# Patient Record
Sex: Female | Born: 1970
Health system: Southern US, Community
[De-identification: ages and names within clinical notes are randomized; demographics above are authoritative.]

## PROBLEM LIST (undated history)

## (undated) ENCOUNTER — Inpatient Hospital Stay (HOSPITAL_COMMUNITY): Payer: Self-pay

## (undated) DIAGNOSIS — Z8632 Personal history of gestational diabetes: Secondary | ICD-10-CM

## (undated) DIAGNOSIS — C801 Malignant (primary) neoplasm, unspecified: Secondary | ICD-10-CM

## (undated) DIAGNOSIS — N289 Disorder of kidney and ureter, unspecified: Secondary | ICD-10-CM

## (undated) DIAGNOSIS — IMO0002 Reserved for concepts with insufficient information to code with codable children: Secondary | ICD-10-CM

## (undated) DIAGNOSIS — D689 Coagulation defect, unspecified: Secondary | ICD-10-CM

## (undated) DIAGNOSIS — I2699 Other pulmonary embolism without acute cor pulmonale: Secondary | ICD-10-CM

## (undated) DIAGNOSIS — O24419 Gestational diabetes mellitus in pregnancy, unspecified control: Secondary | ICD-10-CM

## (undated) DIAGNOSIS — E559 Vitamin D deficiency, unspecified: Secondary | ICD-10-CM

## (undated) DIAGNOSIS — D251 Intramural leiomyoma of uterus: Secondary | ICD-10-CM

## (undated) DIAGNOSIS — A048 Other specified bacterial intestinal infections: Secondary | ICD-10-CM

## (undated) DIAGNOSIS — K649 Unspecified hemorrhoids: Secondary | ICD-10-CM

## (undated) DIAGNOSIS — K5900 Constipation, unspecified: Secondary | ICD-10-CM

## (undated) DIAGNOSIS — R87619 Unspecified abnormal cytological findings in specimens from cervix uteri: Secondary | ICD-10-CM

## (undated) HISTORY — DX: Intramural leiomyoma of uterus: D25.1

## (undated) HISTORY — DX: Coagulation defect, unspecified: D68.9

## (undated) HISTORY — PX: CHOLECYSTECTOMY: SHX55

## (undated) HISTORY — DX: Other pulmonary embolism without acute cor pulmonale: I26.99

## (undated) HISTORY — DX: Constipation, unspecified: K59.00

## (undated) HISTORY — DX: Unspecified hemorrhoids: K64.9

## (undated) HISTORY — DX: Vitamin D deficiency, unspecified: E55.9

## (undated) HISTORY — DX: Other specified bacterial intestinal infections: A04.8

## (undated) HISTORY — DX: Personal history of gestational diabetes: Z86.32

---

## 2000-04-03 ENCOUNTER — Encounter: Payer: Self-pay | Admitting: Emergency Medicine

## 2000-04-03 ENCOUNTER — Emergency Department (HOSPITAL_COMMUNITY): Admission: EM | Admit: 2000-04-03 | Discharge: 2000-04-03 | Payer: Self-pay | Admitting: Emergency Medicine

## 2000-05-12 ENCOUNTER — Inpatient Hospital Stay (HOSPITAL_COMMUNITY): Admission: AD | Admit: 2000-05-12 | Discharge: 2000-05-12 | Payer: Self-pay | Admitting: Obstetrics & Gynecology

## 2000-05-12 ENCOUNTER — Encounter: Payer: Self-pay | Admitting: Obstetrics & Gynecology

## 2000-11-07 ENCOUNTER — Encounter: Payer: Self-pay | Admitting: *Deleted

## 2000-11-07 ENCOUNTER — Inpatient Hospital Stay (HOSPITAL_COMMUNITY): Admission: AD | Admit: 2000-11-07 | Discharge: 2000-11-09 | Payer: Self-pay | Admitting: *Deleted

## 2002-03-12 ENCOUNTER — Emergency Department (HOSPITAL_COMMUNITY): Admission: EM | Admit: 2002-03-12 | Discharge: 2002-03-12 | Payer: Self-pay | Admitting: Emergency Medicine

## 2002-05-19 ENCOUNTER — Emergency Department (HOSPITAL_COMMUNITY): Admission: EM | Admit: 2002-05-19 | Discharge: 2002-05-19 | Payer: Self-pay | Admitting: Emergency Medicine

## 2002-07-13 ENCOUNTER — Emergency Department (HOSPITAL_COMMUNITY): Admission: EM | Admit: 2002-07-13 | Discharge: 2002-07-13 | Payer: Self-pay | Admitting: Emergency Medicine

## 2002-07-28 ENCOUNTER — Emergency Department (HOSPITAL_COMMUNITY): Admission: EM | Admit: 2002-07-28 | Discharge: 2002-07-28 | Payer: Self-pay | Admitting: Emergency Medicine

## 2002-10-31 ENCOUNTER — Inpatient Hospital Stay (HOSPITAL_COMMUNITY): Admission: AD | Admit: 2002-10-31 | Discharge: 2002-10-31 | Payer: Self-pay | Admitting: Obstetrics & Gynecology

## 2002-11-05 ENCOUNTER — Encounter: Admission: RE | Admit: 2002-11-05 | Discharge: 2002-11-05 | Payer: Self-pay | Admitting: Obstetrics & Gynecology

## 2002-11-05 ENCOUNTER — Encounter: Payer: Self-pay | Admitting: Obstetrics & Gynecology

## 2003-07-29 ENCOUNTER — Encounter: Admission: RE | Admit: 2003-07-29 | Discharge: 2003-07-29 | Payer: Self-pay | Admitting: Obstetrics and Gynecology

## 2004-07-21 ENCOUNTER — Other Ambulatory Visit: Admission: RE | Admit: 2004-07-21 | Discharge: 2004-07-21 | Payer: Self-pay | Admitting: Gynecology

## 2006-04-20 ENCOUNTER — Other Ambulatory Visit: Admission: RE | Admit: 2006-04-20 | Discharge: 2006-04-20 | Payer: Self-pay | Admitting: Gynecology

## 2007-08-27 ENCOUNTER — Inpatient Hospital Stay (HOSPITAL_COMMUNITY): Admission: AD | Admit: 2007-08-27 | Discharge: 2007-08-27 | Payer: Self-pay | Admitting: Gynecology

## 2007-12-18 ENCOUNTER — Inpatient Hospital Stay (HOSPITAL_COMMUNITY): Admission: AD | Admit: 2007-12-18 | Discharge: 2007-12-19 | Payer: Self-pay | Admitting: Obstetrics

## 2007-12-31 ENCOUNTER — Inpatient Hospital Stay (HOSPITAL_COMMUNITY): Admission: AD | Admit: 2007-12-31 | Discharge: 2007-12-31 | Payer: Self-pay | Admitting: Obstetrics

## 2008-01-23 ENCOUNTER — Emergency Department (HOSPITAL_COMMUNITY): Admission: EM | Admit: 2008-01-23 | Discharge: 2008-01-23 | Payer: Self-pay | Admitting: Family Medicine

## 2008-01-26 ENCOUNTER — Emergency Department (HOSPITAL_COMMUNITY): Admission: EM | Admit: 2008-01-26 | Discharge: 2008-01-26 | Payer: Self-pay | Admitting: Emergency Medicine

## 2008-03-18 ENCOUNTER — Inpatient Hospital Stay (HOSPITAL_COMMUNITY): Admission: AD | Admit: 2008-03-18 | Discharge: 2008-03-20 | Payer: Self-pay | Admitting: Obstetrics

## 2008-11-12 ENCOUNTER — Emergency Department (HOSPITAL_COMMUNITY): Admission: EM | Admit: 2008-11-12 | Discharge: 2008-11-12 | Payer: Self-pay | Admitting: Family Medicine

## 2009-07-23 ENCOUNTER — Emergency Department (HOSPITAL_COMMUNITY): Admission: EM | Admit: 2009-07-23 | Discharge: 2009-07-23 | Payer: Self-pay | Admitting: Family Medicine

## 2010-03-01 ENCOUNTER — Emergency Department (HOSPITAL_COMMUNITY): Admission: EM | Admit: 2010-03-01 | Discharge: 2010-03-01 | Payer: Self-pay | Admitting: Emergency Medicine

## 2010-03-24 ENCOUNTER — Inpatient Hospital Stay (HOSPITAL_COMMUNITY)
Admission: AD | Admit: 2010-03-24 | Discharge: 2010-03-24 | Payer: Self-pay | Source: Home / Self Care | Admitting: Obstetrics and Gynecology

## 2010-03-30 ENCOUNTER — Encounter
Admission: RE | Admit: 2010-03-30 | Discharge: 2010-03-30 | Payer: Self-pay | Source: Home / Self Care | Attending: Obstetrics and Gynecology | Admitting: Obstetrics and Gynecology

## 2010-04-09 ENCOUNTER — Ambulatory Visit: Payer: Self-pay | Admitting: Obstetrics & Gynecology

## 2010-06-28 LAB — URINALYSIS, ROUTINE W REFLEX MICROSCOPIC
Bilirubin Urine: NEGATIVE
Glucose, UA: NEGATIVE mg/dL
Ketones, ur: 15 mg/dL — AB
Leukocytes, UA: NEGATIVE
Nitrite: NEGATIVE
Protein, ur: NEGATIVE mg/dL
Specific Gravity, Urine: 1.025 (ref 1.005–1.030)
Urobilinogen, UA: 0.2 mg/dL (ref 0.0–1.0)
pH: 6 (ref 5.0–8.0)

## 2010-06-28 LAB — URINE MICROSCOPIC-ADD ON

## 2010-06-28 LAB — POCT PREGNANCY, URINE: Preg Test, Ur: NEGATIVE

## 2010-11-18 ENCOUNTER — Inpatient Hospital Stay (INDEPENDENT_AMBULATORY_CARE_PROVIDER_SITE_OTHER)
Admission: RE | Admit: 2010-11-18 | Discharge: 2010-11-18 | Disposition: A | Discharge status: Home / Self Care | Payer: Self-pay | Source: Ambulatory Visit | Attending: Family Medicine | Admitting: Family Medicine

## 2010-11-18 DIAGNOSIS — L259 Unspecified contact dermatitis, unspecified cause: Secondary | ICD-10-CM

## 2010-12-24 ENCOUNTER — Inpatient Hospital Stay (INDEPENDENT_AMBULATORY_CARE_PROVIDER_SITE_OTHER)
Admission: RE | Admit: 2010-12-24 | Discharge: 2010-12-24 | Disposition: A | Discharge status: Home / Self Care | Payer: Self-pay | Source: Ambulatory Visit | Attending: Family Medicine | Admitting: Family Medicine

## 2010-12-24 DIAGNOSIS — J069 Acute upper respiratory infection, unspecified: Secondary | ICD-10-CM

## 2011-01-12 LAB — URINALYSIS, ROUTINE W REFLEX MICROSCOPIC
Bilirubin Urine: NEGATIVE
Glucose, UA: NEGATIVE
Ketones, ur: NEGATIVE
Nitrite: NEGATIVE
Protein, ur: NEGATIVE
Specific Gravity, Urine: 1.025
Urobilinogen, UA: 0.2
pH: 6

## 2011-01-12 LAB — CBC
HCT: 35.8 — ABNORMAL LOW
Hemoglobin: 12.4
MCHC: 34.4
MCV: 90.4
Platelets: 264
RBC: 3.96
RDW: 12.9
WBC: 10.6 — ABNORMAL HIGH

## 2011-01-12 LAB — WET PREP, GENITAL
Clue Cells Wet Prep HPF POC: NONE SEEN
Trich, Wet Prep: NONE SEEN
Yeast Wet Prep HPF POC: NONE SEEN

## 2011-01-12 LAB — URINE MICROSCOPIC-ADD ON

## 2011-01-12 LAB — GC/CHLAMYDIA PROBE AMP, GENITAL
Chlamydia, DNA Probe: NEGATIVE
GC Probe Amp, Genital: NEGATIVE

## 2011-01-12 LAB — POCT PREGNANCY, URINE
Operator id: 222261
Preg Test, Ur: POSITIVE

## 2011-01-17 LAB — POCT RAPID STREP A: Streptococcus, Group A Screen (Direct): NEGATIVE

## 2011-01-20 LAB — CBC
HCT: 32.3 % — ABNORMAL LOW (ref 36.0–46.0)
HCT: 39.6 % (ref 36.0–46.0)
Hemoglobin: 11.1 g/dL — ABNORMAL LOW (ref 12.0–15.0)
Hemoglobin: 13.2 g/dL (ref 12.0–15.0)
MCHC: 33.2 g/dL (ref 30.0–36.0)
MCHC: 34.2 g/dL (ref 30.0–36.0)
MCV: 90.3 fL (ref 78.0–100.0)
MCV: 90.5 fL (ref 78.0–100.0)
Platelets: 145 10*3/uL — ABNORMAL LOW (ref 150–400)
Platelets: 163 10*3/uL (ref 150–400)
RBC: 3.58 MIL/uL — ABNORMAL LOW (ref 3.87–5.11)
RBC: 4.38 MIL/uL (ref 3.87–5.11)
RDW: 16.6 % — ABNORMAL HIGH (ref 11.5–15.5)
RDW: 16.7 % — ABNORMAL HIGH (ref 11.5–15.5)
WBC: 10.9 10*3/uL — ABNORMAL HIGH (ref 4.0–10.5)
WBC: 11.9 10*3/uL — ABNORMAL HIGH (ref 4.0–10.5)

## 2011-01-20 LAB — RPR: RPR Ser Ql: NONREACTIVE

## 2011-03-09 ENCOUNTER — Inpatient Hospital Stay (HOSPITAL_COMMUNITY)
Admission: AD | Admit: 2011-03-09 | Discharge: 2011-03-09 | Disposition: A | Discharge status: Home / Self Care | Payer: Self-pay | Source: Ambulatory Visit | Attending: Obstetrics and Gynecology | Admitting: Obstetrics and Gynecology

## 2011-03-09 ENCOUNTER — Encounter (HOSPITAL_COMMUNITY): Payer: Self-pay | Admitting: *Deleted

## 2011-03-09 DIAGNOSIS — N760 Acute vaginitis: Secondary | ICD-10-CM | POA: Insufficient documentation

## 2011-03-09 DIAGNOSIS — B373 Candidiasis of vulva and vagina: Secondary | ICD-10-CM

## 2011-03-09 DIAGNOSIS — N949 Unspecified condition associated with female genital organs and menstrual cycle: Secondary | ICD-10-CM | POA: Insufficient documentation

## 2011-03-09 HISTORY — DX: Reserved for concepts with insufficient information to code with codable children: IMO0002

## 2011-03-09 HISTORY — DX: Unspecified abnormal cytological findings in specimens from cervix uteri: R87.619

## 2011-03-09 LAB — URINE MICROSCOPIC-ADD ON

## 2011-03-09 LAB — WET PREP, GENITAL
Clue Cells Wet Prep HPF POC: NONE SEEN
Trich, Wet Prep: NONE SEEN
Yeast Wet Prep HPF POC: NONE SEEN

## 2011-03-09 LAB — URINALYSIS, ROUTINE W REFLEX MICROSCOPIC
Bilirubin Urine: NEGATIVE
Glucose, UA: NEGATIVE mg/dL
Ketones, ur: NEGATIVE mg/dL
Leukocytes, UA: NEGATIVE
Nitrite: NEGATIVE
Protein, ur: NEGATIVE mg/dL
Specific Gravity, Urine: >1.03 — ABNORMAL HIGH (ref 1.005–1.030)
Urobilinogen, UA: 0.2 mg/dL (ref 0.0–1.0)
pH: 5.5 (ref 5.0–8.0)

## 2011-03-09 LAB — POCT PREGNANCY, URINE: Preg Test, Ur: NEGATIVE

## 2011-03-09 MED ORDER — FLUCONAZOLE 150 MG PO TABS: 150.0000 mg | ORAL_TABLET | Freq: Once | ORAL | Status: AC

## 2011-03-09 NOTE — Progress Notes (Signed)
Denies n/v/d/c.  Some vaginal itching.no pain with urination.

## 2011-03-09 NOTE — ED Provider Notes (Signed)
History     Chief Complaint  Patient presents with  . Vaginal Pain   HPI 40 y.o. female with vaginal pain and itching x several days. Tried Monistat 1 day course, helped a little, but symptoms returned.   OB History    Grav Para Term Preterm Abortions TAB SAB Ect Mult Living   4 4 4       4       Past Medical History  Diagnosis Date  . Asthma   . Abnormal Pap smear     patient states she had cancer that was removed from her cervix    History reviewed. No pertinent past surgical history.  History reviewed. No pertinent family history.  History  Substance Use Topics  . Smoking status: Never Smoker   . Smokeless tobacco: Never Used  . Alcohol Use: Yes    Allergies: No Known Allergies  Prescriptions prior to admission  Medication Sig Dispense Refill  . acetaminophen (TYLENOL) 500 MG tablet Take 500 mg by mouth every 6 (six) hours as needed. For pain         Review of Systems  Constitutional: Negative.   Respiratory: Negative.   Cardiovascular: Negative.   Gastrointestinal: Negative for nausea, vomiting, abdominal pain, diarrhea and constipation.  Genitourinary: Negative for dysuria, urgency, frequency, hematuria and flank pain.       Negative for vaginal bleeding, Positive for vaginal discharge and pain  Musculoskeletal: Negative.   Neurological: Negative.   Psychiatric/Behavioral: Negative.    Physical Exam   Blood pressure 128/95, pulse 63, temperature 99.1 F (37.3 C), temperature source Oral, resp. rate 20, height 5\' 6"  (1.676 m), weight 90.266 kg (199 lb), last menstrual period 02/17/2011.  Physical Exam  Nursing note and vitals reviewed. Constitutional: She is oriented to person, place, and time. She appears well-developed and well-nourished. No distress.  Respiratory: Effort normal.  GI: Soft. She exhibits no distension. There is no tenderness.  Genitourinary: Uterus is not enlarged and not tender. Cervix exhibits no discharge. Right adnexum displays no  mass and no tenderness. Left adnexum displays no mass and no tenderness. No bleeding around the vagina. Vaginal discharge (white) found.  Musculoskeletal: Normal range of motion.  Neurological: She is alert and oriented to person, place, and time.  Skin: Skin is warm and dry.  Psychiatric: She has a normal mood and affect.    MAU Course  Procedures  Results for orders placed during the hospital encounter of 03/09/11 (from the past 24 hour(s))  URINALYSIS, ROUTINE W REFLEX MICROSCOPIC     Status: Abnormal   Collection Time   03/09/11  6:00 PM      Component Value Range   Color, Urine YELLOW  YELLOW    Appearance CLEAR  CLEAR    Specific Gravity, Urine >1.030 (*) 1.005 - 1.030    pH 5.5  5.0 - 8.0    Glucose, UA NEGATIVE  NEGATIVE (mg/dL)   Hgb urine dipstick TRACE (*) NEGATIVE    Bilirubin Urine NEGATIVE  NEGATIVE    Ketones, ur NEGATIVE  NEGATIVE (mg/dL)   Protein, ur NEGATIVE  NEGATIVE (mg/dL)   Urobilinogen, UA 0.2  0.0 - 1.0 (mg/dL)   Nitrite NEGATIVE  NEGATIVE    Leukocytes, UA NEGATIVE  NEGATIVE   URINE MICROSCOPIC-ADD ON     Status: Abnormal   Collection Time   03/09/11  6:00 PM      Component Value Range   Squamous Epithelial / LPF FEW (*) RARE    WBC, UA  3-6  <3 (WBC/hpf)   Bacteria, UA FEW (*) RARE   POCT PREGNANCY, URINE     Status: Normal   Collection Time   03/09/11  6:44 PM      Component Value Range   Preg Test, Ur NEGATIVE    WET PREP, GENITAL     Status: Abnormal   Collection Time   03/09/11 10:37 PM      Component Value Range   Yeast, Wet Prep NONE SEEN  NONE SEEN    Trich, Wet Prep NONE SEEN  NONE SEEN    Clue Cells, Wet Prep NONE SEEN  NONE SEEN    WBC, Wet Prep HPF POC FEW (*) NONE SEEN      Assessment and Plan  Vaginitis - will treat for candida considering symptoms and partial resolution of symptoms with 1 day monistat, rx diflucan Encouraged f/u in pap clinic d/t history of abnormal pap - no pap in 2 years  Hendy Brindle 03/09/2011,  10:36 PM

## 2011-03-09 NOTE — Progress Notes (Signed)
Pain in vagina, started about 3 wks ago.  Cramping.. No bleeding, small amt of clear discharge.

## 2011-03-10 ENCOUNTER — Encounter (HOSPITAL_COMMUNITY): Payer: Self-pay | Admitting: Advanced Practice Midwife

## 2011-03-10 LAB — GC/CHLAMYDIA PROBE AMP, GENITAL
Chlamydia, DNA Probe: NEGATIVE
GC Probe Amp, Genital: NEGATIVE

## 2011-03-14 NOTE — ED Provider Notes (Signed)
Attestation of Attending Supervision of Advanced Practitioner: Evaluation and management procedures were performed by the PA/NP/CNM/OB Fellow under my supervision/collaboration. Chart reviewed and agree with management and plan.  Tilda Burrow 03/14/2011 6:35 PM

## 2011-04-11 ENCOUNTER — Encounter (HOSPITAL_COMMUNITY): Payer: Self-pay | Admitting: *Deleted

## 2011-04-11 ENCOUNTER — Emergency Department (INDEPENDENT_AMBULATORY_CARE_PROVIDER_SITE_OTHER)
Admission: EM | Admit: 2011-04-11 | Discharge: 2011-04-11 | Disposition: A | Discharge status: Home / Self Care | Payer: Self-pay | Source: Home / Self Care | Attending: Emergency Medicine | Admitting: Emergency Medicine

## 2011-04-11 DIAGNOSIS — Z77098 Contact with and (suspected) exposure to other hazardous, chiefly nonmedicinal, chemicals: Secondary | ICD-10-CM

## 2011-04-11 DIAGNOSIS — R059 Cough, unspecified: Secondary | ICD-10-CM

## 2011-04-11 DIAGNOSIS — R05 Cough: Secondary | ICD-10-CM

## 2011-04-11 MED ORDER — ARTIFICIAL TEAR OINTMENT OP OINT: 1.0000 [drp] | TOPICAL_OINTMENT | Freq: Three times a day (TID) | OPHTHALMIC | Status: DC

## 2011-04-11 MED ORDER — PREDNISONE 20 MG PO TABS: 40.0000 mg | ORAL_TABLET | Freq: Every day | ORAL | Status: AC

## 2011-04-11 NOTE — ED Notes (Signed)
Pt  Reports  Sinus  Pressure     Sinus  /  Nose  Stuffy  And  painfull  Eyes  Hurt  When she bends       She  Has  Been  Instilling  Eye  Drops  For the  Symptoms  As  Well    -  The  Pt  Reports  The  Symptoms  X   3  Days

## 2011-04-11 NOTE — ED Notes (Signed)
Pt only speaks spanish

## 2011-04-11 NOTE — ED Provider Notes (Signed)
History     CSN: [id number]  Arrival date & time 04/11/11  1056   First MD Initiated Contact with Patient 04/11/11 1121      Chief Complaint  Patient presents with  . Facial Pain    (Consider location/radiation/quality/duration/timing/severity/associated sxs/prior treatment) HPI Comments: Chest feels sore, been coughing a lot, by accident I inhaled Clorox and started coughing a lot, that was as 3 days ago" the cough is better, NO SOB No fevers. No congestion "but Im sore all over my upper back and chest" also My r eye was feeling sore, its better today but tears a bit"  The history is provided by the patient.    Past Medical History  Diagnosis Date  . Asthma   . Abnormal Pap smear     patient states she had cancer that was removed from her cervix    History reviewed. No pertinent past surgical history.  History reviewed. No pertinent family history.  History  Substance Use Topics  . Smoking status: Never Smoker   . Smokeless tobacco: Never Used  . Alcohol Use: Yes    OB History    Grav Para Term Preterm Abortions TAB SAB Ect Mult Living   4 4 4       4       Review of Systems  Constitutional: Negative for fever and fatigue.  HENT: Negative for neck stiffness.   Eyes: Positive for discharge and itching. Negative for photophobia, redness and visual disturbance.  Respiratory: Positive for cough. Negative for apnea, chest tightness and wheezing.   Cardiovascular: Negative for palpitations and leg swelling.    Allergies  Review of patient's allergies indicates no known allergies.  Home Medications   Current Outpatient Rx  Name Route Sig Dispense Refill  . ACETAMINOPHEN 500 MG PO TABS Oral Take 500 mg by mouth every 6 (six) hours as needed. For pain     . ARTIFICIAL TEAR OINTMENT OP OINT Ophthalmic Apply 1 drop to eye 3 (three) times daily. 1 each 0  . PREDNISONE 20 MG PO TABS Oral Take 2 tablets (40 mg total) by mouth daily. 10 tablet 0    BP 101/64  Pulse  68  Temp(Src) 98.2 F (36.8 C) (Oral)  Resp 20  SpO2 100%  LMP 04/10/2011  Physical Exam  Nursing note and vitals reviewed. Constitutional: She appears well-developed and well-nourished. No distress.  HENT:  Head: Normocephalic.  Mouth/Throat: Oropharyngeal exudate present.  Eyes: Conjunctivae and EOM are normal. Pupils are equal, round, and reactive to light. No foreign bodies found. Right eye exhibits no chemosis, no discharge, no exudate and no hordeolum. No foreign body present in the right eye. Left eye exhibits no chemosis, no discharge, no exudate and no hordeolum. No foreign body present in the left eye. No scleral icterus.  Pulmonary/Chest: Breath sounds normal. No respiratory distress. She has no decreased breath sounds. She has no wheezes. She has no rhonchi. She has no rales.  Skin: Skin is intact. No abrasion noted.  Psychiatric: She has a normal mood and affect. Her speech is normal.    ED Course  Procedures (including critical care time)  Labs Reviewed - No data to display No results found.   1. Chemical exposure   2. Cough       MDM  Inhaled Clorox while cleaning, coughed a lot- presents with chest wall discomfort and R eye irritataion without visual changes-        Jimmie Molly, MD 04/11/11 1357

## 2011-04-11 NOTE — ED Notes (Signed)
Pt  Reports  A few  Days  Ago  Pt  Mat y  Have     Inhaled  And  Was  Irritated  By  A  Chemical  Cleaning       Fumes       Possibly  Causing  The  Eye  And  Nasal  Irritation

## 2011-09-05 ENCOUNTER — Emergency Department (INDEPENDENT_AMBULATORY_CARE_PROVIDER_SITE_OTHER): Payer: Self-pay

## 2011-09-05 ENCOUNTER — Encounter (HOSPITAL_COMMUNITY): Payer: Self-pay

## 2011-09-05 ENCOUNTER — Emergency Department (INDEPENDENT_AMBULATORY_CARE_PROVIDER_SITE_OTHER)
Admission: EM | Admit: 2011-09-05 | Discharge: 2011-09-05 | Disposition: A | Discharge status: Home / Self Care | Payer: Self-pay | Source: Home / Self Care | Attending: Family Medicine | Admitting: Family Medicine

## 2011-09-05 DIAGNOSIS — S93401A Sprain of unspecified ligament of right ankle, initial encounter: Secondary | ICD-10-CM

## 2011-09-05 DIAGNOSIS — S93409A Sprain of unspecified ligament of unspecified ankle, initial encounter: Secondary | ICD-10-CM

## 2011-09-05 MED ORDER — TRAMADOL HCL 50 MG PO TABS: 50.0000 mg | ORAL_TABLET | Freq: Four times a day (QID) | ORAL | Status: AC | PRN

## 2011-09-05 MED ORDER — TRAMADOL HCL 50 MG PO TABS: 50.0000 mg | ORAL_TABLET | Freq: Four times a day (QID) | ORAL | Status: DC | PRN

## 2011-09-05 MED ORDER — IBUPROFEN 600 MG PO TABS: 600.0000 mg | ORAL_TABLET | Freq: Three times a day (TID) | ORAL | Status: AC

## 2011-09-05 NOTE — Discharge Instructions (Signed)
No fracturas en sus rayos X. Tome los medicamentos como se le ha indicado. Tome ibuprofeno con las comidas pues puede irritar su estomago. Tramadol puede darle sueo y usted no debe manejar despues de tomar [employer]. Mantenga el brazalete quiteselo para dormir.  Empiece a hacer los ejercicios de rehabilitacion tan pronto como mejore su dolor. Vaya con el orthopeda numero arriba si dolor persistent despues de Teacher, English as a foreign language.

## 2011-09-05 NOTE — ED Notes (Signed)
Pt states she was walking and twisted her rt ankle this am.  C/o pain in rt ankle and to plantar surface of rt heel.

## 2011-09-06 NOTE — ED Provider Notes (Signed)
History     CSN: [id number]  Arrival date & time 09/05/11  4098   First MD Initiated Contact with Patient 09/05/11 1950      Chief Complaint  Patient presents with  . Foot Pain    (Consider location/radiation/quality/duration/timing/severity/associated sxs/prior treatment) HPI Comments: 41 y/o female here c/o right ankle pain after twisting ankle in valgus position while walking earlier today. Has noted swelling and ankle and heel painful to put weight on it.    Past Medical History  Diagnosis Date  . Asthma   . Abnormal Pap smear     patient states she had cancer that was removed from her cervix    History reviewed. No pertinent past surgical history.  No family history on file.  History  Substance Use Topics  . Smoking status: Never Smoker   . Smokeless tobacco: Never Used  . Alcohol Use: No    OB History    Grav Para Term Preterm Abortions TAB SAB Ect Mult Living   4 4 4       4       Review of Systems  Constitutional:       10 systems reviewed and  pertinent negative and positive symptoms are as per HPI.     Musculoskeletal:       As per HPI  All other systems reviewed and are negative.    Allergies  Review of patient's allergies indicates no known allergies.  Home Medications   Current Outpatient Rx  Name Route Sig Dispense Refill  . ACETAMINOPHEN 500 MG PO TABS Oral Take 500 mg by mouth every 6 (six) hours as needed. For pain     . ARTIFICIAL TEAR OINTMENT OP OINT Ophthalmic Apply 1 drop to eye 3 (three) times daily. 1 each 0  . IBUPROFEN 600 MG PO TABS Oral Take 1 tablet (600 mg total) by mouth 3 (three) times daily. 30 tablet 0  . TRAMADOL HCL 50 MG PO TABS Oral Take 1 tablet (50 mg total) by mouth every 6 (six) hours as needed for pain. 15 tablet 0    BP 115/73  Pulse 56  Temp(Src) 97.9 F (36.6 C) (Oral)  Resp 16  SpO2 100%  LMP 08/22/2011  Physical Exam  Nursing note and vitals reviewed. Constitutional: She is oriented to person,  place, and time. She appears well-developed and well-nourished. No distress.  HENT:  Head: Normocephalic and atraumatic.  Cardiovascular: Normal heart sounds.   Pulmonary/Chest: Breath sounds normal.  Musculoskeletal:       Right ankle with mild swelling around lateral malleoli. No bruising or ecchymosis. Weight bearing but reported discomfort. Pain with palpation at heel pad. No swelling, redness, or wounds.   Neurological: She is alert and oriented to person, place, and time.    ED Course  Procedures (including critical care time)  Labs Reviewed - No data to display Dg Ankle Complete Right  09/05/2011  *RADIOLOGY REPORT*  Clinical Data: Larey Seat.  Twisted ankle.  Pain in the lateral ankle and bottom of foot.  RIGHT ANKLE - COMPLETE 3+ VIEW  Comparison: None.  Findings: There is no evidence for acute fracture or dislocation. No soft tissue foreign body or gas identified.  The mortise is intact. Note is made of a small plantar calcaneal spur.  IMPRESSION:  1. No evidence for acute  abnormality. 2.  Small plantar calcaneal spur  Original Report Authenticated By: Patterson Hammersmith, M.D.     1. Ankle sprain, right, initial encounter  MDM  Treated symptomatically. Ankle brace and rehab exercises instructions also provided.        Sharin Grave, MD 09/06/11 1459

## 2011-11-11 ENCOUNTER — Encounter: Payer: Self-pay | Admitting: Family Medicine

## 2011-11-11 ENCOUNTER — Ambulatory Visit (INDEPENDENT_AMBULATORY_CARE_PROVIDER_SITE_OTHER): Payer: Self-pay | Admitting: Family Medicine

## 2011-11-11 VITALS — BP 113/81 | HR 83 | Temp 98.0°F | Ht 60.75 in | Wt 188.5 lb

## 2011-11-11 DIAGNOSIS — Z8742 Personal history of other diseases of the female genital tract: Secondary | ICD-10-CM

## 2011-11-11 DIAGNOSIS — E669 Obesity, unspecified: Secondary | ICD-10-CM

## 2011-11-11 DIAGNOSIS — Z87898 Personal history of other specified conditions: Secondary | ICD-10-CM

## 2011-11-11 DIAGNOSIS — M25569 Pain in unspecified knee: Secondary | ICD-10-CM

## 2011-11-11 DIAGNOSIS — M25561 Pain in right knee: Secondary | ICD-10-CM | POA: Insufficient documentation

## 2011-11-11 MED ORDER — IBUPROFEN 600 MG PO TABS: 600.0000 mg | ORAL_TABLET | Freq: Three times a day (TID) | ORAL | Status: AC | PRN

## 2011-11-11 NOTE — Progress Notes (Signed)
  Subjective:    Patient ID: Kim [name], female    DOB: 04-04-1971, 41 y.o.   MRN: [id number]  HPI New patient to establish care.  1. Knee pain. Patient has right knee medial pain for the past 2 months. States her problem began treatments ago when she fell while spraining her ankle and flexing the knee. She was seen in urgent care and ankle x-ray was negative. Her ankle has healed however she developed some medial knee pain that is felt after walking for extended distances. Has intermittent effusion after long walking. Has tried Tylenol. Denies any instability, warmth, fever, current symptoms.   2. cervical dysplasia. Patient states she had a laser therapy on her cervix for abnormal Pap in [place] in 2000. States she had her last normal Pap one month [facility] [place] one month ago. Denies any abnormal vaginal bleeding.  Past Medical History  Diagnosis Date  . Asthma   . Abnormal Pap smear     patient states she had cancer that was removed from her cervix   History reviewed. No pertinent past surgical history. History   Social History  . Marital Status: Married    Spouse Name: [name]    Number of Children: [name]  . Years of Education: [name]   Occupational History  . Not on file.   Social History Main Topics  . Smoking status: Never Smoker   . Smokeless tobacco: Never Used  . Alcohol Use: Yes     1-2/month  . Drug Use: No  . Sexually Active: Yes   Other Topics Concern  . Not on file   Social History Narrative   Married with four children. Came to [place] from [place] in 2001. Unemployed currently. Spanish speaking.   Family History  Problem Relation Age of Onset  . Diabetes Mother   . Hypertension Mother   . Hyperlipidemia Father   . Diabetes Father   . Hypertension Father   . Heart disease Sister   . Diabetes Brother   . Hyperlipidemia Brother   . Drug abuse Paternal Grandmother     Review of Systems See HPI otherwise negative.      Objective:   Physical Exam  Vitals reviewed. Constitutional: She is oriented to person, place, and time. She appears well-developed and well-nourished. No distress.  HENT:  Head: Normocephalic and atraumatic.  Mouth/Throat: Oropharynx is clear and moist.  Eyes: EOM are normal. Pupils are equal, round, and reactive to light.  Neck: Neck supple. No thyromegaly present.  Cardiovascular: Normal rate, regular rhythm and normal heart sounds.   No murmur heard. Pulmonary/Chest: Effort normal and breath sounds normal. No respiratory distress. She has no wheezes. She has no rales.  Abdominal: Soft. Bowel sounds are normal. She exhibits no distension. There is no tenderness. There is no rebound and no guarding.  Musculoskeletal: She exhibits tenderness. She exhibits no edema.       Right knee with medial joint line tenderness. Range of motion intact. No effusion noted. McMurray sign positive medially. No ligamentous instability noted. Patella within normal limits.  Neurological: She is alert and oriented to person, place, and time. Coordination normal.  Skin: No rash noted.  Psychiatric: She has a normal mood and affect.       Assessment & Plan:

## 2011-11-11 NOTE — Patient Instructions (Addendum)
Nice to meet you. Make sure to get orange card. You can take tylenol or ibuprofen 600mg  twice daily as needed for knee pain. You should get a knee brace to wear daily and while walking. You may ice the knee after walking. Make an appointment for check up in one month.  Dolor en la rodilla (Knee Pain) La rodilla es la articulacin compleja entre el muslo y la parte inferior de la pierna. En esta articulacin hay huesos, tendones, ligamentos y TEFL teacher. Los huesos que forman la rodilla son:  El fmur en el muslo.   La tibia y el peron en la pierna.   La rtula montada en la ranura de la parte inferior del muslo.  CAUSAS El dolor de rodilla es una causa frecuente de [place] y puede tener varias causas. Algunas son:  Lesiones como:   Ruptura de ligamento o lesin en el tendn.   Esguince del Armed forces operational officer como:   Gota   Artritis   Infecciones   Uso excesivo, demasiado entrenamiento o mucha actividad fsica.  El dolor de rodilla puede ser leve o intenso. Puede acompaar una lesin debilitante. Los problemas leves con frecuencia responden bien a tratamientos caseros o se mejoran por s mismas. Las lesiones ms graves pueden requerir la intervencin del mdico y [place]. SNTOMAS La rodilla es una articulacin compleja. Los sntomas pueden variar ampliamente Algunos son:  Dolor con el movimiento o al soportar peso.   Hinchazn y Engineer, mining.   Torsin de la rodilla.   Imposibilidad para estirar la rodilla.   La rodilla se traba y no puede enderezarla.   Siente calor y se observa enrojecimiento con dolor y [place].   Deformidad o dislocacin de la rtula.  DIAGNSTICO Determinar cual es el problema puede ser bastante simple, como cuando hay una lesin. Tambin puede ser [employer] debido a la complejidad de la rodilla. Las pruebas para Education officer, environmental un diagnstico son:  Marily Memos y examen fsico por parte del mdico.   Radiografas para descartar otros  problemas. Las radiografas no mostrarn la ruptura del TEFL teacher. Algunas lesiones en la rodilla pueden diagnosticarse del siguiente modo:   La artroscopia es una tcnica quirrgica por la que una pequea cmara de vdeo se inserta en pequeas incisiones que se hacen a los lados de la rodilla. Este procedimiento se [place] para examinar y Therapist, sports los problemas de la articulacin interna de la rodilla. Se utilizan pequeos instrumentos para reparar el cartlago roto (meniscos).   La artrografa es una tcnica radiolgica. Se inyecta un lquido de contraste en la articulacin de la rodilla. Las estructuras internas de la articulacin de la rodilla se hacen visibles en una pelcula de rayos X.   Las imgenes por resonancia magntica son un procedimiento en el que los campos magnticos y una computadora producen imgenes en dos o tres dimensiones del interior de la rodilla. La ruptura del cartlago es visible con esta tcnica. La resonancia magntica ha reemplazado a la artrografa en el diagnstico de la ruptura del cartlago de la rodilla.   Anlisis de [place].   Examen del lquido que lubrica la articulacin de la rodilla (lquido sinovial). Se realiza tomando [place] con [place].  TRATAMIENTO El tratamiento de los problemas de la rodilla depende fundamentalmente de la causa. Algunos de estos tratamientos son:  Segn sea la lesin, un yeso o entablillado, ciruga o fisioterapia.   Permtase el tiempo adecuado de recuperacin. No use demasiado su extremidad lesionada. Si siente dolor durante  los ejercicios de rutina, suspndalos. Hgalos ms lentos o realice menos repeticiones.   En el caso de actividades repetitivas como andar en bicicleta o correr, mantenga la fuerza y Neomia Dear buena nutricin.   Alterne los grupos musculares. Por ejemplo, si levanta pesas, trabaje la parte superior del cuerpo Civil engineer, contracting, y la parte inferior al da siguiente.   Ni los msculos firmes ni los dbiles  proporcionan un sostn adecuado a la rodilla. Los msculos no absorben el estrs que se ejerce sobre la articulacin de la rodilla. Mantenga fuertes los msculos que rodean a la rodilla.   Cudese de los problemas mecnicos:   Si tiene pie plano, los zapatos ortopdicos o especiales pueden ayudar. Comunquese con el profesional que lo asiste si necesita ayuda adicional.   Los soporte de arco con bordes en la zona interna o interna del taco pueden ayudar. Cambian la presin del lado de la rodilla ms comprometido por la osteoartritis.   Podrn colocarle una ortesis de rodilla para aliviar la presin en la zona ms artrtica de la rodilla.   Si el profesional le ha prescripto muletas, ortesis, un vendaje o hielo, hgalo segn las indicaciones. El acrnimo para este tratamiento es PRICE. Significa proteccin, reposo, hielo, compresin y elevacin.   Los antiinflamatorios no esteroides, pueden ayudar a Engineer, materials. Pero si se toman inmediatamente luego de la lesin, podran aumentar la hinchazn. Tome los corticoides luego de Clinical cytogeneticist. Suspndalos si tiene problemas estomacales. No los tome si tiene una historia de Occupational hygienist, Engineer, mining en el estmago o hemorragia intestinal. No lo tome sin la aprobacin del profesional que la asiste si tiene problemas de retencin de lquidos, insuficencia cardaca o problemas renales.   En los casos crnicos, la fisioterapia puede ser de [place].   La glucosamina y el condroitin son suplementos dietarios de Sales promotion account executive. Ambos pueden Engineer, materials de la osteoartritis de la rodilla. Estos medicamentos son diferentes de los antiinflamatorios habituales. La glucosamina puede disminuir el porcentaje de destruccin del cartlago.   Las inyecciones de corticoides en la articulacin de la rodilla reducen los sntomas de un brote de artritis. Ofrecen alivio que dura algunos meses. Hay que esperar algunos meses entre la aplicacin de inyecciones. Las inyecciones tiene un  pequeo riesgo de infeccin, retencin de lquidos y Engineer, structural de los niveles de Production assistant, radio.   El cido hialurnico inyectado en las articulaciones lesionadas puede aliviar el dolor y proporciona lubricacin. Estas inyecciones funcionan bien reduciendo la inflamacin. Una serie de inyecciones puede proporcionar alivio durante seis meses.   Analgsicos locales. Aplicar ciertos ungentos sobre la piel puede ayudar a Engineer, materials y la rigidez de la osteoartritis. Consulte con el farmacutico, si es necesario. Muchos medicamentos de venta libre estn aprobados para el alivio temporario del dolor artrtico.   En algunos pases los mdicos prescriben antiinflamatorios no esteroides para el alivio de los trastornos crnicos como la artritis y la tendinitis. Un estudio del tratamiento con antiinflamatorios no esteroides aplicados en crema, demostr que funcionaban bien, as como administrados por va oral, pero sin el peligro de los [place].  PREVENCIN  Mantenga un peso normal. Los kilos de ms agregan tensin a las articulaciones.   Mantngase fuerte y gil. Los msculos dbiles son Neomia Dear causa frecuente de lesiones en la rodilla. La elongacin es importante. Incluya ejercicios de flexibilidad en sus rutinas.   Practique actividad fsica con inteligencia. Si sufre osteoartritis, dolor crnico en la rodilla o lesiones recurrentes, podr ser necesario que modifique el  modo en que se ejercita. No significa que deba volverse inactivo. Si le duelen las rodillas despus de correr o jugar basketball, considere la prctica de la natacin, ejercicios aerbicos en el agua u otras actividades de bajo [place], al menos durante [address] o [employer]. En algunos casos, el [employer] [address] de alto impacto ofrece [place].   Asegrese que sus zapatos le [place]. Elija el calzado deportivo adecuado para su deporte.   Proteja sus rodillas. Use la proteccin adecuada para las actividades  que puedan afectar a sus rodillas. Use rodilleras cuando juegue al vley o se arrodille. Colquese el cinturn de seguridad cada vez que conduzca. La mayor parte de las fracturas de rtula ocurren en accidentes automvilsticos.   Descanse cuando se sienta cansado.  SOLICITE ATENCIN MDICA SI: Tiene dolor en la rodilla que es continuo y no parece mejorar.  SOLICITE ATENCIN MDICA DE INMEDIATO SI:  La articulacin de la rodilla se siente caliente al tacto y usted tiene fiebre. EST SEGURO QUE:   Comprende las instrucciones para el alta mdica.   Controlar su enfermedad.   Solicitar atencin mdica de inmediato segn las indicaciones.  Document Released: 09/21/2007 Document Revised: 03/24/2011 [facility] Patient Information 2012 [place], [place].

## 2011-11-11 NOTE — Assessment & Plan Note (Signed)
Subacute after fall 3 months ago, no effusion today. This could be a simple sprain, but concern for meniscal injury with recurrent effusions and medial joint line pain, + McMurray. No direct injury to suggest fracture or deformity on exam today. Discussed trial of conservative management with NSAIDs, knee brace, quad strengthening. May consider imaging if still problematic at followup in one month.

## 2011-11-11 NOTE — Assessment & Plan Note (Addendum)
Body mass index is 35.91 kg/(m^2). Patient has strong family history diabetes, hypertension, hyperlipidemia. Advised patient to followup for fasting labs once she obtains orange card coverage.

## 2012-01-21 ENCOUNTER — Emergency Department (HOSPITAL_COMMUNITY)
Admission: EM | Admit: 2012-01-21 | Discharge: 2012-01-21 | Disposition: A | Discharge status: Home / Self Care | Payer: Self-pay | Source: Home / Self Care | Attending: Family Medicine | Admitting: Family Medicine

## 2012-01-21 ENCOUNTER — Encounter (HOSPITAL_COMMUNITY): Payer: Self-pay | Admitting: Emergency Medicine

## 2012-01-21 DIAGNOSIS — F410 Panic disorder [episodic paroxysmal anxiety] without agoraphobia: Secondary | ICD-10-CM

## 2012-01-21 MED ORDER — HYDROXYZINE HCL 50 MG PO TABS: 50.0000 mg | ORAL_TABLET | Freq: Three times a day (TID) | ORAL | Status: DC | PRN

## 2012-01-21 NOTE — ED Notes (Signed)
Pt c/o SOB since this morning around 2:00am.... Sx include: left shoulder pain when she inhales, left arm numbness, and left hand swelling, SOB.... Denies: fevers, headaches, vomiting, nausea, family/social stressors, hypertension, heart disease.... She's complaining also of a knot on her throat that makes her not breath well.

## 2012-01-21 NOTE — ED Notes (Signed)
Pt was going to [facility] but due to their closure has not been able to find new PCP

## 2012-01-24 NOTE — ED Provider Notes (Signed)
History     CSN: [id number]  Arrival date & time 01/21/12  1149   First MD Initiated Contact with Patient 01/21/12 1236      Chief Complaint  Patient presents with  . Shortness of Breath    (Consider location/radiation/quality/duration/timing/severity/associated sxs/prior treatment) HPI Comments: 41 y/o female wit h/o anxiety attacks here c/o chest and neck pressure feels knot in neckthat makes it hard to breath, no productive cough. No wheezing. No diaphoresis, no leg swelling. Last time this morning at 2:oo am woke her from sleep. Also difficulty sleeping. Has panic attacks in [place] in the past. Denies chest pain here.    Past Medical History  Diagnosis Date  . Asthma   . Abnormal Pap smear     patient states she had cancer that was removed from her cervix    History reviewed. No pertinent past surgical history.  Family History  Problem Relation Age of Onset  . Diabetes Mother   . Hypertension Mother   . Hyperlipidemia Father   . Diabetes Father   . Hypertension Father   . Heart disease Sister   . Diabetes Brother   . Hyperlipidemia Brother   . Drug abuse Paternal Grandmother     History  Substance Use Topics  . Smoking status: Never Smoker   . Smokeless tobacco: Never Used  . Alcohol Use: Yes     1-2/month    OB History    Grav Para Term Preterm Abortions TAB SAB Ect Mult Living   4 4 4       4       Review of Systems  Constitutional: Negative for fever, chills, appetite change and fatigue.  Respiratory: Positive for shortness of breath.   Cardiovascular: Negative for chest pain and leg swelling.  Gastrointestinal: Negative for nausea, vomiting, abdominal pain and diarrhea.  Genitourinary: Negative for dysuria.  Neurological: Negative for dizziness and headaches.    Allergies  Review of patient's allergies indicates no known allergies.  Home Medications   Current Outpatient Rx  Name Route Sig Dispense Refill  . ACETAMINOPHEN 500 MG PO TABS  Oral Take 500 mg by mouth every 6 (six) hours as needed. For pain     . ARTIFICIAL TEAR OINTMENT OP OINT Ophthalmic Apply 1 drop to eye 3 (three) times daily. 1 each 0  . HYDROXYZINE HCL 50 MG PO TABS Oral Take 1 tablet (50 mg total) by mouth 3 (three) times daily as needed for anxiety. 30 tablet 0    BP 134/83  Pulse 82  Resp 18  SpO2 99%  LMP 01/02/2012  Physical Exam  Nursing note and vitals reviewed. Constitutional: She is oriented to person, place, and time. She appears well-developed and well-nourished. No distress.  HENT:  Head: Normocephalic and atraumatic.  Nose: Nose normal.  Mouth/Throat: Oropharynx is clear and moist. No oropharyngeal exudate.  Eyes: EOM are normal. Pupils are equal, round, and reactive to light. No scleral icterus.  Neck: Normal range of motion. Neck supple. No JVD present. No thyromegaly present.  Cardiovascular: Normal rate, regular rhythm, normal heart sounds and intact distal pulses.  Exam reveals no gallop and no friction rub.   No murmur heard. Pulmonary/Chest: Effort normal and breath sounds normal. No respiratory distress. She has no wheezes. She has no rales. She exhibits no tenderness.  Abdominal: Soft. Bowel sounds are normal. She exhibits no distension. There is no tenderness.  Lymphadenopathy:    She has no cervical adenopathy.  Neurological: She is alert and oriented  to person, place, and time.  Skin: No rash noted.    ED Course  Procedures (including critical care time)  Labs Reviewed - No data to display No results found.   1. Anxiety attack       MDM  EKG: reviewed with NSR, normal rate. No acute ischemic changes.  Patient asymptomatic here. impress panic attacks.  Prescribed hydroxyzine. Asked to go to the ED if recurrent or persistent symptoms. Follow up with Dr Cristal Ford at family practice to monitor symptoms.         Sharin Grave, MD 01/24/12 1303

## 2012-01-25 ENCOUNTER — Encounter (HOSPITAL_COMMUNITY): Payer: Self-pay | Admitting: Emergency Medicine

## 2012-01-25 ENCOUNTER — Emergency Department (HOSPITAL_COMMUNITY): Payer: Self-pay

## 2012-01-25 ENCOUNTER — Emergency Department (HOSPITAL_COMMUNITY)
Admission: EM | Admit: 2012-01-25 | Discharge: 2012-01-26 | Disposition: A | Discharge status: Home / Self Care | Payer: Self-pay | Attending: Emergency Medicine | Admitting: Emergency Medicine

## 2012-01-25 DIAGNOSIS — R0789 Other chest pain: Secondary | ICD-10-CM | POA: Insufficient documentation

## 2012-01-25 LAB — POCT I-STAT TROPONIN I: Troponin i, poc: 0 ng/mL (ref 0.00–0.08)

## 2012-01-25 LAB — BASIC METABOLIC PANEL
BUN: 10 mg/dL (ref 6–23)
CO2: 25 mEq/L (ref 19–32)
Calcium: 8.6 mg/dL (ref 8.4–10.5)
Chloride: 102 mEq/L (ref 96–112)
Creatinine, Ser: 0.54 mg/dL (ref 0.50–1.10)
GFR calc Af Amer: >90 mL/min (ref >90)
GFR calc non Af Amer: >90 mL/min (ref >90)
Glucose, Bld: 110 mg/dL — ABNORMAL HIGH (ref 70–99)
Potassium: 3.4 mEq/L — ABNORMAL LOW (ref 3.5–5.1)
Sodium: 136 mEq/L (ref 135–145)

## 2012-01-25 LAB — CBC
HCT: 37.9 % (ref 36.0–46.0)
Hemoglobin: 13.1 g/dL (ref 12.0–15.0)
MCH: 29.8 pg (ref 26.0–34.0)
MCHC: 34.6 g/dL (ref 30.0–36.0)
MCV: 86.3 fL (ref 78.0–100.0)
Platelets: 263 10*3/uL (ref 150–400)
RBC: 4.39 MIL/uL (ref 3.87–5.11)
RDW: 12.9 % (ref 11.5–15.5)
WBC: 10.3 10*3/uL (ref 4.0–10.5)

## 2012-01-25 NOTE — ED Notes (Signed)
According to pt, started to feel chest pain about 6pm.  Pt describes pain as pressure 8/10.  Pt also reports having SOB and back pain.

## 2012-01-25 NOTE — ED Notes (Signed)
Having chest pressure, and having pain on L side; hurts to take breath; denies n/v, was seen on Saturday at another facility for same and was dx with anxiety and sent home on meds; no relief

## 2012-01-26 MED ORDER — HYDROCODONE-ACETAMINOPHEN 5-500 MG PO TABS: 1.0000 | ORAL_TABLET | Freq: Four times a day (QID) | ORAL | Status: DC | PRN

## 2012-01-26 NOTE — ED Provider Notes (Signed)
History     CSN: [id number]  Arrival date & time 01/25/12  2155   First MD Initiated Contact with Patient 01/26/12 0007      Chief Complaint  Patient presents with  . Chest Pain    (Consider location/radiation/quality/duration/timing/severity/associated sxs/prior treatment) HPI Comments: Patient with 4 day history of chest discomfort.  She reports sharp pains in the anterior left chest that are worse with deep breath and palpation.  She denies cough, fever, or shortness of breath.  [facility] few [facility] and diagnosed with possible anxiety.  She was given hydroxyzine without much relief.  Of note is that the patient speaks very little [place].  The history was taken from the daughter who acts as Nurse, learning disability.  A Level 5 caveat therefore applies.   Patient is a 41 y.o. female presenting with chest pain. The history is provided by the patient.  Chest Pain Episode onset: 4-5 days ago. Chest pain occurs intermittently. The chest pain is unchanged. The pain is associated with breathing (movement, palpation). The severity of the pain is moderate. The quality of the pain is described as sharp. The pain does not radiate. Chest pain is worsened by deep breathing and certain positions (palpation, movement). Pertinent negatives for primary symptoms include no fever, no cough and no nausea. Risk factors include no known risk factors.     Past Medical History  Diagnosis Date  . 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 Asthma     when pregnant    History reviewed. No pertinent past surgical history.  Family History  Problem Relation Age of Onset  . Diabetes Mother   . Hypertension Mother   . Hyperlipidemia Father   . Diabetes Father   . Hypertension Father   . Heart disease Sister   . Diabetes Brother   . Hyperlipidemia Brother   . Drug abuse Paternal Grandmother     History  Substance Use Topics  . Smoking status: Never  Smoker   . Smokeless tobacco: Never Used  . Alcohol Use: Yes     1-2/month    OB History    Grav Para Term Preterm Abortions TAB SAB Ect Mult Living   4 4 4       4       Review of Systems  Constitutional: Negative for fever.  Respiratory: Negative for cough.   Cardiovascular: Positive for chest pain.  Gastrointestinal: Negative for nausea.  All other systems reviewed and are negative.    Allergies  Review of patient's allergies indicates no known allergies.  Home Medications   Current Outpatient Rx  Name Route Sig Dispense Refill  . HYDROXYZINE HCL 50 MG PO TABS Oral Take 1 tablet (50 mg total) by mouth 3 (three) times daily as needed for anxiety. 30 tablet 0    BP 115/81  Pulse 70  Temp 97.6 F (36.4 C) (Oral)  Resp 29  SpO2 100%  LMP 01/02/2012  Physical Exam  Nursing note and vitals reviewed. Constitutional: She is oriented to person, place, and time. She appears well-developed and well-nourished. No distress.  HENT:  Head: Normocephalic and atraumatic.  Neck: Normal range of motion. Neck supple.  Cardiovascular: Normal rate and regular rhythm.  Exam reveals no gallop and no friction rub.   No murmur heard. Pulmonary/Chest: Effort normal and breath sounds normal. No respiratory distress. She has no wheezes.  Abdominal: Soft. Bowel sounds are  normal. She exhibits no distension. There is no tenderness.  Musculoskeletal: Normal range of motion.       There is ttp with palpation of the anterior chest wall.    Neurological: She is alert and oriented to person, place, and time.  Skin: Skin is warm and dry. She is not diaphoretic.    ED Course  Procedures (including critical care time)  Labs Reviewed  BASIC METABOLIC PANEL - Abnormal; Notable for the following:    Potassium 3.4 (*)     Glucose, Bld 110 (*)     All other components within normal limits  CBC  POCT I-STAT TROPONIN I   Dg Chest 2 View  01/25/2012  *RADIOLOGY REPORT*  Clinical Data:  41 year old female left side chest pain times 4 days.  CHEST - 2 VIEW  Comparison: 03/24/2010.  Findings: Stable and normal lung volumes.  Stable and normal mediastinal contours. Visualized tracheal air column is within normal limits.  No pneumothorax, pulmonary edema, pleural effusion or confluent pulmonary opacity. No acute osseous abnormality identified.  IMPRESSION: No acute cardiopulmonary abnormality.   Original Report Authenticated By: Harley Hallmark, M.D.      No diagnosis found.   Date: 01/26/2012  Rate: 70's  Rhythm: normal sinus rhythm  QRS Axis: normal  Intervals: normal  ST/T Wave abnormalities: normal  Conduction Disutrbances:none  Narrative Interpretation:   Old EKG Reviewed: unchanged    MDM  The patient presents with discomfort in the anterior chest wall that is worse with palpation and breathing.  She is not hypoxic and there is no tachycardia.  The ekg is completely normal and the troponin is negative.  This seems very musculoskeletal in nature and I do not believe it requires further workup at this time.  She will be discharged to home with nsaids, pain meds.  To return prn if she worsens.        Geoffery Lyons, MD 01/26/12 [phone]

## 2012-07-22 ENCOUNTER — Ambulatory Visit (INDEPENDENT_AMBULATORY_CARE_PROVIDER_SITE_OTHER): Payer: BC Managed Care – PPO | Admitting: Internal Medicine

## 2012-07-22 VITALS — BP 143/86 | HR 77 | Temp 98.0°F | Resp 17 | Ht 65.5 in | Wt 193.0 lb

## 2012-07-22 DIAGNOSIS — M79609 Pain in unspecified limb: Secondary | ICD-10-CM

## 2012-07-22 DIAGNOSIS — B351 Tinea unguium: Secondary | ICD-10-CM

## 2012-07-22 DIAGNOSIS — L600 Ingrowing nail: Secondary | ICD-10-CM

## 2012-07-22 MED ORDER — HYDROCODONE-ACETAMINOPHEN 5-325 MG PO TABS: 1.0000 | ORAL_TABLET | Freq: Four times a day (QID) | ORAL | Status: DC | PRN

## 2012-07-22 NOTE — Progress Notes (Signed)
Digital block with marcaine and 1% lido 50% for local anesthesia.  Nail lifted and removed.  It was severely curved into the nail bed.  Xeroform gauze placed.  Drsg  Placed and wound care d/w pt.

## 2012-07-22 NOTE — Progress Notes (Signed)
  Subjective:    Patient ID: [facility], female    DOB: 1970/07/01, 42 y.o.   MRN: [id number]  HPI 42 yo female patient comes in today with complaints of her right great toe nail not growing properly. Denies any pain.  Review of Systems     Objective:   Physical Exam        Assessment & Plan:

## 2012-07-22 NOTE — Patient Instructions (Signed)
INGROWN TOENAIL . Keep area clean, dry and bandaged for 24 hours. . After 24 hours, remove outer bandage and leave yellow gauze in place. . Soak toe/foot in warm soapy water for 5-10 minutes, once daily for 5 days. Rebandage toe after each cleaning. . Continue soaks until yellow gauze falls off. . Notify the office if you experience any of the following signs of infection: Swelling, redness, pus drainage, streaking, fever > 101.0 F   

## 2012-07-22 NOTE — Progress Notes (Signed)
  Subjective:    Patient ID: [facility], female    DOB: Dec 06, 1970, 42 y.o.   MRN: [id number]  HPI progressive pain in the toe with a deformed nail over the past 3-4 months/can no longer wear shoes No DM Pos obesity  Review of Systems     Objective:   Physical Exam BP 143/86  Pulse 77  Temp(Src) 98 F (36.7 C) (Oral)  Resp 17  Ht 5' 5.5" (1.664 m)  Wt 193 lb (87.544 kg)  BMI 31.62 kg/m2  SpO2 99%  LMP 07/22/2012 Right great toe with nail deformity . Tender to palpation Nail is thickened with subungual white matter No nailbed cellulitis but edges deeply ingrown  Procedure by Herma Ard [facility]     Assessment & Plan:  Pain toe Onychomycosis Nail deformity  Meds ordered this encounter  Medications  . HYDROcodone-acetaminophen (NORCO/VICODIN) 5-325 MG per tablet    Sig: Take 1 tablet by mouth every 6 (six) hours as needed for pain.    Dispense:  15 tablet    Refill:  0   Wound care Followup for inspection as nail regrows

## 2012-09-23 ENCOUNTER — Ambulatory Visit (INDEPENDENT_AMBULATORY_CARE_PROVIDER_SITE_OTHER): Payer: BC Managed Care – PPO | Admitting: Emergency Medicine

## 2012-09-23 VITALS — BP 104/68 | HR 82 | Temp 97.6°F | Resp 16 | Ht 65.5 in | Wt 198.0 lb

## 2012-09-23 DIAGNOSIS — J029 Acute pharyngitis, unspecified: Secondary | ICD-10-CM

## 2012-09-23 DIAGNOSIS — R300 Dysuria: Secondary | ICD-10-CM

## 2012-09-23 LAB — POCT URINALYSIS DIPSTICK
Bilirubin, UA: NEGATIVE
Glucose, UA: NEGATIVE
Ketones, UA: NEGATIVE
Nitrite, UA: NEGATIVE
Protein, UA: NEGATIVE
Spec Grav, UA: 1.025
Urobilinogen, UA: 0.2
pH, UA: 5.5

## 2012-09-23 LAB — POCT UA - MICROSCOPIC ONLY
Casts, Ur, LPF, POC: NEGATIVE
Crystals, Ur, HPF, POC: NEGATIVE
Mucus, UA: NEGATIVE
RBC, urine, microscopic: NEGATIVE
Yeast, UA: NEGATIVE

## 2012-09-23 LAB — POCT RAPID STREP A (OFFICE): Rapid Strep A Screen: NEGATIVE

## 2012-09-23 NOTE — Progress Notes (Signed)
[facility] [address] [place] [address] [phone]  Date:  09/23/2012   Name:  [facility] Kim [name]   DOB:  March 21, 1971   MRN:  [id number]  PCP:  Pcp Not In System    Chief Complaint: Sore Throat and Dysuria   History of Present Illness:  [facility] Kim [name] is a 42 y.o. very pleasant female patient who presents with the following:  Sore throat over the past week.  No fever or chills. No cough or coryza.  No nausea or vomiting. No stool change.  No wheezing or shortness of breath.  No nasal congestion or drainage.   2 days dysuria and urgency some frequency.  No GYN or GI symptoms   Patient Active Problem List   Diagnosis Date Noted  . Right knee pain 11/11/2011  . Obesity 11/11/2011  . History of abnormal Pap smear 11/11/2011    Past Medical History  Diagnosis Date  . 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 Asthma     when pregnant    No past surgical history on file.  History  Substance Use Topics  . Smoking status: Never Smoker   . Smokeless tobacco: Never Used  . Alcohol Use: Yes     Comment: 1-2/month    Family History  Problem Relation Age of Onset  . Diabetes Mother   . Hypertension Mother   . Hyperlipidemia Father   . Diabetes Father   . Hypertension Father   . Heart disease Sister   . Diabetes Brother   . Hyperlipidemia Brother   . Drug abuse Paternal Grandmother     No Known Allergies  Medication list has been reviewed and updated.  No current outpatient prescriptions on file prior to visit.   No current facility-administered medications on file prior to visit.    Review of Systems:  As per HPI, otherwise negative.    Physical Examination: Filed Vitals:   09/23/12 1510  BP: 104/68  Pulse: 82  Temp: 97.6 F (36.4 C)  Resp: 16   Filed Vitals:   09/23/12 1510  Height: 5' 5.5" (1.664 m)  Weight: 198 lb (89.812 kg)   Body mass index is 32.44  kg/(m^2). Ideal Body Weight: Weight in (lb) to have BMI = 25: 152.2  GEN: WDWN, NAD, Non-toxic, A & O x 3 HEENT: Atraumatic, Normocephalic. Neck supple. No masses, No LAD. Ears and Nose: No external deformity. CV: RRR, No M/G/R. No JVD. No thrill. No extra heart sounds. PULM: CTA B, no wheezes, crackles, rhonchi. No retractions. No resp. distress. No accessory muscle use. ABD: S, NT, ND, +BS. No rebound. No HSM. EXTR: No c/c/e NEURO Normal gait.  PSYCH: Normally interactive. Conversant. Not depressed or anxious appearing.  Calm demeanor.    Assessment and Plan: Pharyngitis Dysuria   Signed,  Phillips Odor, MD   Results for orders placed in visit on 09/23/12  POCT UA - MICROSCOPIC ONLY      Result Value Range   WBC, Ur, HPF, POC 0-1     RBC, urine, microscopic neg     Bacteria, U Microscopic trace     Mucus, UA neg     Epithelial cells, urine per micros 5-10     Crystals, Ur, HPF, POC neg     Casts, Ur, LPF, POC neg     Yeast, UA neg    POCT URINALYSIS DIPSTICK  Result Value Range   Color, UA yellow     Clarity, UA cloudy     Glucose, UA neg     Bilirubin, UA neg     Ketones, UA neg     Spec Grav, UA 1.025     Blood, UA trace-lysed     pH, UA 5.5     Protein, UA neg     Urobilinogen, UA 0.2     Nitrite, UA neg     Leukocytes, UA Trace    POCT RAPID STREP A (OFFICE)      Result Value Range   Rapid Strep A Screen Negative  Negative

## 2012-10-05 ENCOUNTER — Ambulatory Visit (INDEPENDENT_AMBULATORY_CARE_PROVIDER_SITE_OTHER): Payer: BC Managed Care – PPO | Admitting: Internal Medicine

## 2012-10-05 ENCOUNTER — Ambulatory Visit: Payer: BC Managed Care – PPO

## 2012-10-05 VITALS — BP 102/70 | HR 63 | Temp 98.0°F | Resp 16 | Ht 65.0 in | Wt 196.0 lb

## 2012-10-05 DIAGNOSIS — M25561 Pain in right knee: Secondary | ICD-10-CM

## 2012-10-05 DIAGNOSIS — M25569 Pain in unspecified knee: Secondary | ICD-10-CM

## 2012-10-05 DIAGNOSIS — D235 Other benign neoplasm of skin of trunk: Secondary | ICD-10-CM

## 2012-10-05 DIAGNOSIS — D225 Melanocytic nevi of trunk: Secondary | ICD-10-CM

## 2012-10-05 DIAGNOSIS — J029 Acute pharyngitis, unspecified: Secondary | ICD-10-CM

## 2012-10-05 LAB — POCT CBC
Granulocyte percent: 61.9 %G (ref 37–80)
HCT, POC: 43 % (ref 37.7–47.9)
Hemoglobin: 13.5 g/dL (ref 12.2–16.2)
Lymph, poc: 3.4 (ref 0.6–3.4)
MCH, POC: 29.5 pg (ref 27–31.2)
MCHC: 31.4 g/dL — AB (ref 31.8–35.4)
MCV: 94.1 fL (ref 80–97)
MID (cbc): 0.6 (ref 0–0.9)
MPV: 8.3 fL (ref 0–99.8)
POC Granulocyte: 6.5 (ref 2–6.9)
POC LYMPH PERCENT: 32.2 %L (ref 10–50)
POC MID %: 5.9 %M (ref 0–12)
Platelet Count, POC: 288 10*3/uL (ref 142–424)
RBC: 4.57 M/uL (ref 4.04–5.48)
RDW, POC: 14 %
WBC: 10.5 10*3/uL — AB (ref 4.6–10.2)

## 2012-10-05 MED ORDER — AMOXICILLIN 875 MG PO TABS: 875.0000 mg | ORAL_TABLET | Freq: Two times a day (BID) | ORAL | Status: DC

## 2012-10-05 MED ORDER — MELOXICAM 15 MG PO TABS: 15.0000 mg | ORAL_TABLET | Freq: Every day | ORAL | Status: DC

## 2012-10-05 NOTE — Progress Notes (Signed)
  Subjective:    Patient ID: [facility], female    DOB: Nov 16, 1970, 42 y.o.   MRN: [id number]  HPIgrowth hurts R breast R knee pain one year-C. evaluation at family practice/no treatment since/Pain and swelling off and on worse with weightbearing  Continued R wrist pain-referred to Dr. Janee Morn under her compensation but they apparently have not been covering her claims / she has had a recent injection and MRI both of which were not covered for some reason, and she is now better  Continuing throat pain since last visit/hurts to eat/no fever/?lesions tongue and thr No cough   Review of Systems No fever chills or night sweats    no shortness of breath or cough  No fatigue or change in activity  Objective:   Physical Exam TMs clear,  conjunctiva clear// Nares clear   Throat with enlarged tonsils that are slightly irritated without exudate/no obvious ulcers-vesicles 1+ a.c. nodes slightly tender   chest clear Right outer breast with pedunculated irritated nevus that is tender to touch Right knee with pain on full extension, pain with palpation on medial joint line, positive McMurray's on extension, stable to stress ors, full patellar ballottement, no effusion   Results for orders placed in visit on 10/05/12  POCT CBC      Result Value Range   WBC 10.5 (*) 4.6 - 10.2 K/uL   Lymph, poc 3.4  0.6 - 3.4   POC LYMPH PERCENT 32.2  10 - 50 %L   MID (cbc) 0.6  0 - 0.9   POC MID % 5.9  0 - 12 %M   POC Granulocyte 6.5  2 - 6.9   Granulocyte percent 61.9  37 - 80 %G   RBC 4.57  4.04 - 5.48 M/uL   Hemoglobin 13.5  12.2 - 16.2 g/dL   HCT, POC 40.9  81.1 - 47.9 %   MCV 94.1  80 - 97 fL   MCH, POC 29.5  27 - 31.2 pg   MCHC 31.4 (*) 31.8 - 35.4 g/dL   RDW, POC 91.4     Platelet Count, POC 288  142 - 424 K/uL   MPV 8.3  0 - 99.8 fL      [facility] reading (PRIMARY) by  Dr.Arsema Tusing=NAD   Assessment & Plan:  1)Pain, knee, right -the chronicity and the history of intermittent effusion  suggests an underlying meniscus problem as does the knee exam/there is no response to meloxicam and exercises will establish orthopedic referral 2)Acute pharyngitis -unclear etiology/persistent tonsillitis  Trial of amoxicillin 3) right hand and wrist pain from a workers compensation injury /referred back to workers comp because it sounds as if accompanied is not proceeding correctly with  approval of various things done by consulting orthopedist Dr.  Janee Morn 4) irritated nevus-removed PAC Leotis Shames

## 2012-10-05 NOTE — Progress Notes (Signed)
I directly supervised and participated in the procedure with the student and agree.

## 2012-10-05 NOTE — Progress Notes (Signed)
VCO. Nevus on RIGHT chest was cleansed with alcohol and anesthestized with 1cc of 2% lidocaine with epi. Nevus was cut across the base and removed. Dressed with antibiotic ointment and bandage. Pt tolerated well.

## 2012-11-29 ENCOUNTER — Telehealth: Payer: Self-pay

## 2012-11-29 NOTE — Telephone Encounter (Signed)
Request given to xray 

## 2012-11-29 NOTE — Telephone Encounter (Signed)
PATIENT IS REQUESTING HER X-RAY OF HER KNEE. SHE HAS TO GO TO A SPECIALIST. PLEASE CALL HER WHEN IT IS READY TO BE PICKED UP.  BEST PHONE [phone] (CELL)  MBC

## 2012-12-08 ENCOUNTER — Ambulatory Visit (INDEPENDENT_AMBULATORY_CARE_PROVIDER_SITE_OTHER): Payer: BC Managed Care – PPO | Admitting: Internal Medicine

## 2012-12-08 VITALS — BP 112/70 | HR 61 | Temp 97.5°F | Resp 16 | Ht 65.5 in | Wt 200.0 lb

## 2012-12-08 DIAGNOSIS — K219 Gastro-esophageal reflux disease without esophagitis: Secondary | ICD-10-CM

## 2012-12-08 DIAGNOSIS — Z0000 Encounter for general adult medical examination without abnormal findings: Secondary | ICD-10-CM

## 2012-12-08 DIAGNOSIS — E663 Overweight: Secondary | ICD-10-CM

## 2012-12-08 DIAGNOSIS — R0789 Other chest pain: Secondary | ICD-10-CM

## 2012-12-08 DIAGNOSIS — G4700 Insomnia, unspecified: Secondary | ICD-10-CM

## 2012-12-08 LAB — COMPREHENSIVE METABOLIC PANEL
ALT: 16 U/L (ref 0–35)
AST: 14 U/L (ref 0–37)
Albumin: 3.9 g/dL (ref 3.5–5.2)
Alkaline Phosphatase: 75 U/L (ref 39–117)
BUN: 13 mg/dL (ref 6–23)
CO2: 24 mEq/L (ref 19–32)
Calcium: 8.8 mg/dL (ref 8.4–10.5)
Chloride: 107 mEq/L (ref 96–112)
Creat: 0.52 mg/dL (ref 0.50–1.10)
Glucose, Bld: 85 mg/dL (ref 70–99)
Potassium: 4.2 mEq/L (ref 3.5–5.3)
Sodium: 138 mEq/L (ref 135–145)
Total Bilirubin: 0.3 mg/dL (ref 0.3–1.2)
Total Protein: 6.8 g/dL (ref 6.0–8.3)

## 2012-12-08 LAB — POCT CBC
Granulocyte percent: 49.9 %G (ref 37–80)
HCT, POC: 40.1 % (ref 37.7–47.9)
Hemoglobin: 12.6 g/dL (ref 12.2–16.2)
Lymph, poc: 3.1 (ref 0.6–3.4)
MCH, POC: 29.4 pg (ref 27–31.2)
MCHC: 31.4 g/dL — AB (ref 31.8–35.4)
MCV: 93.4 fL (ref 80–97)
MID (cbc): 0.5 (ref 0–0.9)
MPV: 9 fL (ref 0–99.8)
POC Granulocyte: 3.6 (ref 2–6.9)
POC LYMPH PERCENT: 42.7 %L (ref 10–50)
POC MID %: 7.4 %M (ref 0–12)
Platelet Count, POC: 276 10*3/uL (ref 142–424)
RBC: 4.29 M/uL (ref 4.04–5.48)
RDW, POC: 13.8 %
WBC: 7.2 10*3/uL (ref 4.6–10.2)

## 2012-12-08 LAB — LIPID PANEL
Cholesterol: 145 mg/dL (ref 0–200)
HDL: 37 mg/dL — ABNORMAL LOW (ref >39)
LDL Cholesterol: 77 mg/dL (ref 0–99)
Total CHOL/HDL Ratio: 3.9 Ratio
Triglycerides: 157 mg/dL — ABNORMAL HIGH (ref <150)
VLDL: 31 mg/dL (ref 0–40)

## 2012-12-08 LAB — TSH: TSH: 1.723 u[IU]/mL (ref 0.350–4.500)

## 2012-12-08 MED ORDER — OMEPRAZOLE 40 MG PO CPDR: 40.0000 mg | DELAYED_RELEASE_CAPSULE | Freq: Every day | ORAL | Status: DC

## 2012-12-08 NOTE — Patient Instructions (Signed)
Apnea del sueo  (Sleep Apnea)  La apnea del sueo es un trastorno caracterizado por pausas anormales en la respiracin durante el sueo. Cuando la respiracin se detiene, Air cabin crew de oxgeno en la sangre disminuye. Esto hace que salga del sueo profundo y [place] en el sueo ligero. [employer], la calidad del sueo es pobre y el sistema que transporta la sangre a travs del cuerpo (sistema cardiovascular) experimenta estrs. Si la apnea del sueo no se trata, Building services engineer los siguientes problemas:   Presin arterial elevada (hipertensin).  Enfermedad coronaria.  Incapacidad para alcanzar o Designer, fashion/clothing (impotencia).  Deterioro de los procesos de pensamiento (disfuncin cognitiva). Existen tres tipos de apnea del sueo.  1. Apnea del sueo obstructiva - Pausas en la respiracin durante el sueo debido a una obstruccin de las vas areas. 2. Apnea del sueo central -Pausas en la respiracin durante el sueo debido a que la zona del cerebro que controla la respiracin no enva las seales correctas a los msculos que la controlan. 3. Apnea del sueo mixta - [place] combinacin de [place], tanto obstructiva como central. FACTORES DE RIESGO  Sin embargo, los siguientes factores pueden aumentar el riesgo de desarrollar apnea del sueo.   Sobrepeso.  El hbito de fumar.  Pasajes estrechos en la nariz y la garganta.  Edad avanzada.  Sexo masculino.  El consumo de alcohol.  Uso de sedantes y tranquilizantes.  La etnia. Entre los individuos menores de 35 aos, los afroamericanos tienen ms riesgo. SNTOMAS   Dificultad para permanecer dormido.  Somnolencia y Doctor, hospital.  Prdida de energa.  Irritabilidad.  Fuertes ronquidos.  Dolores de cabeza matutinos.  Dificultad para concentrarse.  Olvidos.  Disminucin del inters por el sexo. DIAGNSTICO  Para diagnosticar la apnea del sueo, Public librarian un examen fsico. Su mdico podr indicarle un  estudio del sueo para Transport planner. Tambin podr indicarle que pase la noche en un laboratorio del sueo. En el laboratorio del sueo, varios monitores registran informacin sobre el corazn, los pulmones y el cerebro durante el sueo. Tambin se registran los movimientos de brazos y [place], y Air cabin crew de oxgeno en la sangre.  TRATAMIENTO  Las acciones siguientes pueden ayudar a Oncologist la apnea del sueo leve:   Duerma de lado.   Use un descongestivo, tiene congestin nasal.   Evite el uso de depresores, incluyendo el alcohol, sedantes y narcticos.   Baje de peso y modifique su dieta si tiene sobrepeso. Existen dispositivos y tratamientos para ayudar a abrir las vas respiratorias:   Dispositivos bucales. Son boquillas a medida que modifican ligeramente la posicin de la mandbula inferior [place] adelante y abren la mordida. Esto permite la apertura de las vas respiratorias.  Dispositivos que generan presin positiva en las vas areas. Esta presin positiva [employer] vas respiratorias para ayudar a Therapist, nutritional sueo. Los siguientes dispositivos crean una presin area positiva:  Dispositivo de presin positiva continua de las vas respiratorias (CPAP). Este dispositivo CPAP crea un nivel continuo de presin de aire con [place] bomba de aire. El aire se enva a las vas respiratorias a travs de una mscara durante el sueo. Esta presin continua mantiene las vas areas [place].  Dispositivo de presin espiratoria positiva en las vas areas (EPAP). El dispositivo EPAP crea una presin positiva de aire a medida que se exhala. Consta de vlvulas de un solo uso que se insertan en cada fosa nasal y las mantienen en su  sitio mediante un [employer]. Las vlvulas crean muy poca resistencia al inhalar pero crean resistencia al exhalar. Ese aumento de la resistencia crea una presin de [place]. Esta presin positiva al exhalar mantiene abiertas las vas  respiratorias, lo que facilita la respiracin al inhalar nuevamente.  Dispositivo de presin positiva de [place] vas areas (BPAP). El dispositivo BPAP se utiliza principalmente en pacientes con apnea del sueo central. Este dispositivo es similar al dispositivo de CPAP ya que utiliza una bomba de aire para suministrar presin de aire continua a travs de Tourist information centre manager. Sin embargo, con el dispositivo BPAP la presin se Naval architect. Al exhalar, la presin es inferior a la presin que se establece al [employer].  Ciruga. Por lo general, la ciruga slo se realiza si no puede cumplir con los tratamientos menos invasivos o si estos no mejoran su requisito. La ciruga consiste en extraer el exceso de tejidos en las vas areas para crear un pasaje ms amplio. Document Released: 01/12/2005 Document Revised: 10/04/2011 [facility] Patient Information 2014 [place], [place]. Reflujo gastroesofgico - Adultos  (Gastroesophageal Reflux Disease, Adult)  El reflujo gastroesofgico ocurre cuando el cido del estmago pasa al esfago. Cuando el cido entra en contacto con el esfago, el cido provoca dolor (inflamacin) en el esfago. Con el tiempo, pueden formarse pequeos agujeros (lceras) en el revestimiento del esfago. CAUSAS   Exceso de Runner, broadcasting/film/video. Esto aplica presin [employer], lo que hace que el cido del estmago suba hacia el esfago.  El hbito de fumar Aumenta la produccin de cido en el [place].  El consumo de alcohol. Provoca disminucin de la presin en el esfnter esofgico inferior (vlvula o anillo de msculo entre el esfago y Investment banker, corporate), permitiendo que el cido del estmago suba hacia el esfago.  Cenas a ltima hora del da y estmago lleno. Aumenta la presin y la produccin de cido en el estmago.  Malformacin en el esfnter esofgico inferior. A menudo no se halla causa.  SNTOMAS   Ardor y Radiographer, therapeutic parte inferior del pecho detrs  del esternn y en la zona media del [place]. Puede ocurrir [employer] por semana o ms a menudo.  Dificultad para tragar.  Dolor de Advertising copywriter.  Tos seca.  Sntomas similares al asma que incluyen sensacin de opresin en el pecho, falta de aire y sibilancias. DIAGNSTICO  El mdico diagnosticar el problema basndose en los sntomas. En algunos casos, se indican radiografas y otras pruebas para verificar si hay complicaciones o para comprobar el estado del [address] y Training and development officer.  TRATAMIENTO  El mdico le indicar medicamentos de venta libre o recetados para ayudar a disminuir la produccin de cido. Consulte con su mdico antes de Corporate investment banker o agregar cualquier medicamento nuevo.  INSTRUCCIONES PARA EL CUIDADO EN EL HOGAR   Modifique los factores que pueda cambiar. Consulte con su mdico para solicitar orientacin relacionada con la prdida de peso, dejar de fumar y el consumo de alcohol.  Evite las comidas y bebidas que [address] [place], [place]:  [place] con cafena o alcohlicas.  Chocolate.  Sabores a Advertising account planner.  Ajo y cebolla.  Comidas muy condimentadas.  Ctricos como naranjas, limones o limas.  Alimentos que contengan tomate, como salsas, [place] y pizza.  Alimentos fritos y Lexicographer.  Evite acostarse durante 3 horas antes de irse a dormir o antes de tomar una siesta.  Haga comidas pequeas durante Glass blower/designer de 3 comidas abundantes.  Use ropas sueltas. No  use nada apretado alrededor de la cintura que cause presin en el estmago.  Levante (eleve) la cabecera de la cama 6 a 8 pulgadas (15 a 20 cm) con bloques de madera. Usar almohadas extra no ayuda.  Solo tome medicamentos que se pueden comprar sin receta o recetados para el dolor, Dentist o fiebre, como le indica el mdico.  No tome aspirina, ibuprofeno ni antiinflamatorios no esteroides. SOLICITE ATENCIN MDICA DE [employer] SI:   [employer], el cuello, la Enigma, los dientes o la  espalda.  El dolor aumenta o cambia la intensidad o la durancin.  Tiene nuseas, vmitos o sudoracin(diaforesis).  Siente falta de aire o dolor en el pecho, o se desmaya.  Vomita y el vmito tiene [place], es de color [place], [place], negro o es similar a la borra del caf o tiene [place].  Las heces son rojas, sanguinolentas o negras. Estos sntomas pueden ser signos de [address], como enfermedades cardacas, hemorragias gstrias o sangrado esofgico.  ASEGRESE DE QUE:   Comprende estas instrucciones.  Controlar su enfermedad.  Solicitar ayuda de inmediato si no mejora o si empeora. Document Released: 01/12/2005 Document Revised: 06/27/2011 [facility] Patient Information 2014 [place], [place].

## 2012-12-08 NOTE — Progress Notes (Signed)
  Subjective:    Patient ID: [facility], female    DOB: March 09, 1971, 42 y.o.   MRN: [id number]  HPI Patient reports today for a complete physical exam. She is accompanied by her 82 year old daughter who translates for her. Patient has a long history of intermittent chest pressure that is sometimes accompanied by eyes feeling "weird," and pain into her back and left arm numbness. This has been worked up on several occasions at the ER. Records show diagnosis of anxiety.  She has difficulty staying asleep, family members report that she snores. She does not awaken feeling refreshed.  Right eye dry, sometimes painful.   Complains of acid reflux for along time, worse with spicy food, OJ, lying down.  Has history of abnormal pap smears. Had last pap smear 3 months ago. Is interested in seeing a different gyn.  Review of Systems  Eyes: Positive for pain and itching.  Cardiovascular: Positive for chest pain.  Gastrointestinal: Positive for abdominal pain.  Musculoskeletal: Positive for back pain.       Objective:   Physical Exam  Constitutional: She is oriented to person, place, and time. She appears well-nourished. No distress.  Overweight  HENT:  Right Ear: Hearing, tympanic membrane, external ear and ear canal normal.  Left Ear: Hearing, tympanic membrane, external ear and ear canal normal.  Shallow hypopharynx with Tonsils 3+.  Cardiovascular: Normal rate, regular rhythm, normal heart sounds and intact distal pulses.   Pulmonary/Chest: Effort normal and breath sounds normal.  Abdominal: Soft. Bowel sounds are normal. She exhibits no distension. There is no tenderness.  Musculoskeletal: Normal range of motion. She exhibits no edema and no tenderness.  Neurological: She is alert and oriented to person, place, and time. She has normal reflexes.  Skin: Skin is warm and dry.  Psychiatric: She has a normal mood and affect. Her behavior is normal.          Assessment &  Plan:  Routine general medical examination at a health care facility - Plan: POCT CBC, Lipid panel, TSH  Insomnia - Plan: Comprehensive metabolic panel, Ambulatory referral to Neurology to consider sleep apnea. Provided patient with information regarding obstructive sleep apnea and discussed referral to Dr. Vickey Huger for evaluation.  Overweight - diet and exercise discussed  Chest pressure - reassured. See numerous evaluations in the emergency room  GERD (gastroesophageal reflux disease) - Plan: omeprazole (PRILOSEC) 40 MG capsule  Discussed GERD and that it may be contributing to chest pressure. Gave information about GERD. And instructions regarding Omeprezole.  Fully participated in this examination and agree with the documentation. Robert P. Sandria Bales.D.

## 2012-12-11 ENCOUNTER — Encounter: Payer: Self-pay | Admitting: Internal Medicine

## 2012-12-31 ENCOUNTER — Institutional Professional Consult (permissible substitution): Payer: Self-pay | Admitting: Neurology

## 2013-03-24 ENCOUNTER — Emergency Department (HOSPITAL_COMMUNITY)
Admission: EM | Admit: 2013-03-24 | Discharge: 2013-03-24 | Disposition: A | Discharge status: Home / Self Care | Payer: BC Managed Care – PPO | Attending: Emergency Medicine | Admitting: Emergency Medicine

## 2013-03-24 ENCOUNTER — Encounter (HOSPITAL_COMMUNITY): Payer: Self-pay | Admitting: Emergency Medicine

## 2013-03-24 DIAGNOSIS — Z206 Contact with and (suspected) exposure to human immunodeficiency virus [HIV]: Secondary | ICD-10-CM

## 2013-03-24 DIAGNOSIS — Z20828 Contact with and (suspected) exposure to other viral communicable diseases: Secondary | ICD-10-CM | POA: Insufficient documentation

## 2013-03-24 DIAGNOSIS — Z79899 Other long term (current) drug therapy: Secondary | ICD-10-CM | POA: Insufficient documentation

## 2013-03-24 DIAGNOSIS — Z791 Long term (current) use of non-steroidal anti-inflammatories (NSAID): Secondary | ICD-10-CM | POA: Insufficient documentation

## 2013-03-24 DIAGNOSIS — J45909 Unspecified asthma, uncomplicated: Secondary | ICD-10-CM | POA: Insufficient documentation

## 2013-03-24 MED ORDER — EMTRICITABINE-TENOFOVIR DF 200-300 MG PO TABS: 1.0000 | ORAL_TABLET | Freq: Once | ORAL | Status: DC

## 2013-03-24 MED ORDER — EMTRICITABINE-TENOFOVIR DF 200-300 MG PO TABS
1.0000 | ORAL_TABLET | Freq: Once | ORAL | Status: AC
  Administered 2013-03-24: 1 via ORAL
  Filled 2013-03-24: qty 1

## 2013-03-24 MED ORDER — RALTEGRAVIR POTASSIUM 400 MG PO TABS
400.0000 mg | ORAL_TABLET | Freq: Once | ORAL | Status: AC
  Administered 2013-03-24: 400 mg via ORAL
  Filled 2013-03-24: qty 1

## 2013-03-24 MED ORDER — RALTEGRAVIR POTASSIUM 400 MG PO TABS: 400.0000 mg | ORAL_TABLET | Freq: Two times a day (BID) | ORAL | Status: DC

## 2013-03-24 NOTE — ED Provider Notes (Signed)
CSN: [id number]     Arrival date & time 03/24/13  0019 History   First MD Initiated Contact with Patient 03/24/13 0141     Chief Complaint  Patient presents with  . wants a hiv test    (Consider location/radiation/quality/duration/timing/severity/associated sxs/prior Treatment) HPI Comments: And states, that while helping her.  Uncles/cousin, who came to the door with a laceration.  She accidentally got blood in her mouth.  This on-call/cousin is known to be HIV for the past 10 years.  He is currently on medication, but they are unsure of what it is or what his viral load is.  The history is provided by the patient.    Past Medical History  Diagnosis Date  . 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 Asthma     when pregnant   History reviewed. No pertinent past surgical history. Family History  Problem Relation Age of Onset  . Diabetes Mother   . Hypertension Mother   . Hyperlipidemia Father   . Diabetes Father   . Hypertension Father   . Heart disease Sister   . Diabetes Brother   . Hyperlipidemia Brother   . Drug abuse Paternal Grandmother    History  Substance Use Topics  . Smoking status: Never Smoker   . Smokeless tobacco: Never Used  . Alcohol Use: No   OB History   Grav Para Term Preterm Abortions TAB SAB Ect Mult Living   4 4 4       4      Review of Systems  Constitutional: Negative for fever and chills.  Respiratory: Negative for cough.   Skin: Negative for color change.  Neurological: Negative for dizziness and headaches.  All other systems reviewed and are negative.    Allergies  Review of patient's allergies indicates no known allergies.  Home Medications   Current Outpatient Rx  Name  Route  Sig  Dispense  Refill  . diclofenac (VOLTAREN) 50 MG EC tablet   Oral   Take 50 mg by mouth daily.         Marland Kitchen emtricitabine-tenofovir (TRUVADA) 200-300 MG per tablet   Oral   Take 1 tablet by mouth  once.   27 tablet   0   . raltegravir (ISENTRESS) 400 MG tablet   Oral   Take 1 tablet (400 mg total) by mouth 2 (two) times daily.   56 tablet   0    BP 114/81  Pulse 70  Temp(Src) 98.2 F (36.8 C) (Oral)  Resp 18  SpO2 99% Physical Exam  Nursing note and vitals reviewed. Constitutional: She is oriented to person, place, and time. She appears well-developed and well-nourished.  HENT:  Head: Normocephalic.  Eyes: Pupils are equal, round, and reactive to light.  Neck: Normal range of motion.  Cardiovascular: Normal rate.   Pulmonary/Chest: Effort normal.  Neurological: She is alert and oriented to person, place, and time.  Skin: Skin is warm and dry.    ED Course  Procedures (including critical care time) Labs Review Labs Reviewed  HIV ANTIBODY (ROUTINE TESTING)   Imaging Review No results found.  EKG Interpretation   None       MDM   1. HIV exposure from tainted blood     I spoke with Dr. Seth Bake. MDM from infectious disease, who is recommending due to the exposure of blood in her mouth, that she be started on Isentress 400 mg  twice a day and Truvada one tablet daily for the next 28 days, as well as a baseline HIV exposure.  Panel    Arman Filter, NP 03/24/13 [phone]

## 2013-03-24 NOTE — ED Notes (Signed)
The pt is here to get an hiv  They have an uncle that has aids and was around them one hour ago and was all bloody.  English is broken

## 2013-03-24 NOTE — ED Notes (Addendum)
Pt alert, NAD, calm, interactive, resps e/u, speaking in clear complete sentences, skin W&D.  Pt seen by EDP prior to RN assessment, see NP notes, orders received & initiated, orders to medicate and d/c prior to RN introduction. Here for assessment and treatment for exposure to family member's blood who has HIV. Exposed today, no s/sx. Blood exposure to mouth.  

## 2013-03-25 ENCOUNTER — Ambulatory Visit (INDEPENDENT_AMBULATORY_CARE_PROVIDER_SITE_OTHER): Payer: BC Managed Care – PPO | Admitting: Internal Medicine

## 2013-03-25 ENCOUNTER — Encounter: Payer: Self-pay | Admitting: Physician Assistant

## 2013-03-25 VITALS — BP 112/68 | HR 83 | Temp 97.9°F | Resp 16 | Ht 65.5 in | Wt 206.0 lb

## 2013-03-25 DIAGNOSIS — Z206 Contact with and (suspected) exposure to human immunodeficiency virus [HIV]: Secondary | ICD-10-CM

## 2013-03-25 DIAGNOSIS — M79609 Pain in unspecified limb: Secondary | ICD-10-CM

## 2013-03-25 DIAGNOSIS — Z20828 Contact with and (suspected) exposure to other viral communicable diseases: Secondary | ICD-10-CM

## 2013-03-25 DIAGNOSIS — M79644 Pain in right finger(s): Secondary | ICD-10-CM

## 2013-03-25 MED ORDER — MELOXICAM 15 MG PO TABS: 15.0000 mg | ORAL_TABLET | Freq: Every day | ORAL | Status: DC

## 2013-03-25 MED ORDER — EMTRICITABINE-TENOFOVIR DF 200-300 MG PO TABS: 1.0000 | ORAL_TABLET | Freq: Once | ORAL | Status: DC

## 2013-03-25 MED ORDER — RALTEGRAVIR POTASSIUM 400 MG PO TABS: 400.0000 mg | ORAL_TABLET | Freq: Two times a day (BID) | ORAL | Status: DC

## 2013-03-25 NOTE — ED Provider Notes (Signed)
Medical screening examination/treatment/procedure(s) were performed by non-physician practitioner and as supervising physician I was immediately available for consultation/collaboration.    Sunnie Nielsen, MD 03/25/13 [phone]

## 2013-03-25 NOTE — Progress Notes (Signed)
   CARE MANAGEMENT ED NOTE 03/25/2013  Patient:  Kim [name], Kim [name]   Account Number:  [id number]  Date Initiated:  03/25/2013  Documentation initiated by:  Radford Pax  Subjective/Objective Assessment:   Patient presented to the Ed on 12/07 at [facility] cone post possible HIV exposure.     Subjective/Objective Assessment Detail:   Patient reportedly got blood in her mouth from a laceration from a family member.     Action/Plan:   Action/Plan Detail:   Anticipated DC Date:       Status Recommendation to Physician:   Result of Recommendation:    Other ED Services  Consult Working Plan    DC Planning Services  Other  Medication Assistance    Choice offered to / List presented to:            Status of service:  Completed, signed off  ED Comments:   ED Comments Detail:  [facility] received phone call from flow manager reporting this patient has questions about the medication Truvada.  EDCM spoke to patient's son Reuel Boom as patient does not speak [place].  Patient's son confirms patient has [employer].  As per patient's son, "The Truvada comapny is requesting a letter stating how long she will need to be on the medication  It is a very expensive medication."  "Can you provide this letter?"  EDCM explained to patient that we would not be able to help with the cost of the medication due to patient having health insurance. Patient's son reports the Truvada company would only help with one medication but not the others.  EDCM provided patient's son with website needymeds.org.  Patient's son reports he is familiar with this website.  EDCM explained to patient's son that patient's pcp could assist them in obtaining the letter they need for Truvada.  Patient's son asked, "What do you suggest I do first?"  EDCM instructed patient's son to call the patient's pcp to obtain the letter neccessary to provide to Truvada company.  EDCM offered support to patient's son.  No further CM needs at this  time.

## 2013-03-25 NOTE — Progress Notes (Signed)
Subjective:    Patient ID: Kim [name], female    DOB: [birth year], 42 y.o.   MRN: [id number] This chart was scribed for Kim Sia, MD by Clydene Laming, ED Scribe. This patient was seen in room 12 and the patient's care was started at 7:24 PM. Hand Pain    HPI Comments: [facility] [name] is a 42 y.o. female who presents to the [facility] complaining of blood exposure to her HIV pos son. SEE ER NOTE. Given meds she can't afford for post exp prophyx--$2000. Has found online appl for meds. Doesn't understand the need to be on w/in 72h. 1st 24 given by ER. Asympto. Here w/ son.   Pt also is having right hand pain for the last several months. Work injury. Pt went to 2 diff hand specialists and they did not find anything. Failed to inprove w/ brace and PT.  Pt was given a brace to wear during the day and another at night. She states it hurts a lot when she moves her hand and thus can not sleep. Pt was prescribed two medications. She has also received three injections in the hand. Pt will report to Dr. Amanda Pea for an additional opinion unless we have better ideas tonight.   Patient Active Problem List   Diagnosis Date Noted  . Right knee pain 11/11/2011  . Obesity 11/11/2011  . History of abnormal Pap smear 11/11/2011   Past Medical History  Diagnosis Date  . 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 Asthma     when pregnant   No past surgical history on file. No Known Allergies Prior to Admission medications   Medication Sig Start Date End Date Taking? Authorizing Provider  diclofenac (VOLTAREN) 50 MG EC tablet Take 50 mg by mouth daily.   Yes Historical Provider, MD  omeprazole (PRILOSEC) 40 MG capsule Take 40 mg by mouth daily.   Yes Historical Provider, MD  emtricitabine-tenofovir (TRUVADA) 200-300 MG per tablet Take 1 tablet by mouth once. 03/24/13   Arman Filter, NP  meloxicam (MOBIC) 15 MG  tablet Take 1 tablet (15 mg total) by mouth daily. 03/25/13   Tonye Pearson, MD  raltegravir (ISENTRESS) 400 MG tablet Take 1 tablet (400 mg total) by mouth 2 (two) times daily. 03/24/13   Arman Filter, NP   History   Social History  . Marital Status: Married    Spouse Name: [name]    Number of Children: [name]  . Years of Education: [name]   Occupational History  . Not on file.   Social History Main Topics  . Smoking status: Never Smoker   . Smokeless tobacco: Never Used  . Alcohol Use: No  . Drug Use: No  . Sexual Activity: Yes   Other Topics Concern  . Not on file   Social History Narrative   Married with four children. Came to [place] from [place] in 2001. Unemployed currently. Spanish speaking.       Review of Systems  Constitutional: Negative for fever and chills.  Musculoskeletal: Negative for joint swelling. Myalgias: right hand.  Skin: Negative for rash.       Objective:   Physical Exam  Constitutional: She is oriented to person, place, and time. She appears well-developed and well-nourished. No distress.  Anxious. Son translating.  Eyes: EOM are normal. Pupils are equal, round, and reactive to light.  Cardiovascular: Normal  rate.   Pulmonary/Chest: Effort normal.  Musculoskeletal: She exhibits no edema.  Tender over the mcc  at the base of the R  thumb Pain with palm. flexion and palpation MCC No swelling No ecchymosis Wrist rom normal  finkelst equivocal  Lymphadenopathy:    She has no cervical adenopathy.  Neurological: She is alert and oriented to person, place, and time.  Psychiatric: She has a normal mood and affect. Her behavior is normal.   Filed Vitals:   03/25/13 1901  BP: 112/68  Pulse: 83  Temp: 97.9 F (36.6 C)  TempSrc: Oral  Resp: 16  Height: 5' 5.5" (1.664 m)  Weight: 206 lb (93.441 kg)  SpO2: 100%         Assessment & Plan:  I have completed the patient encounter in its entirety as documented by the scribe, with editing  by me where necessary. Taimur Fier P. Merla Riches, M.D. Thumb pain, right  Nothing to offer other than adv to wear stiff splint all the time, take meloxicam, and f/u w/ Dr Amanda Pea  HIV exposure  For PEP she was given 7d supplies of both meds w/ ref 1 and the necessary documentation to apply for online supplies to complete 28 d therapy.

## 2013-04-29 ENCOUNTER — Ambulatory Visit (INDEPENDENT_AMBULATORY_CARE_PROVIDER_SITE_OTHER): Payer: BC Managed Care – PPO | Admitting: Family Medicine

## 2013-04-29 VITALS — BP 120/74 | HR 71 | Temp 98.7°F | Resp 18 | Ht 66.0 in | Wt 202.0 lb

## 2013-04-29 DIAGNOSIS — F411 Generalized anxiety disorder: Secondary | ICD-10-CM

## 2013-04-29 DIAGNOSIS — Z131 Encounter for screening for diabetes mellitus: Secondary | ICD-10-CM

## 2013-04-29 LAB — GLUCOSE, POCT (MANUAL RESULT ENTRY): POC Glucose: 95 mg/dl (ref 70–99)

## 2013-04-29 LAB — POCT GLYCOSYLATED HEMOGLOBIN (HGB A1C): Hemoglobin A1C: 4.9

## 2013-04-29 MED ORDER — LORAZEPAM 0.5 MG PO TABS: 0.5000 mg | ORAL_TABLET | Freq: Two times a day (BID) | ORAL | Status: DC | PRN

## 2013-04-29 NOTE — Patient Instructions (Addendum)
You do NOT have diabetes.  I think you are having after effects from the medicine.  But we are checking labs to make sure.   In the meantime, if you are having the anxiety, take the Lorazepam.  Do not drive with this medicine.  We will call you when the labs come back.    It was good to see you today

## 2013-04-29 NOTE — Progress Notes (Signed)
[facility] Kim [name] is a 43 y.o. female who presents to Urgent Care today with complaints of headache and malaise:  1.  Lightheadedness and anxiety:  Has been having lightheadedness and feeling more anxious with some malaise for the past month.  At that time she was started on anti-retrovirals after having an accidental contact with fluids (blood) from HIV+ family member.  Started on 1 months worth of anti-retrovirals.  She ended this course about 5 days ago.   Symptoms began roughly the same time as ARVs, began having lightheadedness and headaches.  Cannot remember if started before or after her medicine started.  Mostly concerned about anxiety over retest HIV in 2 months.    Of note,   Pt is NON-diabetic paitent (husband has a machine and is diabetic) but checked her blood sugars and these have been running ~200 for past 3 days.  Previously had never had a CBG greater than 100.  Last had something to eat today at 4 pm -- [place] soup.  Only has had water to drink since then.    ROS: No polyuria/polydipsia/polyphagia.  No dysuria.  No fevers or chills.  No weight loss or night sweats.    PMH reviewed.  Past Medical History  Diagnosis Date  . 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 Asthma     when pregnant   History reviewed. No pertinent past surgical history.  Medications reviewed. Current Outpatient Prescriptions  Medication Sig Dispense Refill  . diclofenac (VOLTAREN) 50 MG EC tablet Take 50 mg by mouth daily.      Marland Kitchen emtricitabine-tenofovir (TRUVADA) 200-300 MG per tablet Take 1 tablet by mouth once.  7 tablet  2  . meloxicam (MOBIC) 15 MG tablet Take 1 tablet (15 mg total) by mouth daily.  30 tablet  0  . omeprazole (PRILOSEC) 40 MG capsule Take 40 mg by mouth daily.      . raltegravir (ISENTRESS) 400 MG tablet Take 1 tablet (400 mg total) by mouth 2 (two) times daily.  14 tablet  2   No current facility-administered medications  for this visit.    ROS as above otherwise neg.  No chest pain, palpitations, SOB, Fever, Chills, Abd pain, N/V/D.   Physical Exam:  BP 120/74  Pulse 71  Temp(Src) 98.7 F (37.1 C) (Oral)  Resp 18  Ht 5\' 6"  (1.676 m)  Wt 202 lb (91.627 kg)  BMI 32.62 kg/m2  SpO2 99% Gen:  Alert, cooperative patient who appears stated age in no acute distress.  Vital signs reviewed. HEENT: EOMI,  MMM.   Neck:  No LAD Pulm:  Clear to auscultation bilaterally  Cardiac:  Regular rate and rhythm without murmur auscultated.  Good S1/S2. Abd:  Soft/nondistended/nontender.  Exts: No edema Bl LE's  Assessment and Plan: 1.  Malaise: - low liklihood, but will check to ensure she's not had any liver damage from antiretrovirals - Negative CBG and A1C here today, not diabetes - Encouraged to follow up for HIV testing  2.  Anxiety:  - main issue for today - Very concerned about outcomes of HIV test - short term use of Ativan to help with anxiety is worse.

## 2013-06-11 ENCOUNTER — Ambulatory Visit: Payer: BC Managed Care – PPO | Admitting: Gynecology

## 2013-06-26 ENCOUNTER — Ambulatory Visit (INDEPENDENT_AMBULATORY_CARE_PROVIDER_SITE_OTHER): Payer: BC Managed Care – PPO | Admitting: Gynecology

## 2013-06-26 ENCOUNTER — Encounter: Payer: Self-pay | Admitting: Gynecology

## 2013-06-26 ENCOUNTER — Other Ambulatory Visit (HOSPITAL_COMMUNITY)
Admission: RE | Admit: 2013-06-26 | Discharge: 2013-06-26 | Disposition: A | Discharge status: Home / Self Care | Payer: BC Managed Care – PPO | Source: Ambulatory Visit | Attending: Gynecology | Admitting: Gynecology

## 2013-06-26 VITALS — BP 126/78 | Ht 64.0 in | Wt 191.0 lb

## 2013-06-26 DIAGNOSIS — Z01419 Encounter for gynecological examination (general) (routine) without abnormal findings: Secondary | ICD-10-CM | POA: Insufficient documentation

## 2013-06-26 DIAGNOSIS — J392 Other diseases of pharynx: Secondary | ICD-10-CM

## 2013-06-26 DIAGNOSIS — Z23 Encounter for immunization: Secondary | ICD-10-CM

## 2013-06-26 DIAGNOSIS — Z1151 Encounter for screening for human papillomavirus (HPV): Secondary | ICD-10-CM | POA: Insufficient documentation

## 2013-06-26 LAB — CBC WITH DIFFERENTIAL/PLATELET
Basophils Absolute: 0 10*3/uL (ref 0.0–0.1)
Basophils Relative: 0 % (ref 0–1)
Eosinophils Absolute: 0.1 10*3/uL (ref 0.0–0.7)
Eosinophils Relative: 1 % (ref 0–5)
HCT: 38.5 % (ref 36.0–46.0)
Hemoglobin: 12.9 g/dL (ref 12.0–15.0)
Lymphocytes Relative: 16 % (ref 12–46)
Lymphs Abs: 1.2 10*3/uL (ref 0.7–4.0)
MCH: 29.1 pg (ref 26.0–34.0)
MCHC: 33.5 g/dL (ref 30.0–36.0)
MCV: 86.7 fL (ref 78.0–100.0)
Monocytes Absolute: 0.2 10*3/uL (ref 0.1–1.0)
Monocytes Relative: 2 % — ABNORMAL LOW (ref 3–12)
Neutro Abs: 6.3 10*3/uL (ref 1.7–7.7)
Neutrophils Relative %: 81 % — ABNORMAL HIGH (ref 43–77)
Platelets: 310 10*3/uL (ref 150–400)
RBC: 4.44 MIL/uL (ref 3.87–5.11)
RDW: 13.8 % (ref 11.5–15.5)
WBC: 7.8 10*3/uL (ref 4.0–10.5)

## 2013-06-26 LAB — COMPREHENSIVE METABOLIC PANEL
ALT: 9 U/L (ref 0–35)
AST: 11 U/L (ref 0–37)
Albumin: 4.1 g/dL (ref 3.5–5.2)
Alkaline Phosphatase: 70 U/L (ref 39–117)
BUN: 10 mg/dL (ref 6–23)
CO2: 24 mEq/L (ref 19–32)
Calcium: 9.2 mg/dL (ref 8.4–10.5)
Chloride: 104 mEq/L (ref 96–112)
Creat: 0.49 mg/dL — ABNORMAL LOW (ref 0.50–1.10)
Glucose, Bld: 111 mg/dL — ABNORMAL HIGH (ref 70–99)
Potassium: 4.2 mEq/L (ref 3.5–5.3)
Sodium: 138 mEq/L (ref 135–145)
Total Bilirubin: 0.4 mg/dL (ref 0.2–1.2)
Total Protein: 7.2 g/dL (ref 6.0–8.3)

## 2013-06-26 LAB — RAPID STREP SCREEN (MED CTR MEBANE ONLY): Streptococcus, Group A Screen (Direct): NEGATIVE

## 2013-06-26 LAB — CHOLESTEROL, TOTAL: Cholesterol: 160 mg/dL (ref 0–200)

## 2013-06-26 LAB — TSH: TSH: 1.442 u[IU]/mL (ref 0.350–4.500)

## 2013-06-26 MED ORDER — FLUCONAZOLE 150 MG PO TABS: 150.0000 mg | ORAL_TABLET | Freq: Once | ORAL | Status: DC

## 2013-06-26 MED ORDER — PENICILLIN V POTASSIUM 500 MG PO TABS: 500.0000 mg | ORAL_TABLET | Freq: Four times a day (QID) | ORAL | Status: DC

## 2013-06-26 NOTE — Progress Notes (Addendum)
[place] 10-01-1970 [id number]   History:    43 y.o.  for annual gyn exam who is a new patient to the practice. Patient would no complaints today. Although she has stated that in the past she has had recurrent yeast infections but was having no symptoms today. Patient is using condoms for contraception. Patient reports normal cycles. Her Pap smear has been over 3 years as well as her mammogram. Patient has informed me that approximately 2 months ago she was exposed in individual with HIV who had coughed up blood and landed on her face and she went to the emergency room at [facility] for treatment for blood exposure and was given prophylactic treatment. She is scheduled to return back to see them in the next couple weeks for follow HIV test.  Patient had laser treatment of her cervix for dysplasia back in the year 2000 in [place]. Patient states that subsequent Pap smears have been normal.  Past medical history,surgical history, family history and social history were all reviewed and documented in the EPIC chart.  Gynecologic History Patient's last menstrual period was 06/09/2013. Contraception: condoms Last Pap: Over 3 years ago. Results were: normal Last mammogram: Over 3 years ago. Results were: normal  Obstetric History OB History  Gravida Para Term Preterm AB SAB TAB Ectopic Multiple Living  4 4 4       4     # Outcome Date GA Lbr Len/2nd Weight Sex Delivery Anes PTL Lv  4 TRM           3 TRM           2 TRM           1 TRM                ROS: A ROS was performed and pertinent positives and negatives are included in the history.  GENERAL: No fevers or chills. HEENT: No change in vision, no earache, sore throat or sinus congestion. NECK: No pain or stiffness. CARDIOVASCULAR: No chest pain or pressure. No palpitations. PULMONARY: No shortness of breath, cough or wheeze. GASTROINTESTINAL: No abdominal pain, nausea, vomiting or diarrhea, melena or bright red blood per rectum.  GENITOURINARY: No urinary frequency, urgency, hesitancy or dysuria. MUSCULOSKELETAL: No joint or muscle pain, no back pain, no recent trauma. DERMATOLOGIC: No rash, no itching, no lesions. ENDOCRINE: No polyuria, polydipsia, no heat or cold intolerance. No recent change in weight. HEMATOLOGICAL: No anemia or easy bruising or bleeding. NEUROLOGIC: No headache, seizures, numbness, tingling or weakness. PSYCHIATRIC: No depression, no loss of interest in normal activity or change in sleep pattern.     Exam: chaperone present  BP 126/78  Ht 5\' 4"  (1.626 m)  Wt 191 lb (86.637 kg)  BMI 32.77 kg/m2  LMP 06/09/2013  Body mass index is 32.77 kg/(m^2).  General appearance : Well developed well nourished female. No acute distress HEENT: Peritonsillar plaque was noted on the left Lungs: Clear to auscultation, no rhonchi or wheezes, or rib retractions  Heart: Regular rate and rhythm, no murmurs or gallops Breast:Examined in sitting and supine position were symmetrical in appearance, no palpable masses or tenderness,  no skin retraction, no nipple inversion, no nipple discharge, no skin discoloration, no axillary or supraclavicular lymphadenopathy Abdomen: no palpable masses or tenderness, no rebound or guarding Extremities: no edema or skin discoloration or tenderness  Pelvic:  Bartholin, Urethra, Skene Glands: Within normal limits  Vagina: No gross lesions or discharge  Cervix: No gross lesions or discharge  Uterus  anteverted, normal size, shape and consistency, non-tender and mobile  Adnexa  Without masses or tenderness  Anus and perineum  normal   Rectovaginal  normal sphincter tone without palpated masses or tenderness             Hemoccult not indicated     Assessment/Plan:  43 y.o. female for annual exam who will need to followup with her PCP in the next few weeks for followup HIV test as a result of her exposure to her son who is HIV positive. The following labs ordered today:  CBC, screen cholesterol, comprehensive metabolic panel, urinalysis, and TSH as well as Pap smear. Incidental finding of a peritonsillar plaque was noted on her left tonsil for which a culture was obtained. Patient will receive her Tdap vaccine today. Patient was provided with requisition to schedule her mammogram. We discussed importance of monthly breast exams.  Because of the negative strep culture and low yield based on patient's finding I'm going to start her on Pen-Vee K 500 mg 4 times a day for 7 days.  Note: This dictation was prepared with  Dragon/digital dictation along withSmart phrase technology. Any transcriptional errors that result from this process are unintentional.   Terrance Mass MD, 12:30 PM 06/26/2013

## 2013-06-26 NOTE — Addendum Note (Signed)
Addended by: Terrance Mass on: 06/26/2013 03:37 PM   Modules accepted: Orders

## 2013-06-26 NOTE — Patient Instructions (Signed)
Vacuna contra el ttanos y la difteria (Td), Lo que debe saber  (Tetanus, Diphtheria (Td) Vaccine, What You Need to Know) PORQU VACUNARSE?  El ttanos  y la difteria son enfermedades muy graves. Actualmente son raras en los [place], pero las personas que se infectan suelen tener complicaciones graves. La vacuna Td se utiliza para proteccin contra estas enfermedades en adolescentes y adultos. Tanto el ttanos como la difteria son infecciones causadas por bacterias. La difteria se transmite de persona a persona a travs de la tos o el estornudo. El ttanos ingresa al organismo a travs de cortes, rasguos o heridas.  (Trismo) provoca la contraccin dolorosa de los msculos, por lo general, en todo el cuerpo.   Puede causar el endurecimiento de los msculos de la cabeza y el cuello, de modo que impide abrir la boca, tragar y en algunos casos, respirar. El ttanos causa la muerte de 1 de cada 5 personas que se infectan. La DIFTERIA hace que se forme una sustancia espesa en el fondo de la garganta.   Puede causar problemas respiratorios, parlisis, insuficiencia cardaca e incluso la muerte. Antes de las vacunas, en los [place] se vieron ms de 200.000 casos al ao de difteria y cientos de casos de ttanos. Desde que comenz la vacunacin, el nmero de casos en ambas enfermedades ha disminuido en un 99%.  VACUNA TD  La vacuna Tdap protege a adolescentes y adultos contra el ttanos y la difteria. La Td generalmente se administra como refuerzo cada 10 aos pero tambin puede aplicarse antes luego de sufrir una herida o quemadura sucia y grave.  El mdico le dar ms informacin.  La Td puede administrarse de manera segura simultneamente con otras vacunas.  ALGUNAS PERSONAS NO DEBEN RECIBIR ESTA VACUNA  Si alguna vez tuvo una reaccin alrgica potencialmente mortal despus de una dosis de la vacuna contra el ttanos, la difteria o la tos ferina, o tuvo una alergia grave a cualquiera de  los componentes de esta vacuna, no debe aplicarse la vacuna. Informe a su mdico si usted sufre algn tipo de alergia grave.  Consulte con su mdico si:  tiene epilepsia u otra enfermedad del sistema nervioso,  siente dolor intenso o se hincha despus de recibir cualquier vacuna contra la difteria, el ttanos o la tos ferina,  ha tenido el sndrome de Guillain Barr (GBS por sus siglas en ingls).  no se siente bien el da en que se ha programado la vacuna. RIESGOS DE UNA REACCIN A LA VACUNA Con la vacuna, como cualquier medicamento, existe la posibilidad de sufrir efectos secundarios. Suelen ser leves y desaparecen por s solos.  Los efectos secundarios graves son tambin posibles, pero son muy raros.  La mayora de las personas a los que se aplica esta vacuna no tienen ningn problema.  Problemas leves  luego de aplicarse la Td  (no han interferido con las actividades)   Dolor en el lugar de la inyeccin (8 de cada 10 personas).  Enrojecimiento o hinchazn en el lugar de la inyeccin (1 de cada 3 personas).  Dolor de cabeza (alrededor de 1 cada 15 personas).  Dolor de cabeza o cansancio (poco frecuente). Problemas moderados  luego de aplicarse la Td  (han interferido con las actividades, pero no requieren atencin mdica)   La temperatura es de ms de 102 F (38.9 C). Problemas graves  luego de aplicarse la Td  (no puede realizar las actividades habituales, requiere atencin mdica)   Inflamacin, dolor intenso, sangrado o irritacin   en el brazo, en el sitio de la inyeccin (poco frecuente). Problemas que podran ocurrir despus de cualquier vacuna:   Despus de cualquier procedimiento mdico pueden ocurrir AGCO Corporationdesmayos breves, incluso despus de aplicarse una vacuna. Para prevenir desmayos y lesiones causadas por la cada puede sentarse o recostarse por unos 15 minutos. Informe a su mdico si se siente mareado, tiene cambios en la visin o zumbidos en los odos.  En raras  ocasiones puede haber dolor intenso en el hombro y limitacin de la amplitud de movimientos del brazo en el que le aplicaron la vacunas.  Las Therapist, artreacciones alrgicas graves a la vacuna son Lynnae Sandhoffmuy raras y se estima en menos de 1 en un milln de dosis. Si ocurriera, sera dentro de unos pocos minutos o unas pocas horas de haberse vacunado. QU PASA SI HAY UNA REACCIN GRAVE?  Qu signos debo buscar?  Observe todo lo que le preocupe, como signos de una reaccin alrgica grave, fiebre muy alta o cambios en el comportamiento. Los signos de Runner, broadcasting/film/videouna reaccin alrgica grave pueden incluir urticaria, hinchazn de la cara y la garganta, dificultad para respirar, ritmo cardaco acelerado, mareos y debilidad. Generalmente comienzan entre unos pocos minutos y algunas horas despus de la vacunacin.  Qu debo hacer?  Si usted piensa que se trata de una reaccin alrgica grave o de otra emergencia que no puede esperar, llame al 911 o llvelo al hospital ms cercano. De lo contrario, llame a su mdico.  Despus, la reaccin debe informarse a la "Vaccine Adverse Event Reporting System" Sistema de informacin sobre efectos adversos de las vacunas (VAERS). Su mdico puede presentar este informe, o puede hacerlo usted mismo a travs del sitio web de VAERS o llamando al [phone]. El VAERS es slo para Biomedical engineerinformar reacciones. No brindan consejo mdico. PROGRAMA NACIONAL DE COMPENSACIN DE DAOS POR VACUNAS  El National Vaccine Injury Compensation Program (VICP) es un programa federal que fue creado para compensar a las personas que puedan haber sufrido daos al recibir ciertas vacunas.  Aquellas personas que consideren que han sufrido un dao como consecuencia de una vacuna y quieren saber ms acerca del programa y como presentar [place], [place]apueden llamar al [phone] o visitar su sitio web del VICP.  CMO PUEDO OBTENER MS INFORMACIN?   Consulte a su mdico.  Pngase en contacto con el servicio de salud de su  localidad o su estado.  Comunquese con los Centros para el control y la prevencin de Child psychotherapistenfermedades (Centers for Disease Control and Prevention, CDC).  Llame al [phone] (1-800-CDC-INFO)  Visite el sitio web del CDC. CDC Inactivated Influenza Vaccine Interim VIS (05/22/12)  Document Released: 07/21/2008 Document Revised: 07/30/2012 ExitCare Patient Information 2014 [place], [place]. Exmenes De Faringitis Estreptoccica (Strep Throat Tests) Aunque la mayor parte de los dolores de garganta son causados por virus, en algunos casos el origen es una bacteria llamada Estreptococo del grupo A (amigdalitis estreptoccica). Es importante determinar la causa debido a que la bacteria del estreptococo se trata con antibiticos. Hay 2 tipos de exmenes para identificar la amigdalitis estreptoccica: una prueba rpida para estreptococos y un cultivo de secrecin de la garganta. Ambos exmenes se realizan frotando un hisopo en la zona posterior de la garganta, y luego se Lao People's Democratic Republicutilizan qumicos para identificar el tipo de bacteria Westonpresente. La prueba rpida para estreptococos dura entre 10 y 20 minutos. Si el resultado es negativo, deber realizarse un cultivo de la secrecin de la garganta. Para realizar el cultivo, se [place] ([place]) Islandsutiliza un hisopo para Landscape architectrecolectar las bacterias que  luego se colocan en una plaqueta con gel y se dejan desarrollar en el laboratorio, lo que puede demorar entre 1 y 2 das. En algunos casos, el cultivo Regulatory affairs officer que no fue hallado en la prueba rpida. Si el resultado de la prueba rpida para estreptococos es positivo, no es Paramedic, y Writer que lo asiste le indicar antibiticos. Durante su visita no contar con todos los [employer]. En este caso, tenga otra entrevista con su mdico para conocerlos. No piense que el resultado es normal si no tiene noticias de su mdico o de la institucin mdica. Es Building services engineer seguimiento de todos  los [place].  SOLICITE ATENCIN MDICA SI:  Sus sntomas no mejoran en 1  2 Vander.  Tiene preguntas o preocupaciones. [place] DE INMEDIATO SI:  Tiene problemas para tragar y estos empeoran.  Presenta dificultades respiratorias.  Tiene fiebre. Document Released: 04/04/2005 Document Revised: 06/27/2011 [facility] Patient Information 2014 [place], [place].  Autoexamen de [employer]  Teacher, English as a foreign language) El autoexamen de mamas puede detectar problemas de manera temprana, prevenir complicaciones mdicas significativas y posiblemente salvar su vida. Al hacerlo, podr familiarizarse con el aspecto y forma de sus [place], y observar cambios. Esto le permite descubrir cambios de manera precoz. Este autoexamen [employer]ellation [employer]ands ofrece la tranquilidad de que sus senos estn en buen [place] de [place]. Una forma de aprender qu es normal para sus mamas y si sufren modificaciones es Field seismologist un autoexamen.   Si encuentra un bulto o algo que no estaba presente anteriormente, lo mejor es ponerse en contacto con su mdico inmediatamente. Otro hallazgo que debe ser evaluado por su mdico es la secrecin del pezn, especialmente si es con sangre; cambios en la piel o enrojecimiento; reas donde la piel parece estar tironeada (retrada) o nuevos bultos o protuberancias. El dolor en los senos es rara vez se asocia con el cncer (malignidad), pero tambin debe ser evaluado por un mdico.  CMO REALIZAR EL AUTOEXAMEN DE MAMAS  El mejor momento para examinar sus mamas es a los 5 a 7 das despus de finalizado el perodo menstrual. Durante la menstruacin, las mamas estn ms abultadas y puede haber ms dificultad para Pension scheme manager modificaciones. Si no menstra, ha llegado a la menopausia, o le han extirpado el tero (histerectomia), usted debe examinar sus senos a intervalos regulares, por ejemplo cada mes. Si est amamantando, examine sus senos despus de alimentar al beb o despus de usar  un extractor de [place]. Los implantes mamarios no disminuyen el riesgo de bultos o tumores, por lo que debe seguir realizando el autoexamen de [employer] Corporation se recomienda. Hable con su mdico acerca de cmo determinar la diferencia entre el implante y el tejido [place]. Adems, debe consultar cuanta presin debe hacer durante el examen. Con el tiempo se familiarizar con las variaciones de las mamas y se sentir ms cmoda para Restaurant manager, fast food. Para el autoexamen deber quitarse toda la ropa de la cintura para [place].  1.  Observe sus senos y pezones. Prese frente a un espejo en una habitacin con buena iluminacin. Con las Cardinal Health caderas, presione las manos firmemente [place]. Busque diferencias en la forma, el contorno y el tamao de un pecho al otro (asimetras). [place] asimetras se incluyen arrugas, depresiones o protuberancias. Tambin, busque cambios en la piel, como reas enrojecidas o escamosas. Busque cambios en los pezones, como secreciones, hoyuelos, cambios en la posicin, o enrojecimiento. 2. [place]  cuidadosamente sus senos. Es mucho mejor [employer] en la ducha o en la baera, [place] jabn o cuando est recostada sobre su espalda. Coloque el brazo (en el lado de la mama que se examina) por arriba de la cabeza. Use las yemas (no las puntas) de los tres dedos centrales de la mano opuesta para palpar. Comience en la zona de la axila, haga crculos de  de pulgada (2 cm) y vaya superponindolos. Utilice 3 niveles diferentes de presin (ligero, medio y [place]) en cada crculo antes de pasar al siguiente. Se necesita una presin ligera para sentir los tejidos ms cercanos a la piel. La presin media ayudar a sentir el tejido [employer] un poco ms profundo, mientras que se necesita una presin firme para palpar el tejido que se encuentra cerca de las [place]. Continuar superponiendo crculos y vaya hacia abajo, hasta sentir las [place], por debajo del [place]. Luego mueva un espacio del  ancho de un dedo hacia el centro del cuerpo. Siga con los crculos del  de pulgada (2 cm) mientras va lentamente hacia la clavcula, cerca de la base del cuello. Contine con el examen hacia arriba y hacia abajo con las 3 intensidades de presin Librarian, academic a la mitad del pecho. Hgalo con cada seno cuidadosamente, buscando bultos o modificaciones. 3. Debe llevar un registro escrito con los cambios o los hallazgos normales que encuentre para cada seno. Si registra esta informacin, no tiene que depender slo de la memoria para Financial trader, la sensibilidad o la ubicacin de los [place]. Anote en qu momento se encuentra del ciclo menstrual, si usted todava est menstruando. El tejido [employer] puede tener algunos bultos o tejidos engrosados. Sin embargo, consulte a su mdico si usted Location manager.   SOLICITE ATENCIN MDICA SI:   Observa cambios en la forma, en el contorno o el tamao de las mamas o los pezones.   Hay modificaciones en la piel, como zonas enrojecidas o escamosas en las mamas o en los pezones.   Tiene una secrecin anormal en los pezones.   Siente un nuevo bulto o reas engrosadas de [place] anormal.  Document Released: 04/04/2005 Document Revised: 03/21/2012 [facility] Patient Information 2014 [place], [place].

## 2013-06-27 LAB — URINALYSIS W MICROSCOPIC + REFLEX CULTURE
Bilirubin Urine: NEGATIVE
Casts: NONE SEEN
Crystals: NONE SEEN
Glucose, UA: NEGATIVE mg/dL
Hgb urine dipstick: NEGATIVE
Ketones, ur: NEGATIVE mg/dL
Nitrite: NEGATIVE
Protein, ur: NEGATIVE mg/dL
Specific Gravity, Urine: 1.017 (ref 1.005–1.030)
Urobilinogen, UA: 0.2 mg/dL (ref 0.0–1.0)
pH: 7 (ref 5.0–8.0)

## 2013-06-28 ENCOUNTER — Other Ambulatory Visit: Payer: Self-pay | Admitting: Gynecology

## 2013-06-28 DIAGNOSIS — R3129 Other microscopic hematuria: Secondary | ICD-10-CM

## 2013-06-28 LAB — URINE CULTURE: Colony Count: >=100000

## 2013-06-30 LAB — CULTURE, GROUP A STREP: Organism ID, Bacteria: NORMAL

## 2013-07-03 ENCOUNTER — Other Ambulatory Visit: Payer: Self-pay | Admitting: Gynecology

## 2013-07-03 MED ORDER — NITROFURANTOIN MONOHYD MACRO 100 MG PO CAPS: 100.0000 mg | ORAL_CAPSULE | Freq: Two times a day (BID) | ORAL | Status: DC

## 2013-07-11 ENCOUNTER — Other Ambulatory Visit: Payer: Self-pay

## 2013-07-11 DIAGNOSIS — Z1231 Encounter for screening mammogram for malignant neoplasm of breast: Secondary | ICD-10-CM

## 2013-07-29 ENCOUNTER — Other Ambulatory Visit: Payer: BC Managed Care – PPO

## 2013-07-29 DIAGNOSIS — R3129 Other microscopic hematuria: Secondary | ICD-10-CM

## 2013-07-30 LAB — URINALYSIS W MICROSCOPIC + REFLEX CULTURE
Bacteria, UA: NONE SEEN
Bilirubin Urine: NEGATIVE
Casts: NONE SEEN
Crystals: NONE SEEN
Glucose, UA: NEGATIVE mg/dL
Ketones, ur: NEGATIVE mg/dL
Leukocytes, UA: NEGATIVE
Nitrite: NEGATIVE
Protein, ur: NEGATIVE mg/dL
Specific Gravity, Urine: 1.023 (ref 1.005–1.030)
Urobilinogen, UA: 0.2 mg/dL (ref 0.0–1.0)
pH: 5.5 (ref 5.0–8.0)

## 2013-08-01 ENCOUNTER — Ambulatory Visit
Admission: RE | Admit: 2013-08-01 | Discharge: 2013-08-01 | Disposition: A | Discharge status: Home / Self Care | Payer: BC Managed Care – PPO | Source: Ambulatory Visit

## 2013-08-01 DIAGNOSIS — Z1231 Encounter for screening mammogram for malignant neoplasm of breast: Secondary | ICD-10-CM

## 2014-02-17 ENCOUNTER — Encounter: Payer: Self-pay | Admitting: Gynecology

## 2014-03-31 ENCOUNTER — Ambulatory Visit (INDEPENDENT_AMBULATORY_CARE_PROVIDER_SITE_OTHER): Payer: 59 | Admitting: Internal Medicine

## 2014-03-31 VITALS — BP 114/72 | HR 65 | Temp 98.2°F | Resp 16 | Ht 65.5 in | Wt 195.6 lb

## 2014-03-31 DIAGNOSIS — N912 Amenorrhea, unspecified: Secondary | ICD-10-CM

## 2014-03-31 DIAGNOSIS — R102 Pelvic and perineal pain: Secondary | ICD-10-CM

## 2014-03-31 DIAGNOSIS — R635 Abnormal weight gain: Secondary | ICD-10-CM

## 2014-03-31 LAB — POCT CBC
Granulocyte percent: 65.6 %G (ref 37–80)
HCT, POC: 41.2 % (ref 37.7–47.9)
Hemoglobin: 13.3 g/dL (ref 12.2–16.2)
Lymph, poc: 3.2 (ref 0.6–3.4)
MCH, POC: 29.1 pg (ref 27–31.2)
MCHC: 32.2 g/dL (ref 31.8–35.4)
MCV: 90.3 fL (ref 80–97)
MID (cbc): 0.7 (ref 0–0.9)
MPV: 6.9 fL (ref 0–99.8)
POC Granulocyte: 7.3 — AB (ref 2–6.9)
POC LYMPH PERCENT: 28.4 %L (ref 10–50)
POC MID %: 6 %M (ref 0–12)
Platelet Count, POC: 266 10*3/uL (ref 142–424)
RBC: 4.56 M/uL (ref 4.04–5.48)
RDW, POC: 14.5 %
WBC: 11.2 10*3/uL — AB (ref 4.6–10.2)

## 2014-03-31 LAB — POCT URINALYSIS DIPSTICK
Bilirubin, UA: NEGATIVE
Glucose, UA: NEGATIVE
Ketones, UA: NEGATIVE
Nitrite, UA: NEGATIVE
Protein, UA: NEGATIVE
Spec Grav, UA: 1.01
Urobilinogen, UA: 0.2
pH, UA: 5.5

## 2014-03-31 LAB — POCT WET PREP WITH KOH
KOH Prep POC: NEGATIVE
Trichomonas, UA: NEGATIVE
Yeast Wet Prep HPF POC: NEGATIVE

## 2014-03-31 LAB — POCT UA - MICROSCOPIC ONLY
Casts, Ur, LPF, POC: NEGATIVE
Crystals, Ur, HPF, POC: NEGATIVE
Mucus, UA: NEGATIVE
Yeast, UA: NEGATIVE

## 2014-03-31 LAB — POCT URINE PREGNANCY: Preg Test, Ur: POSITIVE

## 2014-03-31 NOTE — Progress Notes (Signed)
   Subjective:    Patient ID: [facility], female    DOB: [birth year], 43 y.o.   MRN: [id number]  HPI complaining of right lower quadrant abdominal or pelvic pain intermittently for the past 30 days  Last menstrual period in October  Has had some breast tenderness  As well/  Married- pregnancy is a possibility  has had some mild nausea intermittently// has gained weight over the past month  No vaginal discharge No additional urine frequency No new sexual partners  Husband helping to translate Review of Systems no vomiting or diarrhea    No chest pain or palpitations  No cough or shortness of breath Objective:   Physical Exam BP 114/72 mmHg  Pulse 65  Temp(Src) 98.2 F (36.8 C) (Oral)  Resp 16  Ht 5' 5.5" (1.664 m)  Wt 195 lb 9.6 oz (88.724 kg)  BMI 32.04 kg/m2  SpO2 100%  LMP 02/04/2014 (Exact Date)  no acute distress  HEENT clear  Heart regular  Abd-soft, NT, ND w/out omeg or masses Tender RLQ to palp but neg rebound/perc   Urine hCG reported as negative      pelvic examination by PA Jacqulynn Cadet was normal without right lower quadrant or pelvic mass or tenderness   Assessment & Plan:  Amenorrhea Pelvic pain Weight gain Breast tenderness   this seems like it should be pregnancy so I'll get a serum hCG but also TSH    Addendum  At 6:10 PM second shift lab indicated that the study above had been done on the wrong urine and when they redid the test she was positive so her outside labs were canceled

## 2014-04-01 ENCOUNTER — Other Ambulatory Visit: Payer: Self-pay

## 2014-04-01 ENCOUNTER — Telehealth: Payer: Self-pay

## 2014-04-01 DIAGNOSIS — Z3201 Encounter for pregnancy test, result positive: Secondary | ICD-10-CM

## 2014-04-01 LAB — GC/CHLAMYDIA PROBE AMP
CT Probe RNA: NEGATIVE
GC Probe RNA: NEGATIVE

## 2014-04-01 NOTE — Telephone Encounter (Signed)
-----   Message from Leandrew Koyanagi, MD sent at 04/01/2014  2:40 PM EST ----- I called this patient convey that her pregnancy test was in fact positive and find out what she would like to do. There was no answer and the call indicated that this might be a wrong number disconnected number and somewhat. She does not speaking much although her husband translated last night and she has other family members who translate. Please call and get in touch with her so she knows that she is in fact pregnant and needs some kind of follow-up based on her decision about the pregnancy. If the number seems to be an erroneous number he might look up her other family members including a daughter with the same name that you can contact

## 2014-04-01 NOTE — Telephone Encounter (Signed)
Called and spoke to pt's husband. He understood that she was pregnancy. I told him we will be referring her to an OB/GYN to follow up with. He was okay with the referral.

## 2014-04-08 ENCOUNTER — Telehealth: Payer: Self-pay | Admitting: General Practice

## 2014-04-08 DIAGNOSIS — O3680X1 Pregnancy with inconclusive fetal viability, fetus 1: Secondary | ICD-10-CM

## 2014-04-08 NOTE — Telephone Encounter (Signed)
Patient called wanting to schedule appointment to start prenatal care. Chart reviewed, by LMP patient is 9w with EDD 11/11/14. Patient needs ultrasound scheduled for viability & appt in our office. Called patient with [name] for interpreter, no answer- will try patient later.

## 2014-04-15 ENCOUNTER — Encounter: Payer: Self-pay | Admitting: Obstetrics and Gynecology

## 2014-04-16 ENCOUNTER — Ambulatory Visit (INDEPENDENT_AMBULATORY_CARE_PROVIDER_SITE_OTHER): Payer: 59 | Admitting: Physician Assistant

## 2014-04-16 VITALS — BP 106/70 | HR 74 | Temp 97.8°F | Resp 16 | Ht 66.0 in | Wt 197.6 lb

## 2014-04-16 DIAGNOSIS — N3001 Acute cystitis with hematuria: Secondary | ICD-10-CM

## 2014-04-16 DIAGNOSIS — R300 Dysuria: Secondary | ICD-10-CM

## 2014-04-16 LAB — POCT URINALYSIS DIPSTICK
Bilirubin, UA: NEGATIVE
Glucose, UA: NEGATIVE
Ketones, UA: NEGATIVE
Nitrite, UA: NEGATIVE
Protein, UA: 30
Spec Grav, UA: 1.02
Urobilinogen, UA: 0.2
pH, UA: 7

## 2014-04-16 LAB — POCT UA - MICROSCOPIC ONLY
Casts, Ur, LPF, POC: NEGATIVE
Crystals, Ur, HPF, POC: NEGATIVE
Mucus, UA: NEGATIVE
Yeast, UA: NEGATIVE

## 2014-04-16 MED ORDER — CEPHALEXIN 500 MG PO CAPS: 500.0000 mg | ORAL_CAPSULE | Freq: Two times a day (BID) | ORAL | Status: AC

## 2014-04-16 NOTE — Patient Instructions (Signed)
Take antibiotic until finished. Drink plenty of water and cranberry juice. Return if not better after antibiotic. Follow up with OB/GYN on January 12th.Infeccin urinaria  (Urinary Tract Infection)  La infeccin urinaria puede ocurrir en Clinical cytogeneticist del tracto urinario. El tracto urinario es un sistema de drenaje del cuerpo por el que se eliminan los desechos y el exceso de [place]. El tracto urinario est formado por dos riones, dos urteres, la vejiga y Geologist, engineering. Los riones son rganos que tienen forma de frijol. Cada rin tiene aproximadamente el tamao del puo. Estn situados debajo de las [place], uno a cada lado de la columna vertebral CAUSAS  La causa de la infeccin son los microbios, que son organismos microscpicos, que incluyen hongos, virus, y bacterias. Estos organismos son tan pequeos que slo pueden verse a travs del microscopio. Las bacterias son los microorganismos que ms comnmente causan infecciones urinarias.  SNTOMAS  Los sntomas pueden variar segn la edad y el sexo del paciente y por la ubicacin de la infeccin. Los sntomas en las mujeres jvenes incluyen la necesidad frecuente e intensa de orinar y una sensacin dolorosa de ardor en la vejiga o en la uretra durante la miccin. Las mujeres y los hombres mayores podrn sentir cansancio, temblores y debilidad y Arts development officer musculares y Social research officer, government abdominal. Si tiene [place], puede significar que la infeccin est en los riones. Otros sntomas son dolor en la espalda o en los lados debajo de las [place], nuseas y vmitos.  DIAGNSTICO  Para diagnosticar una infeccin urinaria, el mdico le preguntar acerca de sus sntomas. [employer] una Dieterich de [place]. La muestra de orina se analiza para [name] bacterias y glbulos blancos de Herbalist. Los glbulos blancos se forman en el organismo para ayudar a Radio broadcast assistant las infecciones.  TRATAMIENTO  Por lo general, las infecciones urinarias pueden tratarse con  medicamentos. Debido a que la [employer] de las infecciones son causadas por bacterias, por lo general pueden tratarse con antibiticos. La eleccin del antibitico y la duracin del tratamiento depender de sus sntomas y el tipo de bacteria causante de la infeccin.  INSTRUCCIONES PARA EL CUIDADO EN EL HOGAR   Si le recetaron antibiticos, tmelos exactamente como su mdico le indique. Termine el medicamento aunque se sienta mejor despus de haber tomado slo algunos.  Beba gran cantidad de lquido para mantener la orina de tono claro o color amarillo plido.  Evite la cafena, el t y las bebidas gaseosas. Estas sustancias irritan la vejiga.  Vaciar la vejiga con frecuencia. Evite retener la orina durante largos perodos.  Vace la vejiga antes y despus de Clinical biochemist.  Despus de mover el intestino, las mujeres deben higienizarse la regin perineal desde adelante hacia atrs. Use slo un papel tissue por vez. SOLICITE ATENCIN MDICA SI:   Siente dolor en la espalda.  Le sube la fiebre.  Los sntomas no mejoran luego de 3 das. SOLICITE ATENCIN MDICA DE INMEDIATO SI:   Siente dolor intenso en la espalda o en la zona inferior del abdomen.  Comienza a sentir escalofros.  Tiene nuseas o vmitos.  Tiene una sensacin continua de quemazn o molestias al [employer]. ASEGRESE DE QUE:   Comprende estas instrucciones.  Controlar su enfermedad.  Solicitar ayuda de inmediato si no mejora o empeora. Document Released: 01/12/2005 Document Revised: 12/28/2011 [facility] Patient Information 2015 [place]. This information is not intended to replace advice given to you by your health care provider. Make sure you discuss any questions you have  with your health care provider.  

## 2014-04-16 NOTE — Progress Notes (Signed)
Subjective:    Patient ID: Kim [name], female    DOB: 03-18-1971, 43 y.o.   MRN: [id number]  HPI  This is a 43 year old [redacted] week pregnant female who is presenting with dysuria and urinary frequency x 2 days. She last had a UTI 10 months ago and was treated successfully with antibiotics. She is endorsing some more long-term symptoms as well - abdominal heaviness x 1 month and tailbone "tickling" for 1 month as well. She has felt nauseated for 2 months but no vomiting or diarrhea. She denies hematuria, vaginal discharge, vaginal bleeding, severe abdominal pain, fever or chills. She has her first OB appt. On Jan 12th. She has had 4 live births. LMP 02/04/14.  Review of Systems  Constitutional: Negative for fever and chills.  Respiratory: Negative for shortness of breath.   Gastrointestinal: Positive for nausea and abdominal pain. Negative for vomiting and diarrhea.  Genitourinary: Positive for dysuria and frequency. Negative for hematuria, vaginal bleeding and vaginal discharge.  Musculoskeletal: Positive for back pain.  Skin: Negative for rash.  Neurological: Negative for dizziness.   Patient Active Problem List   Diagnosis Date Noted  . Right knee pain 11/11/2011  . Obesity 11/11/2011  . History of abnormal Pap smear 11/11/2011   Home meds: None  No Known Allergies  Patient's social and family history were reviewed.    Objective:   Physical Exam  Constitutional: She is oriented to person, place, and time. She appears well-developed and well-nourished. No distress.  HENT:  Head: Normocephalic and atraumatic.  Right Ear: Hearing normal.  Left Ear: Hearing normal.  Nose: Nose normal.  Eyes: Conjunctivae and lids are normal. Right eye exhibits no discharge. Left eye exhibits no discharge. No scleral icterus.  Cardiovascular: Normal rate, regular rhythm, normal heart sounds, intact distal pulses and normal pulses.   No murmur heard. Pulmonary/Chest: Effort normal and  breath sounds normal. No respiratory distress. She has no wheezes. She has no rhonchi. She has no rales.  Abdominal: Soft. Normal appearance. There is tenderness in the suprapubic area. There is no CVA tenderness.  Musculoskeletal: Normal range of motion.       Lumbar back: Normal. She exhibits normal range of motion and no tenderness.  Neurological: She is alert and oriented to person, place, and time.  Skin: Skin is warm, dry and intact. No lesion and no rash noted.  Psychiatric: She has a normal mood and affect. Her speech is normal and behavior is normal. Thought content normal.   Results for orders placed or performed in visit on 04/16/14  POCT urinalysis dipstick  Result Value Ref Range   Color, UA yellow    Clarity, UA cloudy    Glucose, UA neg    Bilirubin, UA neg    Ketones, UA neg    Spec Grav, UA 1.020    Blood, UA moderate    pH, UA 7.0    Protein, UA 30    Urobilinogen, UA 0.2    Nitrite, UA neg    Leukocytes, UA moderate (2+)   POCT UA - Microscopic Only  Result Value Ref Range   WBC, Ur, HPF, POC 3-4    RBC, urine, microscopic 4-8    Bacteria, U Microscopic trace    Mucus, UA neg    Epithelial cells, urine per micros 1-5    Crystals, Ur, HPF, POC neg    Casts, Ur, LPF, POC neg    Yeast, UA neg  Assessment & Plan:  1. Dysuria 2. Acute cystitis with hematuria UA suggests UTI. Urine culture pending. Prior urine culture showed E. Coli sensitive to keflex so prescription given. She will follow up with OB and discuss other symptoms with them. Advised if abdominal/back pain becomes severe she should go to the ED.  - POCT urinalysis dipstick - POCT UA - Microscopic Only - cephALEXin (KEFLEX) 500 MG capsule; Take 1 capsule (500 mg total) by mouth 2 (two) times daily.  Dispense: 14 capsule; Refill: 0 - Urine culture   Benjaman Pott. Drenda Freeze, M[facility]  04/16/2014

## 2014-04-17 NOTE — Telephone Encounter (Signed)
[place] scheduled on January 6th @ 2 pm.  Called pt with Kim [name], [name] interpreter, and left message that she has an appt scheduled with Radiology at the [facility] on January 6th @ 2 pm.  If she has any questions to please give [place] a call back.  Letter sent.

## 2014-04-18 LAB — URINE CULTURE: Colony Count: >=100000

## 2014-04-18 NOTE — L&D Delivery Note (Signed)
Delivery Note At  a viable unspecified sex was delivered via  (Presentation: ;  ).  APGAR: , ; weight  .   Placenta status: , .  Cord:  with the following complications: .  Cord pH: not done  Anesthesia:   Episiotomy:   Lacerations:   Suture Repair: 2.0 Est. Blood Loss (mL):    Mom to postpartum.  Baby to Couplet care / Skin to Skin.  MARSHALL,BERNARD A 11/04/2014, 11:23 AM

## 2014-04-23 ENCOUNTER — Ambulatory Visit (HOSPITAL_COMMUNITY)
Admission: RE | Admit: 2014-04-23 | Discharge: 2014-04-23 | Disposition: A | Discharge status: Home / Self Care | Payer: 59 | Source: Ambulatory Visit | Attending: Obstetrics and Gynecology | Admitting: Obstetrics and Gynecology

## 2014-04-23 DIAGNOSIS — O3411 Maternal care for benign tumor of corpus uteri, first trimester: Secondary | ICD-10-CM | POA: Diagnosis not present

## 2014-04-23 DIAGNOSIS — Z36 Encounter for antenatal screening of mother: Secondary | ICD-10-CM | POA: Insufficient documentation

## 2014-04-23 DIAGNOSIS — D259 Leiomyoma of uterus, unspecified: Secondary | ICD-10-CM | POA: Insufficient documentation

## 2014-04-23 DIAGNOSIS — Z3A11 11 weeks gestation of pregnancy: Secondary | ICD-10-CM | POA: Diagnosis not present

## 2014-04-23 DIAGNOSIS — O3680X1 Pregnancy with inconclusive fetal viability, fetus 1: Secondary | ICD-10-CM

## 2014-04-29 ENCOUNTER — Encounter: Payer: Self-pay | Admitting: Obstetrics and Gynecology

## 2014-04-29 ENCOUNTER — Ambulatory Visit (INDEPENDENT_AMBULATORY_CARE_PROVIDER_SITE_OTHER): Payer: 59 | Admitting: Obstetrics and Gynecology

## 2014-04-29 VITALS — BP 115/69 | HR 85 | Temp 97.5°F | Wt 200.8 lb

## 2014-04-29 DIAGNOSIS — Z113 Encounter for screening for infections with a predominantly sexual mode of transmission: Secondary | ICD-10-CM

## 2014-04-29 DIAGNOSIS — Z3491 Encounter for supervision of normal pregnancy, unspecified, first trimester: Secondary | ICD-10-CM

## 2014-04-29 DIAGNOSIS — Z118 Encounter for screening for other infectious and parasitic diseases: Secondary | ICD-10-CM

## 2014-04-29 DIAGNOSIS — O09529 Supervision of elderly multigravida, unspecified trimester: Secondary | ICD-10-CM | POA: Insufficient documentation

## 2014-04-29 DIAGNOSIS — Z23 Encounter for immunization: Secondary | ICD-10-CM

## 2014-04-29 DIAGNOSIS — Z3490 Encounter for supervision of normal pregnancy, unspecified, unspecified trimester: Secondary | ICD-10-CM | POA: Insufficient documentation

## 2014-04-29 DIAGNOSIS — E669 Obesity, unspecified: Secondary | ICD-10-CM

## 2014-04-29 DIAGNOSIS — O99211 Obesity complicating pregnancy, first trimester: Secondary | ICD-10-CM

## 2014-04-29 DIAGNOSIS — O09521 Supervision of elderly multigravida, first trimester: Secondary | ICD-10-CM

## 2014-04-29 DIAGNOSIS — O99210 Obesity complicating pregnancy, unspecified trimester: Secondary | ICD-10-CM | POA: Insufficient documentation

## 2014-04-29 LAB — POCT URINALYSIS DIP (DEVICE)
Bilirubin Urine: NEGATIVE
Glucose, UA: NEGATIVE mg/dL
Nitrite: NEGATIVE
Protein, ur: NEGATIVE mg/dL
Specific Gravity, Urine: >=1.03 (ref 1.005–1.030)
Urobilinogen, UA: 0.2 mg/dL (ref 0.0–1.0)
pH: 6 (ref 5.0–8.0)

## 2014-04-29 LAB — OB RESULTS CONSOLE GC/CHLAMYDIA
Chlamydia: NEGATIVE
Gonorrhea: NEGATIVE

## 2014-04-29 MED ORDER — PRENATAL VITAMINS 0.8 MG PO TABS: 1.0000 | ORAL_TABLET | Freq: Every day | ORAL | Status: DC

## 2014-04-29 NOTE — Progress Notes (Signed)
Initial prenatal visit Flu vaccine Pt is concerned about abdominal pain she is experiencing Raquel Technical sales engineer for encounter.  Pt left without receiving flu vaccine, will give at next visit.

## 2014-04-29 NOTE — Patient Instructions (Signed)
Primer trimestre de Media planner (First Trimester of Pregnancy) El primer trimestre de Media planner se extiende desde la semana1 hasta el final de la semana12 (mes1 al mes3). Una semana despus de que un espermatozoide fecunda un vulo, este se implantar en la pared uterina. Este embrin comenzar a Medical laboratory scientific officer convertirse en un beb. Sus genes y los de su pareja forman el beb. Los genes del varn determinan si ser un nio o una nia. Entre la semana6 y [place], se forman los ojos y [place], y los latidos del corazn pueden verse en la ecografa. Al final de las 12semanas, todos los rganos del beb estn formados.  Ahora que est embarazada, querr hacer todo lo que est a su alcance para tener un beb sano. Dos de las cosas ms importantes son Lucilla Edin buena atencin prenatal y seguir las indicaciones del mdico. La atencin prenatal incluye toda la asistencia mdica que usted recibe antes del nacimiento del beb. Esta ayudar a prevenir, [name] y tratar cualquier problema durante el embarazo y [place]. CAMBIOS EN EL ORGANISMO Su organismo atraviesa por muchos cambios durante el [place], y estos varan de Ardelia Mems mujer a Theatre manager.   Al principio, puede aumentar o bajar algunos kilos.  Puede tener Higher education careers adviser (nuseas) y vomitar. Si no puede controlar los vmitos, llame al mdico.  Puede cansarse con facilidad.  Es posible que tenga dolores de cabeza que pueden aliviarse con los medicamentos que el mdico le permita tomar.  Puede orinar con mayor frecuencia. El dolor al orinar puede significar que usted tiene una infeccin de la vejiga.  Debido al Glennis Brink, puede tener acidez estomacal.  Puede estar estreida, ya que ciertas hormonas enlentecen los movimientos de los msculos que J[employer] Co desechos a travs de los intestinos.  Pueden aparecer hemorroides o abultarse e hincharse las venas (venas varicosas).  Las [employer] National Corporation pueden empezar a Engineer, site y Scientist, forensic. Los pezones  pueden sobresalir ms, y el tejido que los rodea (areola) tornarse ms oscuro.  Las Production manager y estar sensibles al cepillado y al hilo dental.  Pueden aparecer zonas oscuras o manchas (cloasma, mscara del Media planner) en el rostro que probablemente se atenuarn despus del nacimiento del beb.  Los perodos menstruales se interrumpirn.  Tal vez no tenga apetito.  Puede sentir un fuerte deseo de consumir ciertos alimentos.  Puede tener cambios a Engineer, site a da, por ejemplo, por momentos puede estar emocionada por el Media planner y por otros preocuparse porque algo pueda salir mal con el embarazo o el beb.  Tendr sueos ms vvidos y extraos.  Tal vez haya cambios en el cabello que pueden incluir su engrosamiento, crecimiento rpido y cambios en la textura. A algunas mujeres tambin se les cae el cabello durante o despus del [place], o tienen el cabello seco o fino. Lo ms probable es que el cabello se le normalice despus del nacimiento del beb. QU DEBE ESPERAR EN LAS CONSULTAS PRENATALES Durante una visita prenatal de rutina:  La pesarn para asegurarse de que usted y el beb estn creciendo normalmente.  Le controlarn la presin arterial.  Le medirn el abdomen para controlar el desarrollo del beb.  Se escucharn los latidos cardacos a partir de la semana10 o la12 de embarazo, aproximadamente.  Se analizarn los resultados de los estudios solicitados en visitas anteriores. El mdico puede preguntarle:  Cmo se siente.  Si siente los movimientos del beb.  Si ha tenido [employer], como prdida de lquido, [place], dolores de cabeza intensos o  clicos abdominales.  Si tiene Sunoco. Otros estudios que pueden realizarse durante el primer trimestre incluyen lo siguiente:  Anlisis de sangre para determinar el tipo de sangre y [name] la presencia de infecciones previas. Adems, se los usar para controlar si los niveles de hierro  son bajos (anemia) y Teacher, adult education los anticuerpos Rh. En una etapa ms avanzada del [place], se harn anlisis de sangre para saber si tiene diabetes, junto con otros estudios si surgen problemas.  Anlisis de orina para detectar infecciones, diabetes o protenas en la orina.  Una ecografa para confirmar que el beb crece y se desarrolla correctamente.  Una amniocentesis para diagnosticar posibles problemas genticos.  Estudios del feto para descartar espina bfida y sndrome de [place].  Es posible que necesite otras pruebas adicionales. INSTRUCCIONES PARA EL CUIDADO EN EL HOGAR  Medicamentos:  Siga las indicaciones del mdico en relacin con el uso de medicamentos. Durante el embarazo, hay medicamentos que pueden tomarse y 60 que no.  Tome las vitaminas prenatales como se le indic.  Si est estreida, tome un laxante suave, si el mdico lo [place]. Dieta  Consuma alimentos balanceados. Elija alimentos variados, como carne o protenas de origen vegetal, pescado, leche y productos lcteos descremados, verduras, frutas y panes y Psychologist, prison and probation services. El mdico la ayudar a Office manager cantidad de peso que puede [place].  No coma carne cruda ni quesos sin cocinar. Estos elementos contienen bacterias que pueden causar defectos congnitos en el beb.  La ingesta diaria de cuatro o cinco comidas pequeas en lugar de tres comidas abundantes puede ayudar a K[employer] Energy nuseas y los vmitos. Si empieza a tener nuseas, comer algunas galletas saladas puede ser de Gem. Beber lquidos Lehman Brothers comidas en lugar de tomarlos durante las comidas tambin puede ayudar a Actor las nuseas y los vmitos.  Si est estreida, consuma alimentos con alto contenido de fibra, como verduras y frutas frescas, y Psychologist, prison and probation services. Beba suficiente lquido para Consulting civil engineer orina clara o de color amarillo plido. Actividad y Conservation officer, historic buildings ejercicio solamente como se lo haya indicado el mdico. El  ejercicio la ayudar a:  Technical sales engineer.  Mantenerse en forma.  Estar preparada para el trabajo de parto y [place].  Los dolores, los clicos en la parte baja del abdomen o los calambres en la cintura son un buen indicio de que debe dejar de Insurance risk surveyor. Consulte al mdico antes de seguir haciendo ejercicios normales.  Intente no estar de pie Tech Data Corporation. Mueva las piernas con frecuencia si debe estar de pie en un lugar durante mucho tiempo.  Evite levantar pesos EMCOR.  Use zapatos de tacones bajos y [place].  Puede seguir teniendo Office Depot, excepto que el mdico le indique lo contrario. Alivio del dolor o las molestias  Use un sostn que le brinde buen soporte si siente dolor a la palpacin Sempra Energy.  Dese baos de asiento con agua tibia para Best boy o las molestias causadas por las hemorroides. Use crema antihemorroidal si el mdico se lo permite.  Descanse con las piernas elevadas si tiene calambres o dolor de cintura.  Si tiene venas varicosas en las piernas, use medias de descanso. Eleve los pies durante 58minutos, 3 o 4veces por da. Limite la cantidad de sal en su dieta. Cuidados prenatales  Programe las visitas prenatales para la semana12 de [place]. Generalmente se programan cada mes al principio y se hacen ms frecuentes en los 2 ltimos meses antes  del parto.  Escriba sus preguntas. Llvelas cuando concurra a las visitas prenatales.  Concurra a todas las visitas prenatales como se lo haya indicado el mdico. Seguridad  Colquese el cinturn de seguridad cuando conduzca.  Haga una lista de los nmeros de telfono de Freight forwarder, que BJ's nmeros de telfono de familiares, [place], el hospital y los departamentos de polica y bomberos. Consejos generales  Pdale al mdico que la derive a clases de educacin prenatal en su localidad. Debe comenzar a tomar las clases antes de Dietitian en el mes6  de embarazo.  Pida ayuda si tiene necesidades nutricionales o de asesoramiento Solicitor. El mdico puede aconsejarla o derivarla a especialistas para que la ayuden con diferentes necesidades.  No se d baos de inmersin en agua caliente, baos turcos ni saunas.  No se haga duchas vaginales ni use tampones o toallas higinicas perfumadas.  No mantenga las piernas cruzadas durante mucho tiempo.  Evite el contacto con las bandejas sanitarias de los gatos y la tierra que estos animales usan. Estos elementos contienen bacterias que pueden causar defectos congnitos al beb y la posible prdida del feto debido a un aborto espontneo o muerte fetal.  No fume, no consuma hierbas ni medicamentos que no hayan sido recetados por el mdico. Las sustancias qumicas que estos productos contienen afectan la formacin y el desarrollo del beb.  Programe una cita con el dentista. En su casa, lvese los dientes con un cepillo dental blando y psese el hilo dental con suavidad. SOLICITE ATENCIN MDICA SI:   Tiene mareos.  Siente clicos leves, presin en la pelvis o dolor persistente en el abdomen.  Tiene nuseas, vmitos o diarrea persistentes.  Tiene secrecin vaginal con mal olor.  Siente dolor al Continental Airlines.  Tiene el rostro, las [place], las piernas o los tobillos ms hinchados. SOLICITE ATENCIN MDICA DE INMEDIATO SI:   Tiene fiebre.  Tiene una prdida de lquido por la vagina.  Tiene sangrado o pequeas prdidas vaginales.  Siente dolor intenso o clicos en el abdomen.  Sube o baja de peso rpidamente.  Vomita sangre de color rojo brillante o material que parezca granos de caf.  Ha estado expuesta a la rubola y no ha sufrido la enfermedad.  Ha estado expuesta a la quinta enfermedad o a la varicela.  Tiene un dolor de cabeza intenso.  Le falta el aire.  Sufre cualquier tipo de traumatismo, por ejemplo, debido a una cada o un accidente automovilstico. Document  Released: 01/12/2005 Document Revised: 08/19/2013 [facility] Patient Information 2015 [place], [place]. This information is not intended to replace advice given to you by your health care provider. Make sure you discuss any questions you have with your health care provider.  Eleccin del mtodo anticonceptivo (Contraception Choices) La anticoncepcin (control de la natalidad) es el uso de cualquier mtodo o dispositivo para Therapist, occupational. A continuacin se indican algunos de esos mtodos. [place] contraconceptivo consiste en un tubo plstico delgado que contiene la hormona progesterona. No contiene estrgenos. El mdico inserta el tubo en la parte interna del brazo. El tubo puede Nutritional therapist durante 3 aos. Despus de los 3 aos debe retirarse. El implante impide que los ovarios liberen vulos (ovulacin), espesa el moco cervical, lo que evita que los espermatozoides ingresen al tero y hace ms delgada la membrana que cubre el interior del tero.  Inyecciones de progesterona sola: las Water engineer cada 3 meses para Therapist, occupational. La progesterona sinttica  impide que los ovarios liberen vulos. Tambin hacen que el moco cervical se espese y modifique el tejido de recubrimiento interno del tero. Esto hace ms difcil que los espermatozoides sobrevivan en el tero.  Las pldoras anticonceptivas contienen estrgenos y Immunologist. Su funcin es Lear Corporation ovarios liberen vulos (ovulacin). Las hormonas de los anticonceptivos orales hacen que el moco cervical se haga ms espeso, lo que evita que el esperma ingrese al tero. Las pldoras anticonceptivas son recetadas por el mdico.Tambin se utilizan para tratar los perodos menstruales abundantes.  Minipldora: este tipo de pldora anticonceptiva contiene slo hormona progesterona. Deben tomarse todos los das del mes y debe recetarlas el mdico.  El parche de control de natalidad: contiene  hormonas similares a las que contienen las pldoras anticonceptivas. Deben cambiarse una vez por semana y se utilizan bajo prescripcin mdica.  Anillo vaginal: contiene hormonas similares a las que contienen las pldoras anticonceptivas. Se deja colocado durante tres semanas, se lo retira durante 1 semana y luego se coloca uno nuevo. La paciente debe sentirse cmoda al insertar y retirar el anillo de la vagina.Es necesaria la prescripcin mdica.  Anticonceptivos de emergencia: son mtodos para evitar un embarazo despus de Ardelia Mems relacin sexual sin proteccin. Esta pldora puede tomarse inmediatamente despus de Office manager sexuales o hasta 5 [place] de haber tenido sexo sin proteccin. Es ms efectiva si se toma poco tiempo despus de la relacin sexual. Los anticonceptivos de emergencia estn disponibles sin prescripcin mdica. Consltelo con su farmacutico. No use los anticonceptivos de emergencia como nico mtodo anticonceptivo. MTODOS DE [place]   Condn masculino: es una vaina delgada (ltex o goma) que se coloca cubriendo al pene durante el acto sexual. Grier Rocher con espermicida para aumentar la efectividad.  Condn femenino. Es una funda delicada y blanda que se adapta holgadamente a la vagina antes de las Armed forces operational officer.  Diafragma: es una barrera de ltex redonda y suave que debe ser recomendado por un profesional. Se inserta en la vagina, junto con un gel espermicida. Debe insertarse antes de Clinical biochemist. Debe dejar el diafragma colocado en la vagina durante 6 a 8 horas despus de la relacin sexual.  Capuchn cervical: es una barrera de ltex o taza plstica redonda y [place] que cubre el cuello del tero y debe ser colocada por un mdico. Puede dejarlo colocado en la vagina hasta 48 horas despus [place].  Esponja: es una pieza blanda y circular de espuma de poliuretano. Contiene un espermicida. Se inserta en la vagina despus de mojarla y  antes de las Office Depot.  Espermicidas: son sustancias qumicas que matan o bloquean al esperma y no lo dejan ingresar al cuello del tero y al tero. Vienen en forma de cremas, geles, supositorios, espuma o comprimidos. No es necesario tener Furniture conservator/restorer. Se insertan en la vagina con un aplicador antes de Clinical biochemist. El proceso debe repetirse cada vez que tiene relaciones sexuales. ANTICONCEPTIVOS INTRAUTERINOS  Dispositivo intrauterino (DIU) es un dispositivo en forma de T que se coloca en el tero durante el perodo menstrual, para Therapist, occupational. Hay dos tipos:  DIU de cobre: este tipo de DIU est recubierto con un alambre de cobre y se inserta dentro del tero. El cobre hace que el tero y las trompas de Falopio produzcan un liquido que Saks Incorporated espermatozoides. Puede permanecer colocado durante 10 aos.  DIU con hormona: este tipo de DIU contiene la hormona progestina (progesterona sinttica). La hormona espesa el moco cervical  y evita que los espermatozoides ingresen al tero y tambin afina la membrana que cubre el tero para evitar la implantacin del vulo fertilizado. La hormona debilita o destruye los espermatozoides que ingresan al tero. Puede Nutritional therapist durante 3-5 aos, segn el tipo de DIU que se [place]. MTODOS ANTICONCEPTIVOS PERMANENTES  Ligadura de trompas en la mujer: se realiza sellando, atando u obstruyendo quirrgicamente las trompas de Falopio lo que impide que el vulo descienda hacia el tero.  Esterilizacin histeroscpica: Implica la colocacin de un pequeo espiral o la insercin en cada trompa de Falopio. El mdico utiliza una tcnica llamada histeroscopa para Teacher, music procedimiento. El dispositivo produce la formacin de tejido Pensions consultant. Esto da como resultado una obstruccin permanente de las trompas de Falopio, de modo que la esperma no pueda fertilizar el vulo. Demora alrededor de 3 meses despus del  procedimiento hasta que el conducto se obstruye. Tendr que usar otro mtodo anticonceptivo durante al menos 3 meses.  Esterilizacin masculina: se realiza ligando los conductos por los que pasan los espermatozoides (vasectoma).Esto impide que el esperma ingrese a la vagina durante el acto sexual. Luego del procedimiento, el hombre puede eyacular lquido (semen). MTODOS DE PLANIFICACIN NATURAL  Planificacin familiar natural: consiste en no Clinical biochemist o usar un mtodo de barrera (condn, [place], capuchn cervical) en los [employer] Insurance la mujer podra quedar [place].  Mtodo de calendario: consiste en el seguimiento de la duracin de cada ciclo menstrual y la identificacin de los perodos frtiles.  Mtodo de ovulacin: Dance movement psychotherapist las relaciones sexuales durante la ovulacin.  Mtodo sintotrmico: Energy manager sexuales en la poca en la que se est ovulando, utilizando un termmetro y tendiendo en cuenta los sntomas de la ovulacin.  Mtodo postovulacin: Museum/gallery conservator las relaciones sexuales para despus de haber ovulado. Independientemente del tipo o mtodo anticonceptivo que usted elija, es importante que use condones para protegerse contra las infecciones de transmisin sexual (ETS). Hable con su mdico con respecto a qu mtodo anticonceptivo es el ms apropiado para usted. Document Released: 04/04/2005 Document Revised: 12/05/2012 [facility] Patient Information 2015 [place]. This information is not intended to replace advice given to you by your health care provider. Make sure you discuss any questions you have with your health care provider.  Lactancia materna (Breastfeeding) Decidir Economist es una de las mejores elecciones que puede hacer por usted y su beb. El cambio hormonal durante el Media planner produce el desarrollo del tejido mamario y [place] la cantidad y el tamao de los conductos galactforos. Estas hormonas tambin  permiten que las protenas, los azcares y las grasas de la sangre produzcan la Northeast Utilities materna en las glndulas productoras de [place]. Las hormonas impiden que la leche materna sea liberada antes del nacimiento del beb, adems de impulsar el flujo de leche luego del nacimiento. Una vez que ha comenzado a Economist, Freight forwarder beb, as Therapist, occupational succin o Social research officer, government, pueden estimular la liberacin de [place] de las glndulas productoras de [place].  LOS BENEFICIOS DE AMAMANTAR Para el beb  La primera leche (calostro) ayuda a Garment/textile technologist funcionamiento del sistema digestivo del beb.  La leche tiene anticuerpos que ayudan a Chemical engineer las infecciones en el beb.  El beb tiene una menor incidencia de asma, alergias y del sndrome de muerte sbita del lactante.  Los nutrientes en la [place] materna son mejores para el beb que la [place] maternizada y estn preparados exclusivamente para cubrir las necesidades del beb.  La leche materna mejora el desarrollo cerebral del beb.  Es menos probable que el beb desarrolle otras enfermedades, como obesidad infantil, asma o diabetes mellitus de tipo 2. Para usted   La lactancia materna favorece el desarrollo de un vnculo muy especial entre la madre y el beb.  Es conveniente. La leche materna siempre est disponible a la Tree surgeon y es [place].  La lactancia materna ayuda a quemar caloras y a perder el peso ganado durante el [place].  Favorece la contraccin del tero al tamao que tena antes del embarazo de manera ms rpida y disminuye el sangrado (loquios) despus del parto.  La lactancia materna contribuye a reducir Catering manager de desarrollar diabetes mellitus de tipo 2, osteoporosis o cncer de mama o de ovario en el futuro. SIGNOS DE QUE EL BEB EST HAMBRIENTO Primeros signos de hambre  Aumenta su estado de [place].  Se estira.  Mueve la cabeza de un lado a otro.  Mueve la cabeza y abre la boca cuando se le toca la  mejilla o la comisura de la boca (reflejo de bsqueda).  [place] vocalizaciones, tales como sonidos de succin, se relame los labios, emite arrullos, suspiros, o chirridos.  Mueve la [employer] boca.  Se chupa con ganas los dedos o las manos. Signos tardos de Hartford Financial.  Llora de manera intermitente. Signos de BJ's Wholesale signos de hambre extrema requerirn que lo calme y lo consuele antes de que el beb pueda alimentarse adecuadamente. No espere a que se manifiesten los siguientes signos de hambre extrema para comenzar a Economist:   Air cabin crew.  Llanto intenso y fuerte.   Gritos. INFORMACIN BSICA SOBRE LA LACTANCIA MATERNA Iniciacin de la lactancia materna  Encuentre un lugar cmodo para sentarse o acostarse, con un buen respaldo para el cuello y la espalda.  Coloque una almohada o una manta enrollada debajo del beb para acomodarlo a la altura de la mama (si est sentada). Las almohadas para Economist se han diseado especialmente a fin de servir de apoyo para los brazos y el beb Kellogg.  Asegrese de que el abdomen del beb est frente al suyo.  Masajee suavemente la mama. Con las yemas de los dedos, masajee la pared del pecho hacia el pezn en un movimiento circular. Esto estimula el flujo de [place]. Es posible que Oceanographer este movimiento mientras amamanta si la leche fluye lentamente.  Sostenga la mama con el pulgar por arriba del pezn y los otros 4 dedos por debajo de la mama. Asegrese de que los dedos se encuentren lejos del pezn y de la boca del beb.  Empuje suavemente los labios del beb con el pezn o con el dedo.  Cuando la boca del beb se abra lo suficiente, acrquelo rpidamente a la mama e introduzca todo el pezn y la zona oscura que lo rodea (areola), tanto como sea posible, dentro de la boca del beb.  Debe haber ms areola visible por arriba del labio superior del beb que por debajo del labio inferior.  La  lengua del beb debe estar entre la enca inferior y la [place].  Asegrese de que la boca del beb est en la posicin correcta alrededor del pezn (prendida). Los labios del beb deben crear un sello sobre la mama y estar doblados hacia afuera (invertidos).  Es comn que el beb succione durante 2 a 3 minutos para que comience el flujo de [place]. Cmo debe prenderse Es PepsiCo  que le ensee al beb cmo prenderse adecuadamente a la mama. Si el beb no se prende adecuadamente, puede causarle dolor en el pezn y reducir la produccin de [place], y hacer que el beb tenga un escaso aumento de [place]. Adems, si el beb no se prende adecuadamente al pezn, puede tragar aire durante la alimentacin. Esto puede causarle molestias al beb. Hacer eructar al beb al Eliezer Lofts de mama puede ayudarlo a liberar el aire. Sin embargo, ensearle al beb cmo prenderse a la mama adecuadamente es la mejor manera de evitar que se sienta molesto por tragar Administrator, sports se alimenta. Signos de que el beb se ha prendido adecuadamente al pezn:   Hall Busing o succiona de modo silencioso, sin causarle dolor.  Se escucha que traga cada 3 o 4 succiones.   Hay movimientos musculares por arriba y por delante de sus odos al Mining engineer. Signos de que el beb no se ha prendido Product manager al pezn:   Hace ruidos de succin o de chasquido mientras se alimenta.  Siente dolor en el pezn. Si cree que el beb no se prendi correctamente, deslice el dedo en la comisura de la boca y Micron Technology las encas del beb para interrumpir la succin. Intente comenzar a amamantar nuevamente. Signos de Economist Signos del beb:   Disminuye gradualmente el nmero de succiones o cesa la succin por completo.  Se duerme.  Relaja el cuerpo.  Retiene una pequea cantidad de [employer] Corporation boca.  Se desprende solo del pecho. Signos que presenta usted:  Las mamas han aumentado la firmeza, el peso y el  tamao 1 a 3 horas despus de Economist.  Estn ms blandas inmediatamente despus de amamantar.  Un aumento del volumen de [place], y tambin un cambio en su consistencia y color se producen hacia el [place] de Therapist, nutritional.  Los pezones no duelen, ni estn agrietados ni sangran. Signos de que su beb recibe la cantidad de leche suficiente  Moja al menos 3 paales en 24 horas. La orina debe ser clara y de color amarillo plido a los [place].  Defeca al menos 3 veces en 24 horas a los 5 das de vida. La materia fecal debe ser blanda y [place].  Defeca al menos 3 veces en 24 horas a los 7 das de vida. La materia fecal debe ser grumosa y [place].  No registra una prdida de peso mayor del 10% del peso al nacer durante los primeros [place].  Aumenta de peso un promedio de 4 a 7onzas (113 a 198g) por semana despus de los [place].  Aumenta de [place], [place], de [place] uniforme a Proofreader de los 5 das de vida, sin Museum/gallery curator prdida de peso despus de las 2semanas de vida. Despus de alimentarse, es posible que el beb regurgite una pequea cantidad. Esto es frecuente. FRECUENCIA Y DURACIN DE LA LACTANCIA MATERNA El amamantamiento frecuente la ayudar a producir ms [place] y a Warden/ranger de Social research officer, government en los pezones e hinchazn en las [place]. Alimente al beb cuando muestre signos de hambre o si siente la necesidad de reducir la congestin de las [place]. Esto se denomina "lactancia a demanda". Evite el uso del chupete mientras trabaja para establecer la lactancia (las primeras 4 a 6 semanas despus del nacimiento del beb). Despus de este perodo, podr ofrecerle un chupete. Las investigaciones demostraron que el uso del chupete durante el primer ao de vida del beb disminuye el riesgo  de desarrollar el sndrome de muerte sbita del lactante (SMSL). Permita que el nio se alimente en cada mama todo lo que desee. Contine amamantando al beb hasta que haya  terminado de alimentarse. Cuando el beb se desprende o se queda dormido mientras se est alimentando de la primera mama, ofrzcale la segunda. Debido a que, con frecuencia, los recin [place] [employer] las primeras semanas de vida, es posible que deba despertar al beb para alimentarlo. Los horarios de Writer de un beb a otro. Sin embargo, las siguientes reglas pueden servir como gua para ayudarla a Engineer, materials que el beb se alimenta adecuadamente:  Se puede amamantar a los recin nacidos (bebs de 4 semanas o menos de vida) cada 1 a 3 horas.  No deben transcurrir ms de 3 horas durante el da o 5 horas durante la noche sin que se amamante a los recin nacidos.  Debe amamantar al beb 8 veces como mnimo en un perodo de 24 horas, hasta que comience a introducir slidos en su dieta, a los 6 meses de vida aproximadamente. [place] extraccin y Recruitment consultant de la leche materna le permiten asegurarse de que el beb se alimente exclusivamente de [place], aun en momentos en los que no puede amamantar. Esto tiene especial importancia si debe regresar al [place] en el perodo en que an est amamantando o si no puede estar presente en los momentos en que el beb debe alimentarse. Su asesor en lactancia puede orientarla sobre cunto tiempo es [place].  El sacaleche es un aparato que le permite extraer leche de la mama a un recipiente estril. Luego, la leche materna extrada puede almacenarse en un refrigerador o Pension scheme manager. Algunos sacaleches son Theodore Demark, James Ivanoff otros son elctricos. Consulte a su asesor en lactancia qu tipo ser ms conveniente para usted. Los sacaleches se pueden comprar; sin embargo, algunos hospitales y grupos de apoyo a la lactancia materna alquilan Production assistant, radio. Un asesor en lactancia puede ensearle cmo extraer [employer], en caso de que prefiera no usar un sacaleche.    CMO CUIDAR LAS MAMAS DURANTE LA LACTANCIA MATERNA Los pezones se secan, agrietan y duelen durante la Therapist, nutritional. Las siguientes recomendaciones pueden ayudarla a Theatre manager las [employer] y sanas:  Art therapist usar jabn en los pezones.  Use un sostn de soporte. Aunque no son esenciales, las camisetas sin mangas o los sostenes especiales para Economist estn diseados para acceder fcilmente a las mamas, para Economist sin tener que quitarse todo el sostn o la camiseta. Evite usar sostenes con aro o sostenes muy ajustados.  Seque al aire sus pezones durante 3 a 57minutos despus de amamantar al beb.  Utilice solo apsitos de Chiropodist sostn para Tax adviser las prdidas de [place]. La prdida de un poco de [employer] tomas es normal.  Utilice lanolina sobre los pezones luego de Economist. La lanolina ayuda a mantener la humedad normal de la piel. Si [place] lanolina pura, no tiene que lavarse los pezones antes de volver a Research scientist (life sciences) al beb. La lanolina pura no es txica para el beb. Adems, puede extraer [employer] algunas gotas de [place] materna y Community education officer suavemente esa [employer], para que la Paden se seque al aire. Durante las primeras semanas despus de dar a luz, algunas mujeres pueden experimentar hinchazn en las mamas (congestin [place]). La congestin puede hacer que sienta las mamas pesadas, calientes y sensibles al tacto. El pico de la congestin ocurre  dentro de los 3 a 5 das despus del [place]. Las siguientes recomendaciones pueden ayudarla a Public house manager la congestin:  Vace por completo las mamas al [place]. Puede aplicar calor hmedo en las mamas (en la ducha o con toallas hmedas para manos) antes de Economist o extraer [employer]. Esto aumenta la circulacin y [place] a que la [place]. Si el beb no vaca por completo las mamas cuando lo amamanta, extraiga la [place] restante despus de que haya finalizado.  Use un sostn ajustado (para  amamantar o comn) o una camiseta sin mangas durante 1 o 2 das para indicar al cuerpo que disminuya ligeramente la produccin de [place].  Aplique compresas de hielo [employer], a menos que le resulte demasiado incmodo.  Asegrese de que el beb est prendido y se encuentre en la posicin correcta mientras lo alimenta. Si la congestin persiste luego de 48 horas o despus de seguir estas recomendaciones, comunquese con su mdico o un Lobbyist. RECOMENDACIONES GENERALES PARA EL CUIDADO DE LA SALUD DURANTE LA LACTANCIA MATERNA  Consuma alimentos saludables. [place] comidas y colaciones, y coma 3 de cada una por da. Dado que lo que come [employer], es posible que algunas comidas hagan que su beb se vuelva ms irritable de lo habitual. Evite comer este tipo de alimentos si percibe que afectan de manera negativa al beb.  Beba leche, jugos de fruta y agua para Engineer, water su sed (aproximadamente [place]).  Descanse con frecuencia, reljese y tome sus vitaminas prenatales para evitar la fatiga, el estrs y la anemia.  Contine con los autocontroles de la mama.  Evite masticar y fumar tabaco.  Evite el consumo de alcohol y drogas. Algunos medicamentos, que pueden ser perjudiciales para el beb, pueden pasar a travs de la [employer]. Es importante que consulte a su mdico antes de Medical sales representative, incluidos todos los medicamentos recetados y de [place], as como los suplementos vitamnicos y herbales. Puede quedar embarazada durante la lactancia. Si desea controlar la natalidad, consulte a su mdico cules son las opciones ms seguras para el beb. SOLICITE ATENCIN MDICA SI:   Usted siente que quiere dejar de Economist o se siente frustrada con la lactancia.  Siente dolor en las mamas o en los pezones.  Sus pezones estn agrietados o Control and instrumentation engineer.  Sus pechos estn irritados, sensibles o calientes.  Tiene un rea hinchada en cualquiera de  las mamas.  Siente escalofros o fiebre.  Tiene nuseas o vmitos.  Presenta una secrecin de otro lquido distinto de la leche materna de los pezones.  Sus mamas no se llenan antes de Economist al beb para el quinto da despus del [place].  Se siente triste y deprimida.  El beb est demasiado somnoliento como para comer bien.  El beb tiene problemas para dormir.  Moja menos de 3 paales en 24 horas.  Defeca menos de 3 veces en 24 horas.  La piel del beb o la parte blanca de los ojos se vuelven amarillentas.  El beb no ha aumentado de [place] a los [place]. SOLICITE ATENCIN MDICA DE INMEDIATO SI:   El beb est muy cansado Engineer, manufacturing) y no se quiere despertar para comer.  Le sube la fiebre sin causa. Document Released: 04/04/2005 Document Revised: 04/09/2013 [facility] Patient Information 2015 [place]. This information is not intended to replace advice given to you by your health care provider. Make sure you discuss any questions you have with  your health care provider.

## 2014-04-29 NOTE — Progress Notes (Signed)
   Subjective:    [name] is a [id number] [redacted]w[redacted]d being seen today for her first obstetrical visit.  Her obstetrical history is significant for advanced maternal age and obesity. Patient does intend to breast feed. Pregnancy history fully reviewed.  Patient reports some abdominal pain and back pain  Filed Vitals:   04/29/14 1455  BP: 115/69  Pulse: 85  Temp: 97.5 F (36.4 C)  Weight: 200 lb 12.8 oz (91.082 kg)    HISTORY: OB History  Gravida Para Term Preterm AB SAB TAB Ectopic Multiple Living  5 4 4       4     # Outcome Date GA Lbr Len/2nd Weight Sex Delivery Anes PTL Lv  5 Current           4 Term 03/18/08 [redacted]w[redacted]d    Vag-Spont  N Y  3 Term 11/07/00 [redacted]w[redacted]d    Vag-Spont  N Y  2 Term 07/09/90 [redacted]w[redacted]d    Vag-Spont  N Y  1 Term 08/24/89 [redacted]w[redacted]d    Vag-Spont        Past Medical History  Diagnosis Date  . 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 Asthma     when pregnant   No past surgical history on file. Family History  Problem Relation Age of Onset  . Diabetes Mother   . Hypertension Mother   . Hyperlipidemia Father   . Diabetes Father   . Hypertension Father   . Heart disease Sister   . Diabetes Brother   . Hyperlipidemia Brother   . Drug abuse Paternal Grandmother      Exam    Uterus:     Pelvic Exam:    Perineum: Normal Perineum   Vulva: normal   Vagina:  normal mucosa, normal discharge   pH:    Cervix: closed and long   Adnexa: normal adnexa and no mass, fullness, tenderness   Bony Pelvis: gynecoid  System: Breast:  normal appearance, no masses or tenderness   Skin: normal coloration and turgor, no rashes    Neurologic: oriented, no focal deficits   Extremities: normal strength, tone, and muscle mass   HEENT extra ocular movement intact   Mouth/Teeth mucous membranes moist, pharynx normal without lesions and dental hygiene good   Neck supple and no masses   Cardiovascular: regular rate and  rhythm   Respiratory:  chest clear, no wheezing, crepitations, rhonchi, normal symmetric air entry   Abdomen: soft, non-tender; bowel sounds normal; no masses,  no organomegaly   Urinary:       Assessment:    Pregnancy: [id number] Patient Active Problem List   Diagnosis Date Noted  . Right knee pain 11/11/2011  . Obesity 11/11/2011  . History of abnormal Pap smear 11/11/2011        Plan:     Initial labs drawn. Prenatal vitamins. Problem list reviewed and updated. Genetic Screening discussed: patient desires genetic counseling before deciding on which type of testing  Ultrasound discussed; fetal survey: requested.Will be ordered at a subsequent visit Patient a little upset at the number of providers in this practise. She was hoping to have one provider for her prenatal care and delivery  Follow up in 4 weeks. 50% of 30 min visit spent on counseling and coordination of care.     Kim [name] 04/29/2014

## 2014-04-30 LAB — PRENATAL PROFILE (SOLSTAS)
Antibody Screen: NEGATIVE
Basophils Absolute: 0 10*3/uL (ref 0.0–0.1)
Basophils Relative: 0 % (ref 0–1)
Eosinophils Absolute: 0.2 10*3/uL (ref 0.0–0.7)
Eosinophils Relative: 2 % (ref 0–5)
HCT: 37.1 % (ref 36.0–46.0)
HIV 1&2 Ab, 4th Generation: NONREACTIVE
Hemoglobin: 12.3 g/dL (ref 12.0–15.0)
Hepatitis B Surface Ag: NEGATIVE
Lymphocytes Relative: 26 % (ref 12–46)
Lymphs Abs: 2.3 10*3/uL (ref 0.7–4.0)
MCH: 29.5 pg (ref 26.0–34.0)
MCHC: 33.2 g/dL (ref 30.0–36.0)
MCV: 89 fL (ref 78.0–100.0)
MPV: 9.6 fL (ref 8.6–12.4)
Monocytes Absolute: 0.7 10*3/uL (ref 0.1–1.0)
Monocytes Relative: 8 % (ref 3–12)
Neutro Abs: 5.8 10*3/uL (ref 1.7–7.7)
Neutrophils Relative %: 64 % (ref 43–77)
Platelets: 252 10*3/uL (ref 150–400)
RBC: 4.17 MIL/uL (ref 3.87–5.11)
RDW: 14 % (ref 11.5–15.5)
Rh Type: POSITIVE
Rubella: 2.69 Index — ABNORMAL HIGH (ref <0.90)
WBC: 9 10*3/uL (ref 4.0–10.5)

## 2014-04-30 LAB — PRESCRIPTION MONITORING PROFILE (19 PANEL)
Amphetamine/Meth: NEGATIVE ng/mL
Barbiturate Screen, Urine: NEGATIVE ng/mL
Benzodiazepine Screen, Urine: NEGATIVE ng/mL
Buprenorphine, Urine: NEGATIVE ng/mL
Cannabinoid Scrn, Ur: NEGATIVE ng/mL
Carisoprodol, Urine: NEGATIVE ng/mL
Cocaine Metabolites: NEGATIVE ng/mL
Creatinine, Urine: 135.36 mg/dL (ref >20.0)
Fentanyl, Ur: NEGATIVE ng/mL
MDMA URINE: NEGATIVE ng/mL
Meperidine, Ur: NEGATIVE ng/mL
Methadone Screen, Urine: NEGATIVE ng/mL
Methaqualone: NEGATIVE ng/mL
Nitrites, Initial: NEGATIVE ug/mL
Opiate Screen, Urine: NEGATIVE ng/mL
Oxycodone Screen, Ur: NEGATIVE ng/mL
Phencyclidine, Ur: NEGATIVE ng/mL
Propoxyphene: NEGATIVE ng/mL
Tapentadol, urine: NEGATIVE ng/mL
Tramadol Scrn, Ur: NEGATIVE ng/mL
Zolpidem, Urine: NEGATIVE ng/mL
pH, Initial: 5.7 pH (ref 4.5–8.9)

## 2014-04-30 LAB — GC/CHLAMYDIA PROBE AMP
CT Probe RNA: NEGATIVE
GC Probe RNA: NEGATIVE

## 2014-04-30 LAB — GLUCOSE TOLERANCE, 1 HOUR (50G) W/O FASTING: Glucose, 1 Hour GTT: 139 mg/dL (ref 70–140)

## 2014-05-01 ENCOUNTER — Telehealth: Payer: Self-pay

## 2014-05-01 LAB — CULTURE, OB URINE
Colony Count: NO GROWTH
Organism ID, Bacteria: NO GROWTH

## 2014-05-01 NOTE — Telephone Encounter (Signed)
-----   Message from Mora Bellman, MD sent at 05/01/2014  1:33 PM EST ----- Please inform patient of abnormal 1 hour GCT and need to have 3 hour test prior to next visit  Thanks  Peggy

## 2014-05-02 ENCOUNTER — Ambulatory Visit (HOSPITAL_COMMUNITY): Payer: 59 | Attending: Obstetrics and Gynecology

## 2014-05-02 LAB — HEMOGLOBINOPATHY EVALUATION
Hemoglobin Other: 0 %
Hgb A2 Quant: 2.5 % (ref 2.2–3.2)
Hgb A: 97.5 % (ref 96.8–97.8)
Hgb F Quant: 0 % (ref 0.0–2.0)
Hgb S Quant: 0 %

## 2014-05-20 ENCOUNTER — Ambulatory Visit (INDEPENDENT_AMBULATORY_CARE_PROVIDER_SITE_OTHER): Payer: 59 | Admitting: Family Medicine

## 2014-05-20 VITALS — BP 133/75 | HR 86 | Temp 97.6°F | Wt 199.9 lb

## 2014-05-20 DIAGNOSIS — Z3492 Encounter for supervision of normal pregnancy, unspecified, second trimester: Secondary | ICD-10-CM

## 2014-05-20 DIAGNOSIS — Z23 Encounter for immunization: Secondary | ICD-10-CM

## 2014-05-20 LAB — POCT URINALYSIS DIP (DEVICE)
Bilirubin Urine: NEGATIVE
Glucose, UA: 250 mg/dL — AB
Ketones, ur: 15 mg/dL — AB
Nitrite: NEGATIVE
Protein, ur: NEGATIVE mg/dL
Specific Gravity, Urine: 1.025 (ref 1.005–1.030)
Urobilinogen, UA: 0.2 mg/dL (ref 0.0–1.0)
pH: 6 (ref 5.0–8.0)

## 2014-05-20 MED ORDER — DOXYLAMINE SUCCINATE (SLEEP) 25 MG PO TABS: 25.0000 mg | ORAL_TABLET | Freq: Every evening | ORAL | Status: DC | PRN

## 2014-05-20 NOTE — Progress Notes (Signed)
Pt states she does not fill well and cannnot define a particular issue. Pt complains of insomnia. Lyla Son present as Administrator, sports for encounter.

## 2014-05-20 NOTE — Progress Notes (Signed)
Having insomnia every night.  Also having some abdominal discomfort, which she attribute to difficulty sleeping.  Unisom prescribed.

## 2014-05-28 ENCOUNTER — Ambulatory Visit (HOSPITAL_COMMUNITY): Payer: 59

## 2014-05-30 ENCOUNTER — Ambulatory Visit (HOSPITAL_COMMUNITY)
Admission: RE | Admit: 2014-05-30 | Discharge: 2014-05-30 | Disposition: A | Discharge status: Home / Self Care | Payer: 59 | Source: Ambulatory Visit | Attending: Obstetrics and Gynecology | Admitting: Obstetrics and Gynecology

## 2014-05-30 ENCOUNTER — Encounter (HOSPITAL_COMMUNITY): Payer: Self-pay

## 2014-05-30 ENCOUNTER — Other Ambulatory Visit: Payer: Self-pay | Admitting: Obstetrics and Gynecology

## 2014-05-30 DIAGNOSIS — Z3492 Encounter for supervision of normal pregnancy, unspecified, second trimester: Secondary | ICD-10-CM

## 2014-05-30 DIAGNOSIS — O09522 Supervision of elderly multigravida, second trimester: Secondary | ICD-10-CM

## 2014-05-30 DIAGNOSIS — O99211 Obesity complicating pregnancy, first trimester: Secondary | ICD-10-CM

## 2014-05-30 DIAGNOSIS — Z3A16 16 weeks gestation of pregnancy: Secondary | ICD-10-CM

## 2014-06-02 ENCOUNTER — Encounter (HOSPITAL_COMMUNITY): Payer: Self-pay

## 2014-06-02 DIAGNOSIS — Z3A16 16 weeks gestation of pregnancy: Secondary | ICD-10-CM | POA: Insufficient documentation

## 2014-06-02 NOTE — Progress Notes (Signed)
Genetic Counseling  High-Risk Gestation Note  Appointment Date:  05/30/2013 Referred By: Mora Bellman, MD Date of Birth:  25-Apr-1970 Partner: Drue Flirt   Pregnancy History: [id number] Estimated Date of Delivery: 11/11/14 Estimated Gestational Age: [redacted]w[redacted]d Attending: Renella Cunas, MD   Ms. Horatio and her husband, Mr. Drue Flirt, were seen for genetic counseling because of a maternal age of 44 y.o..  [facility] Genetic Counseling Intern, Chelsey Burden, assisted with genetic counseling under my direct supervision. [facility] Spanish/English medical interpreter, Gypsy Balsam, provided interpretation for today's visit.   They were counseled regarding maternal age and the association with risk for chromosome conditions due to nondisjunction with aging of the ova.   We reviewed chromosomes, nondisjunction, and the associated 1 in 93 risk for fetal aneuploidy related to a maternal age of 44 y.o. at [redacted]w[redacted]d gestation.  They were counseled that the risk for aneuploidy decreases as gestational age increases, accounting for those pregnancies which spontaneously abort.  We specifically discussed Down syndrome (trisomy 107), trisomies 9 and 59, and sex chromosome aneuploidies (47,XXX and 47,XXY) including the common features and prognoses of each.   We reviewed available screening options including Quad screen, noninvasive prenatal screening (NIPS)/cell free DNA (cfDNA) testing, and detailed ultrasound.  They were counseled that screening tests are used to modify a patient's a priori risk for aneuploidy, typically based on age. This estimate provides a pregnancy specific risk assessment. We reviewed the benefits and limitations of each option. Specifically, we discussed the conditions for which each test screens, the detection rates, and false positive rates of each. They were also counseled regarding diagnostic testing via amniocentesis. We reviewed the approximate 1 in 443-154 risk for complications for  amniocentesis, including spontaneous pregnancy loss. After consideration of all the options, they elected to return for detailed ultrasound on 06/19/14. Ms. Aquilla Solian Moody declined NIPS, Quad screening, and amniocentesis.  She stated that she was initially considering NIPS, but after further consideration she would not want information regarding the presence of an underlying chromosome condition during a pregnancy. They understand that screening tests cannot rule out all birth defects or genetic syndromes. The patient was advised of this limitation and states she still does not want additional testing at this time.   Both family histories were reviewed and found to be contributory for a paternal half-nephew to Ms. [facility] with kidney cysts. He reportedly have surgical removal of one kidney given that it was no longer functioning. His other kidney reportedly has cysts but functions fine. He is currently 44 years old. No additional relatives were reported with kidney issues, including his three siblings. 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 Given that he is otherwise reportedly healthy, the description is more likely to fit with isolated occurrence. However, there are also specific genetic conditions than can have cystic kidneys as a feature. Given the reportedly family history and degree of relation, recurrence risk for the current pregnancy is likely low. However, additional information is needed regarding the etiology for the kidney cysts in order to accurately assess recurrence risk for relatives. We discussed that targeted ultrasound is available to assess fetal kidneys. However, ultrasound cannot diagnose or rule out all birth defects or genetic conditions.   The patient also reported a maternal half-niece who was born without the opening in her anus and required  surgical correction. She is currently 44 years old and  reportedly has had no medical issues since the initial surgical correction. An underlying cause is not known. 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 We reviewed that congenital anomalies can be isolated or part of an underlying condition or syndrome. Given the description, her condition was most likely isolated. Given the reportedly family history and degree of relation, recurrence risk for the current pregnancy is likely low. However, additional information is needed to accurately assess recurrence risk for relatives.  Without further information regarding the provided family history, an accurate genetic risk cannot be calculated. Further genetic counseling is warranted if more information is obtained.  Ms. [facility] [name] denied exposure to environmental toxins or chemical agents. She denied the use of tobacco or street drugs. She reported drinking alcohol (multiple drinks) on two separate occasions, on November 13 and 14, before knowing about the pregnancy. The all-or-none period was discussed, meaning exposures that occur in the first 4 weeks of gestation are typically thought to either not affect the pregnancy at all or result in a miscarriage.  Prenatal alcohol exposure can increase the risk for growth delays, small head size, heart defects, eye and facial differences, as well as behavior problems and learning disabilities. The risk of these to occur tends to increase with the amount of alcohol consumed. However, because there is no identified safe amount of alcohol in pregnancy, it is recommended to completely avoid alcohol in pregnancy. Given the reported timing of exposure, the risk for associated effects is not expected to be increased in the current pregnancy. She denied significant viral illnesses during the course of her pregnancy. Her medical and surgical histories were noncontributory.   I counseled  this couple regarding the above risks and available options.  The approximate face-to-face time with the genetic counselor was 55 minutes.  Chipper Oman, MS,  Certified Genetic Counselor 06/02/2014

## 2014-06-17 ENCOUNTER — Ambulatory Visit (INDEPENDENT_AMBULATORY_CARE_PROVIDER_SITE_OTHER): Payer: 59 | Admitting: Obstetrics & Gynecology

## 2014-06-17 VITALS — Wt 204.9 lb

## 2014-06-17 DIAGNOSIS — Z3492 Encounter for supervision of normal pregnancy, unspecified, second trimester: Secondary | ICD-10-CM

## 2014-06-17 LAB — POCT URINALYSIS DIP (DEVICE)
Bilirubin Urine: NEGATIVE
Glucose, UA: NEGATIVE mg/dL
Hgb urine dipstick: NEGATIVE
Ketones, ur: NEGATIVE mg/dL
Leukocytes, UA: NEGATIVE
Nitrite: NEGATIVE
Protein, ur: NEGATIVE mg/dL
Specific Gravity, Urine: 1.025 (ref 1.005–1.030)
Urobilinogen, UA: 0.2 mg/dL (ref 0.0–1.0)
pH: 6 (ref 5.0–8.0)

## 2014-06-17 MED ORDER — FAMOTIDINE 40 MG PO TABS: 40.0000 mg | ORAL_TABLET | Freq: Every evening | ORAL | Status: DC

## 2014-06-17 NOTE — Progress Notes (Signed)
[place] in 3 days. Reflux sx noted, Rx Pepcid 40 mg  1 hr GTT 04/2014 was 139, needs 3 hr GTT

## 2014-06-17 NOTE — Patient Instructions (Signed)
Segundo trimestre de Media planner (Second Trimester of Pregnancy) El segundo trimestre va desde la semana13 hasta la 68, desde el cuarto hasta el sexto mes, y suele ser el momento en el que mejor se siente. Su organismo se ha adaptado a Public relations account executive y comienza a Print production planner. En general, las nuseas matutinas han disminuido o han desaparecido completamente, p El segundo trimestre es tambin la poca en la que el feto se desarrolla rpidamente. Hacia el final del sexto mes, el feto mide aproximadamente 9pulgadas (23cm) y pesa alrededor de 1 libras (700g). Es probable que sienta que el beb se Software engineer (da pataditas) entre las 68 y 20semanas del Media planner. CAMBIOS EN EL ORGANISMO Su organismo atraviesa por muchos cambios durante el Lane, y estos varan de Ardelia Mems mujer a Theatre manager.   Seguir [employer]. Notar que la parte baja del abdomen sobresale.  Podrn aparecer las primeras Apache Corporation caderas, el abdomen y las Aneta.  Es posible que tenga dolores de cabeza que pueden aliviarse con los medicamentos que su mdico autorice.  Tal vez tenga necesidad de orinar con ms frecuencia porque el feto est ejerciendo presin Field seismologist.  Debido al Glennis Brink podr sentir Victorio Palm estomacal con frecuencia.  Puede estar estreida, ya que ciertas hormonas enlentecen los movimientos de los msculos que JPMorgan Chase & Co desechos a travs de los intestinos.  Pueden aparecer hemorroides o abultarse e hincharse las venas (venas varicosas).  Puede tener dolor de espalda que se debe al Southern Company de peso y a que las hormonas del Scientist, research (life sciences) las articulaciones entre los huesos de la pelvis, y Civil Service fast streamer consecuencia de la modificacin del peso y los msculos que mantienen el equilibrio.  Las Lincoln National Corporation seguirn creciendo y Teaching laboratory technician.  Las Production manager y estar sensibles al cepillado y al hilo dental.  Pueden aparecer zonas oscuras o manchas (cloasma, mscara del Media planner) en el rostro que  probablemente se atenuarn despus del nacimiento del beb.  Es posible que se forme una lnea oscura desde el ombligo hasta la zona del pubis (linea nigra) que probablemente se atenuarn despus del nacimiento del beb.  Tal vez haya cambios en el cabello que pueden incluir su engrosamiento, crecimiento rpido y cambios en la textura. Adems, a algunas mujeres se les cae el cabello durante o despus del embarazo, o tienen el cabello seco o fino. Lo ms probable es que el cabello se le normalice despus del nacimiento del beb. QU DEBE ESPERAR EN LAS CONSULTAS PRENATALES Durante una visita prenatal de rutina:  La pesarn para asegurarse de que usted y el feto estn creciendo normalmente.  Le tomarn la presin arterial.  Le medirn el abdomen para controlar el desarrollo del beb.  Se escucharn los latidos cardacos fetales.  Se evaluarn los resultados de los estudios solicitados en visitas anteriores. El mdico puede preguntarle lo siguiente:  Cmo se siente.  Si siente los movimientos del beb.  Si ha tenido sntomas anormales, como prdida de lquido, [place], dolores de cabeza intensos o clicos abdominales.  Si tiene Sunoco. Otros estudios que podrn realizarse durante el segundo trimestre incluyen lo siguiente:  Anlisis de sangre para detectar:  Concentraciones de hierro bajas (anemia).  Diabetes gestacional (entre la semana 24 y la 39).  Anticuerpos Rh.  Anlisis de orina para detectar infecciones, diabetes o protenas en la orina.  Una ecografa para confirmar que el beb crece y se desarrolla correctamente.  Una amniocentesis para diagnosticar posibles problemas genticos.  Estudios del feto para descartar espina  bfida y sndrome de [place]. INSTRUCCIONES PARA EL CUIDADO EN EL HOGAR   Evite fumar, consumir hierbas, beber alcohol y tomar frmacos que no le hayan recetado. Estas sustancias qumicas afectan la formacin y el desarrollo del beb.  [place] mdico en relacin con el uso de medicamentos. Durante el embarazo, hay medicamentos que son seguros de tomar y otros que no.  Haga actividad fsica solo en la forma indicada por el mdico. Sentir clicos uterinos es un buen signo para Ambulance person actividad fsica.  Contine comiendo alimentos que sanos con regularidad.  Use un sostn que le brinde buen soporte si le Nordstrom.  No se d baos de inmersin en agua caliente, baos turcos ni saunas.  Colquese el cinturn de seguridad cuando conduzca.  No coma carne cruda ni queso sin cocinar; evite el contacto con las bandejas sanitarias de los gatos y la tierra que estos animales usan. Estos elementos contienen grmenes que pueden causar defectos congnitos en el beb.  [place].  Si est estreida, pruebe un laxante suave (si el mdico lo autoriza). Consuma ms alimentos ricos en fibra, como vegetales y frutas frescos y Psychologist, prison and probation services. Beba gran cantidad de lquido para mantener la orina de tono claro o color amarillo plido.  Dese baos de asiento con agua tibia para Best boy o las molestias causadas por las hemorroides. Use una crema para las hemorroides si el mdico la autoriza.  Si tiene venas varicosas, use medias de descanso. Eleve los pies durante 62minutos, 3 o 4veces por da. Limite la cantidad de sal en su dieta.  No levante objetos pesados, use zapatos de tacones bajos y mantenga una buena postura.  Descanse con las piernas elevadas si tiene calambres o dolor de cintura.  Visite a su dentista si an no lo ha Quarry manager. Use un cepillo de dientes blando para higienizarse los dientes y psese el hilo dental con suavidad.  Puede seguir American Electric Power, a menos que el mdico le indique lo contrario.  Concurra a todas las visitas prenatales segn las indicaciones de su mdico. SOLICITE ATENCIN MDICA SI:   Natale Milch.  Siente  clicos leves, presin en la pelvis o dolor persistente en el abdomen.  Tiene nuseas, vmitos o diarrea persistentes.  Tiene secrecin vaginal con mal olor.  Siente dolor al Continental Airlines. SOLICITE ATENCIN MDICA DE INMEDIATO SI:   Tiene fiebre.  Tiene una prdida de lquido por la vagina.  Tiene sangrado o pequeas prdidas vaginales.  Siente dolor intenso o clicos en el abdomen.  Sube o baja de peso rpidamente.  Tiene dificultad para respirar y siente dolor de pecho.  Sbitamente se le hinchan mucho el rostro, las [place], los tobillos, los pies o las piernas.  No ha sentido los movimientos del beb durante Leone Brand.  Siente un dolor de cabeza intenso que no se alivia con medicamentos.  Hay cambios en la visin. Document Released: 01/12/2005 Document Revised: 04/09/2013 [facility] Patient Information 2015 [place]. This information is not intended to replace advice given to you by your health care provider. Make sure you discuss any questions you have with your health care provider.

## 2014-06-17 NOTE — Progress Notes (Signed)
Spanish interpreter Marin Roberts Edema- lips, hands, feet  Pt reports stomach gets hard and she has pain with it

## 2014-06-19 ENCOUNTER — Encounter (HOSPITAL_COMMUNITY): Payer: Self-pay

## 2014-06-19 ENCOUNTER — Other Ambulatory Visit: Payer: 59

## 2014-06-19 ENCOUNTER — Ambulatory Visit (HOSPITAL_COMMUNITY)
Admission: RE | Admit: 2014-06-19 | Discharge: 2014-06-19 | Disposition: A | Discharge status: Home / Self Care | Payer: 59 | Source: Ambulatory Visit | Attending: Obstetrics and Gynecology | Admitting: Obstetrics and Gynecology

## 2014-06-19 ENCOUNTER — Other Ambulatory Visit: Payer: Self-pay | Admitting: Obstetrics & Gynecology

## 2014-06-19 DIAGNOSIS — Z36 Encounter for antenatal screening of mother: Secondary | ICD-10-CM | POA: Insufficient documentation

## 2014-06-19 DIAGNOSIS — O09522 Supervision of elderly multigravida, second trimester: Secondary | ICD-10-CM

## 2014-06-19 DIAGNOSIS — Z3A19 19 weeks gestation of pregnancy: Secondary | ICD-10-CM | POA: Diagnosis not present

## 2014-06-19 DIAGNOSIS — O09529 Supervision of elderly multigravida, unspecified trimester: Secondary | ICD-10-CM | POA: Insufficient documentation

## 2014-06-19 DIAGNOSIS — Z1211 Encounter for screening for malignant neoplasm of colon: Secondary | ICD-10-CM | POA: Insufficient documentation

## 2014-06-19 DIAGNOSIS — Z3689 Encounter for other specified antenatal screening: Secondary | ICD-10-CM | POA: Insufficient documentation

## 2014-06-19 NOTE — ED Notes (Signed)
Spanish Interpreter [place] with pt.

## 2014-06-20 LAB — GLUCOSE TOLERANCE, 3 HOURS
Glucose Tolerance, 1 hour: 187 mg/dL (ref 70–189)
Glucose Tolerance, 2 hour: 156 mg/dL (ref 70–164)
Glucose Tolerance, Fasting: 79 mg/dL (ref 70–104)
Glucose, GTT - 3 Hour: 163 mg/dL — ABNORMAL HIGH (ref 70–144)

## 2014-06-25 ENCOUNTER — Telehealth: Payer: Self-pay | Admitting: General Practice

## 2014-06-25 NOTE — Telephone Encounter (Signed)
Called patient with pacific interpreter [phone], no answer- left message stating we are trying to reach you with results, please call us back at the clinics

## 2014-06-25 NOTE — Telephone Encounter (Signed)
-----   Message from Woodroe Mode, MD sent at 06/25/2014  3:47 PM EST ----- Abnormal 3 hr needs DM counseling and teaching

## 2014-06-26 NOTE — Telephone Encounter (Signed)
Called patient with pacific interpreter [phone], no answer- left message stating we are trying to reach you with results, please call us back at the clinics

## 2014-06-27 NOTE — Telephone Encounter (Signed)
Called patient with Kim [name] for interpreter, no answer- left message to call us back at the clinics for results. Called patient's sister Kim [name] and asked for a better phone number for the patient as we are trying to reach her. Patient's sister provided number of [phone]. Called patient and informed her of 3 hr gtt results and need to meet with nancy on Monday for education. Patient verbalized understanding to all and states she can come Monday at 10am. Patient had no other questions

## 2014-06-30 ENCOUNTER — Ambulatory Visit: Payer: 59 | Admitting: *Deleted

## 2014-06-30 ENCOUNTER — Encounter: Payer: 59 | Attending: Obstetrics & Gynecology | Admitting: *Deleted

## 2014-06-30 VITALS — Ht 65.0 in | Wt 201.0 lb

## 2014-06-30 DIAGNOSIS — Z713 Dietary counseling and surveillance: Secondary | ICD-10-CM | POA: Insufficient documentation

## 2014-06-30 DIAGNOSIS — O24419 Gestational diabetes mellitus in pregnancy, unspecified control: Secondary | ICD-10-CM

## 2014-06-30 MED ORDER — ACCU-CHEK FASTCLIX LANCETS MISC: 1.0000 | Freq: Four times a day (QID) | Status: DC

## 2014-06-30 MED ORDER — GLUCOSE BLOOD VI STRP: ORAL_STRIP | Status: DC

## 2014-06-30 NOTE — Progress Notes (Signed)
Nutrition note: GDM diet education Pt is a newly diagnosed GDM pt & has h/o obesity. Pt has gained 11# @ [redacted]w[redacted]d, which is > expected. Pt reports eating 5x/d. Pt is taking a PNV. Pt reports walking some days. Pt reports nausea & heartburn occ (taking meds to help both). Pt received verbal & written education in Spanish via an interpreter about GDM diet. Discussed wt gain goals of 11-20# or 0.5#/wk. Pt agrees to follow GDM diet with 3 meals & 2-3 snacks/d with proper CHO/ protein combination. Pt does not have [place] but plans to apply. Pt plans to BF. F/u in 2-4 wks Vladimir Faster, MS, RD, LDN, [facility]

## 2014-06-30 NOTE — Progress Notes (Signed)
  Patient was seen on 06/30/14 for Gestational Diabetes self-management . The following learning objectives were met by the patient :   States the definition of Gestational Diabetes  States when to check blood glucose levels  Demonstrates proper blood glucose monitoring techniques  States the effect of stress and exercise on blood glucose levels  States the importance of limiting caffeine and abstaining from alcohol and smoking  Plan:  Consider  increasing your activity level by walking daily as tolerated Begin checking BG before breakfast and 2 hours after first bit of breakfast, lunch and dinner after  as directed by MD  Take medication  as directed by MD  Blood glucose monitor given: Accu Chek Nano BG Monitoring Kit Lot # [id number] Exp: 04/18/15 Blood glucose reading: 167 @ 1hpp  Patient instructed to monitor glucose levels: FBS: 60 - <90 2 hour: <120  Patient received the following handouts:  Nutrition Diabetes and Pregnancy  Carbohydrate Counting List  Meal Planning worksheet  Patient will be seen for follow-up as needed.

## 2014-07-07 ENCOUNTER — Ambulatory Visit: Payer: 59

## 2014-07-14 ENCOUNTER — Ambulatory Visit (INDEPENDENT_AMBULATORY_CARE_PROVIDER_SITE_OTHER): Payer: 59 | Admitting: Obstetrics and Gynecology

## 2014-07-14 VITALS — BP 107/63 | HR 98 | Temp 97.3°F | Wt 201.5 lb

## 2014-07-14 DIAGNOSIS — Z3492 Encounter for supervision of normal pregnancy, unspecified, second trimester: Secondary | ICD-10-CM

## 2014-07-14 DIAGNOSIS — O09522 Supervision of elderly multigravida, second trimester: Secondary | ICD-10-CM | POA: Diagnosis not present

## 2014-07-14 DIAGNOSIS — O24912 Unspecified diabetes mellitus in pregnancy, second trimester: Secondary | ICD-10-CM

## 2014-07-14 DIAGNOSIS — O24312 Unspecified pre-existing diabetes mellitus in pregnancy, second trimester: Secondary | ICD-10-CM

## 2014-07-14 DIAGNOSIS — O24319 Unspecified pre-existing diabetes mellitus in pregnancy, unspecified trimester: Secondary | ICD-10-CM | POA: Insufficient documentation

## 2014-07-14 LAB — POCT URINALYSIS DIP (DEVICE)
Bilirubin Urine: NEGATIVE
Glucose, UA: NEGATIVE mg/dL
Hgb urine dipstick: NEGATIVE
Ketones, ur: NEGATIVE mg/dL
Nitrite: NEGATIVE
Protein, ur: NEGATIVE mg/dL
Specific Gravity, Urine: 1.025 (ref 1.005–1.030)
Urobilinogen, UA: 0.2 mg/dL (ref 0.0–1.0)
pH: 6 (ref 5.0–8.0)

## 2014-07-14 MED ORDER — GLYBURIDE 2.5 MG PO TABS: 2.5000 mg | ORAL_TABLET | Freq: Two times a day (BID) | ORAL | Status: DC

## 2014-07-14 NOTE — Progress Notes (Signed)
Reports abdominal pain occasionally at night especially when changing position.  Reports edema in hands.  Sherolyn Buba used as interpreter for this encounter.

## 2014-07-14 NOTE — Progress Notes (Signed)
44 y.o. [id number] at [redacted]w[redacted]d here for routine prenatal visit.  1. GDM. Checking blood sugars four times daily and > 50% of fasting and postprandial above goal. Start glyburide 2.5 mg BID today. 2. Routine PNC. Lab work up to date. FM/PTL precautions reviewed. RTC 2 weeks to review blood sugar log.

## 2014-07-15 ENCOUNTER — Encounter: Payer: 59 | Admitting: Obstetrics & Gynecology

## 2014-07-16 ENCOUNTER — Telehealth: Payer: Self-pay | Admitting: *Deleted

## 2014-07-16 ENCOUNTER — Encounter: Payer: Self-pay | Admitting: Obstetrics & Gynecology

## 2014-07-16 MED ORDER — ASPIRIN EC 81 MG PO TBEC: 81.0000 mg | DELAYED_RELEASE_TABLET | Freq: Every day | ORAL | Status: DC

## 2014-07-16 NOTE — Telephone Encounter (Signed)
Also need to tell patient needs to take a baby aspirin daily to prevent preeclampsia later in pregnancy. Per Dr. Harolyn Rutherford if she is eating meals and blood sugars are too low , can take 1/2 dose until Monday- bring log with all blood sugars and meds taken to appointment.

## 2014-07-16 NOTE — Telephone Encounter (Signed)
Called [place] with interpreter Truitt Merle . Left message we are returning her call , please call clinic.

## 2014-07-16 NOTE — Telephone Encounter (Signed)
Called and left a message she was prescribed medicines for diabetes Monday and she started taking them and feels like they made her sick- make her feel shaky and sweaty.  States can leave a detailed message on her voicemail

## 2014-07-16 NOTE — Addendum Note (Signed)
Addended by: Verita Schneiders A on: 07/16/2014 12:39 PM   Modules accepted: Orders

## 2014-07-17 ENCOUNTER — Ambulatory Visit (HOSPITAL_COMMUNITY): Payer: 59

## 2014-07-17 NOTE — Telephone Encounter (Signed)
Contacted pt: Interpreter used [employer] instructed pt to take a baby aspirin daily 81 mg and to take 1/2 of perscribed dose of glyburide (1/2 tab in AM 1/2 tab PM). Instructed pt to bring all blood sugars in log and meds to next appt on 07/28/14. Pt states that she had only taken glyburide 2 times and had discontinued taking medication due to sweats and not feeling well. Pt verbalizes understanding and has no further questions.

## 2014-07-21 ENCOUNTER — Other Ambulatory Visit: Payer: Self-pay | Admitting: *Deleted

## 2014-07-21 DIAGNOSIS — Z3492 Encounter for supervision of normal pregnancy, unspecified, second trimester: Secondary | ICD-10-CM

## 2014-07-22 ENCOUNTER — Ambulatory Visit (HOSPITAL_COMMUNITY): Admission: RE | Admit: 2014-07-22 | Payer: 59 | Source: Ambulatory Visit

## 2014-07-24 ENCOUNTER — Other Ambulatory Visit: Payer: Self-pay | Admitting: Obstetrics & Gynecology

## 2014-07-24 DIAGNOSIS — O09522 Supervision of elderly multigravida, second trimester: Secondary | ICD-10-CM

## 2014-07-28 ENCOUNTER — Encounter: Payer: 59 | Admitting: Obstetrics and Gynecology

## 2014-08-12 ENCOUNTER — Ambulatory Visit (HOSPITAL_COMMUNITY)
Admission: RE | Admit: 2014-08-12 | Discharge: 2014-08-12 | Disposition: A | Discharge status: Home / Self Care | Payer: 59 | Source: Ambulatory Visit | Attending: Obstetrics | Admitting: Obstetrics

## 2014-08-12 DIAGNOSIS — O09522 Supervision of elderly multigravida, second trimester: Secondary | ICD-10-CM

## 2014-08-15 ENCOUNTER — Other Ambulatory Visit (HOSPITAL_COMMUNITY): Payer: Self-pay | Admitting: Maternal and Fetal Medicine

## 2014-08-15 DIAGNOSIS — O09522 Supervision of elderly multigravida, second trimester: Secondary | ICD-10-CM

## 2014-08-15 DIAGNOSIS — O24419 Gestational diabetes mellitus in pregnancy, unspecified control: Secondary | ICD-10-CM

## 2014-08-22 ENCOUNTER — Ambulatory Visit (HOSPITAL_COMMUNITY): Payer: 59

## 2014-08-27 ENCOUNTER — Inpatient Hospital Stay (HOSPITAL_COMMUNITY)
Admission: AD | Admit: 2014-08-27 | Discharge: 2014-08-28 | Disposition: A | Discharge status: Home / Self Care | Payer: 59 | Source: Ambulatory Visit | Attending: Obstetrics | Admitting: Obstetrics

## 2014-08-27 ENCOUNTER — Encounter (HOSPITAL_COMMUNITY): Payer: Self-pay | Admitting: *Deleted

## 2014-08-27 DIAGNOSIS — R109 Unspecified abdominal pain: Secondary | ICD-10-CM

## 2014-08-27 DIAGNOSIS — Z3A29 29 weeks gestation of pregnancy: Secondary | ICD-10-CM | POA: Insufficient documentation

## 2014-08-27 DIAGNOSIS — O99613 Diseases of the digestive system complicating pregnancy, third trimester: Secondary | ICD-10-CM | POA: Insufficient documentation

## 2014-08-27 DIAGNOSIS — O09523 Supervision of elderly multigravida, third trimester: Secondary | ICD-10-CM | POA: Diagnosis not present

## 2014-08-27 DIAGNOSIS — K219 Gastro-esophageal reflux disease without esophagitis: Secondary | ICD-10-CM

## 2014-08-27 DIAGNOSIS — O26899 Other specified pregnancy related conditions, unspecified trimester: Secondary | ICD-10-CM

## 2014-08-27 HISTORY — DX: Gestational diabetes mellitus in pregnancy, unspecified control: O24.419

## 2014-08-27 NOTE — MAU Note (Signed)
Pt states upper abd pain and nausea. States pain for the last 24 hours, pain worsened tonight.

## 2014-08-27 NOTE — MAU Note (Addendum)
Pt reports pain in her upper abdomen since this morning. Pt reports that the pain is always there, but it increases in intensity at times. Pt also states that when she has the pain her "belly gets hard". Pt took tylenol earlier and it helped "a little". Pt also reports a small amount of white discharge with some vaginal burning.

## 2014-08-28 DIAGNOSIS — K219 Gastro-esophageal reflux disease without esophagitis: Secondary | ICD-10-CM

## 2014-08-28 LAB — AMYLASE: Amylase: 50 U/L (ref 28–100)

## 2014-08-28 LAB — COMPREHENSIVE METABOLIC PANEL
ALT: 11 U/L — ABNORMAL LOW (ref 14–54)
AST: 16 U/L (ref 15–41)
Albumin: 2.7 g/dL — ABNORMAL LOW (ref 3.5–5.0)
Alkaline Phosphatase: 106 U/L (ref 38–126)
Anion gap: 7 (ref 5–15)
BUN: 7 mg/dL (ref 6–20)
CO2: 19 mmol/L — ABNORMAL LOW (ref 22–32)
Calcium: 7.8 mg/dL — ABNORMAL LOW (ref 8.9–10.3)
Chloride: 109 mmol/L (ref 101–111)
Creatinine, Ser: 0.32 mg/dL — ABNORMAL LOW (ref 0.44–1.00)
GFR calc Af Amer: >60 mL/min (ref >60)
GFR calc non Af Amer: >60 mL/min (ref >60)
Glucose, Bld: 95 mg/dL (ref 65–99)
Potassium: 3.3 mmol/L — ABNORMAL LOW (ref 3.5–5.1)
Sodium: 135 mmol/L (ref 135–145)
Total Bilirubin: 0.4 mg/dL (ref 0.3–1.2)
Total Protein: 6 g/dL — ABNORMAL LOW (ref 6.5–8.1)

## 2014-08-28 LAB — URINALYSIS, ROUTINE W REFLEX MICROSCOPIC
Bilirubin Urine: NEGATIVE
Glucose, UA: NEGATIVE mg/dL
Hgb urine dipstick: NEGATIVE
Ketones, ur: 15 mg/dL — AB
Leukocytes, UA: NEGATIVE
Nitrite: NEGATIVE
Protein, ur: NEGATIVE mg/dL
Specific Gravity, Urine: 1.025 (ref 1.005–1.030)
Urobilinogen, UA: 0.2 mg/dL (ref 0.0–1.0)
pH: 6 (ref 5.0–8.0)

## 2014-08-28 LAB — CBC
HCT: 31 % — ABNORMAL LOW (ref 36.0–46.0)
Hemoglobin: 10.7 g/dL — ABNORMAL LOW (ref 12.0–15.0)
MCH: 30.7 pg (ref 26.0–34.0)
MCHC: 34.5 g/dL (ref 30.0–36.0)
MCV: 88.8 fL (ref 78.0–100.0)
Platelets: 189 10*3/uL (ref 150–400)
RBC: 3.49 MIL/uL — ABNORMAL LOW (ref 3.87–5.11)
RDW: 14.4 % (ref 11.5–15.5)
WBC: 6.1 10*3/uL (ref 4.0–10.5)

## 2014-08-28 LAB — LIPASE, BLOOD: Lipase: 18 U/L — ABNORMAL LOW (ref 22–51)

## 2014-08-28 MED ORDER — RANITIDINE HCL 150 MG PO TABS: 150.0000 mg | ORAL_TABLET | Freq: Two times a day (BID) | ORAL | Status: DC

## 2014-08-28 MED ORDER — GI COCKTAIL ~~LOC~~
30.0000 mL | Freq: Once | ORAL | Status: AC
  Administered 2014-08-28: 30 mL via ORAL
  Filled 2014-08-28: qty 30

## 2014-08-28 NOTE — Discharge Instructions (Signed)
Dolor abdominal en el embarazo (Abdominal Pain During Pregnancy) El dolor abdominal es frecuente durante el embarazo. Generalmente no causa ningn dao. El dolor abdominal puede tener numerosas causas. Algunas causas son ms graves que otras. Ciertas causas de dolor abdominal durante el embarazo se diagnostican fcilmente. A veces, se tarda un tiempo para llegar al diagnstico. Otras veces la causa no se conoce. El dolor abdominal puede estar relacionado con [place] alteracin del [place], o puede deberse a una causa totalmente diferente. Por este motivo, siempre consulte a su mdico cuando sienta molestias abdominales. INSTRUCCIONES PARA EL CUIDADO EN EL HOGAR  Est atenta al dolor para ver si hay cambios. Las siguientes indicaciones ayudarn a Writer Ryder System pueda sentir:  No Consulting civil engineer sexuales y no coloque nada dentro de la vagina hasta que los sntomas hayan desaparecido completamente.  Descanse todo lo que pueda Guardian Life Insurance dolor se le haya calmado.  Si siente nuseas, beba lquidos claros. Evite los alimentos slidos mientras sienta malestar o tenga nuseas.  Tome slo medicamentos de venta libre o recetados, segn las indicaciones del mdico.  Cumpla con todas las visitas de control, segn le indique su mdico. SOLICITE ATENCIN MDICA DE INMEDIATO SI:  Tiene un sangrado, prdida de lquidos o elimina tejidos por la vagina.  El dolor o los clicos [place].  Tiene vmitos persistentes.  Comienza a Education officer, environmental al orinar u Gap [employer].  Tiene fiebre.  Nota que los movimientos del beb disminuyen.  Siente intensa debilidad o se marea.  Tiene dificultad para respirar con o sin dolor abdominal.  Siente un dolor de cabeza intenso junto al dolor abdominal.  Wilma Flavin secrecin vaginal anormal con dolor abdominal.  Tiene diarrea persistente.  El dolor abdominal sigue o empeora an despus de Magazine features editor. ASEGRESE DE QUE:   Comprende estas  instrucciones.  Controlar su afeccin.  Recibir ayuda de inmediato si no mejora o si empeora. Document Released: 04/04/2005 Document Revised: 01/23/2013 [facility] Patient Information 2015 [place], [place]. This information is not intended to replace advice given to you by your health care provider. Make sure you discuss any questions you have with your health care provider. Opciones de alimentos para pacientes con reflujo gastroesofgico (Food Choices for Gastroesophageal Reflux Disease) Cuando se tiene reflujo gastroesofgico (ERGE), los alimentos que se ingieren y los hbitos de alimentacin son muy importantes. Elegir los alimentos adecuados puede ayudar a Public house manager las molestias ocasionadas por el [place]. [place]?  Elija las frutas, los vegetales, los cereales integrales, los productos lcteos, la carne de [place], de pescado y de ave con bajo contenido de grasas.  Limite las grasas, como los Pabellones, los aderezos para [place], la [place], los frutos secos y Publishing copy.  Lleve un registro de las comidas para identificar los alimentos que ocasionan sntomas.  Evite los alimentos que le ocasionen reflujo. Pueden ser distintos para cada persona.  Haga comidas pequeas con frecuencia en lugar de tres comidas Kellogg.  Coma lentamente, en un clima distendido.  Limite el consumo de alimentos fritos.  Cocine los alimentos utilizando mtodos que no sean la fritura.  Evite el consumo alcohol.  Evite beber grandes cantidades de lquidos con las comidas.  Evite agacharse o recostarse hasta despus de 2 o 3horas de haber comido. QU ALIMENTOS NO SE RECOMIENDAN? Los siguientes son algunos alimentos y bebidas que pueden empeorar los sntomas: Astronomer. Jugo de tomate. Salsa de tomate y espagueti. Ajes. [place] y [place]. Rbano picante. Lambert Mody Naranjas, pomelos  y limn (fruta y [place]). Carnes Carnes de [place], de pescado y de ave con gran  contenido de grasas. Esto incluye los perros calientes, las [place], el [place], la salchicha, el salame y el tocino. Lcteos L[place]he entera y [place]. Rite Aid. Crema. [place]. Helados. Queso crema.  Bebidas Caf y t negro, con o sin cafena Bebidas gaseosas o energizantes. Condimentos Salsa picante. Salsa barbacoa.  Dulces/postres Chocolate y cacao. [place]. Menta y mentol. Grasas y Unisys Corporation con alto contenido de grasas, incluidas las papas fritas. Otros [place]. Especias picantes, como la Solectron Corporation, la pimienta blanca, la pimienta roja, la pimienta de cayena, el curry en [place], los clavos de [place], el jengibre y el [place] en polvo. Los artculos mencionados arriba pueden no ser Dean Foods Company de las bebidas y los alimentos que se Higher education careers adviser. Comunquese con el nutricionista para recibir ms informacin. Document Released: 01/12/2005 Document Revised: 04/09/2013 [facility] Patient Information 2015 [place]. This information is not intended to replace advice given to you by your health care provider. Make sure you discuss any questions you have with your health care provider.

## 2014-08-28 NOTE — MAU Provider Note (Signed)
History     CSN: [id number]  Arrival date and time: 08/27/14 2310   First Provider Initiated Contact with Patient 08/28/14 0002      No chief complaint on file.  HPI Comments: [facility] Kim [name] is a 44 y.o. [phone] at [redacted]w[redacted]d who presents today with abdominal pain. She states that she has had the pain all day, but it got worse this evening. She states that she took an antacid, and 2 tylenol. She states that the tylenol helped some. She states that she has been having urgency to have a BM, but then it will go away, and she does not have a bowel movement. She denies any vaginal bleeding or LOF. She states that the fetus has been active. She has had some white discharge.  Patient is being seen by Dr. Ruthann Cancer. She stopped going to the clinic.   Abdominal Pain This is a new problem. The current episode started today. The onset quality is gradual. The problem has been unchanged. The pain is located in the generalized abdominal region. The quality of the pain is cramping and dull. The abdominal pain does not radiate. Nothing aggravates the pain. The pain is relieved by nothing. She has tried acetaminophen and antacids for the symptoms. The treatment provided no relief.    Past Medical History  Diagnosis Date  . 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 Asthma     when pregnant  . Gestational diabetes     Past Surgical History  Procedure Laterality Date  . No past surgeries      Family History  Problem Relation Age of Onset  . Diabetes Mother   . Hypertension Mother   . Hyperlipidemia Father   . Diabetes Father   . Hypertension Father   . Heart disease Sister   . Diabetes Brother   . Hyperlipidemia Brother   . Drug abuse Paternal Grandmother   . Kidney disease Other   . Birth defects Other     anal atresia/stenosis    History  Substance Use Topics  . Smoking status: Never Smoker   . Smokeless tobacco: Never Used  . Alcohol  Use: Yes     Comment: RARE    Allergies: No Known Allergies  Prescriptions prior to admission  Medication Sig Dispense Refill Last Dose  . aspirin EC 81 MG tablet Take 1 tablet (81 mg total) by mouth daily. Take after 12 weeks for prevention of preeclampssia later in pregnancy 300 tablet 2 08/27/2014 at        . doxylamine, Sleep, (UNISOM) 25 MG tablet Take 1 tablet (25 mg total) by mouth at bedtime as needed for sleep. 30 tablet 3 08/26/2014 at Unknown time  . famotidine (PEPCID) 40 MG tablet Take 1 tablet (40 mg total) by mouth every evening. 30 tablet 5 08/27/2014 at Unknown time  . glyBURIDE (DIABETA) 2.5 MG tablet Take 1 tablet (2.5 mg total) by mouth 2 (two) times daily with a meal. 60 tablet 3 08/27/2014 at Unknown time  . Prenatal Multivit-Min-Fe-FA (PRENATAL VITAMINS) 0.8 MG tablet Take 1 tablet by mouth daily. 30 tablet 12 08/27/2014 at Unknown time  . ACCU-CHEK FASTCLIX LANCETS MISC 1 each by Percutaneous route 4 (four) times daily. 100 each 12 Taking  . glucose blood (ACCU-CHEK SMARTVIEW) test strip DX Gestational Diabetes O24.419 for testing 4 times daily 100 each 12 Taking  . LORazepam (ATIVAN) 0.5 MG tablet Take 1 tablet (  0.5 mg total) by mouth 2 (two) times daily as needed for anxiety. (Patient not taking: Reported on 03/31/2014) 30 tablet 1 Not Taking    Review of Systems  Gastrointestinal: Positive for abdominal pain.   Physical Exam   Blood pressure 97/60, pulse 85, temperature 98.1 F (36.7 C), temperature source Oral, resp. rate 18, height 5\' 5"  (1.651 m), weight 90.776 kg (200 lb 2 oz), last menstrual period 02/04/2014, SpO2 99 %.  Physical Exam  Nursing note and vitals reviewed. Constitutional: She is oriented to person, place, and time. She appears well-developed and well-nourished. No distress.  Cardiovascular: Normal rate.   Respiratory: Effort normal.  GI: Soft. There is no tenderness. There is no rebound.  Genitourinary:   Cervix: closed/thick/high    Neurological: She is alert and oriented to person, place, and time.  Skin: Skin is warm and dry.  Psychiatric: She has a normal mood and affect.     FHT: 140, moderate with 15x15 accels, no decels Toco: No UCs   Results for orders placed or performed during the hospital encounter of 08/27/14 (from the past 24 hour(s))  Urinalysis, Routine w reflex microscopic     Status: Abnormal   Collection Time: 08/27/14 11:20 PM  Result Value Ref Range   Color, Urine YELLOW YELLOW   APPearance CLEAR CLEAR   Specific Gravity, Urine 1.025 1.005 - 1.030   pH 6.0 5.0 - 8.0   Glucose, UA NEGATIVE NEGATIVE mg/dL   Hgb urine dipstick NEGATIVE NEGATIVE   Bilirubin Urine NEGATIVE NEGATIVE   Ketones, ur 15 (A) NEGATIVE mg/dL   Protein, ur NEGATIVE NEGATIVE mg/dL   Urobilinogen, UA 0.2 0.0 - 1.0 mg/dL   Nitrite NEGATIVE NEGATIVE   Leukocytes, UA NEGATIVE NEGATIVE  CBC     Status: Abnormal   Collection Time: 08/28/14 12:31 AM  Result Value Ref Range   WBC 6.1 4.0 - 10.5 K/uL   RBC 3.49 (L) 3.87 - 5.11 MIL/uL   Hemoglobin 10.7 (L) 12.0 - 15.0 g/dL   HCT 31.0 (L) 36.0 - 46.0 %   MCV 88.8 78.0 - 100.0 fL   MCH 30.7 26.0 - 34.0 pg   MCHC 34.5 30.0 - 36.0 g/dL   RDW 14.4 11.5 - 15.5 %   Platelets 189 150 - 400 K/uL  Comprehensive metabolic panel     Status: Abnormal   Collection Time: 08/28/14 12:31 AM  Result Value Ref Range   Sodium 135 135 - 145 mmol/L   Potassium 3.3 (L) 3.5 - 5.1 mmol/L   Chloride 109 101 - 111 mmol/L   CO2 19 (L) 22 - 32 mmol/L   Glucose, Bld 95 65 - 99 mg/dL   BUN 7 6 - 20 mg/dL   Creatinine, Ser 0.32 (L) 0.44 - 1.00 mg/dL   Calcium 7.8 (L) 8.9 - 10.3 mg/dL   Total Protein 6.0 (L) 6.5 - 8.1 g/dL   Albumin 2.7 (L) 3.5 - 5.0 g/dL   AST 16 15 - 41 U/L   ALT 11 (L) 14 - 54 U/L   Alkaline Phosphatase 106 38 - 126 U/L   Total Bilirubin 0.4 0.3 - 1.2 mg/dL   GFR calc non Af Amer >60 >60 mL/min   GFR calc Af Amer >60 >60 mL/min   Anion gap 7 5 - 15    MAU Course   Procedures  MDM 0103: Patient states that pain has improved with GI cocktail.   Assessment and Plan   1. Gastroesophageal reflux disease without esophagitis  2. [redacted] weeks gestation of pregnancy   3. Abdominal pain affecting pregnancy    DC home Stop Pepcid RX: zantac BID #60 Fetal kick counts PTL precautions Return to MAU as needed  Follow-up Information    Follow up with Frederico Hamman, MD.   Specialty:  Obstetrics and Gynecology   Why:  As scheduled   Contact information:   [address] [id number] [phone]        Mathis Bud 08/28/2014, 12:03 AM

## 2014-09-09 ENCOUNTER — Other Ambulatory Visit (HOSPITAL_COMMUNITY): Payer: Self-pay | Admitting: Maternal and Fetal Medicine

## 2014-09-09 ENCOUNTER — Ambulatory Visit (HOSPITAL_COMMUNITY)
Admission: RE | Admit: 2014-09-09 | Discharge: 2014-09-09 | Disposition: A | Discharge status: Home / Self Care | Payer: 59 | Source: Ambulatory Visit | Attending: Obstetrics | Admitting: Obstetrics

## 2014-09-09 ENCOUNTER — Encounter (HOSPITAL_COMMUNITY): Payer: Self-pay

## 2014-09-09 DIAGNOSIS — Z36 Encounter for antenatal screening of mother: Secondary | ICD-10-CM | POA: Insufficient documentation

## 2014-09-09 DIAGNOSIS — O24419 Gestational diabetes mellitus in pregnancy, unspecified control: Secondary | ICD-10-CM | POA: Diagnosis present

## 2014-09-09 DIAGNOSIS — O09522 Supervision of elderly multigravida, second trimester: Secondary | ICD-10-CM

## 2014-09-09 DIAGNOSIS — Z3A31 31 weeks gestation of pregnancy: Secondary | ICD-10-CM | POA: Diagnosis present

## 2014-09-09 DIAGNOSIS — O09523 Supervision of elderly multigravida, third trimester: Secondary | ICD-10-CM | POA: Insufficient documentation

## 2014-09-16 ENCOUNTER — Ambulatory Visit (HOSPITAL_COMMUNITY)
Admission: RE | Admit: 2014-09-16 | Discharge: 2014-09-16 | Disposition: A | Discharge status: Home / Self Care | Payer: 59 | Source: Ambulatory Visit | Attending: Obstetrics | Admitting: Obstetrics

## 2014-09-16 ENCOUNTER — Encounter (HOSPITAL_COMMUNITY): Payer: Self-pay

## 2014-09-16 ENCOUNTER — Other Ambulatory Visit (HOSPITAL_COMMUNITY): Payer: Self-pay | Admitting: Maternal and Fetal Medicine

## 2014-09-16 ENCOUNTER — Ambulatory Visit (HOSPITAL_COMMUNITY): Admission: RE | Admit: 2014-09-16 | Payer: 59 | Source: Ambulatory Visit

## 2014-09-16 DIAGNOSIS — O09522 Supervision of elderly multigravida, second trimester: Secondary | ICD-10-CM

## 2014-09-16 DIAGNOSIS — O09523 Supervision of elderly multigravida, third trimester: Secondary | ICD-10-CM | POA: Insufficient documentation

## 2014-09-16 DIAGNOSIS — Z3A32 32 weeks gestation of pregnancy: Secondary | ICD-10-CM | POA: Diagnosis not present

## 2014-09-16 DIAGNOSIS — O24419 Gestational diabetes mellitus in pregnancy, unspecified control: Secondary | ICD-10-CM

## 2014-09-16 DIAGNOSIS — O24313 Unspecified pre-existing diabetes mellitus in pregnancy, third trimester: Secondary | ICD-10-CM | POA: Diagnosis not present

## 2014-09-16 DIAGNOSIS — E119 Type 2 diabetes mellitus without complications: Secondary | ICD-10-CM | POA: Insufficient documentation

## 2014-09-19 ENCOUNTER — Encounter (HOSPITAL_COMMUNITY): Payer: Self-pay

## 2014-09-19 ENCOUNTER — Ambulatory Visit (HOSPITAL_COMMUNITY)
Admission: RE | Admit: 2014-09-19 | Discharge: 2014-09-19 | Disposition: A | Discharge status: Home / Self Care | Payer: 59 | Source: Ambulatory Visit | Attending: Obstetrics | Admitting: Obstetrics

## 2014-09-19 DIAGNOSIS — O24313 Unspecified pre-existing diabetes mellitus in pregnancy, third trimester: Secondary | ICD-10-CM | POA: Insufficient documentation

## 2014-09-19 DIAGNOSIS — Z3A32 32 weeks gestation of pregnancy: Secondary | ICD-10-CM | POA: Insufficient documentation

## 2014-09-19 DIAGNOSIS — O09529 Supervision of elderly multigravida, unspecified trimester: Secondary | ICD-10-CM | POA: Insufficient documentation

## 2014-09-19 DIAGNOSIS — E119 Type 2 diabetes mellitus without complications: Secondary | ICD-10-CM | POA: Diagnosis not present

## 2014-09-19 DIAGNOSIS — O09523 Supervision of elderly multigravida, third trimester: Secondary | ICD-10-CM | POA: Diagnosis not present

## 2014-09-19 NOTE — ED Notes (Signed)
Spanish interpreter Anastasio Auerbach with pt.

## 2014-09-19 NOTE — ED Notes (Signed)
Pt did not bring blood sugar log today.  Pt will bring her log to her next appt on Tuesday.

## 2014-09-23 ENCOUNTER — Ambulatory Visit (HOSPITAL_COMMUNITY)
Admission: RE | Admit: 2014-09-23 | Discharge: 2014-09-23 | Disposition: A | Discharge status: Home / Self Care | Payer: 59 | Source: Ambulatory Visit | Attending: Obstetrics | Admitting: Obstetrics

## 2014-09-23 ENCOUNTER — Encounter (HOSPITAL_COMMUNITY): Payer: Self-pay

## 2014-09-23 DIAGNOSIS — E119 Type 2 diabetes mellitus without complications: Secondary | ICD-10-CM | POA: Insufficient documentation

## 2014-09-23 DIAGNOSIS — O24419 Gestational diabetes mellitus in pregnancy, unspecified control: Secondary | ICD-10-CM

## 2014-09-23 DIAGNOSIS — O24313 Unspecified pre-existing diabetes mellitus in pregnancy, third trimester: Secondary | ICD-10-CM | POA: Diagnosis not present

## 2014-09-23 DIAGNOSIS — O09523 Supervision of elderly multigravida, third trimester: Secondary | ICD-10-CM | POA: Diagnosis not present

## 2014-09-23 DIAGNOSIS — O09522 Supervision of elderly multigravida, second trimester: Secondary | ICD-10-CM

## 2014-09-23 DIAGNOSIS — Z3A33 33 weeks gestation of pregnancy: Secondary | ICD-10-CM | POA: Insufficient documentation

## 2014-09-23 NOTE — ED Notes (Signed)
Please disregaurd fetal tracing at 1545.  Testing obix.

## 2014-09-24 DIAGNOSIS — O24419 Gestational diabetes mellitus in pregnancy, unspecified control: Secondary | ICD-10-CM | POA: Insufficient documentation

## 2014-09-24 DIAGNOSIS — O09529 Supervision of elderly multigravida, unspecified trimester: Secondary | ICD-10-CM | POA: Insufficient documentation

## 2014-09-24 DIAGNOSIS — Z3A33 33 weeks gestation of pregnancy: Secondary | ICD-10-CM | POA: Insufficient documentation

## 2014-09-26 ENCOUNTER — Encounter (HOSPITAL_COMMUNITY): Payer: Self-pay

## 2014-09-26 ENCOUNTER — Ambulatory Visit (HOSPITAL_COMMUNITY)
Admission: RE | Admit: 2014-09-26 | Discharge: 2014-09-26 | Disposition: A | Discharge status: Home / Self Care | Payer: 59 | Source: Ambulatory Visit | Attending: Obstetrics | Admitting: Obstetrics

## 2014-09-26 DIAGNOSIS — Z3A33 33 weeks gestation of pregnancy: Secondary | ICD-10-CM | POA: Diagnosis not present

## 2014-09-26 DIAGNOSIS — O09529 Supervision of elderly multigravida, unspecified trimester: Secondary | ICD-10-CM | POA: Diagnosis not present

## 2014-09-30 ENCOUNTER — Ambulatory Visit (HOSPITAL_COMMUNITY)
Admission: RE | Admit: 2014-09-30 | Discharge: 2014-09-30 | Disposition: A | Discharge status: Home / Self Care | Payer: 59 | Source: Ambulatory Visit | Attending: Obstetrics | Admitting: Obstetrics

## 2014-09-30 ENCOUNTER — Encounter (HOSPITAL_COMMUNITY): Payer: Self-pay

## 2014-09-30 DIAGNOSIS — O24313 Unspecified pre-existing diabetes mellitus in pregnancy, third trimester: Secondary | ICD-10-CM | POA: Insufficient documentation

## 2014-09-30 DIAGNOSIS — E119 Type 2 diabetes mellitus without complications: Secondary | ICD-10-CM | POA: Diagnosis not present

## 2014-09-30 DIAGNOSIS — Z3A34 34 weeks gestation of pregnancy: Secondary | ICD-10-CM | POA: Diagnosis not present

## 2014-09-30 DIAGNOSIS — O09523 Supervision of elderly multigravida, third trimester: Secondary | ICD-10-CM | POA: Insufficient documentation

## 2014-09-30 DIAGNOSIS — O24419 Gestational diabetes mellitus in pregnancy, unspecified control: Secondary | ICD-10-CM

## 2014-09-30 DIAGNOSIS — O09522 Supervision of elderly multigravida, second trimester: Secondary | ICD-10-CM

## 2014-09-30 DIAGNOSIS — O24113 Pre-existing diabetes mellitus, type 2, in pregnancy, third trimester: Secondary | ICD-10-CM | POA: Insufficient documentation

## 2014-10-03 ENCOUNTER — Ambulatory Visit (HOSPITAL_COMMUNITY)
Admission: RE | Admit: 2014-10-03 | Discharge: 2014-10-03 | Disposition: A | Discharge status: Home / Self Care | Payer: 59 | Source: Ambulatory Visit | Attending: Obstetrics | Admitting: Obstetrics

## 2014-10-03 DIAGNOSIS — E119 Type 2 diabetes mellitus without complications: Secondary | ICD-10-CM | POA: Diagnosis not present

## 2014-10-03 DIAGNOSIS — O24313 Unspecified pre-existing diabetes mellitus in pregnancy, third trimester: Secondary | ICD-10-CM | POA: Insufficient documentation

## 2014-10-03 DIAGNOSIS — Z3A34 34 weeks gestation of pregnancy: Secondary | ICD-10-CM | POA: Insufficient documentation

## 2014-10-03 DIAGNOSIS — O09523 Supervision of elderly multigravida, third trimester: Secondary | ICD-10-CM | POA: Diagnosis not present

## 2014-10-03 NOTE — ED Notes (Signed)
Pt did not bring blood sugar logs today. States her fasting this morning was 81.

## 2014-10-07 ENCOUNTER — Encounter (HOSPITAL_COMMUNITY): Payer: Self-pay

## 2014-10-07 ENCOUNTER — Ambulatory Visit (HOSPITAL_COMMUNITY)
Admission: RE | Admit: 2014-10-07 | Discharge: 2014-10-07 | Disposition: A | Discharge status: Home / Self Care | Payer: 59 | Source: Ambulatory Visit | Attending: Obstetrics | Admitting: Obstetrics

## 2014-10-07 DIAGNOSIS — O24313 Unspecified pre-existing diabetes mellitus in pregnancy, third trimester: Secondary | ICD-10-CM | POA: Insufficient documentation

## 2014-10-07 DIAGNOSIS — Z3A35 35 weeks gestation of pregnancy: Secondary | ICD-10-CM | POA: Diagnosis not present

## 2014-10-07 DIAGNOSIS — O24419 Gestational diabetes mellitus in pregnancy, unspecified control: Secondary | ICD-10-CM

## 2014-10-07 DIAGNOSIS — O09523 Supervision of elderly multigravida, third trimester: Secondary | ICD-10-CM | POA: Insufficient documentation

## 2014-10-07 DIAGNOSIS — E119 Type 2 diabetes mellitus without complications: Secondary | ICD-10-CM | POA: Diagnosis not present

## 2014-10-07 DIAGNOSIS — O09522 Supervision of elderly multigravida, second trimester: Secondary | ICD-10-CM

## 2014-10-07 NOTE — ED Notes (Signed)
Spanish interpretor with pt at time of visit

## 2014-10-08 ENCOUNTER — Other Ambulatory Visit (HOSPITAL_COMMUNITY): Payer: Self-pay | Admitting: Obstetrics

## 2014-10-09 LAB — OB RESULTS CONSOLE GBS: GBS: POSITIVE

## 2014-10-10 ENCOUNTER — Encounter (HOSPITAL_COMMUNITY): Payer: Self-pay

## 2014-10-10 ENCOUNTER — Ambulatory Visit (HOSPITAL_COMMUNITY)
Admission: RE | Admit: 2014-10-10 | Discharge: 2014-10-10 | Disposition: A | Discharge status: Home / Self Care | Payer: 59 | Source: Ambulatory Visit | Attending: Obstetrics | Admitting: Obstetrics

## 2014-10-10 VITALS — BP 120/59 | HR 84 | Wt 194.0 lb

## 2014-10-10 DIAGNOSIS — Z3A35 35 weeks gestation of pregnancy: Secondary | ICD-10-CM | POA: Insufficient documentation

## 2014-10-10 DIAGNOSIS — O09523 Supervision of elderly multigravida, third trimester: Secondary | ICD-10-CM | POA: Insufficient documentation

## 2014-10-10 DIAGNOSIS — O24113 Pre-existing diabetes mellitus, type 2, in pregnancy, third trimester: Secondary | ICD-10-CM | POA: Insufficient documentation

## 2014-10-10 DIAGNOSIS — Z3A38 38 weeks gestation of pregnancy: Secondary | ICD-10-CM | POA: Insufficient documentation

## 2014-10-10 DIAGNOSIS — O09521 Supervision of elderly multigravida, first trimester: Secondary | ICD-10-CM

## 2014-10-14 ENCOUNTER — Ambulatory Visit (HOSPITAL_COMMUNITY)
Admission: RE | Admit: 2014-10-14 | Discharge: 2014-10-14 | Disposition: A | Discharge status: Home / Self Care | Payer: 59 | Source: Ambulatory Visit | Attending: Obstetrics | Admitting: Obstetrics

## 2014-10-14 DIAGNOSIS — O09522 Supervision of elderly multigravida, second trimester: Secondary | ICD-10-CM | POA: Insufficient documentation

## 2014-10-14 DIAGNOSIS — Z3A36 36 weeks gestation of pregnancy: Secondary | ICD-10-CM | POA: Insufficient documentation

## 2014-10-14 DIAGNOSIS — Z8632 Personal history of gestational diabetes: Secondary | ICD-10-CM | POA: Insufficient documentation

## 2014-10-14 DIAGNOSIS — O24419 Gestational diabetes mellitus in pregnancy, unspecified control: Secondary | ICD-10-CM

## 2014-10-17 ENCOUNTER — Encounter (HOSPITAL_COMMUNITY): Payer: Self-pay

## 2014-10-17 ENCOUNTER — Ambulatory Visit (HOSPITAL_COMMUNITY)
Admission: RE | Admit: 2014-10-17 | Discharge: 2014-10-17 | Disposition: A | Discharge status: Home / Self Care | Payer: 59 | Source: Ambulatory Visit | Attending: Obstetrics | Admitting: Obstetrics

## 2014-10-17 DIAGNOSIS — O09523 Supervision of elderly multigravida, third trimester: Secondary | ICD-10-CM | POA: Insufficient documentation

## 2014-10-17 DIAGNOSIS — Z3A37 37 weeks gestation of pregnancy: Secondary | ICD-10-CM | POA: Insufficient documentation

## 2014-10-21 ENCOUNTER — Ambulatory Visit (HOSPITAL_COMMUNITY)
Admission: RE | Admit: 2014-10-21 | Discharge: 2014-10-21 | Disposition: A | Discharge status: Home / Self Care | Payer: 59 | Source: Ambulatory Visit | Attending: Obstetrics | Admitting: Obstetrics

## 2014-10-21 ENCOUNTER — Other Ambulatory Visit (HOSPITAL_COMMUNITY): Payer: Self-pay | Admitting: Obstetrics

## 2014-10-21 DIAGNOSIS — O09523 Supervision of elderly multigravida, third trimester: Secondary | ICD-10-CM | POA: Insufficient documentation

## 2014-10-21 DIAGNOSIS — O24313 Unspecified pre-existing diabetes mellitus in pregnancy, third trimester: Secondary | ICD-10-CM | POA: Insufficient documentation

## 2014-10-21 DIAGNOSIS — E119 Type 2 diabetes mellitus without complications: Secondary | ICD-10-CM | POA: Diagnosis not present

## 2014-10-21 DIAGNOSIS — Z3A37 37 weeks gestation of pregnancy: Secondary | ICD-10-CM | POA: Diagnosis not present

## 2014-10-21 NOTE — ED Notes (Signed)
Pt did not bring her blood sugars with her today.

## 2014-10-21 NOTE — ED Notes (Signed)
Interpreter present for today's appointment.  Pt complains of leaking that she felt yesterday and the day before, but none today.

## 2014-10-24 ENCOUNTER — Other Ambulatory Visit (HOSPITAL_COMMUNITY): Payer: Self-pay | Admitting: Maternal and Fetal Medicine

## 2014-10-24 ENCOUNTER — Encounter (HOSPITAL_COMMUNITY): Payer: Self-pay

## 2014-10-24 ENCOUNTER — Ambulatory Visit (HOSPITAL_COMMUNITY)
Admission: RE | Admit: 2014-10-24 | Discharge: 2014-10-24 | Disposition: A | Discharge status: Home / Self Care | Payer: 59 | Source: Ambulatory Visit | Attending: Obstetrics | Admitting: Obstetrics

## 2014-10-24 DIAGNOSIS — Z3A37 37 weeks gestation of pregnancy: Secondary | ICD-10-CM | POA: Diagnosis not present

## 2014-10-24 DIAGNOSIS — E119 Type 2 diabetes mellitus without complications: Secondary | ICD-10-CM | POA: Insufficient documentation

## 2014-10-24 DIAGNOSIS — O09523 Supervision of elderly multigravida, third trimester: Secondary | ICD-10-CM

## 2014-10-24 DIAGNOSIS — O24313 Unspecified pre-existing diabetes mellitus in pregnancy, third trimester: Secondary | ICD-10-CM | POA: Diagnosis not present

## 2014-10-24 DIAGNOSIS — Z3A38 38 weeks gestation of pregnancy: Secondary | ICD-10-CM

## 2014-10-24 NOTE — ED Notes (Signed)
Pt did not bring blood sugar log today.

## 2014-10-28 ENCOUNTER — Encounter (HOSPITAL_COMMUNITY): Payer: Self-pay

## 2014-10-28 ENCOUNTER — Ambulatory Visit (HOSPITAL_COMMUNITY)
Admission: RE | Admit: 2014-10-28 | Discharge: 2014-10-28 | Disposition: A | Discharge status: Home / Self Care | Payer: 59 | Source: Ambulatory Visit | Attending: Obstetrics | Admitting: Obstetrics

## 2014-10-28 DIAGNOSIS — O09523 Supervision of elderly multigravida, third trimester: Secondary | ICD-10-CM | POA: Diagnosis not present

## 2014-10-28 DIAGNOSIS — O24313 Unspecified pre-existing diabetes mellitus in pregnancy, third trimester: Secondary | ICD-10-CM | POA: Diagnosis not present

## 2014-10-28 DIAGNOSIS — Z3A37 37 weeks gestation of pregnancy: Secondary | ICD-10-CM | POA: Insufficient documentation

## 2014-10-29 ENCOUNTER — Other Ambulatory Visit (HOSPITAL_COMMUNITY): Payer: Self-pay | Admitting: Obstetrics

## 2014-10-31 ENCOUNTER — Ambulatory Visit (HOSPITAL_COMMUNITY)
Admission: RE | Admit: 2014-10-31 | Discharge: 2014-10-31 | Disposition: A | Discharge status: Home / Self Care | Payer: 59 | Source: Ambulatory Visit | Attending: Obstetrics | Admitting: Obstetrics

## 2014-10-31 DIAGNOSIS — O09523 Supervision of elderly multigravida, third trimester: Secondary | ICD-10-CM | POA: Diagnosis not present

## 2014-10-31 DIAGNOSIS — Z3A38 38 weeks gestation of pregnancy: Secondary | ICD-10-CM | POA: Diagnosis not present

## 2014-10-31 DIAGNOSIS — O24419 Gestational diabetes mellitus in pregnancy, unspecified control: Secondary | ICD-10-CM | POA: Insufficient documentation

## 2014-10-31 NOTE — Progress Notes (Signed)
MFM note  45 yo G5P4004, GDM on glyburide- currently at 38w 3d Reactive NST without contractions  Recommend continued antenatal testing as scheduled. Delivery at 39 weeks in the absence of other complications.  Benjaman Lobe, MD Maternal Fetal Medicine

## 2014-11-04 ENCOUNTER — Inpatient Hospital Stay (HOSPITAL_COMMUNITY)
Admission: AD | Admit: 2014-11-04 | Discharge: 2014-11-06 | DRG: 775 | Disposition: A | Discharge status: Home / Self Care | Payer: 59 | Source: Ambulatory Visit | Attending: Obstetrics | Admitting: Obstetrics

## 2014-11-04 ENCOUNTER — Inpatient Hospital Stay (HOSPITAL_COMMUNITY): Payer: 59 | Admitting: Anesthesiology

## 2014-11-04 ENCOUNTER — Encounter (HOSPITAL_COMMUNITY): Payer: Self-pay | Admitting: *Deleted

## 2014-11-04 ENCOUNTER — Ambulatory Visit (HOSPITAL_COMMUNITY): Payer: 59

## 2014-11-04 ENCOUNTER — Inpatient Hospital Stay (HOSPITAL_COMMUNITY): Admission: RE | Admit: 2014-11-04 | Payer: 59 | Source: Ambulatory Visit

## 2014-11-04 DIAGNOSIS — O99824 Streptococcus B carrier state complicating childbirth: Secondary | ICD-10-CM | POA: Diagnosis present

## 2014-11-04 DIAGNOSIS — Z3491 Encounter for supervision of normal pregnancy, unspecified, first trimester: Secondary | ICD-10-CM

## 2014-11-04 DIAGNOSIS — O99211 Obesity complicating pregnancy, first trimester: Secondary | ICD-10-CM

## 2014-11-04 DIAGNOSIS — O09521 Supervision of elderly multigravida, first trimester: Secondary | ICD-10-CM

## 2014-11-04 DIAGNOSIS — O24429 Gestational diabetes mellitus in childbirth, unspecified control: Principal | ICD-10-CM | POA: Diagnosis present

## 2014-11-04 DIAGNOSIS — Z79899 Other long term (current) drug therapy: Secondary | ICD-10-CM

## 2014-11-04 DIAGNOSIS — Z3A39 39 weeks gestation of pregnancy: Secondary | ICD-10-CM | POA: Diagnosis present

## 2014-11-04 DIAGNOSIS — O09523 Supervision of elderly multigravida, third trimester: Secondary | ICD-10-CM | POA: Diagnosis not present

## 2014-11-04 DIAGNOSIS — O24419 Gestational diabetes mellitus in pregnancy, unspecified control: Secondary | ICD-10-CM | POA: Diagnosis present

## 2014-11-04 LAB — GLUCOSE, CAPILLARY: Glucose-Capillary: 66 mg/dL (ref 65–99)

## 2014-11-04 LAB — CBC
HCT: 33.1 % — ABNORMAL LOW (ref 36.0–46.0)
Hemoglobin: 11 g/dL — ABNORMAL LOW (ref 12.0–15.0)
MCH: 29.7 pg (ref 26.0–34.0)
MCHC: 33.2 g/dL (ref 30.0–36.0)
MCV: 89.5 fL (ref 78.0–100.0)
Platelets: 177 10*3/uL (ref 150–400)
RBC: 3.7 MIL/uL — ABNORMAL LOW (ref 3.87–5.11)
RDW: 15.2 % (ref 11.5–15.5)
WBC: 7 10*3/uL (ref 4.0–10.5)

## 2014-11-04 LAB — RPR: RPR Ser Ql: NONREACTIVE

## 2014-11-04 LAB — TYPE AND SCREEN
ABO/RH(D): O POS
Antibody Screen: NEGATIVE

## 2014-11-04 LAB — ABO/RH: ABO/RH(D): O POS

## 2014-11-04 MED ORDER — ONDANSETRON HCL 4 MG/2ML IJ SOLN: 4.0000 mg | Freq: Four times a day (QID) | INTRAMUSCULAR | Status: DC | PRN

## 2014-11-04 MED ORDER — TETANUS-DIPHTH-ACELL PERTUSSIS 5-2.5-18.5 LF-MCG/0.5 IM SUSP: 0.5000 mL | Freq: Once | INTRAMUSCULAR | Status: DC

## 2014-11-04 MED ORDER — FENTANYL 2.5 MCG/ML BUPIVACAINE 1/10 % EPIDURAL INFUSION (WH - ANES)
14.0000 mL/h | INTRAMUSCULAR | Status: DC | PRN
  Administered 2014-11-04: 14 mL/h via EPIDURAL
  Filled 2014-11-04: qty 125

## 2014-11-04 MED ORDER — IBUPROFEN 600 MG PO TABS
600.0000 mg | ORAL_TABLET | Freq: Four times a day (QID) | ORAL | Status: DC
  Administered 2014-11-04 – 2014-11-06 (×9): 600 mg via ORAL
  Filled 2014-11-04 (×9): qty 1

## 2014-11-04 MED ORDER — LANOLIN HYDROUS EX OINT: TOPICAL_OINTMENT | CUTANEOUS | Status: DC | PRN

## 2014-11-04 MED ORDER — MISOPROSTOL 25 MCG QUARTER TABLET
25.0000 ug | ORAL_TABLET | Freq: Once | ORAL | Status: AC
  Administered 2014-11-04: 25 ug via VAGINAL
  Filled 2014-11-04: qty 1
  Filled 2014-11-04: qty 0.25

## 2014-11-04 MED ORDER — WITCH HAZEL-GLYCERIN EX PADS
1.0000 "application " | MEDICATED_PAD | CUTANEOUS | Status: DC | PRN
  Administered 2014-11-06: 1 via TOPICAL

## 2014-11-04 MED ORDER — DIPHENHYDRAMINE HCL 50 MG/ML IJ SOLN: 12.5000 mg | INTRAMUSCULAR | Status: DC | PRN

## 2014-11-04 MED ORDER — DEXTROSE 5 % IV SOLN
5.0000 10*6.[IU] | Freq: Once | INTRAVENOUS | Status: AC
  Administered 2014-11-04: 5 10*6.[IU] via INTRAVENOUS
  Filled 2014-11-04: qty 5

## 2014-11-04 MED ORDER — LACTATED RINGERS IV SOLN
500.0000 mL | INTRAVENOUS | Status: DC | PRN
  Administered 2014-11-04: 500 mL via INTRAVENOUS

## 2014-11-04 MED ORDER — MISOPROSTOL 25 MCG QUARTER TABLET: 25.0000 ug | ORAL_TABLET | ORAL | Status: DC | PRN

## 2014-11-04 MED ORDER — SIMETHICONE 80 MG PO CHEW
80.0000 mg | CHEWABLE_TABLET | ORAL | Status: DC | PRN
  Administered 2014-11-05: 80 mg via ORAL
  Filled 2014-11-04: qty 1

## 2014-11-04 MED ORDER — ACETAMINOPHEN 325 MG PO TABS: 650.0000 mg | ORAL_TABLET | ORAL | Status: DC | PRN

## 2014-11-04 MED ORDER — LIDOCAINE HCL (PF) 1 % IJ SOLN
30.0000 mL | INTRAMUSCULAR | Status: DC | PRN
  Filled 2014-11-04: qty 30

## 2014-11-04 MED ORDER — LACTATED RINGERS IV SOLN
INTRAVENOUS | Status: DC
  Administered 2014-11-04 (×2): via INTRAVENOUS

## 2014-11-04 MED ORDER — CITRIC ACID-SODIUM CITRATE 334-500 MG/5ML PO SOLN: 30.0000 mL | ORAL | Status: DC | PRN

## 2014-11-04 MED ORDER — SENNOSIDES-DOCUSATE SODIUM 8.6-50 MG PO TABS
2.0000 | ORAL_TABLET | ORAL | Status: DC
  Administered 2014-11-04 – 2014-11-06 (×2): 2 via ORAL
  Filled 2014-11-04 (×2): qty 2

## 2014-11-04 MED ORDER — PRENATAL MULTIVITAMIN CH
1.0000 | ORAL_TABLET | Freq: Every day | ORAL | Status: DC
  Administered 2014-11-05 – 2014-11-06 (×2): 1 via ORAL
  Filled 2014-11-04 (×2): qty 1

## 2014-11-04 MED ORDER — PENICILLIN G POTASSIUM 5000000 UNITS IJ SOLR
2.5000 10*6.[IU] | INTRAVENOUS | Status: DC
  Filled 2014-11-04 (×5): qty 2.5

## 2014-11-04 MED ORDER — FLEET ENEMA 7-19 GM/118ML RE ENEM: 1.0000 | ENEMA | RECTAL | Status: DC | PRN

## 2014-11-04 MED ORDER — OXYCODONE-ACETAMINOPHEN 5-325 MG PO TABS
1.0000 | ORAL_TABLET | ORAL | Status: DC | PRN
  Administered 2014-11-05: 1 via ORAL

## 2014-11-04 MED ORDER — ONDANSETRON HCL 4 MG/2ML IJ SOLN: 4.0000 mg | INTRAMUSCULAR | Status: DC | PRN

## 2014-11-04 MED ORDER — ZOLPIDEM TARTRATE 5 MG PO TABS: 5.0000 mg | ORAL_TABLET | Freq: Every evening | ORAL | Status: DC | PRN

## 2014-11-04 MED ORDER — OXYCODONE-ACETAMINOPHEN 5-325 MG PO TABS: 2.0000 | ORAL_TABLET | ORAL | Status: DC | PRN

## 2014-11-04 MED ORDER — OXYTOCIN 40 UNITS IN LACTATED RINGERS INFUSION - SIMPLE MED: 62.5000 mL/h | INTRAVENOUS | Status: DC

## 2014-11-04 MED ORDER — DIPHENHYDRAMINE HCL 25 MG PO CAPS: 25.0000 mg | ORAL_CAPSULE | Freq: Four times a day (QID) | ORAL | Status: DC | PRN

## 2014-11-04 MED ORDER — DIBUCAINE 1 % RE OINT: 1.0000 "application " | TOPICAL_OINTMENT | RECTAL | Status: DC | PRN

## 2014-11-04 MED ORDER — OXYCODONE-ACETAMINOPHEN 5-325 MG PO TABS
1.0000 | ORAL_TABLET | ORAL | Status: DC | PRN
  Administered 2014-11-04 – 2014-11-06 (×2): 1 via ORAL
  Filled 2014-11-04 (×3): qty 1

## 2014-11-04 MED ORDER — FERROUS SULFATE 325 (65 FE) MG PO TABS
325.0000 mg | ORAL_TABLET | Freq: Two times a day (BID) | ORAL | Status: DC
  Administered 2014-11-04 – 2014-11-05 (×3): 325 mg via ORAL
  Filled 2014-11-04 (×4): qty 1

## 2014-11-04 MED ORDER — MISOPROSTOL 200 MCG PO TABS
ORAL_TABLET | ORAL | Status: AC
  Filled 2014-11-04: qty 5

## 2014-11-04 MED ORDER — PHENYLEPHRINE 40 MCG/ML (10ML) SYRINGE FOR IV PUSH (FOR BLOOD PRESSURE SUPPORT)
80.0000 ug | PREFILLED_SYRINGE | INTRAVENOUS | Status: DC | PRN
  Filled 2014-11-04: qty 20
  Filled 2014-11-04: qty 2

## 2014-11-04 MED ORDER — OXYCODONE-ACETAMINOPHEN 5-325 MG PO TABS
2.0000 | ORAL_TABLET | ORAL | Status: DC | PRN
  Administered 2014-11-04 – 2014-11-06 (×5): 2 via ORAL
  Filled 2014-11-04 (×5): qty 2

## 2014-11-04 MED ORDER — BENZOCAINE-MENTHOL 20-0.5 % EX AERO
1.0000 "application " | INHALATION_SPRAY | CUTANEOUS | Status: DC | PRN
  Administered 2014-11-04: 1 via TOPICAL
  Filled 2014-11-04: qty 56

## 2014-11-04 MED ORDER — OXYTOCIN 40 UNITS IN LACTATED RINGERS INFUSION - SIMPLE MED
1.0000 m[IU]/min | INTRAVENOUS | Status: DC
  Administered 2014-11-04: 2 m[IU]/min via INTRAVENOUS
  Filled 2014-11-04: qty 1000

## 2014-11-04 MED ORDER — OXYTOCIN BOLUS FROM INFUSION
500.0000 mL | INTRAVENOUS | Status: DC
  Administered 2014-11-04: 500 mL via INTRAVENOUS

## 2014-11-04 MED ORDER — BUTORPHANOL TARTRATE 1 MG/ML IJ SOLN
1.0000 mg | INTRAMUSCULAR | Status: DC | PRN
  Administered 2014-11-04 (×2): 1 mg via INTRAVENOUS
  Filled 2014-11-04 (×2): qty 1

## 2014-11-04 MED ORDER — EPHEDRINE 5 MG/ML INJ
10.0000 mg | INTRAVENOUS | Status: DC | PRN
  Filled 2014-11-04: qty 2

## 2014-11-04 MED ORDER — ONDANSETRON HCL 4 MG PO TABS: 4.0000 mg | ORAL_TABLET | ORAL | Status: DC | PRN

## 2014-11-04 MED ORDER — TERBUTALINE SULFATE 1 MG/ML IJ SOLN
0.2500 mg | Freq: Once | INTRAMUSCULAR | Status: DC | PRN
Start: 2014-11-04 — End: 2014-11-04
  Filled 2014-11-04: qty 1

## 2014-11-04 MED ORDER — LIDOCAINE HCL (PF) 1 % IJ SOLN
INTRAMUSCULAR | Status: DC | PRN
  Administered 2014-11-04 (×2): 4 mL

## 2014-11-04 NOTE — Progress Notes (Signed)
I stopped to check on patients need, I  Left my name and number and case she want me order her meal.by Juliann Mule Spanish Interpreter

## 2014-11-04 NOTE — Lactation Note (Signed)
This note was copied from the chart of [place]. Lactation Consultation Note  Patient Name: Kim [name] Today's Date: 11/04/2014 Reason for consult: Initial assessment FOB and Daughter present to interpret for Mom. Called by Adm NSY RN to assist Mom with BF due to low CBG=35. Mom had baby latched when [facility] arrived, assisted Mom to obtain more depth. Baby demonstrating a good rhythmic suck with swallowing motions noted. Mom is experienced BF, denies questions or concerns. LC encouraged to BF with feeding ques. Lactation brochure left for review, advised of OP services and support group. Adm NSY RN advised of feeding. Encouraged to call for assist if desired.   Maternal Data Has patient been taught Hand Expression?: No Does the patient have breastfeeding experience prior to this delivery?: Yes  Feeding Feeding Type: Breast Fed Length of feed: 25 min  LATCH Score/Interventions Latch: Grasps breast easily, tongue down, lips flanged, rhythmical sucking.  Audible Swallowing: A few with stimulation Intervention(s): Skin to skin  Type of Nipple: Everted at rest and after stimulation  Comfort (Breast/Nipple): Soft / non-tender     Hold (Positioning): No assistance needed to correctly position infant at breast. Intervention(s): Skin to skin;Breastfeeding basics reviewed;Support Pillows;Position options  LATCH Score: 9  Lactation Tools Discussed/Used WIC Program: Yes   Consult Status Consult Status: Follow-up Date: 11/05/14 Follow-up type: In-patient    Katrine Coho 11/04/2014, 3:26 PM

## 2014-11-04 NOTE — H&P (Signed)
This is Dr. Gracy Racer dictating the history and physical on  [place]  she is a 44 year old gravida 5 para 4004 positive GBS at 56 weeks EDC 11/11/2014 in for induction during this pregnancy followed by MFM because of diabetes she's been on glyburide 1.2 5 AM and p.m. and has been having nonstress tests and ultrasounds weekly she is in for induction today she received penicillin Cytotec was inserted on admission at 3 AM this morning and she was started on Pitocin at 7 AM her cervix is a fingertip 70% with the vertex at a -3 station membranes intact Past medical history she is a gestational diabetic Past surgical history negative Social history denies smoking drinking alcohol use Family history negative System review noncontributory Physical exam well-developed female in no distress HEENT negative Lungs clear to P&A Heart regular rhythm no murmurs no gallops Breasts negative Abdomen term Pelvic as described above Extremities negative

## 2014-11-04 NOTE — Anesthesia Preprocedure Evaluation (Signed)
Anesthesia Evaluation  Patient identified by MRN, date of birth, ID band Patient awake    Reviewed: Allergy & Precautions, H&P , NPO status , Patient's Chart, lab work & pertinent test results, reviewed documented beta blocker date and time   Airway Mallampati: III  TM Distance: >3 FB Neck ROM: full    Dental no notable dental hx.    Pulmonary asthma ,  breath sounds clear to auscultation  Pulmonary exam normal       Cardiovascular negative cardio ROS Normal cardiovascular examRhythm:regular Rate:Normal     Neuro/Psych negative neurological ROS  negative psych ROS   GI/Hepatic negative GI ROS, Neg liver ROS,   Endo/Other  diabetes  Renal/GU negative Renal ROS  negative genitourinary   Musculoskeletal   Abdominal   Peds  Hematology negative hematology ROS (+)   Anesthesia Other Findings   Reproductive/Obstetrics (+) Pregnancy                             Anesthesia Physical Anesthesia Plan  ASA: III  Anesthesia Plan: Epidural   Post-op Pain Management:    Induction:   Airway Management Planned:   Additional Equipment:   Intra-op Plan:   Post-operative Plan:   Informed Consent: I have reviewed the patients History and Physical, chart, labs and discussed the procedure including the risks, benefits and alternatives for the proposed anesthesia with the patient or authorized representative who has indicated his/her understanding and acceptance.     Plan Discussed with:   Anesthesia Plan Comments:         Anesthesia Quick Evaluation

## 2014-11-04 NOTE — Anesthesia Procedure Notes (Addendum)
Epidural Patient location during procedure: OB  Staffing Anesthesiologist: Raquell Richer Performed by: anesthesiologist   Preanesthetic Checklist Completed: patient identified, site marked, surgical consent, pre-op evaluation, timeout performed, IV checked, risks and benefits discussed and monitors and equipment checked  Epidural Patient position: sitting Prep: site prepped and draped and DuraPrep Patient monitoring: continuous pulse ox and blood pressure Approach: midline Location: L2-L3 Injection technique: LOR air  Needle:  Needle type: Tuohy  Needle gauge: 17 G Needle length: 9 cm and 9 Needle insertion depth: 6 cm Catheter type: closed end flexible Catheter size: 19 Gauge Catheter at skin depth: 10 cm Test dose: negative  Assessment Events: blood not aspirated, injection not painful, no injection resistance, negative IV test and no paresthesia  Additional Notes Patient identified. Risks/Benefits/Options discussed with patient including but not limited to bleeding, infection, nerve damage, paralysis, failed block, incomplete pain control, headache, blood pressure changes, nausea, vomiting, reactions to medication both or allergic, itching and postpartum back pain. Confirmed with bedside nurse the patient's most recent platelet count. Confirmed with patient that they are not currently taking any anticoagulation, have any bleeding history or any family history of bleeding disorders. Patient expressed understanding and wished to proceed. All questions were answered. Sterile technique was used throughout the entire procedure. Please see nursing notes for vital signs. Test dose was given through epidural catheter and negative prior to continuing to dose epidural or start infusion. Warning signs of high block given to the patient including shortness of breath, tingling/numbness in hands, complete motor block, or any concerning symptoms with instructions to call for help. Patient was  given instructions on fall risk and not to get out of bed. All questions and concerns addressed with instructions to call with any issues or inadequate analgesia.    Spanish interpreter used

## 2014-11-05 LAB — CBC
HCT: 30.6 % — ABNORMAL LOW (ref 36.0–46.0)
Hemoglobin: 10.4 g/dL — ABNORMAL LOW (ref 12.0–15.0)
MCH: 30.5 pg (ref 26.0–34.0)
MCHC: 34 g/dL (ref 30.0–36.0)
MCV: 89.7 fL (ref 78.0–100.0)
Platelets: 146 10*3/uL — ABNORMAL LOW (ref 150–400)
RBC: 3.41 MIL/uL — ABNORMAL LOW (ref 3.87–5.11)
RDW: 15.4 % (ref 11.5–15.5)
WBC: 7.6 10*3/uL (ref 4.0–10.5)

## 2014-11-05 NOTE — Progress Notes (Signed)
Patient stated  that she will order her own meals. [place]  Interpreter.

## 2014-11-05 NOTE — Anesthesia Postprocedure Evaluation (Signed)
Anesthesia Post Note  Patient: [facility]  Procedure(s) Performed: * No procedures listed *  Anesthesia type: Epidural  Patient location: Mother/Baby  Post pain: Pain level controlled  Post assessment: Post-op Vital signs reviewed  Last Vitals:  Filed Vitals:   11/05/14 0657  BP: 96/50  Pulse: 60  Temp: 36.8 C  Resp: 18    Post vital signs: Reviewed  Level of consciousness:alert  Complications: No apparent anesthesia complications

## 2014-11-05 NOTE — Clinical Social Work Maternal (Signed)
  CLINICAL SOCIAL WORK MATERNAL/CHILD NOTE  Patient Details  Name: Kim [name] MRN: [id number] Date of Birth: [birth year]  Date:  11/05/2014  Clinical Social Worker Initiating Note:  Kim Ferrara, LCSW Date/ Time Initiated:  11/05/14/1530     Child's Name:  Kim [name]    Legal Guardian: Kim [name] (mother) and Kim [name] (father)  Need for Interpreter:  Spanish   Date of Referral:  11/04/14     Reason for Referral: History of depression  Referral Source:  Central Nursery   Address:  [address] [place] [address]  Phone number:  [phone]   Household Members:  Minor Children, Spouse   Natural Supports (not living in the home):      Professional Supports: None   Financial Resources:  Multimedia programmer   Other Resources:    None identified  Cultural/Religious Considerations Which May Impact Care:  Spanish speaking  Strengths:  Ability to meet basic needs , Home prepared for child    Risk Factors/Current Problems:   1)Mental Health Concerns: MOB presents with history of depression and anxiety after her last child was born in 2009. She reported that she has previously been prescribed medications (but cannot recall name of rx), but has not had medications in approximately 2 years. MOB denied mental health concerns or needs during this pregnancy. She reported that she currently feels "great".    Cognitive State:  Alert , Able to Concentrate , Insightful , Goal Oriented , Linear Thinking    Mood/Affect:  Euthymic , Comfortable , Calm , Interested    CSW Assessment:  CSW received request for consult due to MOB presenting with a history of depression and anxiety. MOB provided consent for the FOB and her children to remain in the room during the visit.  MOB displayed an appropriate range in affect and presented in a pleasant mood. She was noted to be smiling as she introduced the infant to the CSW.  MOB denied questions, concerns,  or needs as she transitions to the postpartum period and prepares to return home.  MOB discussed feeling well supported by her family.   MOB acknowledged history of depression and anxiety, but reported onset of symptoms after her last child was born in 2009. She stated that she has previously been prescribed medication (cannot recall name) by her PCP, Kim [name].  MOB denied previous participation in therapy. Per MOB, she was last treated with medications approximately 2 years ago.  MOB denied mental health symptoms during the pregnancy, and presented with awareness of her increased risk for developing symptoms now postpartum due to her history.  MOB expressed awareness of need to follow up with her Kim [name] as soon as she notes onset of symptoms to reduce likelihood of escalation of symptoms.  MOB voiced intention to follow up with her PCP if she notes return of symptoms. MOB denied additional questions, concerns, or needs at this time. She expressed appreciation for the visit and agreed to contact CSW if needs arise during the admission.   CSW Plan/Description:   1)Patient/Family Education: Perinatal mood and anxiety disorders. MOB to follow up with PCP if she notes symptoms.  2)No Further Intervention Required/No Barriers to Discharge    Kim [name] 11/05/2014, 3:59 PM

## 2014-11-05 NOTE — Progress Notes (Signed)
Post Partum Day 1 Subjective: no complaints  Objective: Blood pressure 96/50, pulse 60, temperature 98.2 F (36.8 C), temperature source Oral, resp. rate 18, height 5\' 5"  (1.651 m), weight 193 lb (87.544 kg), last menstrual period 02/04/2014, SpO2 95 %, unknown if currently breastfeeding.  Physical Exam:  General: alert and no distress Lochia: appropriate Uterine Fundus: firm Incision: none DVT Evaluation: No evidence of DVT seen on physical exam.   Recent Labs  11/04/14 0210 11/05/14 0545  HGB 11.0* 10.4*  HCT 33.1* 30.6*    Assessment/Plan: Plan for discharge tomorrow   LOS: 1 day   HARPER,CHARLES A 11/05/2014, 8:52 AM

## 2014-11-06 ENCOUNTER — Encounter (HOSPITAL_COMMUNITY): Payer: Self-pay | Admitting: *Deleted

## 2014-11-06 MED ORDER — OXYCODONE-ACETAMINOPHEN 5-325 MG PO TABS: 2.0000 | ORAL_TABLET | ORAL | Status: DC | PRN

## 2014-11-06 MED ORDER — IBUPROFEN 600 MG PO TABS: 600.0000 mg | ORAL_TABLET | Freq: Four times a day (QID) | ORAL | Status: DC | PRN

## 2014-11-06 NOTE — Discharge Summary (Signed)
Obstetric Discharge Summary Reason for Admission: onset of labor Prenatal Procedures: ultrasound Intrapartum Procedures: spontaneous vaginal delivery Postpartum Procedures: none Complications-Operative and Postpartum: none HEMOGLOBIN  Date Value Ref Range Status  11/05/2014 10.4* 12.0 - 15.0 g/dL Final  03/31/2014 13.3 12.2 - 16.2 g/dL Final   HCT  Date Value Ref Range Status  11/05/2014 30.6* 36.0 - 46.0 % Final   HCT, POC  Date Value Ref Range Status  03/31/2014 41.2 37.7 - 47.9 % Final    Physical Exam:  General: alert and no distress Lochia: appropriate Uterine Fundus: firm Incision: none DVT Evaluation: No evidence of DVT seen on physical exam.  Discharge Diagnoses: Term Pregnancy-delivered  Discharge Information: Date: 11/06/2014 Activity: pelvic rest Diet: routine Medications: PNV, Ibuprofen, Colace and Percocet Condition: stable Instructions: refer to practice specific booklet Discharge to: home   Newborn Data: Live born female  Birth Weight: 7 lb 8.5 oz (3416 g) APGAR: 8, 9  Home with mother.  Kim [name] A 11/06/2014, 8:18 AM

## 2014-11-06 NOTE — Progress Notes (Signed)
Post Partum Day 2 Subjective: no complaints  Objective: Blood pressure 102/55, pulse 71, temperature 98.5 F (36.9 C), temperature source Oral, resp. rate 18, height 5\' 5"  (1.651 m), weight 193 lb (87.544 kg), last menstrual period 02/04/2014, SpO2 95 %, unknown if currently breastfeeding.  Physical Exam:  General: alert and no distress Lochia: appropriate Uterine Fundus: firm Incision: none DVT Evaluation: No evidence of DVT seen on physical exam.   Recent Labs  11/04/14 0210 11/05/14 0545  HGB 11.0* 10.4*  HCT 33.1* 30.6*    Assessment/Plan: Discharge home   LOS: 2 days   Lorren Rossetti A 11/06/2014, 8:14 AM

## 2014-11-09 ENCOUNTER — Emergency Department (HOSPITAL_COMMUNITY): Payer: 59

## 2014-11-09 ENCOUNTER — Encounter (HOSPITAL_COMMUNITY): Payer: Self-pay | Admitting: *Deleted

## 2014-11-09 ENCOUNTER — Ambulatory Visit (INDEPENDENT_AMBULATORY_CARE_PROVIDER_SITE_OTHER): Payer: 59 | Admitting: Emergency Medicine

## 2014-11-09 ENCOUNTER — Ambulatory Visit (INDEPENDENT_AMBULATORY_CARE_PROVIDER_SITE_OTHER): Payer: 59

## 2014-11-09 ENCOUNTER — Inpatient Hospital Stay (HOSPITAL_COMMUNITY)
Admission: EM | Admit: 2014-11-09 | Discharge: 2014-11-10 | DRG: 781 | Disposition: A | Discharge status: Home / Self Care | Payer: 59 | Attending: Internal Medicine | Admitting: Internal Medicine

## 2014-11-09 VITALS — BP 112/78 | HR 69 | Temp 98.6°F | Resp 12 | Ht 65.5 in | Wt 185.0 lb

## 2014-11-09 DIAGNOSIS — O24419 Gestational diabetes mellitus in pregnancy, unspecified control: Secondary | ICD-10-CM

## 2014-11-09 DIAGNOSIS — I2699 Other pulmonary embolism without acute cor pulmonale: Secondary | ICD-10-CM | POA: Diagnosis present

## 2014-11-09 DIAGNOSIS — Z8759 Personal history of other complications of pregnancy, childbirth and the puerperium: Secondary | ICD-10-CM | POA: Diagnosis present

## 2014-11-09 DIAGNOSIS — R300 Dysuria: Secondary | ICD-10-CM | POA: Diagnosis present

## 2014-11-09 DIAGNOSIS — O9989 Other specified diseases and conditions complicating pregnancy, childbirth and the puerperium: Principal | ICD-10-CM | POA: Diagnosis present

## 2014-11-09 DIAGNOSIS — R0602 Shortness of breath: Secondary | ICD-10-CM

## 2014-11-09 DIAGNOSIS — Z86711 Personal history of pulmonary embolism: Secondary | ICD-10-CM | POA: Diagnosis present

## 2014-11-09 DIAGNOSIS — N390 Urinary tract infection, site not specified: Secondary | ICD-10-CM

## 2014-11-09 DIAGNOSIS — E119 Type 2 diabetes mellitus without complications: Secondary | ICD-10-CM

## 2014-11-09 DIAGNOSIS — R071 Chest pain on breathing: Secondary | ICD-10-CM

## 2014-11-09 HISTORY — DX: Personal history of pulmonary embolism: Z86.711

## 2014-11-09 HISTORY — DX: Disorder of kidney and ureter, unspecified: N28.9

## 2014-11-09 HISTORY — DX: Malignant (primary) neoplasm, unspecified: C80.1

## 2014-11-09 LAB — URINE MICROSCOPIC-ADD ON

## 2014-11-09 LAB — URINALYSIS, ROUTINE W REFLEX MICROSCOPIC
Glucose, UA: NEGATIVE mg/dL
Ketones, ur: 40 mg/dL — AB
Nitrite: POSITIVE — AB
Protein, ur: >300 mg/dL — AB
Specific Gravity, Urine: 1.027 (ref 1.005–1.030)
Urobilinogen, UA: 1 mg/dL (ref 0.0–1.0)
pH: 5 (ref 5.0–8.0)

## 2014-11-09 LAB — POCT CBC
Granulocyte percent: 67.5 %G (ref 37–80)
HCT, POC: 36.1 % — AB (ref 37.7–47.9)
Hemoglobin: 12 g/dL — AB (ref 12.2–16.2)
Lymph, poc: 1.7 (ref 0.6–3.4)
MCH, POC: 29 pg (ref 27–31.2)
MCHC: 33.2 g/dL (ref 31.8–35.4)
MCV: 87.4 fL (ref 80–97)
MID (cbc): 0.4 (ref 0–0.9)
MPV: 7.2 fL (ref 0–99.8)
POC Granulocyte: 4.3 (ref 2–6.9)
POC LYMPH PERCENT: 26.3 %L (ref 10–50)
POC MID %: 6.2 %M (ref 0–12)
Platelet Count, POC: 203 10*3/uL (ref 142–424)
RBC: 4.13 M/uL (ref 4.04–5.48)
RDW, POC: 14.9 %
WBC: 6.4 10*3/uL (ref 4.6–10.2)

## 2014-11-09 LAB — HEPATIC FUNCTION PANEL
ALT: 27 U/L (ref 14–54)
AST: 26 U/L (ref 15–41)
Albumin: 2.6 g/dL — ABNORMAL LOW (ref 3.5–5.0)
Alkaline Phosphatase: 157 U/L — ABNORMAL HIGH (ref 38–126)
Bilirubin, Direct: <0.1 mg/dL — ABNORMAL LOW (ref 0.1–0.5)
Total Bilirubin: 0.3 mg/dL (ref 0.3–1.2)
Total Protein: 6.2 g/dL — ABNORMAL LOW (ref 6.5–8.1)

## 2014-11-09 LAB — CBC
HCT: 36.6 % (ref 36.0–46.0)
Hemoglobin: 12.2 g/dL (ref 12.0–15.0)
MCH: 30 pg (ref 26.0–34.0)
MCHC: 33.3 g/dL (ref 30.0–36.0)
MCV: 90.1 fL (ref 78.0–100.0)
Platelets: 190 10*3/uL (ref 150–400)
RBC: 4.06 MIL/uL (ref 3.87–5.11)
RDW: 15.1 % (ref 11.5–15.5)
WBC: 6.3 10*3/uL (ref 4.0–10.5)

## 2014-11-09 LAB — BRAIN NATRIURETIC PEPTIDE: B Natriuretic Peptide: 156.9 pg/mL — ABNORMAL HIGH (ref 0.0–100.0)

## 2014-11-09 LAB — GLUCOSE, POCT (MANUAL RESULT ENTRY): POC Glucose: 95 mg/dl (ref 70–99)

## 2014-11-09 LAB — PROTIME-INR
INR: 1.04 (ref 0.00–1.49)
Prothrombin Time: 13.8 seconds (ref 11.6–15.2)

## 2014-11-09 LAB — I-STAT TROPONIN, ED: Troponin i, poc: 0 ng/mL (ref 0.00–0.08)

## 2014-11-09 LAB — BASIC METABOLIC PANEL
Anion gap: 8 (ref 5–15)
BUN: 12 mg/dL (ref 6–20)
CO2: 21 mmol/L — ABNORMAL LOW (ref 22–32)
Calcium: 8.5 mg/dL — ABNORMAL LOW (ref 8.9–10.3)
Chloride: 112 mmol/L — ABNORMAL HIGH (ref 101–111)
Creatinine, Ser: 0.41 mg/dL — ABNORMAL LOW (ref 0.44–1.00)
GFR calc Af Amer: >60 mL/min (ref >60)
GFR calc non Af Amer: >60 mL/min (ref >60)
Glucose, Bld: 100 mg/dL — ABNORMAL HIGH (ref 65–99)
Potassium: 4 mmol/L (ref 3.5–5.1)
Sodium: 141 mmol/L (ref 135–145)

## 2014-11-09 LAB — LIPASE, BLOOD: Lipase: 16 U/L — ABNORMAL LOW (ref 22–51)

## 2014-11-09 MED ORDER — IBUPROFEN 200 MG PO TABS
400.0000 mg | ORAL_TABLET | Freq: Four times a day (QID) | ORAL | Status: DC | PRN
  Administered 2014-11-09 – 2014-11-10 (×3): 400 mg via ORAL
  Filled 2014-11-09 (×3): qty 2

## 2014-11-09 MED ORDER — COUMADIN BOOK
Freq: Once | Status: DC
  Filled 2014-11-09: qty 1

## 2014-11-09 MED ORDER — ACETAMINOPHEN 650 MG RE SUPP: 650.0000 mg | RECTAL | Status: DC | PRN

## 2014-11-09 MED ORDER — WARFARIN - PHARMACIST DOSING INPATIENT
Freq: Every day | Status: DC
  Administered 2014-11-10: 18:00:00

## 2014-11-09 MED ORDER — SODIUM CHLORIDE 0.9 % IJ SOLN
3.0000 mL | Freq: Two times a day (BID) | INTRAMUSCULAR | Status: DC
  Administered 2014-11-09 – 2014-11-10 (×2): 3 mL via INTRAVENOUS

## 2014-11-09 MED ORDER — WARFARIN VIDEO: Freq: Once | Status: DC

## 2014-11-09 MED ORDER — ACETAMINOPHEN 325 MG PO TABS
650.0000 mg | ORAL_TABLET | Freq: Four times a day (QID) | ORAL | Status: DC | PRN
  Administered 2014-11-09: 650 mg via ORAL
  Filled 2014-11-09: qty 2

## 2014-11-09 MED ORDER — ENOXAPARIN SODIUM 100 MG/ML ~~LOC~~ SOLN
1.0000 mg/kg | Freq: Two times a day (BID) | SUBCUTANEOUS | Status: DC
  Administered 2014-11-09 – 2014-11-10 (×2): 85 mg via SUBCUTANEOUS
  Filled 2014-11-09 (×2): qty 1

## 2014-11-09 MED ORDER — MORPHINE SULFATE 4 MG/ML IJ SOLN
4.0000 mg | Freq: Once | INTRAMUSCULAR | Status: AC
  Administered 2014-11-09: 4 mg via INTRAVENOUS
  Filled 2014-11-09: qty 1

## 2014-11-09 MED ORDER — POLYETHYLENE GLYCOL 3350 17 G PO PACK
17.0000 g | PACK | Freq: Two times a day (BID) | ORAL | Status: DC | PRN
  Administered 2014-11-10: 17 g via ORAL
  Filled 2014-11-09: qty 1

## 2014-11-09 MED ORDER — SODIUM CHLORIDE 0.9 % IV BOLUS (SEPSIS)
1000.0000 mL | Freq: Once | INTRAVENOUS | Status: AC
  Administered 2014-11-09: 1000 mL via INTRAVENOUS

## 2014-11-09 MED ORDER — IOHEXOL 350 MG/ML SOLN
75.0000 mL | Freq: Once | INTRAVENOUS | Status: AC | PRN
Start: 2014-11-09 — End: 2014-11-09
  Administered 2014-11-09: 75 mL via INTRAVENOUS

## 2014-11-09 MED ORDER — PRENATAL MULTIVITAMIN CH
1.0000 | ORAL_TABLET | Freq: Every day | ORAL | Status: DC
  Administered 2014-11-09 – 2014-11-10 (×2): 1 via ORAL
  Filled 2014-11-09 (×2): qty 1

## 2014-11-09 MED ORDER — WARFARIN SODIUM 7.5 MG PO TABS
7.5000 mg | ORAL_TABLET | Freq: Once | ORAL | Status: AC
Start: 2014-11-09 — End: 2014-11-09
  Administered 2014-11-09: 7.5 mg via ORAL
  Filled 2014-11-09: qty 1

## 2014-11-09 MED ORDER — DOCUSATE SODIUM 100 MG PO CAPS
100.0000 mg | ORAL_CAPSULE | Freq: Every day | ORAL | Status: DC
  Administered 2014-11-09 – 2014-11-10 (×2): 100 mg via ORAL
  Filled 2014-11-09 (×2): qty 1

## 2014-11-09 MED ORDER — FAMOTIDINE 20 MG PO TABS
10.0000 mg | ORAL_TABLET | Freq: Every day | ORAL | Status: DC
  Administered 2014-11-09 – 2014-11-10 (×2): 10 mg via ORAL
  Filled 2014-11-09 (×2): qty 1

## 2014-11-09 MED ORDER — ACETAMINOPHEN 325 MG PO TABS
650.0000 mg | ORAL_TABLET | ORAL | Status: DC | PRN
  Administered 2014-11-10: 650 mg via ORAL
  Filled 2014-11-09: qty 2

## 2014-11-09 MED ORDER — ACETAMINOPHEN 650 MG RE SUPP: 650.0000 mg | Freq: Four times a day (QID) | RECTAL | Status: DC | PRN

## 2014-11-09 MED ORDER — ENOXAPARIN SODIUM 100 MG/ML ~~LOC~~ SOLN
83.0000 mg | Freq: Once | SUBCUTANEOUS | Status: AC
  Administered 2014-11-09: 85 mg via SUBCUTANEOUS
  Filled 2014-11-09: qty 1

## 2014-11-09 MED ORDER — CEPHALEXIN 500 MG PO CAPS
500.0000 mg | ORAL_CAPSULE | Freq: Two times a day (BID) | ORAL | Status: DC
  Administered 2014-11-09 – 2014-11-10 (×2): 500 mg via ORAL
  Filled 2014-11-09 (×2): qty 1

## 2014-11-09 NOTE — ED Notes (Signed)
Attempted report 

## 2014-11-09 NOTE — Progress Notes (Addendum)
Patient ID: [name] Kim [name], female   DOB: Sep 27, 1970, 44 y.o.   MRN: [id number]    This chart was scribed for Kim Jordan, MD by [facility], medical scribe at [facility] [place].The patient was seen in exam room 10 and the patient's care was started at 8:37 AM.  Chief Complaint:  Chief Complaint  Patient presents with  . Respiratory Distress    pain with breathing x 2 days - posterior lung bases.  Live birth 5 days ago.  Marland Kitchen Headache    x 4 days  . Nasal Congestion    x 4 days   HPI: [facility] Kim [name] is a 43 y.o. female who reports to [facility] today complaining of pain with breathing on both sides for the past two days. The pain is worse at night. No history of pneumonia. She was diagnosed with asthma because of a frequent cough after delivering her previous child 7 years ago. She was seen in the ED for the asthma. This has improved since. She does not smoke. No fever.  She also has a headache and a nasal congestion for the past four days.  Pt has pain in his both legs with associated leg swelling which began two days ago which are sore to the touch. She is wearing compression socks given to her by the OB GYN.  Pt delivered her fifth child on Tuesday and went home on Thursday. Baby is healthy breat and bottle feeding. Normal vaginal delivery.  Past Medical History  Diagnosis Date  . 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 Asthma     when pregnant/ once 6 yrs ago only time asthma attack the dr said  . Gestational diabetes    Past Surgical History  Procedure Laterality Date  . No past surgeries     History   Social History  . Marital Status: Married    Spouse Name: [name]  . Number of Children: [name]  . Years of Education: [name]   Social History Main Topics  . Smoking status: Never Smoker   . Smokeless tobacco: Never Used  . Alcohol Use: Yes     Comment: RARE  . Drug Use: No  . Sexual Activity:   Partners: Male    Birth Control/ Protection: Condom   Other Topics Concern  . None   Social History Narrative   Married with four children. Came to [place] from [place] in 2001. Unemployed currently. Spanish speaking.   Family History  Problem Relation Age of Onset  . Diabetes Mother   . Hypertension Mother   . Hyperlipidemia Father   . Diabetes Father   . Hypertension Father   . Heart disease Sister   . Diabetes Brother   . Hyperlipidemia Brother   . Drug abuse Paternal Grandmother   . Kidney disease Other   . Birth defects Other     anal atresia/stenosis   No Known Allergies Prior to Admission medications   Medication Sig Start Date End Date Taking? Authorizing Provider  ibuprofen (ADVIL,MOTRIN) 600 MG tablet Take 1 tablet (600 mg total) by mouth every 6 (six) hours as needed for mild pain. 11/06/14  Yes [name], MD  oxyCODONE-acetaminophen (PERCOCET/ROXICET) 5-325 MG per tablet Take 2 tablets by mouth every 4 (four) hours as needed for moderate pain or severe pain (for pain scale greater than 7). 11/06/14  Yes [name], MD  ROS: The patient denies fevers, chills, night sweats, unintentional weight loss, chest pain, palpitations, wheezing, nausea, vomiting, abdominal pain, dysuria, hematuria, melena, numbness, weakness, or tingling.   All other systems have been reviewed and were otherwise negative with the exception of those mentioned in the HPI and as above.    PHYSICAL EXAM: Filed Vitals:   11/09/14 0822  BP: 112/78  Pulse: 69  Temp: 98.6 F (37 C)  Resp: 12   Body mass index is 30.31 kg/(m^2).  General: Alert, no acute distress HEENT:  Normocephalic, atraumatic, oropharynx patent. Eye: Juliette Mangle [facility] Cardiovascular:  Regular rate and rhythm, no rubs murmurs or gallops.  No Carotid bruits, radial pulse intact. No pedal edema.   Respiratory: Rubs in both bases but good air movement. Abdominal: Tender suprapubic areae with fundel  enlargement Musculoskeletal: Gait intact. No edema, tenderness. Swelling with both calves with tenderness left greater than right. Skin: No rashes. Neurologic: Facial musculature symmetric. Psychiatric: Patient acts appropriately throughout our interaction. Lymphatic: No cervical or submandibular lymphadenopathy Genitourinary/Anorectal: No acute findings  LABS: Results for orders placed or performed during the hospital encounter of 11/04/14  OB RESULT CONSOLE Group B Strep  Result Value Ref Range   GBS Positive   OB RESULTS CONSOLE GC/Chlamydia  Result Value Ref Range   Gonorrhea Negative    Chlamydia Negative   CBC  Result Value Ref Range   WBC 7.0 4.0 - 10.5 K/uL   RBC 3.70 (L) 3.87 - 5.11 MIL/uL   Hemoglobin 11.0 (L) 12.0 - 15.0 g/dL   HCT 33.1 (L) 36.0 - 46.0 %   MCV 89.5 78.0 - 100.0 fL   MCH 29.7 26.0 - 34.0 pg   MCHC 33.2 30.0 - 36.0 g/dL   RDW 15.2 11.5 - 15.5 %   Platelets 177 150 - 400 K/uL  RPR  Result Value Ref Range   RPR Ser Ql Non Reactive Non Reactive  Glucose, capillary  Result Value Ref Range   Glucose-Capillary 66 65 - 99 mg/dL  CBC  Result Value Ref Range   WBC 7.6 4.0 - 10.5 K/uL   RBC 3.41 (L) 3.87 - 5.11 MIL/uL   Hemoglobin 10.4 (L) 12.0 - 15.0 g/dL   HCT 30.6 (L) 36.0 - 46.0 %   MCV 89.7 78.0 - 100.0 fL   MCH 30.5 26.0 - 34.0 pg   MCHC 34.0 30.0 - 36.0 g/dL   RDW 15.4 11.5 - 15.5 %   Platelets 146 (L) 150 - 400 K/uL  Type and screen  Result Value Ref Range   ABO/RH(D) O POS    Antibody Screen NEG    Sample Expiration 11/07/2014   ABO/Rh  Result Value Ref Range   ABO/RH(D) O POS    Results for orders placed or performed in visit on 11/09/14  POCT glucose (manual entry)  Result Value Ref Range   POC Glucose 95 70 - 99 mg/dl  POCT CBC  Result Value Ref Range   WBC 6.4 4.6 - 10.2 K/uL   Lymph, poc 1.7 0.6 - 3.4   POC LYMPH PERCENT 26.3 10 - 50 %L   MID (cbc) 0.4 0 - 0.9   POC MID % 6.2 0 - 12 %M   POC Granulocyte 4.3 2 - 6.9    Granulocyte percent 67.5 37 - 80 %G   RBC 4.13 4.04 - 5.48 M/uL   Hemoglobin 12.0 (A) 12.2 - 16.2 g/dL   HCT, POC 36.1 (A) 37.7 - 47.9 %   MCV 87.4 80 -  97 fL   MCH, POC 29.0 27 - 31.2 pg   MCHC 33.2 31.8 - 35.4 g/dL   RDW, POC 14.9 %   Platelet Count, POC 203 142 - 424 K/uL   MPV 7.2 0 - 99.8 fL    EKG/XRAY:   Primary read interpreted by Dr. Everlene Farrier at [facility] on one view there appears to be a radiolucent line suspicious for a loculated pneumothorax. On the repeated x-ray I could not demonstrate this area   ASSESSMENT/PLAN: Patient presents with bilateral pleuritic chest pain 5 days status post delivery. On one view of the chest there is an abnormal area of radiolucency suspicious for pneumo. She does have bilateral tender calves with pleuritic-type pain and I'm concerned about the possibility of DVTs and PE.Burnis Medin transfer via EMS for further evaluation.   Gross sideeffects, risk and benefits, and alternatives of medications d/w patient. Patient is aware that all medications have potential sideeffects and we are unable to predict every sideeffect or drug-drug interaction that may occur.    Arlyss Queen MD 11/09/2014 8:37 AM

## 2014-11-09 NOTE — H&P (Signed)
Date: 11/09/2014               Patient Name:  Kim [name] MRN: [id number]  DOB: [birth year] Age / Sex: 44 y.o., female   PCP: Leandrew Koyanagi, MD         Medical Service: Internal Medicine Teaching Service         Attending Physician: Dr. Gareth Morgan, MD    First Contact: Dr. Maryellen Pile Pager: [phone]  Second Contact: Dr. Jenetta Downer Pager: [phone]       After Hours (After 5p/  First Contact Pager: [phone]  weekends / holidays): Second Contact Pager: [phone]   Chief Complaint: SOB  History of Present Illness: [facility] Kim [name] is a 44 year old female with a PMH of gestational diabetes, recent vaginal delivery presents to [facility] ED with 2 day history of SOB. Patient only speaks [place], niece at bedside provided translation. She reports that 5 days ago she had a normal vaginal delivery. After the delivery she noticed bilateral leg edema. She soon developed bilateral leg pain, the edema and pain became increasingly worse in her right leg and tender to touch. She has been wearing compression socks to control the edema. Two days ago she began having SOB and bilateral back pain and substernal pain with inspiration. The pain is worse with exertion and when lying down flat. She has never had any history of DVTs and there is no family history of clotting disorders. Denies any N/V/D, fever, chills.   Does report dull frontal headache for the past two days but has improved with pain medication. Reports increased frequency and burning with urination. Initially after delivery she was having a lot of vaginal bleeding, requiring 2 pads at once. The bleeding has slowed down some over the past few days, now only requiring one pad at a time but she reports that the pad is soaked through when she changes it several times a day.   CTA done in the ED showed right sided PE. She has been bradycardic in the 40-50s but has remained hemodynamically stable. Sating upper 90s on RA.    Meds: No current facility-administered medications for this encounter.   Current Outpatient Prescriptions  Medication Sig Dispense Refill  . ibuprofen (ADVIL,MOTRIN) 600 MG tablet Take 1 tablet (600 mg total) by mouth every 6 (six) hours as needed for mild pain. 30 tablet 5  . OVER THE COUNTER MEDICATION Apply 1 spray topically daily as needed (wounds). OTC antibacterial spray for wounds    . oxyCODONE-acetaminophen (PERCOCET/ROXICET) 5-325 MG per tablet Take 2 tablets by mouth every 4 (four) hours as needed for moderate pain or severe pain (for pain scale greater than 7). 40 tablet 0  . Prenatal Vit-Fe Fumarate-FA (PNV PRENATAL PLUS MULTIVITAMIN) 27-1 MG TABS Take 1 tablet by mouth daily.    . ranitidine (ZANTAC) 150 MG tablet Take 150 mg by mouth 2 (two) times daily as needed for heartburn.      Allergies: Allergies as of 11/09/2014  . (No Known Allergies)   Past Medical History  Diagnosis Date  . 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 Asthma     when pregnant/ once 6 yrs ago only time asthma attack the dr said  . Gestational diabetes   . Cancer   . Renal insufficiency    Past Surgical History  Procedure Laterality Date  . No past surgeries  Family History  Problem Relation Age of Onset  . Diabetes Mother   . Hypertension Mother   . Hyperlipidemia Father   . Diabetes Father   . Hypertension Father   . Heart disease Sister   . Diabetes Brother   . Hyperlipidemia Brother   . Drug abuse Paternal Grandmother   . Kidney disease Other   . Birth defects Other     anal atresia/stenosis   History   Social History  . Marital Status: Married    Spouse Name: [name]  . Number of Children: [name]  . Years of Education: [name]   Occupational History  . Not on file.   Social History Main Topics  . Smoking status: Never Smoker   . Smokeless tobacco: Never Used  . Alcohol Use: Yes     Comment: RARE  . Drug Use: No  . Sexual  Activity:    Partners: Male    Birth Control/ Protection: Condom   Other Topics Concern  . Not on file   Social History Narrative   Married with four children. Came to [place] from [place] in 2001. Unemployed currently. Spanish speaking.    Review of Systems: Review of Systems  Constitutional: Negative for fever and chills.  Eyes: Negative for blurred vision.  Respiratory: Positive for shortness of breath. Negative for cough.   Cardiovascular: Negative for chest pain and palpitations.  Gastrointestinal: Positive for abdominal pain. Negative for nausea, vomiting, diarrhea, constipation and blood in stool.  Genitourinary: Positive for dysuria and frequency.  Skin: Negative for rash.  Neurological: Positive for headaches. Negative for dizziness.   Physical Exam: Blood pressure 137/69, pulse 48, resp. rate 16, SpO2 100 %, unknown if currently breastfeeding.  GENERAL- alert, co-operative, appears as stated age, not in any distress. HEENT- Atraumatic, normocephalic, PERRL, EOMI, oral mucosa appears moist, good and intact dentition. No carotid bruit, no cervical LN enlargement, thyroid does not appear enlarged, neck supple. CARDIAC- bradycardic, regular rhythm, no murmurs, rubs or gallops. RESP- Moving equal volumes of air, and clear to auscultation bilaterally, no wheezes or crackles. ABDOMEN- Soft, tender to palpation in epigastric region, no guarding or rebound, no palpable masses or organomegaly, bowel sounds present. BACK- Normal curvature of the spine, No tenderness along the vertebrae, CVA tenderness on left. NEURO- No obvious neuro deficits EXTREMITIES- pulse 2+, symmetric, bilateral pedal edema, R>L, tender to palpation on right SKIN- Warm, dry, No rash or lesion. PSYCH- Normal mood and affect, appropriate thought content and speech.  Lab results: Basic Metabolic Panel:  Recent Labs  11/09/14 1048  NA 141  K 4.0  CL 112*  CO2 21*  GLUCOSE 100*  BUN 12  CREATININE  0.41*  CALCIUM 8.5*   Liver Function Tests:  Recent Labs  11/09/14 1048  AST 26  ALT 27  ALKPHOS 157*  BILITOT 0.3  PROT 6.2*  ALBUMIN 2.6*    Recent Labs  11/09/14 1048  LIPASE 16*   CBC:  Recent Labs  11/09/14 0904 11/09/14 1048  WBC 6.4 6.3  HGB 12.0* 12.2  HCT 36.1* 36.6  MCV 87.4 90.1  PLT  --  190   Coagulation:  Recent Labs  11/09/14 1100  LABPROT 13.8  INR 1.04   Urine Drug Screen: Drugs of Abuse     Component Value Date/Time   LABOPIA NEG 04/29/2014 1516   COCAINSCRNUR NEG 04/29/2014 1516   LABBENZ NEG 04/29/2014 1516   AMPHETMU NEG 04/29/2014 1516   THCU NEG 04/29/2014 1516   LABBARB NEG 04/29/2014  1516    Imaging results:  Dg Chest 2 View  11/09/2014   CLINICAL DATA:  Chest pain on breathing, located on the right side for 2 days.  EXAM: CHEST  2 VIEW  COMPARISON:  None.  FINDINGS: The cardiomediastinal silhouette is within normal limits. The lungs are well inflated without evidence of airspace consolidation, edema, or pleural effusion. Linear lucency projecting in the lateral right midlung on the first PA radiograph likely represents a skin fold and does not persist on a repeat PA radiograph. No acute osseous abnormality is seen.  IMPRESSION: No active cardiopulmonary disease. Skin fold over the lateral right midlung.   Electronically Signed   By: Logan Bores   On: 11/09/2014 10:21   Ct Angio Chest Pe W/cm &/or Wo Cm  11/09/2014   CLINICAL DATA:  Left side back pain with deep inspiration. Shortness of breath. The patient has 5 days postpartum.  EXAM: CT ANGIOGRAPHY CHEST WITH CONTRAST  TECHNIQUE: Multidetector CT imaging of the chest was performed using the standard protocol during bolus administration of intravenous contrast. Multiplanar CT image reconstructions and MIPs were obtained to evaluate the vascular anatomy.  CONTRAST:  75 mL OMNIPAQUE IOHEXOL 350 MG/ML SOLN  COMPARISON:  None.  FINDINGS: Pulmonary embolus is seen in an artery to the  posterior right lower lobe. No other embolus is identified. There is no evidence of right heart strain. There is no pleural or pericardial effusion. No axillary, hilar or mediastinal lymphadenopathy is identified. The lungs show only mild dependent atelectasis. No infarct or consolidative process is identified.  Incidentally imaged upper abdomen demonstrates no focal abnormality. No focal bony abnormality is identified.  Review of the MIP images confirms the above findings.  IMPRESSION: The study is positive for pulmonary embolus to the posterior right lower lobe. Negative for right heart strain or pulmonary infarct.  Critical Value/emergent results were called by telephone at the time of interpretation on 11/09/2014 at 12:26 pm to Dr. Gareth Morgan , who verbally acknowledged these results.   Electronically Signed   By: Inge Rise M.D.   On: 11/09/2014 12:26    Other results: EKG: unchanged from previous tracings, sinus bradycardia.  Assessment & Plan by Problem: Principal Problem:   PE (pulmonary embolism) Active Problems:   Gestational diabetes   Dysuria  Pulmonary Embolism: Patient 5 days postpartum. Has had bilateral leg edema and pain since delivery, worsening symptoms on right leg. 2 day history of pleuritic chest pain and SOB. CTA shows PE in posterior RLL.  - Telemetry  - Continuous pulse ox - Warfarin and Lovenox per pharmacy - Acetaminophen 650 mg q6hr prn pain  Dysuria: Patient reporting increased frequency and burning with urination for past several days.  - Will check U/A with culture reflex  Gestational DM: Patient was on glyburide during pregnancy with BG well controlled. Reports she has stopped taking medications since her delivery. Reports BG has been 80s before eating and 110-120 postprandial.  - Will check blood sugars qAC  DVT PPX: per pharmacy  Code Status: Full Code  Dispo: Disposition is deferred at this time, awaiting improvement of current medical  problems. Anticipated discharge in approximately 1-2 day(s).   The patient does have a current PCP Leandrew Koyanagi, MD) and does need an [facility] hospital follow-up appointment after discharge.  The patient does not have transportation limitations that hinder transportation to clinic appointments.  Signed: Maryellen Pile, MD 11/09/2014, 4:08 PM

## 2014-11-09 NOTE — Progress Notes (Signed)
ANTICOAGULATION CONSULT NOTE - Initial Consult  Pharmacy Consult for Warfarin and Lovenox Indication: pulmonary embolus  No Known Allergies  Patient Measurements:   Heparin Dosing Weight:   Vital Signs: Temp: 98.8 F (37.1 C) (07/24 1807) Temp Source: Oral (07/24 1807) BP: 143/79 mmHg (07/24 1807) Pulse Rate: 45 (07/24 1807)  Labs:  Recent Labs  11/09/14 0904 11/09/14 1048 11/09/14 1100  HGB 12.0* 12.2  --   HCT 36.1* 36.6  --   PLT  --  190  --   LABPROT  --   --  13.8  INR  --   --  1.04  CREATININE  --  0.41*  --     Estimated Creatinine Clearance: 98.1 mL/min (by C-G formula based on Cr of 0.41).   Medical History: Past Medical History  Diagnosis Date  . 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 Asthma     when pregnant/ once 6 yrs ago only time asthma attack the dr said  . Gestational diabetes   . Cancer   . Renal insufficiency     Medications:  Prescriptions prior to admission  Medication Sig Dispense Refill Last Dose  . ibuprofen (ADVIL,MOTRIN) 600 MG tablet Take 1 tablet (600 mg total) by mouth every 6 (six) hours as needed for mild pain. 30 tablet 5 11/08/2014 at Unknown time  . OVER THE COUNTER MEDICATION Apply 1 spray topically daily as needed (wounds). OTC antibacterial spray for wounds   Past Week at Unknown time  . oxyCODONE-acetaminophen (PERCOCET/ROXICET) 5-325 MG per tablet Take 2 tablets by mouth every 4 (four) hours as needed for moderate pain or severe pain (for pain scale greater than 7). 40 tablet 0 11/08/2014 at Unknown time  . Prenatal Vit-Fe Fumarate-FA (PNV PRENATAL PLUS MULTIVITAMIN) 27-1 MG TABS Take 1 tablet by mouth daily.   11/08/2014 at Unknown time  . ranitidine (ZANTAC) 150 MG tablet Take 150 mg by mouth 2 (two) times daily as needed for heartburn.   11/08/2014 at Unknown time   Scheduled:  . PNV PRENATAL PLUS MULTIVITAMIN  1 tablet Oral Daily  . sodium chloride  3 mL Intravenous  Q12H   Infusions:    Assessment: 44yo female with history of asthma presents with SOB. Pharmacy is consulted to dose warfarin and lovenox for pulmonary embolus. CBC is wnl, sCr 0.4, INR 1.04.  Goal of Therapy:  INR 2-3 Monitor platelets by anticoagulation protocol: Yes   Plan:  Warfarin 7.5mg  tonight x1 Lovenox 1mg /kg q12h Daily INR/CBC Monitor s/sx of bleeding Educate patient on warfarin  Andrey Cota. Diona Foley, PharmD Clinical Pharmacist Pager [phone] 11/09/2014,6:08 PM

## 2014-11-09 NOTE — ED Provider Notes (Signed)
CSN: [id number]     Arrival date & time 11/09/14  1003 History   First MD Initiated Contact with Patient 11/09/14 1006     Chief Complaint  Patient presents with  . Shortness of Breath     (Consider location/radiation/quality/duration/timing/severity/associated sxs/prior Treatment) Patient is a 44 y.o. female presenting with back pain.  Back Pain Pain location: left sided back. Quality:  Unable to specify Radiates to:  Does not radiate Pain severity:  Severe Onset quality:  Sudden Duration:  3 days Progression:  Worsening Chronicity:  New Context comment:  Gave birth 5 days ago Relieved by:  Nothing Worsened by:  Deep breathing Ineffective treatments:  None tried Associated symptoms: chest pain and headaches   Associated symptoms: no abdominal pain, no bladder incontinence, no bowel incontinence, no dysuria, no fever, no numbness, no perianal numbness and no weakness     Past Medical History  Diagnosis Date  . 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 Asthma     when pregnant/ once 6 yrs ago only time asthma attack the dr said  . Gestational diabetes   . Cancer   . Renal insufficiency    Past Surgical History  Procedure Laterality Date  . No past surgeries     Family History  Problem Relation Age of Onset  . Diabetes Mother   . Hypertension Mother   . Hyperlipidemia Father   . Diabetes Father   . Hypertension Father   . Heart disease Sister   . Diabetes Brother   . Hyperlipidemia Brother   . Drug abuse Paternal Grandmother   . Kidney disease Other   . Birth defects Other     anal atresia/stenosis   History  Substance Use Topics  . Smoking status: Never Smoker   . Smokeless tobacco: Never Used  . Alcohol Use: Yes     Comment: RARE   OB History    Gravida Para Term Preterm AB TAB SAB Ectopic Multiple Living   5 5 5       0 4     Review of Systems  Constitutional: Negative for fever.  HENT: Negative for  sore throat.   Eyes: Negative for visual disturbance.  Respiratory: Positive for shortness of breath. Negative for cough.   Cardiovascular: Positive for chest pain.  Gastrointestinal: Negative for nausea, vomiting, abdominal pain, diarrhea, constipation and bowel incontinence.  Genitourinary: Negative for bladder incontinence, dysuria and difficulty urinating.  Musculoskeletal: Positive for back pain. Negative for neck pain.  Skin: Negative for rash.  Neurological: Positive for headaches. Negative for syncope, weakness and numbness.      Allergies  Review of patient's allergies indicates no known allergies.  Home Medications   Prior to Admission medications   Medication Sig Start Date End Date Taking? Authorizing Provider  ibuprofen (ADVIL,MOTRIN) 600 MG tablet Take 1 tablet (600 mg total) by mouth every 6 (six) hours as needed for mild pain. 11/06/14  Yes [name], MD  OVER THE COUNTER MEDICATION Apply 1 spray topically daily as needed (wounds). OTC antibacterial spray for wounds   Yes Historical Provider, MD  oxyCODONE-acetaminophen (PERCOCET/ROXICET) 5-325 MG per tablet Take 2 tablets by mouth every 4 (four) hours as needed for moderate pain or severe pain (for pain scale greater than 7). 11/06/14  Yes [name], MD  Prenatal Vit-Fe Fumarate-FA (PNV PRENATAL PLUS MULTIVITAMIN) 27-1 MG TABS Take 1 tablet by mouth daily. 10/23/14  Yes  Historical Provider, MD  ranitidine (ZANTAC) 150 MG tablet Take 150 mg by mouth 2 (two) times daily as needed for heartburn.   Yes Historical Provider, MD   BP 151/80 mmHg  Pulse 45  Resp 22  SpO2 99% Physical Exam  Constitutional: She is oriented to person, place, and time. She appears well-developed and well-nourished. No distress.  HENT:  Head: Normocephalic and atraumatic.  Eyes: Conjunctivae and EOM are normal.  Neck: Normal range of motion.  Cardiovascular: Regular rhythm, normal heart sounds and intact distal pulses.  Bradycardia  present.  Exam reveals no gallop and no friction rub.   No murmur heard. Pulmonary/Chest: Effort normal and breath sounds normal. No respiratory distress. She has no wheezes. She has no rales.  Abdominal: Soft. She exhibits no distension. There is tenderness (mild epigastric). There is no guarding.  Musculoskeletal: She exhibits no edema or tenderness.  Neurological: She is alert and oriented to person, place, and time.  Skin: Skin is warm and dry. No rash noted. She is not diaphoretic. No erythema.  Nursing note and vitals reviewed.   ED Course  Procedures (including critical care time) Labs Review Labs Reviewed  BASIC METABOLIC PANEL - Abnormal; Notable for the following:    Chloride 112 (*)    CO2 21 (*)    Glucose, Bld 100 (*)    Creatinine, Ser 0.41 (*)    Calcium 8.5 (*)    All other components within normal limits  BRAIN NATRIURETIC PEPTIDE - Abnormal; Notable for the following:    B Natriuretic Peptide 156.9 (*)    All other components within normal limits  LIPASE, BLOOD - Abnormal; Notable for the following:    Lipase 16 (*)    All other components within normal limits  HEPATIC FUNCTION PANEL - Abnormal; Notable for the following:    Total Protein 6.2 (*)    Albumin 2.6 (*)    Alkaline Phosphatase 157 (*)    Bilirubin, Direct <0.1 (*)    All other components within normal limits  CBC  PROTIME-INR  I-STAT TROPOININ, ED    Imaging Review Dg Chest 2 View  11/09/2014   CLINICAL DATA:  Chest pain on breathing, located on the right side for 2 days.  EXAM: CHEST  2 VIEW  COMPARISON:  None.  FINDINGS: The cardiomediastinal silhouette is within normal limits. The lungs are well inflated without evidence of airspace consolidation, edema, or pleural effusion. Linear lucency projecting in the lateral right midlung on the first PA radiograph likely represents a skin fold and does not persist on a repeat PA radiograph. No acute osseous abnormality is seen.  IMPRESSION: No active  cardiopulmonary disease. Skin fold over the lateral right midlung.   Electronically Signed   By: Logan Bores   On: 11/09/2014 10:21   Ct Angio Chest Pe W/cm &/or Wo Cm  11/09/2014   CLINICAL DATA:  Left side back pain with deep inspiration. Shortness of breath. The patient has 5 days postpartum.  EXAM: CT ANGIOGRAPHY CHEST WITH CONTRAST  TECHNIQUE: Multidetector CT imaging of the chest was performed using the standard protocol during bolus administration of intravenous contrast. Multiplanar CT image reconstructions and MIPs were obtained to evaluate the vascular anatomy.  CONTRAST:  75 mL OMNIPAQUE IOHEXOL 350 MG/ML SOLN  COMPARISON:  None.  FINDINGS: Pulmonary embolus is seen in an artery to the posterior right lower lobe. No other embolus is identified. There is no evidence of right heart strain. There is no pleural or pericardial  effusion. No axillary, hilar or mediastinal lymphadenopathy is identified. The lungs show only mild dependent atelectasis. No infarct or consolidative process is identified.  Incidentally imaged upper abdomen demonstrates no focal abnormality. No focal bony abnormality is identified.  Review of the MIP images confirms the above findings.  IMPRESSION: The study is positive for pulmonary embolus to the posterior right lower lobe. Negative for right heart strain or pulmonary infarct.  Critical Value/emergent results were called by telephone at the time of interpretation on 11/09/2014 at 12:26 pm to Dr. Gareth Morgan , who verbally acknowledged these results.   Electronically Signed   By: Inge Rise M.D.   On: 11/09/2014 12:26     EKG Interpretation   Date/Time:  Sunday November 09 2014 10:16:47 EDT Ventricular Rate:  48 PR Interval:  154 QRS Duration: 98 QT Interval:  453 QTC Calculation: 405 R Axis:   65 Text Interpretation:  Sinus bradycardia Similar to last tracing  Although  rate has decreased Confirmed by [facility] MD, [place] ([id number]) on 11/09/2014  11:05:05 AM       MDM   Final diagnoses:  None   44yo female 5 days postpartum presents with concern of left-sided pleuritic back pain for 3 days with shortness of breath.  DDx includes musculoskeletal, pneumonia, cardiomyopathy/CHF, PE.  XR done at urgent care within normal limits.  Doubt intraabdominal cause given benign exam.  Given pleuritic pain, dyspnea, recent postpartum in hospital feel pt is high risk for PE and CTA PE study ordered and showed right sided pulmonary embolus. Patient without signs of right heart strain on CT, normal troponin, and BNP of 156. She remains hemodynamically stable with mild sinus bradycardia.   Hospitalist and Family medicine consulted regarding admission.  Patient now to be admitted to Internal Medicine (unassigned.)      Gareth Morgan, MD 11/09/14 [phone]

## 2014-11-09 NOTE — ED Notes (Signed)
Pt arrived by [facility] from [facility]. Pt is 5 days postpartum and having sob, pain to left side of back when she takes a deep breath. Airway intact.

## 2014-11-09 NOTE — ED Notes (Signed)
Patient transported to CT 

## 2014-11-09 NOTE — ED Notes (Signed)
Internal medicine at bedside

## 2014-11-10 LAB — CBC
HCT: 34.5 % — ABNORMAL LOW (ref 36.0–46.0)
Hemoglobin: 11.5 g/dL — ABNORMAL LOW (ref 12.0–15.0)
MCH: 30.1 pg (ref 26.0–34.0)
MCHC: 33.3 g/dL (ref 30.0–36.0)
MCV: 90.3 fL (ref 78.0–100.0)
Platelets: 191 10*3/uL (ref 150–400)
RBC: 3.82 MIL/uL — ABNORMAL LOW (ref 3.87–5.11)
RDW: 15.2 % (ref 11.5–15.5)
WBC: 5.9 10*3/uL (ref 4.0–10.5)

## 2014-11-10 LAB — PROTIME-INR
INR: 1.13 (ref 0.00–1.49)
Prothrombin Time: 14.7 seconds (ref 11.6–15.2)

## 2014-11-10 MED ORDER — ENOXAPARIN SODIUM 80 MG/0.8ML ~~LOC~~ SOLN
80.0000 mg | Freq: Two times a day (BID) | SUBCUTANEOUS | Status: DC
  Administered 2014-11-10: 80 mg via SUBCUTANEOUS
  Filled 2014-11-10: qty 0.8

## 2014-11-10 MED ORDER — WARFARIN SODIUM 7.5 MG PO TABS
7.5000 mg | ORAL_TABLET | Freq: Once | ORAL | Status: AC
  Administered 2014-11-10: 7.5 mg via ORAL
  Filled 2014-11-10: qty 1

## 2014-11-10 MED ORDER — CEPHALEXIN 500 MG PO CAPS: 500.0000 mg | ORAL_CAPSULE | Freq: Two times a day (BID) | ORAL | Status: DC

## 2014-11-10 MED ORDER — ENOXAPARIN SODIUM 80 MG/0.8ML ~~LOC~~ SOLN: 1.5000 mg/kg | SUBCUTANEOUS | Status: DC

## 2014-11-10 MED ORDER — ENOXAPARIN SODIUM 80 MG/0.8ML ~~LOC~~ SOLN: 80.0000 mg | Freq: Two times a day (BID) | SUBCUTANEOUS | Status: DC

## 2014-11-10 MED ORDER — WARFARIN SODIUM 7.5 MG PO TABS: 7.5000 mg | ORAL_TABLET | Freq: Once | ORAL | Status: DC

## 2014-11-10 NOTE — Progress Notes (Signed)
Subjective: Patient reports pain is improved today, only having some tenderness in her back on the right side, where the blood clot was on CTA. SOB is improved. LE pain improved today.  Objective: Vital signs in last 24 hours: Filed Vitals:   11/09/14 1807 11/09/14 2100 11/10/14 0500 11/10/14 0800  BP: 143/79 140/74 137/73 131/73  Pulse: 45 48 52 48  Temp: 98.8 F (37.1 C) 98.2 F (36.8 C) 98.7 F (37.1 C) 97.8 F (36.6 C)  TempSrc: Oral Oral Oral Oral  Resp: 16   18  Height: 5' 5.5" (1.664 m)     Weight: 84.596 kg (186 lb 8 oz)  83.462 kg (184 lb)   SpO2: 100% 100% 100% 100%   Weight change:   Intake/Output Summary (Last 24 hours) at 11/10/14 1239 Last data filed at 11/10/14 0700  Gross per 24 hour  Intake   1760 ml  Output    101 ml  Net   1659 ml   Physical Exam GENERAL- alert, co-operative, appears as stated age, not in any distress. CARDIAC- RRR, no murmurs, rubs or gallops. RESP- Moving equal volumes of air, and clear to auscultation bilaterally, no wheezes or crackles. ABDOMEN- Soft, nontender, no guarding or rebound, bowel sounds present. EXTREMITIES- pulse 2+, symmetric, trace pedal edema. SKIN- Warm, dry, No rash or lesion. PSYCH- Normal mood and affect, appropriate thought content and speech.  Lab Results: Basic Metabolic Panel:  Recent Labs Lab 11/09/14 1048  NA 141  K 4.0  CL 112*  CO2 21*  GLUCOSE 100*  BUN 12  CREATININE 0.41*  CALCIUM 8.5*   Liver Function Tests:  Recent Labs Lab 11/09/14 1048  AST 26  ALT 27  ALKPHOS 157*  BILITOT 0.3  PROT 6.2*  ALBUMIN 2.6*    Recent Labs Lab 11/09/14 1048  LIPASE 16*   CBC:  Recent Labs Lab 11/09/14 1048 11/10/14 0316  WBC 6.3 5.9  HGB 12.2 11.5*  HCT 36.6 34.5*  MCV 90.1 90.3  PLT 190 191  CBG:  Recent Labs Lab 11/04/14 0617  GLUCAP 66   Coagulation:  Recent Labs Lab 11/09/14 1100 11/10/14 0316  LABPROT 13.8 14.7  INR 1.04 1.13   Urine Drug Screen: Drugs of  Abuse     Component Value Date/Time   LABOPIA NEG 04/29/2014 1516   COCAINSCRNUR NEG 04/29/2014 1516   LABBENZ NEG 04/29/2014 1516   AMPHETMU NEG 04/29/2014 1516   THCU NEG 04/29/2014 1516   LABBARB NEG 04/29/2014 1516    Urinalysis:  Recent Labs Lab 11/09/14 2044  COLORURINE RED*  LABSPEC 1.027  PHURINE 5.0  GLUCOSEU NEGATIVE  HGBUR LARGE*  BILIRUBINUR LARGE*  KETONESUR 40*  PROTEINUR >300*  UROBILINOGEN 1.0  NITRITE POSITIVE*  LEUKOCYTESUR LARGE*   Micro Results: No results found for this or any previous visit (from the past 240 hour(s)). Studies/Results: Dg Chest 2 View  11/09/2014   CLINICAL DATA:  Chest pain on breathing, located on the right side for 2 days.  EXAM: CHEST  2 VIEW  COMPARISON:  None.  FINDINGS: The cardiomediastinal silhouette is within normal limits. The lungs are well inflated without evidence of airspace consolidation, edema, or pleural effusion. Linear lucency projecting in the lateral right midlung on the first PA radiograph likely represents a skin fold and does not persist on a repeat PA radiograph. No acute osseous abnormality is seen.  IMPRESSION: No active cardiopulmonary disease. Skin fold over the lateral right midlung.   Electronically Signed   By: Zenia Resides  Jeralyn Ruths   On: 11/09/2014 10:21   Ct Angio Chest Pe W/cm &/or Wo Cm  11/09/2014   CLINICAL DATA:  Left side back pain with deep inspiration. Shortness of breath. The patient has 5 days postpartum.  EXAM: CT ANGIOGRAPHY CHEST WITH CONTRAST  TECHNIQUE: Multidetector CT imaging of the chest was performed using the standard protocol during bolus administration of intravenous contrast. Multiplanar CT image reconstructions and MIPs were obtained to evaluate the vascular anatomy.  CONTRAST:  75 mL OMNIPAQUE IOHEXOL 350 MG/ML SOLN  COMPARISON:  None.  FINDINGS: Pulmonary embolus is seen in an artery to the posterior right lower lobe. No other embolus is identified. There is no evidence of right heart strain.  There is no pleural or pericardial effusion. No axillary, hilar or mediastinal lymphadenopathy is identified. The lungs show only mild dependent atelectasis. No infarct or consolidative process is identified.  Incidentally imaged upper abdomen demonstrates no focal abnormality. No focal bony abnormality is identified.  Review of the MIP images confirms the above findings.  IMPRESSION: The study is positive for pulmonary embolus to the posterior right lower lobe. Negative for right heart strain or pulmonary infarct.  Critical Value/emergent results were called by telephone at the time of interpretation on 11/09/2014 at 12:26 pm to Dr. Gareth Morgan , who verbally acknowledged these results.   Electronically Signed   By: Inge Rise M.D.   On: 11/09/2014 12:26   Medications: I have reviewed the patient's current medications. Scheduled Meds: . cephALEXin  500 mg Oral Q12H  . coumadin book   Does not apply Once  . docusate sodium  100 mg Oral Daily  . enoxaparin (LOVENOX) injection  80 mg Subcutaneous Q12H  . famotidine  10 mg Oral Daily  . prenatal multivitamin  1 tablet Oral Daily  . sodium chloride  3 mL Intravenous Q12H  . warfarin  7.5 mg Oral ONCE-1800  . warfarin   Does not apply Once  . Warfarin - Pharmacist Dosing Inpatient   Does not apply q1800   Continuous Infusions:  PRN Meds:.acetaminophen **OR** acetaminophen, ibuprofen, polyethylene glycol Assessment/Plan: Principal Problem:   PE (pulmonary embolism) Active Problems:   Gestational diabetes   Dysuria   Pulmonary embolism  Pulmonary Embolism: Symptoms improved today. R thoracic back pain today only, SOB improved.  - Warfarin and Lovenox per pharmacy - Lovenox injection training - Likely D/C home with Lovenox bridge for at least 5 days - Acetaminophen 650 mg q6hr prn pain  Dysuria: Patient reporting increased frequency and burning with urination for past several days.  - U/A dirty - Stated on Keflex  Gestational DM:  Patient was on glyburide during pregnancy with BG well controlled. Reports she has stopped taking medications since her delivery. Reports BG has been 80s before eating and 110-120 postprandial.  - Blood sugars have been stable  Dispo: Discharge today  The patient does not have a current PCP (Leandrew Koyanagi, MD) and does need an [facility] hospital follow-up appointment after discharge.  The patient does not have transportation limitations that hinder transportation to clinic appointments.    LOS: 1 day   Maryellen Pile, MD 11/10/2014, 12:39 PM

## 2014-11-10 NOTE — Discharge Instructions (Signed)

## 2014-11-10 NOTE — Care Management Note (Addendum)
Case Management Note  Patient Details  Name: [facility] [name] MRN: [id number] Date of Birth: [birth year]  Subjective/Objective: Pt admitted for PE. Plan to initiate on Lovenox Injections for home.                   Action/Plan: Benefits check in process for Lovenox- CM will make pt aware once completed. No further needs from CM at this time.    Expected Discharge Date:                  Expected Discharge Plan:  Home/Self Care  In-House Referral:  NA  Discharge planning Services  CM Consult, Medication Assistance  Post Acute Care Choice:  NA Choice offered to:  NA  DME Arranged:  [name] DME Agency:  NA  HH Arranged:  NA HH Agency:  NA  Status of Service:  Completed, signed off  Medicare Important Message Given:    Date Medicare IM Given:    Medicare IM give by:    Date Additional Medicare IM Given:    Additional Medicare Important Message give by:     If discussed at [place] of Stay Meetings, dates discussed:    Additional Comments:  [place], RN,BSN [phone]  Will make pt aware of co pay cost once completed.  S/W [facility] @ [employer] # [phone]  LOVENOX 80 MG EVERY 12HRS FOR 4 DAYS 8 SYRINGES  COVER- YES  CO-PAY- $ 160.00  Max quantity of 24 per day  TIER- 4 DRUG  PRIOR APPROVAL - YES # [phone] FAX # [phone]  PHARMACY - ANY RETAIL   ENOXAPARIN 80 MG  COVER- YES  CO-PAY- $ 40.00  Max quantity of 24 per day  TIER- 2 DRUG  PRIOR APPROVAL -YES # [phone] FAX # [phone]  PHARMACY - ANY RETAIL   LOVENOX 120 MG SQ FOR 4 SYRINGES  COVER- YES  CO-PAY - $ 160.00 Max quantity of 24 per day  TIER-4 DRUG  PRIOR APPROVAL - YES  PHARMACY - ANT RETAIL   ENOXAPARIN 120 MG SQ FOR 4 SYRINGES  COVER- YES  CO-PAY-$ 40.00 max quantity of 24 per day  TIER- 2 DUG  PRIOR APPROVAL - YES  PHARMACY - ANY RETAIL  CM did call [facility] [address] and medication is not available. CM called [facility]  [address] and  medication is available. No further needs from CM at this time.  Bethena Roys, RN 11/10/2014, 11:07 AM

## 2014-11-10 NOTE — Progress Notes (Signed)
Patient complaining of abdominal pain, unable to describe but stated not cramping.  RN paged internal medicine residency service.  Dr. Viviano Simas to bedside to assess.

## 2014-11-10 NOTE — Progress Notes (Signed)
UR Completed [facility], RN,BSN [phone]  

## 2014-11-10 NOTE — Progress Notes (Addendum)
ANTICOAGULATION CONSULT NOTE - Follow Up Consult  Pharmacy Consult for warfarin and lovenox Indication: pulmonary embolus  No Known Allergies  Patient Measurements: Height: 5' 5.5" (166.4 cm) Weight: 184 lb (83.462 kg) IBW/kg (Calculated) : 58.15   Vital Signs: Temp: 97.8 F (36.6 C) (07/25 0800) Temp Source: Oral (07/25 0800) BP: 131/73 mmHg (07/25 0800) Pulse Rate: 48 (07/25 0800)  Labs:  Recent Labs  11/09/14 0904 11/09/14 1048 11/09/14 1100 11/10/14 0316  HGB 12.0* 12.2  --  11.5*  HCT 36.1* 36.6  --  34.5*  PLT  --  190  --  191  LABPROT  --   --  13.8 14.7  INR  --   --  1.04 1.13  CREATININE  --  0.41*  --   --     Estimated Creatinine Clearance: 97.8 mL/min (by C-G formula based on Cr of 0.41).   Assessment: 43yoF admitted 11/09/2014 with RLL PE s/p delivery of fifth child 7/19. CTA in ED showed right sided PE.  H/H 11.5/34.5, plt 191, INR 1.13, Scr 0.41 Pt c/o having lots of vaginal bleeding initially after delivery, and says the bleeding has since decreased. No other bleeding noted.  Goal of Therapy:  INR 2-3 Monitor platelets by anticoagulation protocol: Yes   Plan:  - Warfarin 7.5mg  x1 - Lovenox 80 mg sq q12h - Will educate on warfarin - Monitor daily INR, CBC, s/sx of bleeding  Dimitri Ped, PharmD. Clinical Pharmacist Resident Pager: [phone]

## 2014-11-12 NOTE — Discharge Summary (Signed)
Name: Kim [name] MRN: [id number] DOB: 02-07-1971 44 y.o. PCP: Leandrew Koyanagi, MD  Date of Admission: 11/09/2014 10:03 AM Date of Discharge: 11/12/2014 Attending Physician: No att. providers found  Discharge Diagnosis: Principal Problem:   PE (pulmonary embolism) Active Problems:   Gestational diabetes   Dysuria   Pulmonary embolism  Discharge Medications:   Medication List    TAKE these medications        cephALEXin 500 MG capsule  Commonly known as:  KEFLEX  Take 1 capsule (500 mg total) by mouth every 12 (twelve) hours.     enoxaparin 80 MG/0.8ML injection  Commonly known as:  LOVENOX  Inject 1.25 mLs (125 mg total) into the skin daily.     ibuprofen 600 MG tablet  Commonly known as:  ADVIL,MOTRIN  Take 1 tablet (600 mg total) by mouth every 6 (six) hours as needed for mild pain.     OVER THE COUNTER MEDICATION  Apply 1 spray topically daily as needed (wounds). OTC antibacterial spray for wounds     oxyCODONE-acetaminophen 5-325 MG per tablet  Commonly known as:  PERCOCET/ROXICET  Take 2 tablets by mouth every 4 (four) hours as needed for moderate pain or severe pain (for pain scale greater than 7).     PNV PRENATAL PLUS MULTIVITAMIN 27-1 MG Tabs  Take 1 tablet by mouth daily.     ranitidine 150 MG tablet  Commonly known as:  ZANTAC  Take 150 mg by mouth 2 (two) times daily as needed for heartburn.     warfarin 7.5 MG tablet  Commonly known as:  COUMADIN  Take 1 tablet (7.5 mg total) by mouth one time only at 6 PM.        Disposition and follow-up:   Kim [name] was discharged from [facility] in Good condition.  At the hospital follow up visit please address:  1.  Please assess INR status and talk with pharmacy if needing adjustment in warfarin dose and further lonveox bridging. Urinary symptoms improved with Keflex.   2.  Labs / imaging needed at time of follow-up: INR  3.  Pending labs/ test needing  follow-up: None  Follow-up Appointments:     Follow-up Information    Follow up with Zada Finders, MD On 11/14/2014.   Specialty:  Internal Medicine   Why:  @ 10:00 AM   Contact information:   [address] [place] [address] [phone]       Discharge Instructions: Discharge Instructions    Call MD for:  difficulty breathing, headache or visual disturbances    Complete by:  As directed      Call MD for:  severe uncontrolled pain    Complete by:  As directed      Diet - low sodium heart healthy    Complete by:  As directed      Discharge instructions    Complete by:  As directed   Please follow up in our clinic on 7/29 at 10:00 AM     Increase activity slowly    Complete by:  As directed            Consultations:    Procedures Performed:  Dg Chest 2 View  11/09/2014   CLINICAL DATA:  Chest pain on breathing, located on the right side for 2 days.  EXAM: CHEST  2 VIEW  COMPARISON:  None.  FINDINGS: The cardiomediastinal silhouette is within normal limits. The lungs are  well inflated without evidence of airspace consolidation, edema, or pleural effusion. Linear lucency projecting in the lateral right midlung on the first PA radiograph likely represents a skin fold and does not persist on a repeat PA radiograph. No acute osseous abnormality is seen.  IMPRESSION: No active cardiopulmonary disease. Skin fold over the lateral right midlung.   Electronically Signed   By: Logan Bores   On: 11/09/2014 10:21   Ct Angio Chest Pe W/cm &/or Wo Cm  11/09/2014   CLINICAL DATA:  Left side back pain with deep inspiration. Shortness of breath. The patient has 5 days postpartum.  EXAM: CT ANGIOGRAPHY CHEST WITH CONTRAST  TECHNIQUE: Multidetector CT imaging of the chest was performed using the standard protocol during bolus administration of intravenous contrast. Multiplanar CT image reconstructions and MIPs were obtained to evaluate the vascular anatomy.  CONTRAST:  75 mL OMNIPAQUE  IOHEXOL 350 MG/ML SOLN  COMPARISON:  None.  FINDINGS: Pulmonary embolus is seen in an artery to the posterior right lower lobe. No other embolus is identified. There is no evidence of right heart strain. There is no pleural or pericardial effusion. No axillary, hilar or mediastinal lymphadenopathy is identified. The lungs show only mild dependent atelectasis. No infarct or consolidative process is identified.  Incidentally imaged upper abdomen demonstrates no focal abnormality. No focal bony abnormality is identified.  Review of the MIP images confirms the above findings.  IMPRESSION: The study is positive for pulmonary embolus to the posterior right lower lobe. Negative for right heart strain or pulmonary infarct.  Critical Value/emergent results were called by telephone at the time of interpretation on 11/09/2014 at 12:26 pm to Dr. Gareth Morgan , who verbally acknowledged these results.   Electronically Signed   By: Inge Rise M.D.   On: 11/09/2014 12:26   US Ob Limited  10/28/2014   OBSTETRICAL ULTRASOUND: This exam was performed within a [facility]. The OB US report was generated in the AS system, and faxed to the ordering physician.   This report is available in the [facility]. See the AS Obstetric US report via the Image Link.  US Ob Limited  10/21/2014   OBSTETRICAL ULTRASOUND: This exam was performed within a [facility] Department. The OB US report was generated in the AS system, and faxed to the ordering physician.   This report is available in the [facility]. See the AS Obstetric US report via the Image Link.  US Ob Limited  10/14/2014   OBSTETRICAL ULTRASOUND: This exam was performed within a [facility] Department. The OB US report was generated in the AS system, and faxed to the ordering physician.   This report is available in the [facility]. See the AS Obstetric US report via the Image Link.  Admission HPI: [facility] Kim [name] is a  44 year old female with a PMH of gestational diabetes, recent vaginal delivery presents to [facility] ED with 2 day history of SOB. Patient only speaks [place], niece at bedside provided translation. She reports that 5 days ago she had a normal vaginal delivery. After the delivery she noticed bilateral leg edema. She soon developed bilateral leg pain, the edema and pain became increasingly worse in her right leg and tender to touch. She has been wearing compression socks to control the edema. Two days ago she began having SOB and bilateral back pain and substernal pain with inspiration. The pain is worse with exertion and when lying down flat. She has  never had any history of DVTs and there is no family history of clotting disorders. Denies any N/V/D, fever, chills.   Does report dull frontal headache for the past two days but has improved with pain medication. Reports increased frequency and burning with urination. Initially after delivery she was having a lot of vaginal bleeding, requiring 2 pads at once. The bleeding has slowed down some over the past few days, now only requiring one pad at a time but she reports that the pad is soaked through when she changes it several times a day.   CTA done in the ED showed right sided PE. She has been bradycardic in the 40-50s but has remained hemodynamically stable. Sating upper 90s on RA.   Hospital Course by problem list: Principal Problem:   PE (pulmonary embolism) Active Problems:   Gestational diabetes   Dysuria   Pulmonary embolism   Pulmonary Embolism: CTA in the ED was positive for pulmonary embolus to the posterior right lower lobe. We admitted the patient to the Internal Medicine Teaching Service. She was five days postpartum and breastfeeding, started her on warfarin and bridged with lovenox as it is safe to use when breastfeeding. Acetaminophen for pain. The following morning her SOB and pleuritic chest pain were improved, though still complaining of some  right back pain. She got lovenox injection training. She was discharged on lovenox for five days and warfarin with clinic follow up on 7/29. Will need close follow up for warfarin management.  Dysuria: Patient reported increased frequency and burning with urination for the past several days. U/A showed large leukocytes, numerous WBC, positive for nitrite. She was started on Keflex. Discharged with 3 day course of Keflex.   Gestational DM: Patient was on glyburide during pregnancy with BG well controlled. Reports she has stopped taking medications since her delivery. BG in the hospital were well controlled.    Discharge Vitals:   BP 131/73 mmHg  Pulse 48  Temp(Src) 97.8 F (36.6 C) (Oral)  Resp 18  Ht 5' 5.5" (1.664 m)  Wt 83.462 kg (184 lb)  BMI 30.14 kg/m2  SpO2 100%  Discharge Labs:  No results found for this or any previous visit (from the past 24 hour(s)).  Signed: Maryellen Pile, MD 11/12/2014, 6:46 PM

## 2014-11-14 ENCOUNTER — Ambulatory Visit (INDEPENDENT_AMBULATORY_CARE_PROVIDER_SITE_OTHER): Payer: 59 | Admitting: Internal Medicine

## 2014-11-14 ENCOUNTER — Encounter: Payer: Self-pay | Admitting: Internal Medicine

## 2014-11-14 VITALS — BP 124/71 | HR 52 | Temp 98.5°F | Ht 65.5 in | Wt 178.6 lb

## 2014-11-14 DIAGNOSIS — I2699 Other pulmonary embolism without acute cor pulmonale: Secondary | ICD-10-CM | POA: Diagnosis not present

## 2014-11-14 DIAGNOSIS — R300 Dysuria: Secondary | ICD-10-CM

## 2014-11-14 DIAGNOSIS — R51 Headache: Secondary | ICD-10-CM | POA: Diagnosis not present

## 2014-11-14 DIAGNOSIS — Z7901 Long term (current) use of anticoagulants: Secondary | ICD-10-CM | POA: Diagnosis not present

## 2014-11-14 DIAGNOSIS — G44209 Tension-type headache, unspecified, not intractable: Secondary | ICD-10-CM

## 2014-11-14 DIAGNOSIS — R519 Headache, unspecified: Secondary | ICD-10-CM | POA: Insufficient documentation

## 2014-11-14 LAB — POCT URINALYSIS DIPSTICK
Bilirubin, UA: NEGATIVE
Glucose, UA: NEGATIVE
Ketones, UA: NEGATIVE
Protein, UA: >=300
Spec Grav, UA: 1.02
Urobilinogen, UA: 0.2
pH, UA: 5.5

## 2014-11-14 LAB — POCT INR: INR: 2.9

## 2014-11-14 NOTE — Assessment & Plan Note (Signed)
Patient states her dysuria is improved after course of Keflex but still has some burning with urination. She also states that she noticed some hematuria and vaginal bleeding. She denies any discharge, change in urinary frequency or urgency. No fever or chills. She is nearly 2 weeks post-partum of vaginal delivery.  Urine dipstick today is negative for nitrites, but positive for leukocytes, protein, and blood. This may be residual from treated UTI or mix with vaginal bleeding. Will wait to treat until have results of urine culture.  -Check Urine culture -Follow up early next week.

## 2014-11-14 NOTE — Assessment & Plan Note (Signed)
Patient is following up after hospital visit for PE of posterior right lower lobe of lungs. She is also about 2 weeks post-partum of vaginal delivery. She was started on warfarin with Lovenox bridge. She was discharged with 5 days of lovenox, last day being today, and warfarin 7.5 g daily. She states that her chest pain/breathing pain is mostly improved but is still complaining of some right sided back pain while laying supine. She also states that she has some pain on deep inspiration.  INR today is 2.9.  -Continue warfarin 7.5 mg over the weekend and schedule appointment with anti-coagulation clinic on Monday 11/17/2014 for dose adjustment. -Follow up early next week.

## 2014-11-14 NOTE — Patient Instructions (Signed)
Thank you for your visit.  Your INR today is 2.9.  Please continue to take your warfarin 7.5 mg once daily during the weekend and schedule an appointment with the anti-coagulation clinic for Monday, 11/17/2014.  We are checking urine to see if we need to continue antibiotics.  For your headache, you may take Ibuprofen 600 mg up to three times a day as needed.  Please schedule a follow up visit with [place] early next week.

## 2014-11-14 NOTE — Progress Notes (Signed)
Patient ID: [facility] Kim [name], female   DOB: 1971/04/11, 44 y.o.   MRN: [id number]   Subjective:   Patient ID: Kim [name] female   DOB: [birth year] 44 y.o.   MRN: [id number]  HPI: Kim [name] is a 44 y.o. female with PMH as listed below who presents for follow up after hospital visit. She was admitted five days after vaginal delivery on 11/09/2014 with complaints of bilateral lower extremity edema, SOB, and back pain. She is spanish speaking and communication is done through a Optometrist.  In the ED CTA showed pulmonary embolism of the posterior right lobe of the lung. She was started on warfarin with lovenox bridge. She also had complaints of dysuria and urinalysis showed large leukocytes, numerous WBCs, and positive nitrites. She was started on Keflex. Patient was discharged on 7/27 with 5 days of lovenox, warfarin 7.5 daily, Ibuprofen 600 mg for pain prn, and 3 days course of Keflex.  Patient states that her Lovenox is completed today. She finished her course of Keflex as well. She has complaints of headache, some back pain, and states that her urinary symptoms are somewhat improved.  Please see problem list for details.    Past Medical History  Diagnosis Date  . 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 Asthma     when pregnant/ once 6 yrs ago only time asthma attack the dr said  . Gestational diabetes   . Cancer   . Renal insufficiency    Current Outpatient Prescriptions  Medication Sig Dispense Refill  . cephALEXin (KEFLEX) 500 MG capsule Take 1 capsule (500 mg total) by mouth every 12 (twelve) hours. 5 capsule 0  . enoxaparin (LOVENOX) 80 MG/0.8ML injection Inject 1.25 mLs (125 mg total) into the skin daily. 4 Syringe 0  . ibuprofen (ADVIL,MOTRIN) 600 MG tablet Take 1 tablet (600 mg total) by mouth every 6 (six) hours as needed for mild pain. 30 tablet 5  . OVER THE COUNTER MEDICATION Apply 1 spray  topically daily as needed (wounds). OTC antibacterial spray for wounds    . oxyCODONE-acetaminophen (PERCOCET/ROXICET) 5-325 MG per tablet Take 2 tablets by mouth every 4 (four) hours as needed for moderate pain or severe pain (for pain scale greater than 7). 40 tablet 0  . Prenatal Vit-Fe Fumarate-FA (PNV PRENATAL PLUS MULTIVITAMIN) 27-1 MG TABS Take 1 tablet by mouth daily.    . ranitidine (ZANTAC) 150 MG tablet Take 150 mg by mouth 2 (two) times daily as needed for heartburn.    . warfarin (COUMADIN) 7.5 MG tablet Take 1 tablet (7.5 mg total) by mouth one time only at 6 PM. 30 tablet 0   No current facility-administered medications for this visit.   Family History  Problem Relation Age of Onset  . Diabetes Mother   . Hypertension Mother   . Hyperlipidemia Father   . Diabetes Father   . Hypertension Father   . Heart disease Sister   . Diabetes Brother   . Hyperlipidemia Brother   . Drug abuse Paternal Grandmother   . Kidney disease Other   . Birth defects Other     anal atresia/stenosis   History   Social History  . Marital Status: Married    Spouse Name: [name]  . Number of Children: [name]  . Years of Education: [name]   Social History Main Topics  . Smoking status: Never Smoker   . Smokeless  tobacco: Never Used  . Alcohol Use: 0.0 oz/week    0 Standard drinks or equivalent per week     Comment: RARE  . Drug Use: No  . Sexual Activity:    Partners: Male    Birth Control/ Protection: Condom   Other Topics Concern  . None   Social History Narrative   Married with four children. Came to [place] from [place] in 2001. Unemployed currently. Spanish speaking.   Review of Systems: Review of Systems  Constitutional: Negative for fever.  Eyes: Negative for blurred vision, photophobia and pain.  Respiratory: Negative for cough and wheezing.        She has pain on when taking deep breaths.  Cardiovascular: Negative for chest pain.       Swelling of the right foot.    Gastrointestinal: Positive for constipation. Negative for nausea, vomiting and diarrhea.  Genitourinary: Positive for hematuria.       Positive for vaginal bleeding.  Musculoskeletal: Positive for back pain.  Neurological: Positive for headaches. Negative for dizziness.    Objective:  Physical Exam: Filed Vitals:   11/14/14 1021  BP: 124/71  Pulse: 52  Temp: 98.5 F (36.9 C)  TempSrc: Oral  Height: 5' 5.5" (1.664 m)  Weight: 178 lb 9.6 oz (81.012 kg)  SpO2: 99%   Physical Exam  Constitutional: She is oriented to person, place, and time. She appears well-developed and well-nourished.  HENT:  Head: Normocephalic and atraumatic.  Eyes: Conjunctivae and EOM are normal. Pupils are equal, round, and reactive to light.  Neck: Normal range of motion. Neck supple.  Cardiovascular: Regular rhythm.  Bradycardia present.   Pulmonary/Chest: Effort normal and breath sounds normal. No respiratory distress. She has no wheezes. She exhibits no tenderness.  Musculoskeletal: She exhibits no tenderness.  Slight swelling of right foot.  Neurological: She is alert and oriented to person, place, and time.  Skin: Skin is warm.    Assessment & Plan:  Please see problem based charting for assessment and plan.

## 2014-11-14 NOTE — Assessment & Plan Note (Signed)
Patient is complaining of headache that started in the last 7-10 days. She describes it as a throbbing/pulsating headache that is bilateral located at the back of her head, top of head, and at the temples. She associates it with a throbbing sensation of the eyes when it is at it's most painful. She is unaware of any antagonizing factors whether time of day or association with position. She does not have photophobia or phonophobia. No aura symptoms. She has no changes in vision or hypotension. She is about 2 weeks post-partum of vaginal delivery and states that she is not sleeping at all at home. She says Ibuprofen provides some relief, using 2-3 times a day. She denies any history of migraines.  This seems like a tension-type headache, unlikely to be migraine. No alarm symptoms for pituitary apoplexy.  -Advise patient to continue Ibuprofen up to 3 times a day as needed -Patient is advised to try to get some sleep, find help to care for her newborn infant to decrease stress at home -Follow up next week.

## 2014-11-15 LAB — URINE CULTURE
Colony Count: NO GROWTH
Organism ID, Bacteria: NO GROWTH

## 2014-11-17 ENCOUNTER — Ambulatory Visit (INDEPENDENT_AMBULATORY_CARE_PROVIDER_SITE_OTHER): Payer: 59 | Admitting: Pharmacist

## 2014-11-17 DIAGNOSIS — Z7901 Long term (current) use of anticoagulants: Secondary | ICD-10-CM

## 2014-11-17 DIAGNOSIS — I2699 Other pulmonary embolism without acute cor pulmonale: Secondary | ICD-10-CM | POA: Diagnosis not present

## 2014-11-17 LAB — POCT INR: INR: 4.9

## 2014-11-18 NOTE — Progress Notes (Signed)
Anti-Coagulation Progress Note  [facility] [name] is a 44 y.o. female who is currently on an anti-coagulation regimen.    RECENT RESULTS: Recent results are below, the most recent result is correlated with a dose of 7.5 mg daily since last visit in the [facility] when she had been seen by Dr. Posey Pronto. Have omitted 1 dose based upon INR value recorded for 1-AUG-16 and decreased her daily dose to 1/2 x 7.5mg  alternating with 1 x 7.5mg  every other day. Through interpretor from [facility], patient was advised to RTC or ED in the event of any signs or symptoms of bleeding (all of which were discussed with the patient) as well as any signs or symptoms of recurrence of her pulmonary embolism (all of which were discussed with the patient and the assistance of the interpretor). Patient verbalized through the interpretor that she had no questions and was accepting of the information as provided to her.  Lab Results  Component Value Date   INR 4.90 11/17/2014   INR 2.9 11/14/2014   INR 1.13 11/10/2014    ANTI-COAG DOSE: Anticoagulation Dose Instructions as of 11/17/2014      Dorene Grebe Tue Wed Thu Fri Sat   New Dose 7.5 mg 0 mg 3.75 mg 7.5 mg 3.75 mg 7.5 mg 3.75 mg       ANTICOAG SUMMARY: Anticoagulation Episode Summary    Current INR goal 2.0-3.0  Next INR check 11/24/2014  INR from last check 4.90! (11/17/2014)  Weekly max dose   Target end date 02/09/2015  [facility]  Preferred lab   Send INR reminders to    Indications  PE (pulmonary embolism) [I26.99]        Comments         ANTICOAG TODAY: Anticoagulation Summary as of 11/17/2014    INR goal 2.0-3.0  Selected INR 4.90! (11/17/2014)  Next INR check 11/24/2014  Target end date 02/09/2015   Indications  PE (pulmonary embolism) [I26.99]      Anticoagulation Episode Summary    [facility]   Preferred lab    Send INR reminders to    Comments       PATIENT INSTRUCTIONS: Patient Instructions   Patient instructed to take medications as defined in the Anti-coagulation Track section of this encounter.  Patient instructed to OMIT/HOLD dose for Monday 1-AUG-16.  Patient verbalized understanding of these instructions by way of interpretor assisting from [facility].       FOLLOW-UP Return in 7 days (on 11/24/2014) for Follow up INR at 4:00PM with Dr. Maudie Mercury.  Jorene Guest, III Pharm.D., CACP

## 2014-11-18 NOTE — Patient Instructions (Signed)
Patient instructed to take medications as defined in the Anti-coagulation Track section of this encounter.  Patient instructed to OMIT/HOLD dose for Monday 1-AUG-16.  Patient verbalized understanding of these instructions by way of interpretor assisting from [facility].

## 2014-11-19 NOTE — Progress Notes (Signed)
INTERNAL MEDICINE TEACHING ATTENDING ADDENDUM - Aldine Contes M.D  Duration-  Till 02/09/15, Indication- PE, INR- supratherapeutic. Agree with pharmacy recommendations as outlined in their note.

## 2014-11-20 NOTE — Progress Notes (Signed)
Internal Medicine Clinic Attending  I saw and evaluated the patient.  I personally confirmed the key portions of the history and exam documented by Dr. Patel,Vishal and I reviewed pertinent patient test results.  The assessment, diagnosis, and plan were formulated together and I agree with the documentation in the resident's note.  

## 2014-11-21 ENCOUNTER — Encounter (INDEPENDENT_AMBULATORY_CARE_PROVIDER_SITE_OTHER): Payer: 59 | Admitting: Internal Medicine

## 2014-11-21 ENCOUNTER — Encounter: Payer: Self-pay | Admitting: Internal Medicine

## 2014-11-21 NOTE — Progress Notes (Signed)
A user error has taken place: encounter opened in error, closed for administrative reasons.

## 2014-11-24 ENCOUNTER — Ambulatory Visit (INDEPENDENT_AMBULATORY_CARE_PROVIDER_SITE_OTHER): Payer: 59 | Admitting: Internal Medicine

## 2014-11-24 ENCOUNTER — Encounter: Payer: Self-pay | Admitting: Internal Medicine

## 2014-11-24 ENCOUNTER — Ambulatory Visit (INDEPENDENT_AMBULATORY_CARE_PROVIDER_SITE_OTHER): Payer: 59 | Admitting: Pharmacist

## 2014-11-24 VITALS — BP 115/67 | HR 58 | Temp 97.6°F | Ht 65.5 in | Wt 176.5 lb

## 2014-11-24 DIAGNOSIS — O24313 Unspecified pre-existing diabetes mellitus in pregnancy, third trimester: Secondary | ICD-10-CM | POA: Diagnosis not present

## 2014-11-24 DIAGNOSIS — M5431 Sciatica, right side: Secondary | ICD-10-CM | POA: Insufficient documentation

## 2014-11-24 DIAGNOSIS — R102 Pelvic and perineal pain: Secondary | ICD-10-CM | POA: Diagnosis not present

## 2014-11-24 DIAGNOSIS — Z7901 Long term (current) use of anticoagulants: Secondary | ICD-10-CM

## 2014-11-24 DIAGNOSIS — I2699 Other pulmonary embolism without acute cor pulmonale: Secondary | ICD-10-CM

## 2014-11-24 LAB — GLUCOSE, CAPILLARY: Glucose-Capillary: 93 mg/dL (ref 65–99)

## 2014-11-24 LAB — POCT INR: INR: 4.8

## 2014-11-24 NOTE — Patient Instructions (Signed)
Please keep taking ibuprofen for your pelvic pain and back pain as needed. Do light exercise to help with the pain.  Keep taking your coumadin. Follow up at the coumadin clinic regularly.   Try taking Metamucil for constipation.  Follow up as needed.

## 2014-11-24 NOTE — Assessment & Plan Note (Signed)
Likely 2/2 to recent vaginal delivery. Continue ibuprofen for now. Asked to discuss with ob/gyn for further recs.

## 2014-11-24 NOTE — Assessment & Plan Note (Signed)
Sciatic pain on right side. Likely 2/2 to increased weight of pregnancy causing nerve irritation. Ibuprofen is helping somewhat.  Explained that this will take time to get better, expect to improve within 3 months. Asked to continue ibuprofen and avoid bed rest. Asked to do light exercise.

## 2014-11-24 NOTE — Assessment & Plan Note (Signed)
Continue coumadin per coumadin clinic recs.

## 2014-11-24 NOTE — Progress Notes (Signed)
   Subjective:    Patient ID: Kim [name], female    DOB: 1970/08/12, 44 y.o.   MRN: [id number]  HPI  44 yo female with hx of gestational diabetes, recent vaginal delivery on 11/04/2014, recently diagnosed right sided PE in acute post partum period on 11/09/14, currently on coumadin until 02/09/15 (finished lovenox bridge).   Finished treatment for UTI with keflex. Denies any urinary complaints now.  Has some pelvic pain, started since the vaginal delivery. Having some vaginal spotting still.   Has back pain since pregnancy, with radiation to right posterior-lateral leg, has some occasional tingling. Ibuprofen helps with pelvic pain and back pain.   Review of Systems  Constitutional: Negative for fever and chills.  HENT: Negative for congestion and sore throat.   Respiratory: Negative for chest tightness, shortness of breath and wheezing.   Cardiovascular: Negative for chest pain, palpitations and leg swelling.  Gastrointestinal: Positive for constipation. Negative for nausea, vomiting, abdominal pain, diarrhea, blood in stool and abdominal distention.  Endocrine: Negative.   Genitourinary: Positive for pelvic pain. Negative for dysuria and hematuria.  Musculoskeletal: Positive for back pain.  Hematological: Negative.   Psychiatric/Behavioral: Negative.        Objective:   Physical Exam  Constitutional: She is oriented to person, place, and time. She appears well-developed and well-nourished. No distress.  HENT:  Head: Normocephalic and atraumatic.  Right Ear: External ear normal.  Left Ear: External ear normal.  Mouth/Throat: Oropharynx is clear and moist. No oropharyngeal exudate.  Eyes: EOM are normal. Pupils are equal, round, and reactive to light.  Neck: Normal range of motion.  Cardiovascular: Normal rate, regular rhythm and normal heart sounds.  Exam reveals no gallop and no friction rub.   No murmur heard. Pulmonary/Chest: Effort normal and breath sounds  normal.  Abdominal: Soft. Bowel sounds are normal. She exhibits no distension. There is no tenderness.  Musculoskeletal: Normal range of motion. She exhibits no edema or tenderness.  Neurological: She is alert and oriented to person, place, and time. No cranial nerve deficit.  Skin: She is not diaphoretic.  Psychiatric: She has a normal mood and affect.    Filed Vitals:   11/24/14 1319  BP: 115/67  Pulse: 58  Temp: 97.6 F (36.4 C)       Assessment & Plan:  See problem based a&P.

## 2014-11-24 NOTE — Assessment & Plan Note (Signed)
Checked [facility] today, 90's. I don't think she has diabetes. Last hgba1c I see in chart was in 4's.  I will not follow this further.

## 2014-11-25 NOTE — Patient Instructions (Signed)
Patient educated about medication as defined in this encounter and verbalized understanding by repeating back instructions provided.   

## 2014-11-25 NOTE — Progress Notes (Signed)
Anticoagulation Management [facility] [name] is a 44 y.o. female who reports to the clinic for monitoring of warfarin treatment.    Indication: PE Duration: 6 months  Anticoagulation Clinic Visit History: Anticoagulation Episode Summary    Current INR goal 2.0-3.0  Next INR check 12/01/2014  INR from last check 4.8! (11/24/2014)  Weekly max dose   Target end date   [facility]  Preferred lab   Send INR reminders to    Indications  Pulmonary embolism [I26.99]        Comments       Anticoagulation Care Providers    Provider Role Specialty Phone number   Dellia Nims, MD Responsible Internal Medicine [phone]     ASSESSMENT Recent Results: Recent results are below, the most recent result is correlated with a dose of 33.75 mg per week: Lab Results  Component Value Date   INR 4.8 11/24/2014   INR 4.90 11/17/2014   INR 2.9 11/14/2014    INR today: Supratherapeutic  Anticoagulation Dosing: INR as of 11/24/2014 and Previous Dosing Information    INR Dt INR Goal [employer] Sun Mon Tue Wed Thu Fri Sat   11/24/2014 4.8 -            Anticoagulation Dose Instructions as of 11/24/2014      Total Sun Mon Tue Wed Thu Fri Sat   New Dose 26.25 mg 3.75 mg 0 mg 0 mg 3.75 mg 7.5 mg 3.75 mg 7.5 mg     (7.5 mg x 0.5)  -  -  (7.5 mg x 0.5)  (7.5 mg x 1)  (7.5 mg x 0.5)  (7.5 mg x 1)                         Description        Hold x 2 doses      PLAN Weekly dose was decreased by 22% to 26.26 mg per week  Patient Instructions  Patient educated about medication as defined in this encounter and verbalized understanding by repeating back instructions provided.    Follow-up No Follow-up on file.  Flossie Dibble Clinical Pharmacist  20 minutes spent face-to-face with the patient during the encounter. 50% of time spent on education. 50% of time was spent on assessment and plan.

## 2014-11-26 ENCOUNTER — Encounter: Payer: Self-pay | Admitting: Internal Medicine

## 2014-11-27 NOTE — Progress Notes (Signed)
Internal Medicine Clinic Attending  Case discussed with Dr. Ahmed soon after the resident saw the patient.  We reviewed the resident's history and exam and pertinent patient test results.  I agree with the assessment, diagnosis, and plan of care documented in the resident's note. 

## 2014-12-01 ENCOUNTER — Ambulatory Visit (INDEPENDENT_AMBULATORY_CARE_PROVIDER_SITE_OTHER): Payer: 59 | Admitting: Pharmacist

## 2014-12-01 DIAGNOSIS — I2699 Other pulmonary embolism without acute cor pulmonale: Secondary | ICD-10-CM

## 2014-12-01 LAB — POCT INR: INR: 3

## 2014-12-01 MED ORDER — WARFARIN SODIUM 7.5 MG PO TABS: ORAL_TABLET | ORAL | Status: DC

## 2014-12-01 NOTE — Patient Instructions (Signed)
Patient instructed to take medications as defined in the Anti-coagulation Track section of this encounter.  Patient instructed to take today's dose.  Patient verbalized understanding of these instructions.    

## 2014-12-01 NOTE — Progress Notes (Signed)
Anti-Coagulation Progress Note  [facility] Kim [name] is a 44 y.o. female who is currently on an anti-coagulation regimen.    RECENT RESULTS: Recent results are below, the most recent result is correlated with a dose of 26.25 mg. per week: Lab Results  Component Value Date   INR 3.0 12/01/2014   INR 4.8 11/24/2014   INR 4.90 11/17/2014    ANTI-COAG DOSE: Anticoagulation Dose Instructions as of 12/01/2014      Dorene Grebe Tue Wed Thu Fri Sat   New Dose 3.75 mg 0 mg 3.75 mg 3.75 mg 3.75 mg 3.75 mg 3.75 mg       ANTICOAG SUMMARY: Anticoagulation Episode Summary    Current INR goal 2.0-3.0  Next INR check 12/15/2014  INR from last check 3.0 (12/01/2014)  Weekly max dose   Target end date   [facility]  Preferred lab   Send INR reminders to    Indications  Pulmonary embolism [I26.99]        Comments       Anticoagulation Care Providers    Provider Role Specialty Phone number   Dellia Nims, MD Responsible Internal Medicine [phone]      ANTICOAG TODAY: Anticoagulation Summary as of 12/01/2014    INR goal 2.0-3.0  Selected INR 3.0 (12/01/2014)  Next INR check 12/15/2014  Target end date    Indications  Pulmonary embolism [I26.99]      Anticoagulation Episode Summary    INR check [facility]   Preferred lab    Send INR reminders to    Comments     Anticoagulation Care Providers    Provider Role Specialty Phone number   Dellia Nims, MD Responsible Internal Medicine [phone]      PATIENT INSTRUCTIONS: Patient Instructions  Patient instructed to take medications as defined in the Anti-coagulation Track section of this encounter.  Patient instructed to take today's dose.  Patient verbalized understanding of these instructions.       FOLLOW-UP Return in 2 weeks (on 12/15/2014) for Follow up INR at 2:30PM with TRANSLATOR required.  Jorene Guest, III Pharm.D., CACP

## 2014-12-03 ENCOUNTER — Other Ambulatory Visit: Payer: Self-pay | Admitting: Family Medicine

## 2014-12-04 NOTE — Progress Notes (Signed)
I have reviewed Dr. Gladstone Pih note.  Patient is on anticoagulation for PE. INR elevated, coumadin decreased.

## 2014-12-15 ENCOUNTER — Ambulatory Visit (INDEPENDENT_AMBULATORY_CARE_PROVIDER_SITE_OTHER): Payer: 59 | Admitting: Pharmacist

## 2014-12-15 DIAGNOSIS — I2699 Other pulmonary embolism without acute cor pulmonale: Secondary | ICD-10-CM | POA: Diagnosis not present

## 2014-12-15 DIAGNOSIS — Z7901 Long term (current) use of anticoagulants: Secondary | ICD-10-CM

## 2014-12-15 LAB — POCT INR: INR: 2

## 2014-12-15 MED ORDER — WARFARIN SODIUM 7.5 MG PO TABS: 3.7500 mg | ORAL_TABLET | Freq: Every day | ORAL | Status: DC

## 2014-12-15 NOTE — Progress Notes (Signed)
Anti-Coagulation Progress Note  [facility] Kim [name] is a 44 y.o. female who is currently on an anti-coagulation regimen.    RECENT RESULTS: Recent results are below, the most recent result is correlated with a dose of 22.25 mg. per week: Lab Results  Component Value Date   INR 2.0 12/15/2014   INR 3.0 12/01/2014   INR 4.8 11/24/2014    ANTI-COAG DOSE: Anticoagulation Dose Instructions as of 12/15/2014      Dorene Grebe Tue Wed Thu Fri Sat   New Dose 3.75 mg 3.75 mg 3.75 mg 3.75 mg 3.75 mg 3.75 mg 3.75 mg       ANTICOAG SUMMARY: Anticoagulation Episode Summary    Current INR goal 2.0-3.0  Next INR check 01/05/2015  INR from last check 2.0 (12/15/2014)  Weekly max dose   Target end date   [facility]  Preferred lab   Send INR reminders to    Indications  Pulmonary embolism [I26.99]        Comments       Anticoagulation Care Providers    Provider Role Specialty Phone number   Dellia Nims, MD Responsible Internal Medicine [phone]      ANTICOAG TODAY: Anticoagulation Summary as of 12/15/2014    INR goal 2.0-3.0  Selected INR 2.0 (12/15/2014)  Next INR check 01/05/2015  Target end date    Indications  Pulmonary embolism [I26.99]      Anticoagulation Episode Summary    INR check [facility]   Preferred lab    Send INR reminders to    Comments     Anticoagulation Care Providers    Provider Role Specialty Phone number   Dellia Nims, MD Responsible Internal Medicine [phone]      PATIENT INSTRUCTIONS: Patient Instructions  Patient instructed to take today's dose. Patient instructed to take according to anticoag tracker section.  Patient through interpreter acknowledged understanding of her new medication regimen.    FOLLOW-UP Return in 3 weeks (on 01/05/2015) for Follow-up INR at 2:30 PM.  Governor Specking, PharmD Clinical Pharmacy Resident Pager: [phone]

## 2014-12-15 NOTE — Patient Instructions (Addendum)
Patient instructed to take medications as defined in the Anti-coagulation Track section of this encounter.  Patient instructed to take today's dose.  Patient verbalized understanding of these instructions through her interpreter.

## 2014-12-17 NOTE — Progress Notes (Signed)
INTERNAL MEDICINE TEACHING ATTENDING ADDENDUM - Ramesh Moan M.D  Duration- indefinite, Indication- PE, INR- therapeutic. Agree with pharmacy recommendations as outlined in their note.     

## 2014-12-29 ENCOUNTER — Ambulatory Visit (INDEPENDENT_AMBULATORY_CARE_PROVIDER_SITE_OTHER): Payer: 59 | Admitting: Pharmacist

## 2014-12-29 ENCOUNTER — Encounter: Payer: Self-pay | Admitting: Internal Medicine

## 2014-12-29 ENCOUNTER — Ambulatory Visit (INDEPENDENT_AMBULATORY_CARE_PROVIDER_SITE_OTHER): Payer: 59 | Admitting: Internal Medicine

## 2014-12-29 VITALS — BP 112/81 | HR 67 | Temp 97.7°F | Ht 65.0 in | Wt 174.8 lb

## 2014-12-29 DIAGNOSIS — I2699 Other pulmonary embolism without acute cor pulmonale: Secondary | ICD-10-CM

## 2014-12-29 DIAGNOSIS — R42 Dizziness and giddiness: Secondary | ICD-10-CM | POA: Diagnosis not present

## 2014-12-29 DIAGNOSIS — Z7901 Long term (current) use of anticoagulants: Secondary | ICD-10-CM | POA: Diagnosis not present

## 2014-12-29 LAB — POCT INR: INR: 1.5

## 2014-12-29 LAB — GLUCOSE, CAPILLARY: Glucose-Capillary: 97 mg/dL (ref 65–99)

## 2014-12-29 MED ORDER — WARFARIN SODIUM 7.5 MG PO TABS: ORAL_TABLET | ORAL | Status: DC

## 2014-12-29 NOTE — Progress Notes (Signed)
   Subjective:    Patient ID: [facility], female    DOB: 1971/03/30, 44 y.o.   MRN: [id number]  HPI  44 yo female with hx of Gestational diabetes, recent vaginal delivery 11/04/2014, recent sided sided PE in acute post partum period on 11/09/14, currently on coumadin until 02/09/15 here with dizziness.  Was seen by Dr. Elie Confer. Her INR goal is 2.0-3.0. INR 2 weeks ago 2.0. Today 1.5. States compliance with coumadin.  Today has some dizziness. Not sure if always with standing up or changing of position. Lasts briefly and then resolves. No vertigo, tinnitus, hearing change. Has brief blurry vision. No numbness, weakness, tingling. No cp/sob/palpitations.  Drank only 1 bottle of water so far today.     Review of Systems  Constitutional: Negative for fever and chills.  HENT: Negative for sore throat and voice change.   Eyes: Negative for visual disturbance.  Respiratory: Negative for chest tightness, shortness of breath and wheezing.   Cardiovascular: Negative for chest pain, palpitations and leg swelling.  Gastrointestinal: Negative for nausea, vomiting and diarrhea.  Endocrine: Negative for polydipsia and polyuria.  Genitourinary: Negative for dysuria, hematuria and vaginal pain.  Musculoskeletal: Negative for arthralgias and neck pain.  Skin: Negative.   Neurological: Positive for dizziness. Negative for tremors, syncope, weakness and numbness.       Objective:   Physical Exam  Constitutional: She is oriented to person, place, and time. She appears well-developed and well-nourished.  Accompanied by her 2 children. Talked using interpretator.   HENT:  Head: Normocephalic and atraumatic.  Mouth/Throat: Oropharynx is clear and moist. No oropharyngeal exudate.  Eyes: Conjunctivae and EOM are normal. Pupils are equal, round, and reactive to light. Right eye exhibits no discharge. Left eye exhibits no discharge. No scleral icterus.  Neck: Normal range of motion.    Cardiovascular: Normal rate, regular rhythm and normal heart sounds.  Exam reveals no gallop and no friction rub.   No murmur heard. Pulmonary/Chest: Effort normal and breath sounds normal. No respiratory distress. She has no wheezes. She has no rales. She exhibits no tenderness.  Abdominal: Soft. Bowel sounds are normal. She exhibits no distension. There is no tenderness. There is no rebound.  Musculoskeletal: Normal range of motion. She exhibits no edema or tenderness.  Both legs equal in size. No tenderness, erythema, warmth.  Neurological: She is alert and oriented to person, place, and time. No cranial nerve deficit. Coordination normal.  Skin: Skin is warm.     Orthostatic vitals:  119/23 HR 64 lying 107/75 HR 77 standing      Assessment & Plan:  See problem based a&p.

## 2014-12-29 NOTE — Progress Notes (Signed)
Anti-Coagulation Progress Note  [facility] Kim [name] is a 44 y.o. female who is currently on an anti-coagulation regimen.    RECENT RESULTS: Recent results are below, the most recent result is correlated with a dose of 26.25 mg. per week: Lab Results  Component Value Date   INR 1.50 12/29/2014   INR 2.0 12/15/2014   INR 3.0 12/01/2014    ANTI-COAG DOSE: Anticoagulation Dose Instructions as of 12/29/2014      Dorene Grebe Tue Wed Thu Fri Sat   New Dose 3.75 mg 7.5 mg 3.75 mg 3.75 mg 7.5 mg 3.75 mg 3.75 mg       ANTICOAG SUMMARY: Anticoagulation Episode Summary    Current INR goal 2.0-3.0  Next INR check 01/19/2015  INR from last check 1.50! (12/29/2014)  Weekly max dose   Target end date   [facility] check location [facility]  Preferred lab   Send INR reminders to    Indications  Pulmonary embolism [I26.99]        Comments       Anticoagulation Care Providers    Provider Role Specialty Phone number   Dellia Nims, MD Responsible Internal Medicine [phone]      ANTICOAG TODAY: Anticoagulation Summary as of 12/29/2014    INR goal 2.0-3.0  Selected INR 1.50! (12/29/2014)  Next INR check 01/19/2015  Target end date    Indications  Pulmonary embolism [I26.99]      Anticoagulation Episode Summary    [facility]R check location [facility]   Preferred lab    Send INR reminders to    Comments     Anticoagulation Care Providers    Provider Role Specialty Phone number   Dellia Nims, MD Responsible Internal Medicine [phone]      PATIENT INSTRUCTIONS: Patient Instructions  Patient instructed to take medications as defined in the Anti-coagulation Track section of this encounter.  Patient instructed to take today's dose.  Patient verbalized understanding of these instructions.       FOLLOW-UP Return in 3 weeks (on 01/19/2015) for Follow up INR at 3:15PM.  Jorene Guest, III Pharm.D., CACP

## 2014-12-29 NOTE — Patient Instructions (Signed)
Patient instructed to take medications as defined in the Anti-coagulation Track section of this encounter.  Patient instructed to take today's dose.  Patient verbalized understanding of these instructions.    

## 2014-12-29 NOTE — Patient Instructions (Addendum)
Your dizziness is likely from not drinking enough water.   Please drink at least 10 bottles of water (80 oz+) and also take some extra salt in your diet.  Your sugar is fine.  Please come back in 1 month to see me. We will do a 75g oral gluocose tolerate test that visit.  Come back if your dizziness does not improve.

## 2014-12-31 ENCOUNTER — Ambulatory Visit: Payer: 59 | Admitting: Internal Medicine

## 2015-01-01 NOTE — Assessment & Plan Note (Addendum)
Drank only 1 bottle of water so far today. + orthostats. Glucose 97. No diahrrrhea or polyuria.  Likely from dehydration from low water intake. Less likely PE (INR was 1.5), but no dvt symptoms, no sob, no tachycardia. Will not do PE workup given she is stable and treatment would be just to continue coumadin anyway.   Asked to drink at least 8 oz+ of water because she is breast feeding. Asked to liberize salt intake.  Asked to come back if does not improve in 1 week.

## 2015-01-01 NOTE — Progress Notes (Signed)
Internal Medicine Clinic Attending  Case discussed with Dr. Ahmed at the time of the visit.  We reviewed the resident's history and exam and pertinent patient test results.  I agree with the assessment, diagnosis, and plan of care documented in the resident's note. 

## 2015-01-04 ENCOUNTER — Emergency Department (HOSPITAL_COMMUNITY): Payer: 59

## 2015-01-04 ENCOUNTER — Encounter (HOSPITAL_COMMUNITY): Payer: Self-pay | Admitting: Emergency Medicine

## 2015-01-04 ENCOUNTER — Emergency Department (HOSPITAL_COMMUNITY)
Admission: EM | Admit: 2015-01-04 | Discharge: 2015-01-04 | Disposition: A | Discharge status: Home / Self Care | Payer: 59 | Attending: Emergency Medicine | Admitting: Emergency Medicine

## 2015-01-04 DIAGNOSIS — O9963 Diseases of the digestive system complicating the puerperium: Secondary | ICD-10-CM | POA: Diagnosis present

## 2015-01-04 DIAGNOSIS — Z87448 Personal history of other diseases of urinary system: Secondary | ICD-10-CM | POA: Diagnosis not present

## 2015-01-04 DIAGNOSIS — J45909 Unspecified asthma, uncomplicated: Secondary | ICD-10-CM | POA: Insufficient documentation

## 2015-01-04 DIAGNOSIS — K8020 Calculus of gallbladder without cholecystitis without obstruction: Secondary | ICD-10-CM | POA: Diagnosis not present

## 2015-01-04 DIAGNOSIS — O9953 Diseases of the respiratory system complicating the puerperium: Secondary | ICD-10-CM | POA: Insufficient documentation

## 2015-01-04 DIAGNOSIS — Z7901 Long term (current) use of anticoagulants: Secondary | ICD-10-CM | POA: Diagnosis not present

## 2015-01-04 DIAGNOSIS — Z8632 Personal history of gestational diabetes: Secondary | ICD-10-CM | POA: Diagnosis not present

## 2015-01-04 DIAGNOSIS — Z859 Personal history of malignant neoplasm, unspecified: Secondary | ICD-10-CM | POA: Insufficient documentation

## 2015-01-04 LAB — URINALYSIS, ROUTINE W REFLEX MICROSCOPIC
Bilirubin Urine: NEGATIVE
Glucose, UA: NEGATIVE mg/dL
Hgb urine dipstick: NEGATIVE
Ketones, ur: NEGATIVE mg/dL
Nitrite: NEGATIVE
Protein, ur: NEGATIVE mg/dL
Specific Gravity, Urine: 1.019 (ref 1.005–1.030)
Urobilinogen, UA: 1 mg/dL (ref 0.0–1.0)
pH: 6.5 (ref 5.0–8.0)

## 2015-01-04 LAB — COMPREHENSIVE METABOLIC PANEL
ALT: 24 U/L (ref 14–54)
AST: 28 U/L (ref 15–41)
Albumin: 3.3 g/dL — ABNORMAL LOW (ref 3.5–5.0)
Alkaline Phosphatase: 87 U/L (ref 38–126)
Anion gap: 9 (ref 5–15)
BUN: 14 mg/dL (ref 6–20)
CO2: 20 mmol/L — ABNORMAL LOW (ref 22–32)
Calcium: 8.4 mg/dL — ABNORMAL LOW (ref 8.9–10.3)
Chloride: 112 mmol/L — ABNORMAL HIGH (ref 101–111)
Creatinine, Ser: 0.62 mg/dL (ref 0.44–1.00)
GFR calc Af Amer: >60 mL/min (ref >60)
GFR calc non Af Amer: >60 mL/min (ref >60)
Glucose, Bld: 128 mg/dL — ABNORMAL HIGH (ref 65–99)
Potassium: 3.7 mmol/L (ref 3.5–5.1)
Sodium: 141 mmol/L (ref 135–145)
Total Bilirubin: 0.4 mg/dL (ref 0.3–1.2)
Total Protein: 6.2 g/dL — ABNORMAL LOW (ref 6.5–8.1)

## 2015-01-04 LAB — CBC WITH DIFFERENTIAL/PLATELET
Basophils Absolute: 0 10*3/uL (ref 0.0–0.1)
Basophils Relative: 0 %
Eosinophils Absolute: 0.2 10*3/uL (ref 0.0–0.7)
Eosinophils Relative: 3 %
HCT: 38.6 % (ref 36.0–46.0)
Hemoglobin: 13.3 g/dL (ref 12.0–15.0)
Lymphocytes Relative: 38 %
Lymphs Abs: 3 10*3/uL (ref 0.7–4.0)
MCH: 30.2 pg (ref 26.0–34.0)
MCHC: 34.5 g/dL (ref 30.0–36.0)
MCV: 87.5 fL (ref 78.0–100.0)
Monocytes Absolute: 0.5 10*3/uL (ref 0.1–1.0)
Monocytes Relative: 6 %
Neutro Abs: 4.2 10*3/uL (ref 1.7–7.7)
Neutrophils Relative %: 53 %
Platelets: 224 10*3/uL (ref 150–400)
RBC: 4.41 MIL/uL (ref 3.87–5.11)
RDW: 13.9 % (ref 11.5–15.5)
WBC: 7.9 10*3/uL (ref 4.0–10.5)

## 2015-01-04 LAB — PROTIME-INR
INR: 2.27 — ABNORMAL HIGH (ref 0.00–1.49)
Prothrombin Time: 24.8 seconds — ABNORMAL HIGH (ref 11.6–15.2)

## 2015-01-04 LAB — URINE MICROSCOPIC-ADD ON

## 2015-01-04 LAB — LIPASE, BLOOD: Lipase: 28 U/L (ref 22–51)

## 2015-01-04 LAB — I-STAT TROPONIN, ED: Troponin i, poc: 0 ng/mL (ref 0.00–0.08)

## 2015-01-04 MED ORDER — SODIUM CHLORIDE 0.9 % IV BOLUS (SEPSIS)
500.0000 mL | Freq: Once | INTRAVENOUS | Status: AC
  Administered 2015-01-04: 500 mL via INTRAVENOUS

## 2015-01-04 MED ORDER — ONDANSETRON HCL 4 MG/2ML IJ SOLN
4.0000 mg | Freq: Once | INTRAMUSCULAR | Status: AC
  Administered 2015-01-04: 4 mg via INTRAVENOUS
  Filled 2015-01-04: qty 2

## 2015-01-04 MED ORDER — MORPHINE SULFATE (PF) 4 MG/ML IV SOLN
4.0000 mg | Freq: Once | INTRAVENOUS | Status: AC
Start: 2015-01-04 — End: 2015-01-04
  Administered 2015-01-04: 4 mg via INTRAVENOUS
  Filled 2015-01-04: qty 1

## 2015-01-04 MED ORDER — HYDROCODONE-ACETAMINOPHEN 5-325 MG PO TABS: 1.0000 | ORAL_TABLET | ORAL | Status: DC | PRN

## 2015-01-04 MED ORDER — MORPHINE SULFATE (PF) 4 MG/ML IV SOLN
4.0000 mg | Freq: Once | INTRAVENOUS | Status: AC
  Administered 2015-01-04: 4 mg via INTRAVENOUS
  Filled 2015-01-04: qty 1

## 2015-01-04 MED ORDER — ONDANSETRON 4 MG PO TBDP: ORAL_TABLET | ORAL | Status: DC

## 2015-01-04 NOTE — Discharge Instructions (Signed)
Call and make an appointment to follow-up with the general surgeon. Return immediately for worsening pain, fever, persistent vomiting or any concerns.   Clico biliar (Biliary Colic)  El clico biliar es un dolor continuo o irregular en la zona superior del abdomen. Generalmente se ubica debajo de la zona derecha de la caja torcica. Aparece cuando los clculos biliares interfieren con el flujo normal de la bilis que proviene de la vescula. La bilis es un lquido que interviene en la digestin de las [place]. Se produce en el hgado y se almacena en la vescula. Al comer, La bilis pasa desde la vescula, a travs del conducto cstico y el conducto biliar comn al intestino delgado. All se mezcla con la comida parcialmente digerida. Si un clculo obstruye alguno de esos conductos, se detiene el flujo normal de bilis. Las clulas del conducto biliar se contraen con fuerza para mover el clculo. Esto causa el dolor del clico biliar.  SNTOMAS  El paciente se queja de dolor en la zona superior del abdomen. El dolor puede ser:  En el centro de la zona superior del abdomen, justo por debajo del esternn.  En la zona superior derecha del abdomen, donde se encuentra la vescula biliar y el hgado.  Se expande hacia la espalda, hacia el omplato derecho.  Nuseas y vmitos  El dolor comienza generalmente despus de comer.  El clico biliar aparece como una demanda de bilis por parte del sistema digestivo. La demanda de bilis es mayor luego de ingerir alimentos ricos en grasas. Los sntomas tambin Geophysicist/field seismologist que han estado ayunando e ingieren abruptamente una comida abundante. La mayora de los episodios de clico biliar mejoran luego de 1 a 5 horas. Despus que se alivia el dolor ms intenso, podr seguir sintiendo un dolor moderado en el abdomen durante un lapso de 24 horas. DIAGNSTICO Luego de escuchar la descripcin de los sntomas, el mdico realizar un examen fsico. Deber  prestar atencin a la zona superior del abdomen. Esta es la zona en la que se encuentra el hgado y la vescula biliar. El mdico podr observar los clculos a travs de una ecografa. Tambin le realizaran un escaneo especializado de la vescula biliar. Le indicarn anlisis de [place], especialmente si tiene fiebre o el dolor persiste. PREVENCIN El clico biliar puede evitarse controlando los factores de riesgo que favorecen los clculos. Algunos de [employer] factores de riesgo como la herencia, el aumento de la edad y Water quality scientist son aspectos normales de la vida. La obesidad y [place] dieta rica en grasas son factores de riesgo que usted puede modificar a travs de cambios hacia un estilo de vida saludable. Las mujeres que atraviesan la menopausia y que reciben terapia de reemplazo hormonal (estrgenos) tambin tienen ms riesgo de Actor clicos biliares. TRATAMIENTO  Le prescribirn analgsicos.  Le indicarn una dieta sin grasas.  Si el primer episodio es intenso, o si los clicos aparecen nuevamente, generalmente se indica la ciruga para extirpar la vescula (colecistectoma). Este procedimiento puede realizarse a travs de pequeas incisiones utilizando un instrumento denominado laparoscopio. Muchas veces se requiere una breve estada en el hospital. Algunas personas reciben el alta el mismo da. Es el tratamiento ms ampliamente utilizado en personas que sufren dolor por clculos biliares. Es efectivo y seguro, no tiene complicaciones en ms del 90% de los [place].  Si la ciruga no puede llevarse a cabo, podrn utilizarse medicamentos para [employer] clculos. Esta medicacin es cara y [place]  que [employer]. Slo podr disolver clculos pequeos.  En algunos casos raros, se combinan estos medicamentos con un procedimiento denominado litotricia por ondas de choque. Este procedimiento [place] ondas de choque cuidadosamente dirigidas a romper los clculos. En muchas  personas tratadas con este procedimiento, los clculos vuelven a formarse luego de [employer]. PRONSTICO Si los clculos obstruyen el conducto cstico o conducto biliar comn, usted tiene el riesgo de sufrir episodios repetidos de clicos biliares. Tambin existe un 25% de probabilidades de desarrollar una infeccin de la vescula biliar (colecistitisaguda) o alguna otra complicacin en los siguientes 10 a 20 aos. Si ha sido sometido a [place], progrmela para el momento en que sea conveniente para usted, y para cuando no se encuentre enfermo. INSTRUCCIONES PARA EL CUIDADO DOMICILIARIO  Beba gran cantidad de lquidos claros.  Evite las comidas con mucha grasa o fritas, o cualquier alimento que empeore su dolor.  Tome los medicamentos como se le indic. SOLICITE ATENCIN MDICA SI:  Le sube la temperatura a ms de 100.5 F (38.1 C).  El dolor empeora con el [place].  Siente nuseas y [employer] comer y beber.  Tiene vmitos. SOLICITE ATENCIN MDICA DE INMEDIATO SI:  Siente dolor continuo e intenso en el abdomen, que no se alivia con medicamentos.  Siente nuseas y vmitos que no mejoran con medicamentos.  Tiene sntomas de clico biliar y comienza a Irene Shipper y escalofros. Esto puede ser un indicio de que ha desarrollado colecistitis. Comunquese con su mdico inmediatamente.  Su piel o la parte blanca del ojo se vuelven amarillas (ictericia). Document Released: 07/12/2007 Document Revised: 06/27/2011 [facility] Patient Information 2015 [place]. This information is not intended to replace advice given to you by your health care provider. Make sure you discuss any questions you have with your health care provider.

## 2015-01-04 NOTE — ED Notes (Signed)
Pt transported to [place] with transporter

## 2015-01-04 NOTE — ED Notes (Signed)
Pt presents with [facility] abd pain and nausea that had a sudden onset with "feeling like I was going to faint"; pt denies LOC, vomiting, or diarrhea; pt reports she is 6wks post-partum and dx with PE and on warfarin (last INR 1.75); pt denies falls or trauma to belly; EMS reports pt was pale and anxious on arrival and c/o chest tightness - now resolved

## 2015-01-04 NOTE — ED Provider Notes (Signed)
CSN: [id number]   Arrival date & time 01/04/15 0304  History  This chart was scribed for Kim Rice, MD by Altamease Oiler, ED Scribe. This patient was seen in room B19C/B19C and the patient's care was started at 3:27 AM.  Chief Complaint  Patient presents with  . Abdominal Pain    HPI The history is provided by the patient. A language interpreter was used.   Brought in by EMS, [facility] [name] is a 44 y.o. female who is 6 weeks post-partum on warfarin for a PE presenting to the Emergency Department with a chief complaint of new and constant abdominal pain with sudden onset 1 hour ago. The severe pain is primarily in the epigastric region but intermittently radiates to the whole abdomen. She notes that the discomfort waxes and wanes in intensity. Took nothing for pain PTA . Associated symptoms include nausea, chest tightness, light headedness, back pain with inspiration, and an episode of purple discoloration at the lips per family. Pt denies vomiting.   She is Spanish speaking. The history is obtained with the use of a phone interpreter and family at the bedside.  Past Medical History  Diagnosis Date  . 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 Asthma     when pregnant/ once 6 yrs ago only time asthma attack the dr said  . Gestational diabetes   . Cancer   . Renal insufficiency     Past Surgical History  Procedure Laterality Date  . No past surgeries      Family History  Problem Relation Age of Onset  . Diabetes Mother   . Hypertension Mother   . Hyperlipidemia Father   . Diabetes Father   . Hypertension Father   . Heart disease Sister   . Diabetes Brother   . Hyperlipidemia Brother   . Drug abuse Paternal Grandmother   . Kidney disease Other   . Birth defects Other     anal atresia/stenosis    Social History  Substance Use Topics  . Smoking status: Never Smoker   . Smokeless tobacco: Never Used  . Alcohol  Use: No     Review of Systems  Constitutional: Negative for fever and chills.  Respiratory: Negative for cough and shortness of breath.   Cardiovascular: Positive for chest pain. Negative for palpitations and leg swelling.  Gastrointestinal: Positive for nausea, vomiting and abdominal pain. Negative for diarrhea and constipation.  Musculoskeletal: Positive for back pain. Negative for neck pain and neck stiffness.  Skin: Negative for rash and wound.  Neurological: Negative for dizziness, weakness, light-headedness, numbness and headaches.  All other systems reviewed and are negative.  Home Medications   Prior to Admission medications   Medication Sig Start Date End Date Taking? Authorizing Provider  HYDROcodone-acetaminophen (NORCO) 5-325 MG per tablet Take 1-2 tablets by mouth every 4 (four) hours as needed for severe pain. 01/04/15   Kim Rice, MD  ibuprofen (ADVIL,MOTRIN) 600 MG tablet Take 1 tablet (600 mg total) by mouth every 6 (six) hours as needed for mild pain. 11/06/14   [name], MD  ondansetron (ZOFRAN ODT) 4 MG disintegrating tablet 4mg  ODT q4 hours prn nausea/vomit 01/04/15   Kim Rice, MD  OVER THE COUNTER MEDICATION Apply 1 spray topically daily as needed (wounds). OTC antibacterial spray for wounds    Historical Provider, MD  Prenatal Vit-Fe Fumarate-FA (PNV PRENATAL PLUS MULTIVITAMIN) 27-1 MG TABS Take 1 tablet  by mouth daily. 10/23/14   Historical Provider, MD  ranitidine (ZANTAC) 150 MG tablet Take 150 mg by mouth 2 (two) times daily as needed for heartburn.    Historical Provider, MD  warfarin (COUMADIN) 7.5 MG tablet Take one tablet by mouth on Mondays and Thursdays; take 1/2 tablet all other days. 12/29/14   [name], MD    Allergies  Review of patient's allergies indicates no known allergies.  Triage Vitals: BP 146/69 mmHg  Pulse 71  Temp(Src) 97.7 F (36.5 C) (Oral)  Resp 16  SpO2 100%  LMP   Physical Exam  Constitutional: She is  oriented to person, place, and time. She appears well-developed and well-nourished. No distress.  HENT:  Head: Normocephalic and atraumatic.  Mouth/Throat: Oropharynx is clear and moist. No oropharyngeal exudate.  Eyes: EOM are normal. Pupils are equal, round, and reactive to light.  Neck: Normal range of motion. Neck supple.  Cardiovascular: Normal rate and regular rhythm.   Pulmonary/Chest: Effort normal and breath sounds normal. No respiratory distress. She has no wheezes. She has no rales. She exhibits no tenderness.  Abdominal: Soft. Bowel sounds are normal. She exhibits no distension and no mass. There is tenderness (Tenderness to palpation in the right upper quadrant.). There is no rebound and no guarding.  Musculoskeletal: Normal range of motion. She exhibits no edema or tenderness.  No lower extremity swelling or pain. Patient has tenderness to palpation in the bilateral lower thoracic region. There is no midline thoracic or lumbar tenderness. No CVA tenderness.  Neurological: She is alert and oriented to person, place, and time.  Moves all extremities without deficit. Sensation is fully intact.  Skin: Skin is warm and dry. No rash noted. No erythema.  Psychiatric: She has a normal mood and affect. Her behavior is normal.  Nursing note and vitals reviewed.   ED Course  Procedures   DIAGNOSTIC STUDIES: Oxygen Saturation is 100% on RA, normal by my interpretation.    COORDINATION OF CARE: 3:34 AM Discussed treatment plan which includes lab work, CXR, EKG, abdominal ultrasound, IVF, morphine, and Zofran with pt at bedside and pt agreed to plan.  Labs Reviewed  COMPREHENSIVE METABOLIC PANEL - Abnormal; Notable for the following:    Chloride 112 (*)    CO2 20 (*)    Glucose, Bld 128 (*)    Calcium 8.4 (*)    Total Protein 6.2 (*)    Albumin 3.3 (*)    All other components within normal limits  PROTIME-INR - Abnormal; Notable for the following:    Prothrombin Time 24.8 (*)     INR 2.27 (*)    All other components within normal limits  URINALYSIS, ROUTINE W REFLEX MICROSCOPIC (NOT AT [facility]) - Abnormal; Notable for the following:    Leukocytes, UA LARGE (*)    All other components within normal limits  CBC WITH DIFFERENTIAL/PLATELET  LIPASE, BLOOD  URINE MICROSCOPIC-ADD ON  Randolm Idol, ED    Imaging Review Dg Chest 2 View  01/04/2015   CLINICAL DATA:  Abdominal pain, nausea, presyncope. RIGHT upper quadrant pain. History of cancer.  EXAM: CHEST  2 VIEW  COMPARISON:  CT chest November 09, 2014  FINDINGS: Cardiomediastinal silhouette is normal. The lungs are clear without pleural effusions or focal consolidations. LEFT lung base strandy densities. Trachea projects midline and there is no pneumothorax. Soft tissue planes and included osseous structures are non-suspicious.  IMPRESSION: LEFT lung base atelectasis.   Electronically Signed   By: Thana Farr.D.  On: 01/04/2015 04:40   US Abdomen Limited  01/04/2015   CLINICAL DATA:  Epigastric pain for 1 day.  EXAM: US ABDOMEN LIMITED - RIGHT UPPER QUADRANT  COMPARISON:  None.  FINDINGS: Gallbladder:  Physiologically distended. Mobile non shadowing gallstone measures 7 mm. Non mobile echogenic foci adherent to the gallbladder wall. These measure from 3-8 mm, largest with comet tail artifact. No gallbladder wall thickening. No sonographic Murphy sign noted.  Common bile duct:  Diameter: 4 mm  Liver:  No focal lesion identified. Heterogeneous in parenchymal echogenicity. Normal directional flow in the main portal vein.  IMPRESSION: 1. Mobile gallstone, no findings of acute cholecystitis. 2. Multiple non mobile echogenic foci adherent to the gallbladder wall measuring from 3-8 mm. This likely represents a combination of polyps and adenomyomatosis. 3. Heterogeneous liver parenchyma suggestive of hepatic steatosis.   Electronically Signed   By: Jeb Levering M.D.   On: 01/04/2015 05:34    EKG  Interpretation  Date/Time:  Sunday January 04 2015 03:41:10 EDT Ventricular Rate:  69 PR Interval:  163 QRS Duration: 123 QT Interval:  439 QTC Calculation: 470 R Axis:   61 Text Interpretation:  Sinus rhythm Nonspecific intraventricular conduction delay Confirmed by Opaline Reyburn  MD, Fredia Chittenden ([id number]) on 01/04/2015 3:59:42 AM    MDM   Final diagnoses:  Calculus of gallbladder without cholecystitis without obstruction     I personally performed the services described in this documentation, which was scribed in my presence. The recorded information has been reviewed and is accurate.   Patient's pain is completely resolved. Ultrasound without evidence of gallstones and no evidence of cholecystitis. Normal white blood cell count. LFTs are within normal limits. We'll refer patient to general surgery. She's been advised to avoid fatty and oily foods. Return precautions have been given and patient has voiced understanding.  Kim Rice, MD 01/04/15 [phone]

## 2015-01-05 ENCOUNTER — Ambulatory Visit (INDEPENDENT_AMBULATORY_CARE_PROVIDER_SITE_OTHER): Payer: 59 | Admitting: Internal Medicine

## 2015-01-05 ENCOUNTER — Encounter: Payer: Self-pay | Admitting: Internal Medicine

## 2015-01-05 ENCOUNTER — Ambulatory Visit: Payer: 59

## 2015-01-05 VITALS — BP 110/77 | HR 64 | Temp 98.2°F | Wt 173.2 lb

## 2015-01-05 DIAGNOSIS — F53 Postpartum depression: Secondary | ICD-10-CM | POA: Insufficient documentation

## 2015-01-05 DIAGNOSIS — K8020 Calculus of gallbladder without cholecystitis without obstruction: Secondary | ICD-10-CM | POA: Diagnosis not present

## 2015-01-05 DIAGNOSIS — O99345 Other mental disorders complicating the puerperium: Secondary | ICD-10-CM

## 2015-01-05 MED ORDER — HYDROCODONE-ACETAMINOPHEN 5-325 MG PO TABS: 1.0000 | ORAL_TABLET | ORAL | Status: DC | PRN

## 2015-01-05 MED ORDER — SERTRALINE HCL 100 MG PO TABS: 100.0000 mg | ORAL_TABLET | Freq: Every day | ORAL | Status: DC

## 2015-01-05 NOTE — Patient Instructions (Addendum)
Take Zoloft for your depression. Take half tablet for 7 days. Then take 1 tablet daily after that.  Referred you to Surgery. It's best if you can wait until 02/09/15 to have surgery that day we don't need to stop your coumadin.  Follow up in 4 weeks.  If you have any thoughts about hurting your self or anyone else please call us or go to the Emergency department.

## 2015-01-05 NOTE — Progress Notes (Signed)
Subjective:   Patient ID: Kim [name] Kim [name] female   DOB: October 02, 1970 44 y.o.   MRN: [id number]  HPI: [name] [name] is a 44 y.o. with hx of gestational diabetes, vaginal delivery on 11/04/14, then developed R poster lower lobe PE. On coumadin until 02/09/15.  Was seen for dizziness by me on 9/12.  Went to ED with abd pain on 9/18, had RUQ tenderness. LFTs and CBC normal. Lipase normal.  RUQ u/s: mobile gallstone measure 7 mm, no cholecystitis. mutiple non mobile echogenic foci adherent to gall wall from 3-8 mm, likely polyp vs adenomyomatosis.  Patient states her pain is well intermittent, colicky, 9/39 with hydrocodone, feels like she has to have a bowel movement. Pain is worse after eating. Has some nausea. No vomiting. No diarrhea. No fever/chills. Never had gallbladder problem before.   As we are discussing her pain, she also became tearful and was talking about feeling depressed for 1+ month after her new child was born. She at times does not wan to take care of the baby and feels guilty about it. She even had argument with her 65 year old daughter and she left from pt's house. She is feeling helpless and guilty about the whole situation. She had felt depressed 6 years ago after her other child's birth which resolved without treatment. She had another episode of depression 2 years ago which was treated by medication (can't recall the name or the prescriber). She took the med for 2 months and had resolution of her depression.  She is appropriately concerned about taking medications (hydrocodone) and also antidepressant while breastfeeding and being on Coumadin.   Her dizziness has resolved since last visit.     Past Medical History  Diagnosis Date  . 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 Asthma     when pregnant/ once 6 yrs ago only time asthma attack the dr said  . Gestational diabetes   . Cancer   . Renal  insufficiency    Current Outpatient Prescriptions  Medication Sig Dispense Refill  . HYDROcodone-acetaminophen (NORCO) 5-325 MG per tablet Take 1-2 tablets by mouth every 4 (four) hours as needed for severe pain. 20 tablet 0  . ibuprofen (ADVIL,MOTRIN) 600 MG tablet Take 1 tablet (600 mg total) by mouth every 6 (six) hours as needed for mild pain. 30 tablet 5  . ondansetron (ZOFRAN ODT) 4 MG disintegrating tablet 4mg  ODT q4 hours prn nausea/vomit 8 tablet 0  . OVER THE COUNTER MEDICATION Apply 1 spray topically daily as needed (wounds). OTC antibacterial spray for wounds    . Prenatal Vit-Fe Fumarate-FA (PNV PRENATAL PLUS MULTIVITAMIN) 27-1 MG TABS Take 1 tablet by mouth daily.    . ranitidine (ZANTAC) 150 MG tablet Take 150 mg by mouth 2 (two) times daily as needed for heartburn.    . warfarin (COUMADIN) 7.5 MG tablet Take one tablet by mouth on Mondays and Thursdays; take 1/2 tablet all other days. 20 tablet 1   No current facility-administered medications for this visit.   Family History  Problem Relation Age of Onset  . Diabetes Mother   . Hypertension Mother   . Hyperlipidemia Father   . Diabetes Father   . Hypertension Father   . Heart disease Sister   . Diabetes Brother   . Hyperlipidemia Brother   . Drug abuse Paternal Grandmother   . Kidney disease Other   . Birth  defects Other     anal atresia/stenosis   Social History   Social History  . Marital Status: Married    Spouse Name: [name]  . Number of Children: [name]  . Years of Education: [name]   Social History Main Topics  . Smoking status: Never Smoker   . Smokeless tobacco: Never Used  . Alcohol Use: No  . Drug Use: No  . Sexual Activity: Not on file   Other Topics Concern  . Not on file   Social History Narrative   Married with four children. Came to [place] from [place] in 2001. Unemployed currently. Spanish speaking.   Review of Systems: Review of Systems  Constitutional: Negative for fever and chills.    HENT: Negative for sore throat.   Eyes: Negative for double vision and photophobia.  Respiratory: Negative for cough, hemoptysis, sputum production, shortness of breath and wheezing.   Cardiovascular: Negative for chest pain, palpitations and leg swelling.  Gastrointestinal: Positive for nausea and abdominal pain. Negative for heartburn, vomiting, diarrhea, constipation and blood in stool.  Genitourinary: Negative for dysuria and hematuria.  Musculoskeletal: Negative for back pain, joint pain and neck pain.  Skin: Negative for rash.  Neurological: Negative for dizziness, focal weakness, seizures, loss of consciousness, weakness and headaches.  Psychiatric/Behavioral: Positive for depression. Negative for suicidal ideas, hallucinations, memory loss and substance abuse. The patient is nervous/anxious. The patient does not have insomnia.     Objective:  Physical Exam: Filed Vitals:   01/05/15 1447  BP: 110/77  Pulse: 64  Temp: 98.2 F (36.8 C)    Physical Exam  Constitutional: She is oriented to person, place, and time. She appears well-developed and well-nourished.  Tearful during interview.   HENT:  Head: Normocephalic and atraumatic.  Mouth/Throat: Oropharynx is clear and moist.  Eyes: Pupils are equal, round, and reactive to light. No scleral icterus.  Neck: Normal range of motion. No JVD present.  Cardiovascular: Normal rate and regular rhythm.  Exam reveals no gallop and no friction rub.   No murmur heard. Respiratory: No respiratory distress. She has no wheezes. She has no rales. She exhibits no tenderness.  GI: Soft. Bowel sounds are normal. She exhibits no distension. There is no tenderness.  No RUQ tenderness, no murphy's sign. No jaundice.   Musculoskeletal: Normal range of motion. She exhibits no edema or tenderness.  Neurological: She is alert and oriented to person, place, and time.  Psychiatric: Her speech is normal and behavior is normal. Judgment normal. Her mood  appears not anxious. She exhibits a depressed mood. She expresses no homicidal and no suicidal ideation. She expresses no suicidal plans and no homicidal plans.  Depressed but no SI/HI/AVH.     Assessment & Plan:   See problemb asked a&p.

## 2015-01-06 NOTE — Assessment & Plan Note (Signed)
Discussed risk and benefit for taking Zoloft. Given her life is significantly impacted by her depression, the benefit of treating her depression with Zoloft would outweigh the low risk of taking Zoloft while breastfeeding. All the data shows that there is very low serum level of Zoloft in babies whose mother take Zoloft and breastfeed.  Will start Zoloft 100mg  daily (to take 50 mg for 1 week then take 100mg  daily after that).  Follow up in 1 month.   Asked to seek help (call us or go to the ED) if she feels suicidal or homicidal.

## 2015-01-06 NOTE — Assessment & Plan Note (Signed)
Currently no sign of cholangitis or cholecystitis on exam or history. Has mild pain that's well controlled on hydrocodone.   Discussed that if the pain can be tolerated with the hydrocodone and avoidance of fatty food, as long as she does not develop signs of acute cholecystitis, she should be evaluated by gen surgery but should wait until after 02/09/2015 to have the suergery (when she would be done with her anticoagulation for her PE).  Discussed with Dr. Elie Confer (who manager has anticoag) about the timing of anticoag and her surgery. He recommended off coumadin before surgery and then also after wards to avoid recurrent PE if surgery is done before 02/09/15. If it happens after that, there is no need to bridge or anticoagulate her.  Assured patient that Hydrocodone should be safe to take (per Dr. Elie Confer) while breastfeeding.

## 2015-01-07 NOTE — Progress Notes (Signed)
Internal Medicine Clinic Attending  Case discussed with Dr. Genene Churn soon after the resident saw the patient.  We reviewed the resident's history and exam and pertinent patient test results.  I agree with the assessment, diagnosis, and plan of care documented in the resident's note. Pt's U/Ss showed GB polyps of 3-8 mm. Consider repeat US 6 months if she still has her gallbladder

## 2015-01-19 ENCOUNTER — Ambulatory Visit (INDEPENDENT_AMBULATORY_CARE_PROVIDER_SITE_OTHER): Payer: 59 | Admitting: Pharmacist

## 2015-01-19 DIAGNOSIS — I2699 Other pulmonary embolism without acute cor pulmonale: Secondary | ICD-10-CM

## 2015-01-19 DIAGNOSIS — Z7901 Long term (current) use of anticoagulants: Secondary | ICD-10-CM

## 2015-01-19 LAB — POCT INR: INR: 1.9

## 2015-01-19 NOTE — Progress Notes (Signed)
Anti-Coagulation Progress Note  [facility] [name] is a 44 y.o. female who is currently on an anti-coagulation regimen.    RECENT RESULTS: Recent results are below, the most recent result is correlated with a dose of 33.75 mg. per week: Lab Results  Component Value Date   INR 1.90 01/19/2015   INR 2.27* 01/04/2015   INR 1.50 12/29/2014    ANTI-COAG DOSE: Anticoagulation Dose Instructions as of 01/19/2015      Sun Mon Tue Wed Thu Fri Sat   New Dose 3.75 mg 7.5 mg 7.5 mg 7.5 mg 7.5 mg 3.75 mg 3.75 mg       ANTICOAG SUMMARY: Anticoagulation Episode Summary    Current INR goal 2.0-3.0  Next INR check 02/02/2015  INR from last check 1.90! (01/19/2015)  Weekly max dose   Target end date   [facility]  Preferred lab   Send INR reminders to    Indications  Pulmonary embolism [facility]) [I26.99]        Comments       Anticoagulation Care Providers    Provider Role Specialty Phone number   Dellia Nims, MD Responsible Internal Medicine [phone]      ANTICOAG TODAY: Anticoagulation Summary as of 01/19/2015    INR goal 2.0-3.0  Selected INR 1.90! (01/19/2015)  Next INR check 02/02/2015  Target end date    Indications  Pulmonary embolism ([place]) [I26.99]      Anticoagulation Episode Summary    [facility]   Preferred lab    Send INR reminders to    Comments     Anticoagulation Care Providers    Provider Role Specialty Phone number   Dellia Nims, MD Responsible Internal Medicine [phone]      PATIENT INSTRUCTIONS: Patient Instructions  Patient instructed to take medications as defined in the Anti-coagulation Track section of this encounter.  Patient instructed to take today's dose.  Patient instructions were communicated by spanish speaking interpreter accompanying the patient. Patient verbalized understanding of these instructions to the interpreter.       FOLLOW-UP Return in 2 weeks (on 02/02/2015) for  Follow up INR at 3:30PM WITH INTERPRETER.  Jorene Guest, III Pharm.D., CACP

## 2015-01-19 NOTE — Patient Instructions (Signed)
Patient instructed to take medications as defined in the Anti-coagulation Track section of this encounter.  Patient instructed to take today's dose.  Patient instructions were communicated by spanish speaking interpreter accompanying the patient. Patient verbalized understanding of these instructions to the interpreter.

## 2015-01-20 NOTE — Progress Notes (Signed)
INTERNAL MEDICINE TEACHING ATTENDING ADDENDUM - Lalla Brothers M.D  Duration- indefinite, Indication- PE, INR- 1.9. Agree with pharmacy recommendations as outlined in their note.

## 2015-01-28 ENCOUNTER — Encounter: Payer: Self-pay | Admitting: Internal Medicine

## 2015-01-28 ENCOUNTER — Ambulatory Visit (INDEPENDENT_AMBULATORY_CARE_PROVIDER_SITE_OTHER): Payer: 59 | Admitting: Internal Medicine

## 2015-01-28 VITALS — BP 105/52 | HR 57 | Temp 97.5°F | Wt 172.7 lb

## 2015-01-28 DIAGNOSIS — F53 Postpartum depression: Secondary | ICD-10-CM

## 2015-01-28 DIAGNOSIS — Z7901 Long term (current) use of anticoagulants: Secondary | ICD-10-CM | POA: Diagnosis not present

## 2015-01-28 DIAGNOSIS — Z833 Family history of diabetes mellitus: Secondary | ICD-10-CM

## 2015-01-28 DIAGNOSIS — Z8632 Personal history of gestational diabetes: Secondary | ICD-10-CM | POA: Diagnosis not present

## 2015-01-28 DIAGNOSIS — K8020 Calculus of gallbladder without cholecystitis without obstruction: Secondary | ICD-10-CM

## 2015-01-28 DIAGNOSIS — Z23 Encounter for immunization: Secondary | ICD-10-CM

## 2015-01-28 DIAGNOSIS — I2699 Other pulmonary embolism without acute cor pulmonale: Secondary | ICD-10-CM | POA: Diagnosis not present

## 2015-01-28 DIAGNOSIS — O99345 Other mental disorders complicating the puerperium: Secondary | ICD-10-CM

## 2015-01-28 DIAGNOSIS — O24313 Unspecified pre-existing diabetes mellitus in pregnancy, third trimester: Secondary | ICD-10-CM

## 2015-01-28 LAB — GLUCOSE, CAPILLARY: Glucose-Capillary: 86 mg/dL (ref 65–99)

## 2015-01-28 LAB — POCT GLYCOSYLATED HEMOGLOBIN (HGB A1C): Hemoglobin A1C: 4.9

## 2015-01-28 NOTE — Progress Notes (Signed)
   Subjective:    Patient ID: [facility], female    DOB: 10/01/1970, 44 y.o.   MRN: [id number]  HPI  Kim [name] is a 44 year old female with post-partum depression, h/o PE, gestational diabetesand  who presents today for follow-up. Please see assessment & plan for documentation of chronic medical problems.     Review of Systems  Constitutional: Negative for appetite change and unexpected weight change.  Endocrine: Negative for polydipsia and polyuria.       Objective:   Physical Exam Constitutional: Overweight, middle aged Hispanic female. No distress.  Head: Normocephalic and atraumatic.  Eyes: Conjunctivae are normal. Pupils are 4 mm, direct, consensual, near.  Cardiovascular: Normal rate, regular rhythm and normal heart sounds.  No gallop, friction rub, murmur heard. Pulmonary/Chest: Effort normal. No respiratory distress. No wheezes, rales.  Abdominal: Soft. Bowel sounds are normal. No distension. No tenderness.  Neurological: Alert and oriented to person, place, and time.  Coordination normal.  Skin: Warm and dry. Not diaphoretic.  Extremities: No tibial edema. Psychiatric: Affect mobile and congruent with thought content.         Assessment & Plan:

## 2015-01-28 NOTE — Patient Instructions (Addendum)
Voy a hablar con la Sra. Kim [name] sobre el psicologico.  Su nivel de azcar fue 86.

## 2015-01-28 NOTE — Assessment & Plan Note (Addendum)
Symptoms have improved on Zoloft 100mg  though she describes intermittent irritability while dealing with her children. No thoughts of wanting to hurt them or access to firearms at home. Strained relationship with her oldest daughter who is age 45 and leaves outside the house but has recently started talking to her. Some changes in activity level with intermittent fatigue from her stress.  Offered to try increasing her medication though she declined. Agreeable to following up with therapy/counseling so provided her with instructions in Spanish for [place]. Will also refer to Social Work to help her secure follow-up.

## 2015-01-28 NOTE — Assessment & Plan Note (Addendum)
Symptoms [pleuritic chest pain] improved on coumadin with plan to discontinue at the end of this month in preparation for surgery. No swelling noted in extremities on exam.

## 2015-01-28 NOTE — Assessment & Plan Note (Addendum)
Concerned about her family history of diabetes and that she carried this diagnosis. Denied any polyuria, polydipsia, changes in weight though reports feeling like she has intermittent increases in her appetite. Weight is 172.7 lbs today, stable from 173 at her last appointment. Will check POC A1c today per her preference along with lipid panel since it's been 2 years with mildly elevated triglycerides 157, low HDL 37.  ADDENDUM 01/28/2015  9:39 AM:  A1c stable from 1 year prior. POC glucose reassuring. Review with her at PCP follow-up next month.  ADDENDUM 01/30/2015  10:04 AM:  Per 2013 AHA/ACC ASCVD risk calculator, 10-year risk is 0.5% and 0.5% with optimal risk factors. Stain therapy is thus not indicated.

## 2015-01-28 NOTE — Assessment & Plan Note (Signed)
Pain well controlled on medication and avoiding spicy and greasy foods. Scheduled for elective surgery.

## 2015-01-29 LAB — LIPID PANEL
Chol/HDL Ratio: 3.6 ratio units (ref 0.0–4.4)
Cholesterol, Total: 171 mg/dL (ref 100–199)
HDL: 47 mg/dL (ref >39)
LDL Calculated: 108 mg/dL — ABNORMAL HIGH (ref 0–99)
Triglycerides: 81 mg/dL (ref 0–149)
VLDL Cholesterol Cal: 16 mg/dL (ref 5–40)

## 2015-01-31 NOTE — Progress Notes (Signed)
Internal Medicine Clinic Attending  Case discussed with Dr. Patel,Rushil soon after the resident saw the patient.  We reviewed the resident's history and exam and pertinent patient test results.  I agree with the assessment, diagnosis, and plan of care documented in the resident's note. 

## 2015-02-02 ENCOUNTER — Ambulatory Visit (INDEPENDENT_AMBULATORY_CARE_PROVIDER_SITE_OTHER): Payer: 59 | Admitting: Pharmacist

## 2015-02-02 DIAGNOSIS — I2699 Other pulmonary embolism without acute cor pulmonale: Secondary | ICD-10-CM | POA: Diagnosis not present

## 2015-02-02 DIAGNOSIS — Z7901 Long term (current) use of anticoagulants: Secondary | ICD-10-CM | POA: Diagnosis not present

## 2015-02-02 LAB — POCT INR: INR: 2.4

## 2015-02-02 MED ORDER — WARFARIN SODIUM 7.5 MG PO TABS: ORAL_TABLET | ORAL | Status: DC

## 2015-02-02 NOTE — Patient Instructions (Signed)
Patient instructed to take medications as defined in the Anti-coagulation Track section of this encounter.  Patient instructed to take today's dose.  Patient verbalized understanding of these instructions.  Patient had instructions interpreted to her by audio-video interpretation service provided by [facility] video interpreting service Stanton Kidney).

## 2015-02-02 NOTE — Progress Notes (Signed)
Anti-Coagulation Progress Note  [facility] [name] is a 44 y.o. female who is currently on an anti-coagulation regimen.    RECENT RESULTS: Recent results are below, the most recent result is correlated with a dose of 41.25 mg. per week:  Of note:  Patient states that she was advised by her OB-GYN physician at 1 month follow up--that she should CONTINUE warfarin for SIX (6) months as opposed to the 3 month duration in CHEST for "known-provoking cause (childbirth/delivery)". Patient has subsequently she states through her interpreter (and upon review of previous notes of Dr. Genene Churn) had cholelithiasis and she cites that a surgeon she saw advised that being on warfarin would be a consideration for potential of surgery.  We need to contact the surgeon and with the advice of the surgeon and the "advice and consent" of the patient make a determination by weighing risks vs. Benefits--determine how we should proceed. At present time, I am continuing the anticoagulation based upon the information patient has provided from her OB-GYN physician. We need to determine which she would benefit "most" from:  Continuing warfarin without interruption; possible interruption of warfarin--bridge onto Lovenox---stop warfarin before any planned general abdominal surgery relative to her gallbladder disease--and resume warfarin with Lovenox bridge therapy post-op until such time INR would be found to be therapeutic; OR--discontinuing warfarin and proceeding with surgery. General abdominal surgery will be a "risk-factor" post-operatively for potential of recurrence of VTE.  Lab Results  Component Value Date   INR 2.40 02/02/2015   INR 1.90 01/19/2015   INR 2.27* 01/04/2015    ANTI-COAG DOSE: Anticoagulation Dose Instructions as of 02/02/2015      Dorene Grebe Tue Wed Thu Fri Sat   New Dose 3.75 mg 7.5 mg 7.5 mg 7.5 mg 7.5 mg 3.75 mg 3.75 mg       ANTICOAG SUMMARY: Anticoagulation Episode Summary    Current INR goal  2.0-3.0  Next INR check 03/02/2015  INR from last check 2.40 (02/02/2015)  Weekly max dose   Target end date   [facility]  Preferred lab   Send INR reminders to    Indications  Pulmonary embolism [facility]) [I26.99]        Comments       Anticoagulation Care Providers    Provider Role Specialty Phone number   Dellia Nims, MD Responsible Internal Medicine [phone]      ANTICOAG TODAY: Anticoagulation Summary as of 02/02/2015    INR goal 2.0-3.0  Selected INR 2.40 (02/02/2015)  Next INR check 03/02/2015  Target end date    Indications  Pulmonary embolism ([place]) [I26.99]      Anticoagulation Episode Summary    [facility]   Preferred lab    Send INR reminders to    Comments     Anticoagulation Care Providers    Provider Role Specialty Phone number   Dellia Nims, MD Responsible Internal Medicine [phone]      PATIENT INSTRUCTIONS: Patient Instructions  Patient instructed to take medications as defined in the Anti-coagulation Track section of this encounter.  Patient instructed to take today's dose.  Patient verbalized understanding of these instructions.  Patient had instructions interpreted to her by audio-video interpretation service provided by [facility] video interpreting service Stanton Kidney).     FOLLOW-UP Return in 4 weeks (on 03/02/2015) for Follow up INR at [place], III Pharm.D., CACP

## 2015-02-04 ENCOUNTER — Telehealth: Payer: Self-pay | Admitting: Licensed Clinical Social Worker

## 2015-02-04 NOTE — Progress Notes (Signed)
INTERNAL MEDICINE TEACHING ATTENDING ADDENDUM - Deven Audi M.D  Duration- 6 months (3 more months), Indication- PE, INR- therapeutic. Agree with pharmacy recommendations as outlined in their note.

## 2015-02-04 NOTE — Telephone Encounter (Signed)
Kim [name] is a 44 year old female with post-partum depression.  Pt was referred to CSW for Spanish speaking behavioral health providers.  CSW utilized [employer] Associate Professor to obtain [employer].  CSW placed call to Kim [name] and provided the name of 2 female providers: Kim Curling LCSW, [place], [place] [address], [phone] and Kim Crochet PhD, [address] [place] [address], [phone].  CSW offered to schedule appointment on behalf of pt, once pt chose a provider.  Pt declined, stating she is able to schedule for herself.  CSW encouraged pt to contact this worker should pt have difficulty contacting either of these providers as CSW can broaden the scope of providers.  CSW provided Kim [name] with contact information and work hours.  Pt instructed to contact CSW with name and CSW will return call utilizing a telephonic interpreter.  Pt denied need for an immediate appointment or need to utilize an agency with walk-in hours.  Kim [name] denied add'l social work needs at this time.  Pt aware CSW is available to assist as needed.

## 2015-02-23 ENCOUNTER — Encounter: Payer: Self-pay | Admitting: Internal Medicine

## 2015-02-23 DIAGNOSIS — I2699 Other pulmonary embolism without acute cor pulmonale: Secondary | ICD-10-CM

## 2015-02-23 NOTE — Progress Notes (Signed)
Call from [facility] hepatobiliary surgeon with question about duration of warfarin tx. Reviewed overview in PL - warfarin to end 02/09/2015, 01/28/2015 note from Dr Posey Pronto "with plan to discontinue at the end of this month in preparation for surgery", and Dr Gladstone Pih note - risks vs benefits and need to contact the surgeon. Surgeon stated he agreed with 3 moths since post-partum and since Dr Serita Grit note and overview concurred with 3 months / end of Oct, that is what I confirmed.

## 2015-02-23 NOTE — Assessment & Plan Note (Signed)
Call from [facility] hepatobiliary surgeon with question about duration of warfarin tx. Reviewed overview in PL - warfarin to end 02/09/2015, 01/28/2015 note from Dr Posey Pronto "with plan to discontinue at the end of this month in preparation for surgery", and Dr Gladstone Pih note - risks vs benefits and need to contact the surgeon. Surgeon stated he agreed with 3 moths since post-partum and since Dr Serita Grit note and overview concurred with 3 months / end of Oct, that is what I confirmed.

## 2015-02-24 DIAGNOSIS — Z86711 Personal history of pulmonary embolism: Secondary | ICD-10-CM | POA: Insufficient documentation

## 2015-02-24 DIAGNOSIS — K8050 Calculus of bile duct without cholangitis or cholecystitis without obstruction: Secondary | ICD-10-CM | POA: Insufficient documentation

## 2015-02-24 NOTE — Progress Notes (Signed)
Patient ID: [facility] Kim [name], female   DOB: [birth year], 44 y.o.   MRN: [id number]  Yes, that's correct. I will be more clear next time.

## 2015-03-02 ENCOUNTER — Ambulatory Visit (INDEPENDENT_AMBULATORY_CARE_PROVIDER_SITE_OTHER): Payer: 59 | Admitting: Pharmacist

## 2015-03-02 DIAGNOSIS — I2699 Other pulmonary embolism without acute cor pulmonale: Secondary | ICD-10-CM | POA: Diagnosis not present

## 2015-03-02 DIAGNOSIS — Z7901 Long term (current) use of anticoagulants: Secondary | ICD-10-CM | POA: Diagnosis not present

## 2015-03-02 LAB — POCT INR: INR: 2.6

## 2015-03-02 NOTE — Patient Instructions (Signed)
Patient instructed to take medications as defined in the Anti-coagulation Track section of this encounter.  Patient instructed to take today's dose.  Patient verbalized understanding of these instructions.  The following instructions were translated to the patient by [place] translator present here in the [facility]:  1. Five (5) days BEFORE your planned procedure-STOP taking your warfarin/Coumadin (LAST DOSE of warfarin will be [facility] November 20th, 2016) 2. Four (4) days BEFORE your planned procedure-do NOT take warfarin/Coumadin. 3. Three (3) days BEFORE your planned procedure-do NOT take warfarin/Coumadin.  On this day (Tuesday March 10, 2015)-you will begin giving yourself the low-molecular weight heparin by syringe that you should have available. Give the first injection at 8:00PM.  4. Two (2) days BEFORE your planned procedure-do NOT take warfarin/Coumadin. Continue with low-molecular weight heparin injections at 8:00AM and 8:00PM.   5. One (1) day BEFORE your planned procedure-do NOT take warfarin/Coumadin. Give this injection at 8:00AM. This is your LAST DOSE BEFORE YOUR PROCEDURE tomorrow.  6. DAY OF PROCEDURE Friday, November 25th, 2016). NO WARFARIN. NO low-molecular-weight-heparin.  7. Day 1 after procedure-if your Physician or Surgeon indicates it is appropriate-commence your warfarin the evening after your procedure (Saturday, November 26th, 2016)-using your warfarin dosing instructions last provided by Dr. Elie Confer. You will also START BACK on the low-molecular-weight-heparin syringe(s) that you should have available. Give your FIRST INJECTION at [facility] on SATURDAY November 26th, 2016.  8. Continue giving yourself the low-molecular-weight-heparin injections AND taking your warfarin as instructed on your last instructions provided when you were seen in clinic.  9. COME TO CLINIC on MONDAY November 28th, 2016. 10. [place] [phone]) to speak  with either Dr. Elie Confer or Dr. Maudie Mercury.

## 2015-03-02 NOTE — Progress Notes (Signed)
Anti-Coagulation Progress Note  [facility] Kim [name] is a 44 y.o. female who is currently on an anti-coagulation regimen.    RECENT RESULTS: Recent results are below, the most recent result is correlated with a dose of 41.25 mg. per week: Lab Results  Component Value Date   INR 2.60 03/02/2015   INR 2.40 02/02/2015   INR 1.90 01/19/2015    ANTI-COAG DOSE: Anticoagulation Dose Instructions as of 03/02/2015      Kim [name] Tue Wed Thu Fri Sat   New Dose 7.5 mg 3.75 mg 7.5 mg 3.75 mg 7.5 mg 3.75 mg 7.5 mg       ANTICOAG SUMMARY: Anticoagulation Episode Summary    Current INR goal 2.0-3.0  Next INR check 03/16/2015  INR from last check 2.60 (03/02/2015)  Weekly max dose   Target end date   [facility] check [facility]  Preferred lab   Send INR reminders to    Indications  Pulmonary embolism [facility]) [I26.99]        Comments       Anticoagulation Care Providers    Provider Role Specialty Phone number   Dellia Nims, MD Responsible Internal Medicine [phone]      ANTICOAG TODAY: Anticoagulation Summary as of 03/02/2015    INR goal 2.0-3.0  Selected INR 2.60 (03/02/2015)  Next INR check 03/16/2015  Target end date    Indications  Pulmonary embolism ([place]) [I26.99]      Anticoagulation Episode Summary    IN[facility] check [facility]   Preferred lab    Send INR reminders to    Comments     Anticoagulation Care Providers    Provider Role Specialty Phone number   Dellia Nims, MD Responsible Internal Medicine [phone]      PATIENT INSTRUCTIONS: Patient Instructions  Patient instructed to take medications as defined in the Anti-coagulation Track section of this encounter.  Patient instructed to take today's dose.  Patient verbalized understanding of these instructions.  The following instructions were translated to the patient by [place] translator present here in the [facility]:  1. Five (5) days BEFORE your planned procedure-STOP taking your  warfarin/Coumadin (LAST DOSE of warfarin will be [facility] November 20th, 2016) 2. Four (4) days BEFORE your planned procedure-do NOT take warfarin/Coumadin. 3. Three (3) days BEFORE your planned procedure-do NOT take warfarin/Coumadin.  On this day (Tuesday March 10, 2015)-you will begin giving yourself the low-molecular weight heparin by syringe that you should have available. Give the first injection at 8:00PM.  4. Two (2) days BEFORE your planned procedure-do NOT take warfarin/Coumadin. Continue with low-molecular weight heparin injections at 8:00AM and 8:00PM.   5. One (1) day BEFORE your planned procedure-do NOT take warfarin/Coumadin. Give this injection at 8:00AM. This is your LAST DOSE BEFORE YOUR PROCEDURE tomorrow.  6. DAY OF PROCEDURE Friday, November 25th, 2016). NO WARFARIN. NO low-molecular-weight-heparin.  7. Day 1 after procedure-if your Physician or Surgeon indicates it is appropriate-commence your warfarin the evening after your procedure (Saturday, November 26th, 2016)-using your warfarin dosing instructions last provided by Dr. Elie Confer. You will also START BACK on the low-molecular-weight-heparin syringe(s) that you should have available. Give your FIRST INJECTION at [facility] on SATURDAY November 26th, 2016.  8. Continue giving yourself the low-molecular-weight-heparin injections AND taking your warfarin as instructed on your last instructions provided when you were seen in clinic.  9. COME TO CLINIC on MONDAY November 28th, 2016. 10. SHOULD YOUR PROCEDURE BE CANCELLED-YOU MUST CALL THE CLINIC [phone]) to speak with either  Dr. Elie Confer or Dr. Maudie Mercury.      FOLLOW-UP Return in about 2 weeks (around 03/16/2015) for Follow up INR at 3:30PM.  Jorene Guest, III Pharm.D., CACP

## 2015-03-09 ENCOUNTER — Encounter: Payer: 59 | Admitting: Internal Medicine

## 2015-03-16 ENCOUNTER — Ambulatory Visit: Payer: 59

## 2015-04-27 ENCOUNTER — Encounter: Payer: Self-pay | Admitting: Internal Medicine

## 2015-05-21 ENCOUNTER — Ambulatory Visit (INDEPENDENT_AMBULATORY_CARE_PROVIDER_SITE_OTHER): Payer: BLUE CROSS/BLUE SHIELD | Admitting: Urgent Care

## 2015-05-21 VITALS — BP 110/64 | HR 70 | Temp 98.3°F | Resp 16 | Ht 65.0 in | Wt 185.4 lb

## 2015-05-21 DIAGNOSIS — Z9049 Acquired absence of other specified parts of digestive tract: Secondary | ICD-10-CM

## 2015-05-21 DIAGNOSIS — R1013 Epigastric pain: Secondary | ICD-10-CM | POA: Diagnosis not present

## 2015-05-21 DIAGNOSIS — Z86711 Personal history of pulmonary embolism: Secondary | ICD-10-CM

## 2015-05-21 DIAGNOSIS — R8290 Unspecified abnormal findings in urine: Secondary | ICD-10-CM

## 2015-05-21 DIAGNOSIS — Z8719 Personal history of other diseases of the digestive system: Secondary | ICD-10-CM | POA: Diagnosis not present

## 2015-05-21 LAB — POCT CBC
Granulocyte percent: 58.7 %G (ref 37–80)
HCT, POC: 42.5 % (ref 37.7–47.9)
Hemoglobin: 14.8 g/dL (ref 12.2–16.2)
Lymph, poc: 3.3 (ref 0.6–3.4)
MCH, POC: 31.7 pg — AB (ref 27–31.2)
MCHC: 34.8 g/dL (ref 31.8–35.4)
MCV: 91 fL (ref 80–97)
MID (cbc): 0.6 (ref 0–0.9)
MPV: 7.2 fL (ref 0–99.8)
POC Granulocyte: 5.6 (ref 2–6.9)
POC LYMPH PERCENT: 34.8 %L (ref 10–50)
POC MID %: 6.5 %M (ref 0–12)
Platelet Count, POC: 252 10*3/uL (ref 142–424)
RBC: 4.67 M/uL (ref 4.04–5.48)
RDW, POC: 13.6 %
WBC: 9.6 10*3/uL (ref 4.6–10.2)

## 2015-05-21 LAB — POCT WET + KOH PREP
Trich by wet prep: ABSENT
Yeast by KOH: ABSENT
Yeast by wet prep: ABSENT

## 2015-05-21 LAB — POCT URINALYSIS DIP (MANUAL ENTRY)
Bilirubin, UA: NEGATIVE
Blood, UA: NEGATIVE
Glucose, UA: NEGATIVE
Ketones, POC UA: NEGATIVE
Nitrite, UA: POSITIVE — AB
Protein Ur, POC: NEGATIVE
Spec Grav, UA: 1.025
Urobilinogen, UA: 0.2
pH, UA: 5.5

## 2015-05-21 LAB — COMPREHENSIVE METABOLIC PANEL
ALT: 120 U/L — ABNORMAL HIGH (ref 6–29)
AST: 244 U/L — ABNORMAL HIGH (ref 10–30)
Albumin: 4.3 g/dL (ref 3.6–5.1)
Alkaline Phosphatase: 97 U/L (ref 33–115)
BUN: 12 mg/dL (ref 7–25)
CO2: 22 mmol/L (ref 20–31)
Calcium: 8.9 mg/dL (ref 8.6–10.2)
Chloride: 104 mmol/L (ref 98–110)
Creat: 0.5 mg/dL (ref 0.50–1.10)
Glucose, Bld: 101 mg/dL — ABNORMAL HIGH (ref 65–99)
Potassium: 4.1 mmol/L (ref 3.5–5.3)
Sodium: 135 mmol/L (ref 135–146)
Total Bilirubin: 1 mg/dL (ref 0.2–1.2)
Total Protein: 7.6 g/dL (ref 6.1–8.1)

## 2015-05-21 LAB — POC MICROSCOPIC URINALYSIS (UMFC): Mucus: ABSENT

## 2015-05-21 LAB — POCT URINE PREGNANCY: Preg Test, Ur: NEGATIVE

## 2015-05-21 MED ORDER — RANITIDINE HCL 150 MG PO TABS: 150.0000 mg | ORAL_TABLET | Freq: Two times a day (BID) | ORAL | Status: DC

## 2015-05-21 MED ORDER — ONDANSETRON 8 MG PO TBDP: 8.0000 mg | ORAL_TABLET | Freq: Three times a day (TID) | ORAL | Status: DC | PRN

## 2015-05-21 NOTE — Patient Instructions (Addendum)
Enfermedad por reflujo gastroesofgico en los adultos (Gastroesophageal Reflux Disease, Adult) Normalmente, los alimentos descienden por el esfago y se depositan en el estmago para su digestin. Sin embargo, cuando una persona tiene enfermedad por reflujo gastroesofgico (ERGE), los alimentos y el cido estomacal regresan al esfago. Cuando esto ocurre, el esfago se irrita y se inflama. Con el tiempo, la ERGE puede provocar la formacin de pequeas perforaciones (lceras) en la mucosa del esfago.  CAUSAS Un problema del msculo que se encuentra entre el esfago y el estmago (esfnter esofgico inferior o EEI) es la causa de esta enfermedad. Por lo general, el esfnter esofgico inferior se cierra despus de que los alimentos pasan a travs del esfago hacia el estmago. Cuando el EEI est debilitado o no es normal, no se cierra correctamente, lo que permite el paso retrgrado de los alimentos y el cido estomacal al esfago. Algunas sustancias de la dieta, algunos medicamentos y ciertas enfermedades pueden debilitar este esfnter, entre ellos:  Consumo de tabaco.  Embarazo.  Hernia de hiato.  Consumo excesivo de alcohol.  Algunos alimentos y ciertas bebidas, como el caf, el chocolate, las cebollas y la menta. FACTORES DE RIESGO Es ms probable que esta afeccin se manifieste en:  Los personas con sobrepeso.  Las personas con trastornos del tejido conjuntivo.  Las personas que toman antiinflamatorios no esteroides (AINE). SNTOMAS Los sntomas de esta afeccin incluyen lo siguiente:  Acidez estomacal.  Dificultad o dolor al tragar.  Sensacin de tener un bulto en la garganta.  Sabor amargo en la boca.  Mal aliento.  Gran cantidad de saliva.  Malestar estomacal o meteorismo.  Flatulencias.  Dolor en el pecho.  Falta de aire o sibilancias.  Tos permanente (crnica) o tos nocturna.  Desgaste el esmalte dental.  Prdida de peso. El dolor en el pecho puede deberse a  muchas afecciones diferentes. Consulte al mdico si tiene dolor en el pecho. DIAGNSTICO El mdico le har una historia clnica y un examen fsico. Para determinar si la ERGE es leve o grave, el mdico tambin puede controlar la respuesta al tratamiento. Tambin pueden hacerle otros estudios, por ejemplo:  Una endoscopia para examinar el estmago y el esfago con una pequea cmara.  Un estudio que determina el nivel de acidez en el esfago.  Un estudio que mide la presin que hay en el esfago.  Un estudio de deglucin de bario o un estudio modificado de deglucin de bario para mostrar la forma, el tamao y el funcionamiento del esfago. TRATAMIENTO El objetivo del tratamiento es aliviar los sntomas y evitar las complicaciones. El tratamiento de esta afeccin puede variar en funcin de la gravedad de los sntomas. El mdico podr indicar lo siguiente:  Cambios en la dieta.  Medicamentos.  Ciruga. INSTRUCCIONES PARA EL CUIDADO EN EL HOGAR Dieta  Siga la dieta que le haya recomendado el mdico, la cual puede incluir evitar alimentos y bebidas tales como:  Caf y t (con o sin cafena).  Bebidas que contengan alcohol.  Bebidas energizantes y deportivas.  Gaseosas o refrescos.  Chocolate y cacao.  Menta y esencias de menta.  Ajo y cebollas.  Rbano picante.  Alimentos muy condimentados y cidos, entre ellos, pimientos, chile en polvo, curry en polvo, vinagre, salsas picantes y salsa barbacoa.  Frutas ctricas y sus jugos, como naranjas, limones y limas.  Alimentos a base de tomates, como salsa roja, chile, salsa y pizza con salsa roja.  Alimentos fritos y grasos, como rosquillas, papas fritas y aderezos con alto   contenido de grasa.  Carnes con alto contenido de grasa, como hot dogs y cortes grasos de carnes rojas y blancas, por ejemplo, filetes de entrecot, salchicha, jamn y tocino.  Productos lcteos con alto contenido de grasa, como leche entera, mantequilla y  queso crema.  Haga comidas pequeas y frecuentes durante el da en lugar de comidas abundantes.  Evite beber mucho lquido con las comidas.  No coma durante las 2 o 3horas previas a la hora de acostarse.  No se acueste inmediatamente despus de comer.  No haga actividad fsica enseguida despus de comer. Instrucciones generales  Est atento a cualquier cambio en los sntomas.  Tome los medicamentos de venta libre y los recetados solamente como se lo haya indicado el mdico. No tome aspirina, ibuprofeno ni otros antiinflamatorios no esteroides (AINE), a menos que se lo haya indicado el mdico.  No consuma ningn producto que contenga tabaco, lo que incluye cigarrillos, tabaco de mascar y cigarrillos electrnicos. Si necesita ayuda para dejar de fumar, consulte al mdico.  Use ropas sueltas. No use prendas ajustadas alrededor de la cintura que ejerzan presin en el abdomen.  Levante (eleve) 6pulgadas (15centmetros) la cabecera de la cama.  Trate de reducir el nivel de estrs con actividades como el yoga o la meditacin. Si necesita ayuda para reducir el nivel de estrs, consulte al mdico.  Si tiene sobrepeso, adelgace hasta alcanzar un peso saludable. Hable con el mdico acerca de su peso ideal y pdale asesoramiento en cuanto a la dieta que debe seguir para poder alcanzarlo.  Concurra a todas las visitas de control como se lo haya indicado el mdico. Esto es importante. SOLICITE ATENCIN MDICA SI:  Aparecen nuevos sntomas.  Baja de peso sin causa aparente.  Tiene dificultad para tragar o siente dolor al hacerlo.  Tiene sibilancias o tos persistente.  Los sntomas no mejoran con el tratamiento.  Tiene la voz ronca. SOLICITE ATENCIN MDICA DE INMEDIATO SI:  Tiene dolor en los brazos, el cuello, los maxilares, la dentadura o la espalda.  Transpira, se marea o tiene sensacin de desvanecimiento.  Siente falta de aire o dolor en el pecho.  Vomita y el vmito es  parecido a la sangre o a los granos de caf.  Se desmaya.  Las heces son sanguinolentas o de color negro.  No puede tragar, beber o comer.   Esta informacin no tiene como fin reemplazar el consejo del mdico. Asegrese de hacerle al mdico cualquier pregunta que tenga.   Document Released: 01/12/2005 Document Revised: 12/24/2014 Elsevier Interactive Patient Education 2016 Elsevier Inc.   

## 2015-05-21 NOTE — Progress Notes (Signed)
MRN: [id number] DOB: [birth year]  Subjective:   [facility] [name] is a 45 y.o. female presenting for chief complaint of Abdominal Pain and Nausea  Reports 1 day history of epigastric pain, with nausea. Patient tried Rolaids with some relief. States that she has had GERD in the past. However, she eats minimal GERD causing foods including spicy food only some times. She denies fever, chest pain, shob, diarrhea, bloody stools, dysuria, hematuria, urinary frequency. Of note, patient had a pulmonary embolism 10/2014, was on anticoagulation until she had to have a cholecystectomy in 02/2015. She was told to stop warfarin at that time. Also, patient was taking ibuprofen daily for 1 month after her cholecystectomy. She is no longer taking this however for ~1 month.  Danetta has a current medication list which includes the following prescription(s): ibuprofen and sertraline. Also has No Known Allergies.  Tomasa  has a past medical history of Abnormal Pap smear; Asthma; Gestational diabetes; Cancer [facility]); and Renal insufficiency. Also  has past surgical history that includes No past surgeries.  Objective:   Vitals: BP 110/64 mmHg  Pulse 70  Temp(Src) 98.3 F (36.8 C) (Oral)  Resp 16  Ht 5\' 5"  (1.651 m)  Wt 185 lb 6.4 oz (84.097 kg)  BMI 30.85 kg/m2  SpO2 98%  LMP 04/26/2015  Breastfeeding? Yes  Physical Exam  Constitutional: She is oriented to person, place, and time. She appears well-developed and well-nourished.  HENT:  Mouth/Throat: Oropharynx is clear and moist.  Eyes: No scleral icterus.  Cardiovascular: Normal rate, regular rhythm and intact distal pulses.   Pulmonary/Chest: No respiratory distress. She has no wheezes. She has no rales.  Abdominal: Soft. Bowel sounds are normal. She exhibits no distension and no mass. There is tenderness (epigastric, right-sided CVA tenderness).  Musculoskeletal: She exhibits no edema.  Neurological: She is alert and oriented to person,  place, and time.  Skin: Skin is warm and dry.   Results for orders placed or performed in visit on 05/21/15 (from the past 24 hour(s))  POCT urinalysis dipstick     Status: Abnormal   Collection Time: 05/21/15 11:41 AM  Result Value Ref Range   Color, UA yellow yellow   Clarity, UA clear clear   Glucose, UA negative negative   Bilirubin, UA negative negative   Ketones, POC UA negative negative   Spec Grav, UA 1.025    Blood, UA negative negative   pH, UA 5.5    Protein Ur, POC negative negative   Urobilinogen, UA 0.2    Nitrite, UA Positive (A) Negative   Leukocytes, UA small (1+) (A) Negative  POCT urine pregnancy     Status: None   Collection Time: 05/21/15 11:41 AM  Result Value Ref Range   Preg Test, Ur Negative Negative  POCT CBC     Status: Abnormal   Collection Time: 05/21/15 11:42 AM  Result Value Ref Range   WBC 9.6 4.6 - 10.2 K/uL   Lymph, poc 3.3 0.6 - 3.4   POC LYMPH PERCENT 34.8 10 - 50 %L   MID (cbc) 0.6 0 - 0.9   POC MID % 6.5 0 - 12 %M   POC Granulocyte 5.6 2 - 6.9   Granulocyte percent 58.7 37 - 80 %G   RBC 4.67 4.04 - 5.48 M/uL   Hemoglobin 14.8 12.2 - 16.2 g/dL   HCT, POC 42.5 37.7 - 47.9 %   MCV 91.0 80 - 97 fL   MCH, POC 31.7 (A) 27 -  31.2 pg   MCHC 34.8 31.8 - 35.4 g/dL   RDW, POC 13.6 %   Platelet Count, POC 252 142 - 424 K/uL   MPV 7.2 0 - 99.8 fL  POCT Microscopic Urinalysis ([facility])     Status: Abnormal   Collection Time: 05/21/15 11:42 AM  Result Value Ref Range   WBC,UR,HPF,POC Few (A) None WBC/hpf   RBC,UR,HPF,POC Few (A) None RBC/hpf   Bacteria Many (A) None, Too numerous to count   Mucus Absent Absent   Epithelial Cells, UR Per Microscopy Many (A) None, Too numerous to count cells/hpf  POCT Wet + KOH Prep     Status: Abnormal   Collection Time: 05/21/15 12:21 PM  Result Value Ref Range   Yeast by KOH Absent Present, Absent   Yeast by wet prep Absent Present, Absent   WBC by wet prep Few None, Few, Too numerous to count   Clue Cells  Wet Prep HPF POC None None, Too numerous to count   Trich by wet prep Absent Present, Absent   Bacteria Wet Prep HPF POC Few None, Few, Too numerous to count   Epithelial Cells By [employer] Pref ([facility]) Few None, Few, Too numerous to count   RBC,UR,HPF,POC Few (A) None RBC/hpf   Assessment and Plan :   1. Abdominal pain, epigastric 2. History of pulmonary embolism 3. History of cholecystectomy 4. History of gastroesophageal reflux (GERD) 5. Abnormal urinalysis 6. Malodorous urine - Patient's labs are reassuring for now but unclear etiology. We will start Zantac to address GERD. Urine culture is pending. Consider antibiotic course if it returns positive. Zofran for nausea in the meantime. Patient agreed to rtc if worsening symptoms develop as discussed in clinic.  Jaynee Eagles, PA-C [facility] [phone] 05/21/2015 10:33 AM

## 2015-05-23 ENCOUNTER — Telehealth: Payer: Self-pay | Admitting: Urgent Care

## 2015-05-23 DIAGNOSIS — N3090 Cystitis, unspecified without hematuria: Secondary | ICD-10-CM

## 2015-05-23 LAB — URINE CULTURE: Colony Count: >=100000

## 2015-05-23 MED ORDER — NITROFURANTOIN MONOHYD MACRO 100 MG PO CAPS: 100.0000 mg | ORAL_CAPSULE | Freq: Two times a day (BID) | ORAL | Status: DC

## 2015-05-23 NOTE — Telephone Encounter (Signed)
Spoke with patient, she feels better but due to her urine culture agreed to start Cipro. The concern is that she may develop pyelonephritis. Patient is to rtc if symptoms return or worsen.

## 2015-05-25 ENCOUNTER — Ambulatory Visit (INDEPENDENT_AMBULATORY_CARE_PROVIDER_SITE_OTHER): Payer: BLUE CROSS/BLUE SHIELD | Admitting: Internal Medicine

## 2015-05-25 ENCOUNTER — Encounter: Payer: Self-pay | Admitting: Internal Medicine

## 2015-05-25 VITALS — BP 116/75 | HR 66 | Temp 98.8°F | Ht 65.0 in | Wt 185.3 lb

## 2015-05-25 DIAGNOSIS — O99345 Other mental disorders complicating the puerperium: Secondary | ICD-10-CM

## 2015-05-25 DIAGNOSIS — N390 Urinary tract infection, site not specified: Secondary | ICD-10-CM | POA: Diagnosis not present

## 2015-05-25 DIAGNOSIS — B9689 Other specified bacterial agents as the cause of diseases classified elsewhere: Secondary | ICD-10-CM | POA: Diagnosis not present

## 2015-05-25 DIAGNOSIS — R102 Pelvic and perineal pain: Secondary | ICD-10-CM | POA: Diagnosis not present

## 2015-05-25 DIAGNOSIS — F53 Postpartum depression: Secondary | ICD-10-CM

## 2015-05-25 DIAGNOSIS — R109 Unspecified abdominal pain: Secondary | ICD-10-CM

## 2015-05-25 DIAGNOSIS — N3000 Acute cystitis without hematuria: Secondary | ICD-10-CM

## 2015-05-25 DIAGNOSIS — Z8632 Personal history of gestational diabetes: Secondary | ICD-10-CM

## 2015-05-25 DIAGNOSIS — R309 Painful micturition, unspecified: Secondary | ICD-10-CM

## 2015-05-25 NOTE — Assessment & Plan Note (Signed)
Cont and finish treatment with macrobid x7 days.

## 2015-05-25 NOTE — Assessment & Plan Note (Addendum)
Her flank pain may not be from UTI/pyelo as she has no CVA tenderness. I believe her back pain/spasm may be causing the flank discomfort. Asked to do back stretching to see if that helps the flank/left lateral pain.   Will see if this improves with the stretching + abx that she is taking for her UTI.

## 2015-05-25 NOTE — Assessment & Plan Note (Signed)
States she is doing better on zoloft 100mg  daily now. No issues.

## 2015-05-25 NOTE — Patient Instructions (Signed)
You are doing great. You don't have diabetes currently. You should however eat healthy and exercise to avoid diabetes in the future.  Do some light exercises and then increase intensity slowly.  Do back stretching.  If your pelvic pain does not improve with the antibiotics, let us know.  ------------   Lo ests haciendo genial. Usted no tiene diabetes actualmente. Sin embargo, debe comer sano y Field seismologist ejercicio para evitar la diabetes en el futuro.  Hacer algunos ejercicios de luz y luego aumentar la intensidad lentamente.  Hacer el estiramiento de espalda.  Si su dolor plvico no mejora con los antibiticos, hganoslo saber.

## 2015-05-25 NOTE — Progress Notes (Signed)
   Subjective:    Patient ID: [facility], female    DOB: 03-17-1971, 45 y.o.   MRN: [id number]  HPI 45 yo female with hx of gestation diabetes, hx of PE post partum completed coumadin course, post partum depression, here to discuss her lab result of hgba1c, also stating having some left sided flank/back pain.   She was seen in ED for left flank pain, UA showed nitrites+bacteria. Was prescribed nitrofurantoin x7 days, took 2 days so far. Not much improvement of the pain. Also states she has some back pain bilaterally.  Was also having nausea and GERD symptoms, was given ranitidine for that. No longer feels nauseous.   S/p cholecystectomy, no pain on the right side.   Doing well in terms of post partum depression, on zoloft 100mg  daily.    Review of Systems  Constitutional: Negative for fever, chills and fatigue.  HENT: Negative for congestion and sore throat.   Eyes: Negative.   Respiratory: Negative for cough and shortness of breath.   Cardiovascular: Negative for chest pain, palpitations and leg swelling.  Gastrointestinal: Negative for nausea, vomiting, abdominal pain, diarrhea and blood in stool.  Endocrine: Negative for polyuria.  Genitourinary: Positive for flank pain and pelvic pain. Negative for dysuria and urgency.  Musculoskeletal: Positive for back pain.  Skin: Negative.   Neurological: Negative for dizziness and numbness.  Psychiatric/Behavioral: Negative for suicidal ideas, hallucinations and agitation.       Objective:   Physical Exam  Constitutional: She is oriented to person, place, and time. She appears well-developed and well-nourished.  Accompanied by her 2 children. Talked using Spanish interpretator.   HENT:  Head: Normocephalic and atraumatic.  Mouth/Throat: Oropharynx is clear and moist. No oropharyngeal exudate.  Eyes: Conjunctivae and EOM are normal. Pupils are equal, round, and reactive to light. Right eye exhibits no discharge. Left eye  exhibits no discharge. No scleral icterus.  Neck: Normal range of motion.  Cardiovascular: Normal rate, regular rhythm and normal heart sounds.  Exam reveals no gallop and no friction rub.   No murmur heard. Pulmonary/Chest: Effort normal and breath sounds normal. No respiratory distress. She has no wheezes. She has no rales. She exhibits no tenderness.  Abdominal: Soft. Bowel sounds are normal. She exhibits no distension. There is no tenderness. There is no rebound.  Has some very mild tenderness on lateral upper abdomen/flank area. No CVA tenderness.  cholecystectomy trocar sites well healed. No drainage or tenderness.   Musculoskeletal: Normal range of motion. She exhibits no edema or tenderness.  Has some paraspinal muscle tenderness on bilateral mid back. No CVA tenderness  Neurological: She is alert and oriented to person, place, and time. No cranial nerve deficit. Coordination normal.  Skin: Skin is warm.  Psychiatric: She has a normal mood and affect. Her behavior is normal.     Filed Vitals:   05/25/15 1522  BP: 116/75  Pulse: 66  Temp: 98.8 F (37.1 C)        Assessment & Plan:  See problem based a&p.

## 2015-05-25 NOTE — Assessment & Plan Note (Signed)
Discussed lab results from last hgba1c by Dr. Posey Pronto showing 4.9.   Told her that she currently does not have diabetes but having gestation diabetes if a risk factor for future diabetes. Asked her to do lifestyle modifications to stay health and avoid diabetes.

## 2015-05-25 NOTE — Assessment & Plan Note (Addendum)
Has some pain with urination. Recently was diagnosed with UTI by ED. Currently taking nitrofurantoin 7 day course, only took for 2 days. Asked her to monitor for improvement and to let us know if it does not improve. If fails to improve, may benefit from gyn follow up.

## 2015-05-26 ENCOUNTER — Encounter: Payer: Self-pay | Admitting: Internal Medicine

## 2015-05-27 NOTE — Progress Notes (Signed)
Internal Medicine Clinic Attending  Case discussed with Dr. Ahmed at the time of the visit.  We reviewed the resident's history and exam and pertinent patient test results.  I agree with the assessment, diagnosis, and plan of care documented in the resident's note. 

## 2015-06-10 ENCOUNTER — Ambulatory Visit (INDEPENDENT_AMBULATORY_CARE_PROVIDER_SITE_OTHER): Payer: BLUE CROSS/BLUE SHIELD | Admitting: Internal Medicine

## 2015-06-10 ENCOUNTER — Encounter: Payer: Self-pay | Admitting: Internal Medicine

## 2015-06-10 VITALS — BP 114/69 | HR 71 | Temp 97.9°F | Ht 65.0 in | Wt 189.4 lb

## 2015-06-10 DIAGNOSIS — J309 Allergic rhinitis, unspecified: Secondary | ICD-10-CM | POA: Diagnosis not present

## 2015-06-10 DIAGNOSIS — Z8619 Personal history of other infectious and parasitic diseases: Secondary | ICD-10-CM | POA: Diagnosis not present

## 2015-06-10 DIAGNOSIS — Z87440 Personal history of urinary (tract) infections: Secondary | ICD-10-CM

## 2015-06-10 DIAGNOSIS — J301 Allergic rhinitis due to pollen: Secondary | ICD-10-CM

## 2015-06-10 DIAGNOSIS — R300 Dysuria: Secondary | ICD-10-CM

## 2015-06-10 LAB — POCT URINALYSIS DIPSTICK
Bilirubin, UA: NEGATIVE
Glucose, UA: NEGATIVE
Ketones, UA: NEGATIVE
Leukocytes, UA: NEGATIVE
Nitrite, UA: NEGATIVE
Protein, UA: NEGATIVE
Spec Grav, UA: >=1.03
Urobilinogen, UA: 0.2
pH, UA: 6.5

## 2015-06-10 MED ORDER — LORATADINE 10 MG PO TABS: 10.0000 mg | ORAL_TABLET | Freq: Every day | ORAL | Status: DC

## 2015-06-10 MED ORDER — FLUTICASONE PROPIONATE 50 MCG/ACT NA SUSP: 2.0000 | Freq: Every day | NASAL | Status: DC

## 2015-06-10 NOTE — Patient Instructions (Signed)
1. Please schedule a follow up appointment in 4-6 weeks after you follow up with your gynecologist.   2. Please take all medications as previously prescribed with the following changes:  Start using Flonase twice daily in both nostrils for your allergies.   3. If you have worsening of your symptoms or new symptoms arise, please call the clinic [phone]), or go to the ER immediately if symptoms are severe.  You have done a great job in taking all your medications. Please continue to do this.

## 2015-06-10 NOTE — Progress Notes (Signed)
Subjective:   Patient ID: [facility] [name] female   DOB: Sep 09, 1970 45 y.o.   MRN: [id number]  HPI: Ms. Loring Freytes [name] is a 45 y.o. female w/ PMHx of Asthma, h/o Cervical CA, presents to the clinic today for an acute visit, says she is still having dysuria, flank pain, and rhinitis. She was seen in the urgent care on 05/21/15 for UTI and was treated with course of Macrobid. Cultures were positive for E. Coli, sensitive to E. Coli. She  finished the course of ABx but states she continues to have burning when she urinates. She feels it is better than before but still causing her some discomfort. She says the pain is improved if she drinks more water and her urine is clear. She denies any fevers, chills, nausea, or vomiting. She continues to describe low back pain and flank pain, starts in the spine and radiates into her flank. No CVA tenderness on exam. She denies radiation into her groin. No dark urine, no cloudy colored urine.   She also mentions watery eyes, itchy nose, sneezing, and congestion for the past several weeks.   Past Medical History  Diagnosis Date  . 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 Asthma     when pregnant/ once 6 yrs ago only time asthma attack the dr said  . Gestational diabetes   . Cancer ([place])   . Renal insufficiency    Current Outpatient Prescriptions  Medication Sig Dispense Refill  . fluticasone (FLONASE) 50 MCG/ACT nasal spray Place 2 sprays into both nostrils daily. 16 g 2  . ibuprofen (ADVIL,MOTRIN) 600 MG tablet Take 1 tablet (600 mg total) by mouth every 6 (six) hours as needed for mild pain. 30 tablet 5  . loratadine (CLARITIN) 10 MG tablet Take 1 tablet (10 mg total) by mouth daily. 30 tablet 2  . nitrofurantoin, macrocrystal-monohydrate, (MACROBID) 100 MG capsule Take 1 capsule (100 mg total) by mouth 2 (two) times daily. 14 capsule 0  . ondansetron (ZOFRAN-ODT) 8 MG disintegrating tablet  Take 1 tablet (8 mg total) by mouth every 8 (eight) hours as needed for nausea. 15 tablet 1  . ranitidine (ZANTAC) 150 MG tablet Take 1 tablet (150 mg total) by mouth 2 (two) times daily. 60 tablet 1  . sertraline (ZOLOFT) 100 MG tablet Take 1 tablet (100 mg total) by mouth daily. 30 tablet 2   No current facility-administered medications for this visit.    Review of Systems: General: Denies fever, chills, diaphoresis, appetite change and fatigue.  Respiratory: Denies SOB, DOE, cough, and wheezing.   Cardiovascular: Denies chest pain and palpitations.  Gastrointestinal: Denies nausea, vomiting, abdominal pain, and diarrhea.  Genitourinary: Positive for dysuria and flank/back pain. Denies increased frequency. Endocrine: Denies hot or cold intolerance, polyuria, and polydipsia. Musculoskeletal: Denies joint swelling, arthralgias and gait problem.  Skin: Denies pallor, rash and wounds.  Neurological: Denies dizziness, seizures, syncope, weakness, lightheadedness, numbness and headaches.  Psychiatric/Behavioral: Denies mood changes, and sleep disturbances.  Objective:   Physical Exam: Filed Vitals:   06/10/15 1146  BP: 114/69  Pulse: 71  Temp: 97.9 F (36.6 C)  TempSrc: Oral  Height: 5\' 5"  (1.651 m)  Weight: 189 lb 6.4 oz (85.911 kg)  SpO2: 100%    General: Spanish speaking female, alert, cooperative, NAD.  HEENT: PERRL, EOMI. Moist mucus membranes. Mild redness of they eyes. Pale turbinates.   Neck: Full range  of motion without pain, supple, no lymphadenopathy or carotid bruits Lungs: Clear to ascultation bilaterally, normal work of respiration, no wheezes, rales, rhonchi Heart: RRR, no murmurs, gallops, or rubs Abdomen: Soft, non-tender, non-distended, BS + Back: mild tenderness over lumbar spine. No CVA tenderness.  Extremities: No cyanosis, clubbing, or edema Neurologic: Alert & oriented X3, cranial nerves II-XII intact, strength grossly intact, sensation intact to light  touch   Assessment & Plan:   Please see problem based assessment and plan.

## 2015-06-11 DIAGNOSIS — R300 Dysuria: Secondary | ICD-10-CM | POA: Insufficient documentation

## 2015-06-11 NOTE — Progress Notes (Signed)
Medicine attending: Medical history, presenting problems, physical findings, and medications, reviewed with resident physician Dr Eden Jones on the day of the patient visit and I concur with his evaluation and management plan. 

## 2015-06-11 NOTE — Assessment & Plan Note (Signed)
Describes sneezing, itchy eyes, watering, runny nose. On exam, eyes are slightly red, nasal turbinates pale. No signs of infection. Scant clear discharge.  -Start Flonase bid + Claritin qd

## 2015-06-11 NOTE — Assessment & Plan Note (Signed)
Has been adequately treated for E. Coli UTI with Macrobid. Urinalysis performed today shows no signs of infection. Appears it has cleared appropriately since her last UA. Given her description, feel this is most likely related to dehydration. She states the pain is improved/resolves if she drinks water and her urine is clear. She recently had pelvic exam at urgent care, negative for significant infection other than E. Coli UTI as discussed above. No fever, chills, or nausea. No blood in the urine or colicky type pain to suggest stone. She does mention some minor pelvic pain, is scheduled for an appointment with OB/GYN in March.  -Continue adequate hydration -OTC NSAIDS, Tylenol for back pain -RTC in 4-6 weeks or sooner if symptoms do not resolve -OB/GYN on 06/26/15

## 2015-06-26 ENCOUNTER — Encounter: Payer: BLUE CROSS/BLUE SHIELD | Admitting: Gynecology

## 2015-07-10 ENCOUNTER — Encounter: Payer: Self-pay | Admitting: Internal Medicine

## 2015-07-10 ENCOUNTER — Ambulatory Visit (INDEPENDENT_AMBULATORY_CARE_PROVIDER_SITE_OTHER): Payer: BLUE CROSS/BLUE SHIELD | Admitting: Internal Medicine

## 2015-07-10 VITALS — BP 92/60 | HR 64 | Temp 97.9°F | Wt 192.1 lb

## 2015-07-10 DIAGNOSIS — M549 Dorsalgia, unspecified: Secondary | ICD-10-CM | POA: Insufficient documentation

## 2015-07-10 DIAGNOSIS — M546 Pain in thoracic spine: Secondary | ICD-10-CM | POA: Diagnosis not present

## 2015-07-10 MED ORDER — IBUPROFEN 800 MG PO TABS: 800.0000 mg | ORAL_TABLET | Freq: Three times a day (TID) | ORAL | Status: DC

## 2015-07-10 NOTE — Progress Notes (Signed)
   Subjective:    Patient ID: [facility], female    DOB: Apr 07, 1971, 45 y.o.   MRN: [id number]  HPI  45 yo female with ox of post partum PE s/p couamdin course completion, post partum depression, hx of sciatica pain, here with complain of back pain.   Was recently treated for dysuria 1 month ago. Was still having burning with urination after completing her macrobid course (Ecoli UTI sensitive to macrobid). Was referred to Ob/Gyn clinic and told to take NSAID/tylenol. This pain is better. No longer having dysuria or flank pain.  She started having upper back/shoulder blade pain on both sides 3 days ago without any radiation. She was doing some new exercise at the gym that involved lifting weight with her legs. When she was doing this she felt very sick and tired. Around the same time she noticed this pain on there shoulder blade area. She feels the pain more with movement of her arms and also when she takes a deep breathe. No SOB or chest pain. No dizziness or other symptoms.   Review of Systems  Constitutional: Negative for fever and chills.  HENT: Negative for congestion and sore throat.   Eyes: Negative for photophobia and visual disturbance.  Respiratory: Negative for cough, chest tightness and shortness of breath.   Cardiovascular: Negative for chest pain, palpitations and leg swelling.  Gastrointestinal: Negative for nausea, vomiting, abdominal pain, diarrhea and abdominal distention.  Endocrine: Negative.   Genitourinary: Negative for dysuria and flank pain.  Musculoskeletal: Positive for back pain.  Skin: Negative.  Negative for pallor and rash.  Neurological: Negative for dizziness, seizures, syncope, numbness and headaches.  Hematological: Negative for adenopathy. Does not bruise/bleed easily.  Psychiatric/Behavioral: Negative.        Objective:   Physical Exam  Constitutional: She is oriented to person, place, and time. She appears well-developed and  well-nourished. No distress.  Spoke to patient with Spanish interpretor.  HENT:  Head: Normocephalic and atraumatic.  Mouth/Throat: Oropharynx is clear and moist.  Eyes: Conjunctivae are normal. Right eye exhibits no discharge. Left eye exhibits no discharge.  Neck: Normal range of motion.  Cardiovascular: Normal rate and regular rhythm.  Exam reveals no gallop and no friction rub.   No murmur heard. Pulmonary/Chest: Effort normal and breath sounds normal. No respiratory distress. She has no wheezes.  Abdominal: Soft. Bowel sounds are normal. She exhibits no distension. There is no tenderness.  No CVA tenderness  Musculoskeletal:       Arms: Has tenderness to palpation over her bilateral scapular area (see picture).   This pain is reproducible with arm movement as well.  No spinous tenderness.   Neurological: She is alert and oriented to person, place, and time. No cranial nerve deficit. Coordination normal.  Skin: Skin is warm. She is not diaphoretic.  Psychiatric: She has a normal mood and affect.     Filed Vitals:   07/10/15 1432  BP: 92/60  Pulse: 64  Temp: 97.9 F (36.6 C)        Assessment & Plan:  See problemb ased a&p.

## 2015-07-10 NOTE — Assessment & Plan Note (Signed)
Acute upper back pain/shoulder blade pain likely 2/2 to recent weight lifting (on her legs). Even though she did not do any upper ext lifting, her back muscles can still be impacted from leg workouts. She does feel the pain with deep breathing as well but I still think that's 2/2 to MSK pain.  I will treat with scheduled ibuprofen 800mg  TID for 1 week.   With her hx of PE, if she gets SOB, Chest pain, or dizziness, I asked her to call us. At that point evaluation to rule out PE would be necessary. However, right now she is not hypoxic, not tachycardic, and not having any other symptoms to suggest PE.

## 2015-07-10 NOTE — Patient Instructions (Addendum)
Please take Ibuprofen 800mg  every 8 hours (three times daily).   If your pain is not improving then please let us know.  If you start having trouble breathing, chest pain, dizziness, or any other symptoms please call us.   Tome Ibuprofen 800mg  cada 8 horas (tres veces al da).  Si su dolor no mejora, por favor hganoslo saber.  Si empieza a tener problemas para respirar, dolor en el pecho, mareos o cualquier otro sntoma, por favor llmenos.

## 2015-07-13 NOTE — Progress Notes (Signed)
Internal Medicine Clinic Attending  Case discussed with Dr. Ahmed at the time of the visit.  We reviewed the resident's history and exam and pertinent patient test results.  I agree with the assessment, diagnosis, and plan of care documented in the resident's note. 

## 2015-07-23 ENCOUNTER — Other Ambulatory Visit: Payer: Self-pay | Admitting: Urgent Care

## 2015-08-13 ENCOUNTER — Ambulatory Visit (INDEPENDENT_AMBULATORY_CARE_PROVIDER_SITE_OTHER): Payer: BLUE CROSS/BLUE SHIELD | Admitting: Gynecology

## 2015-08-13 ENCOUNTER — Encounter: Payer: Self-pay | Admitting: Gynecology

## 2015-08-13 VITALS — BP 130/84 | Ht 64.0 in | Wt 194.0 lb

## 2015-08-13 DIAGNOSIS — M6289 Other specified disorders of muscle: Secondary | ICD-10-CM | POA: Diagnosis not present

## 2015-08-13 DIAGNOSIS — L292 Pruritus vulvae: Secondary | ICD-10-CM | POA: Diagnosis not present

## 2015-08-13 DIAGNOSIS — Z01419 Encounter for gynecological examination (general) (routine) without abnormal findings: Secondary | ICD-10-CM | POA: Diagnosis not present

## 2015-08-13 LAB — WET PREP FOR TRICH, YEAST, CLUE
Clue Cells Wet Prep HPF POC: NONE SEEN
Trich, Wet Prep: NONE SEEN
WBC, Wet Prep HPF POC: NONE SEEN
Yeast Wet Prep HPF POC: NONE SEEN

## 2015-08-13 MED ORDER — TERCONAZOLE 0.4 % VA CREA: 1.0000 | TOPICAL_CREAM | Freq: Every day | VAGINAL | Status: DC

## 2015-08-13 NOTE — Progress Notes (Signed)
[place] November 03, 1970 [id number]   History:    45 y.o.  for annual gyn exam with a complaint of vulvar pruritus after intercourse. Her husband uses a condom. She has no true discharge today just vulvar pruritus. Patient reports normal menstrual cycles.Patient had laser treatment of her cervix for dysplasia back in the year 2000 in [place]. Patient states that subsequent Pap smears have been normal. Patient also has complaining times of tiredness and fatigue.  Past medical history,surgical history, family history and social history were all reviewed and documented in the EPIC chart.  Gynecologic History Patient's last menstrual period was 07/23/2015. Contraception: condoms Last Pap: 2015. Results were: normal Last mammogram: 2015. Results were: normal  Obstetric History OB History  Gravida Para Term Preterm AB SAB TAB Ectopic Multiple Living  5 5 5       0 4    # Outcome Date GA Lbr Len/2nd Weight Sex Delivery Anes PTL Lv  5 Term 11/04/14 [redacted]w[redacted]d  7 lb 8.5 oz (3.416 kg) F Vag-Spont EPI  Y  4 Term 03/18/08 [redacted]w[redacted]d    Vag-Spont  N Y  3 Term 11/07/00 [redacted]w[redacted]d    Vag-Spont  N Y  2 Term 07/09/90 [redacted]w[redacted]d    Vag-Spont  N Y  1 Term 08/24/89 [redacted]w[redacted]d    Vag-Spont          ROS: A ROS was performed and pertinent positives and negatives are included in the history.  GENERAL: No fevers or chills. HEENT: No change in vision, no earache, sore throat or sinus congestion. NECK: No pain or stiffness. CARDIOVASCULAR: No chest pain or pressure. No palpitations. PULMONARY: No shortness of breath, cough or wheeze. GASTROINTESTINAL: No abdominal pain, nausea, vomiting or diarrhea, melena or bright red blood per rectum. GENITOURINARY: No urinary frequency, urgency, hesitancy or dysuria. MUSCULOSKELETAL: No joint or muscle pain, no back pain, no recent trauma. DERMATOLOGIC: No rash, no itching, no lesions. ENDOCRINE: No polyuria, polydipsia, no heat or cold intolerance. No recent change in weight.  HEMATOLOGICAL: No anemia or easy bruising or bleeding. NEUROLOGIC: No headache, seizures, numbness, tingling or weakness. PSYCHIATRIC: No depression, no loss of interest in normal activity or change in sleep pattern.     Exam: chaperone present  BP 130/84 mmHg  Ht 5\' 4"  (1.626 m)  Wt 194 lb (87.998 kg)  BMI 33.28 kg/m2  LMP 07/23/2015  Body mass index is 33.28 kg/(m^2).  General appearance : Well developed well nourished female. No acute distress HEENT: Eyes: no retinal hemorrhage or exudates,  Neck supple, trachea midline, no carotid bruits, no thyroidmegaly Lungs: Clear to auscultation, no rhonchi or wheezes, or rib retractions  Heart: Regular rate and rhythm, no murmurs or gallops Breast:Examined in sitting and supine position were symmetrical in appearance, no palpable masses or tenderness,  no skin retraction, no nipple inversion, no nipple discharge, no skin discoloration, no axillary or supraclavicular lymphadenopathy Abdomen: no palpable masses or tenderness, no rebound or guarding Extremities: no edema or skin discoloration or tenderness  Pelvic:  Bartholin, Urethra, Skene Glands: Within normal limits             Vagina: No gross lesions or discharge  Cervix: No gross lesions or discharge  Uterus  anteverted, normal size, shape and consistency, non-tender and mobile  Adnexa  Without masses or tenderness  Anus and perineum  normal   Rectovaginal  normal sphincter tone without palpated masses or tenderness  Hemoccult not indicated   Wet prep negative  Assessment/Plan:  45 y.o. female for annual exam will return to the office tomorrow and a fasting state for the following screening blood work: Fasting lipid profile, compresses a metabolic panel, TSH, CBC, and urinalysis. Pap smear with HPV screening was done today. Patient was provided with requisition to schedule her overdue mammogram. Literature information on the nonhormonal ParaGard T380A IUD was provided.  Because of patient's muscle tiredness and fatigue were going to check a vitamin D level today to rule out vitamin D deficiency. A prescription for Terazol 7 was provided for her to apply to external genitalia as a result of vulvar pruritus since we did the wet prep was only inside the vagina in the event she has yeast vulvitisFERNANDEZ,Farah Lepak H MD, 5:13 PM 08/13/2015

## 2015-08-13 NOTE — Patient Instructions (Signed)
Informacin sobre el dispositivo intrauterino (Intrauterine Device Information) Un dispositivo intrauterino (DIU) se inserta en el tero e impide el embarazo. Hay dos tipos de DIU:   DIU de cobre: este tipo de DIU est recubierto con un alambre de cobre y se inserta dentro del tero. El cobre hace que el tero y las trompas de Falopio produzcan un liquido que destruye los espermatozoides. El DIU de cobre puede permanecer en el lugar durante 10 aos.  DIU con hormona: este tipo de DIU contiene la hormona progestina (progesterona sinttica). Las hormonas hacen que el moco cervical se haga ms espeso, lo que evita que el esperma ingrese al tero. Tambin hace que la membrana que recubre internamente al tero sea ms delgada lo que impide el implante del vulo fertilizado. La hormona debilita o destruye los espermatozoides que ingresan al tero. Alguno de los tipos de DIU hormonal pueden permanecer en el lugar durante 5 aos y otros tipos pueden dejarse en el lugar por 3 aos. El mdico se asegurar de que usted sea una buena candidata para usar el DIU. Converse con su mdico acerca de los posibles efectos secundarios.  VENTAJAS DEL DISPOSITIVO INTRAUTERINO  El DIU es muy eficaz, reversible, de accin prolongada y de bajo mantenimiento.  No hay efectos secundarios relacionados con el estrgeno.  El DIU puede ser utilizado durante la lactancia.  No est asociado con el aumento de peso.  Funciona inmediatamente despus de la insercin.  El DIU hormonal funciona inmediatamente si se inserta dentro de los 7 das del inicio del perodo. Ser necesario que utilice un mtodo anticonceptivo adicional durante 7 das si el DIU hormonal se inserta en algn otro momento del ciclo.  El DIU de cobre no interfiere con las hormonas femeninas.  El DIU hormonal puede hacer que los perodos menstruales abundantes se hagan ms ligeros y que haya menos clicos.  El DIU hormonal puede usarse durante 3 a 5  aos.  El DIU de cobre puede usarse durante 10 aos. DESVENTAJAS DEL DISPOSITIVO INTRAUTERINO  El DIU hormonal puede estar asociado con patrones de sangrado irregular.  El DIU de cobre puede hacer que el flujo menstrual ms abundante y doloroso.  Puede experimentar clicos y sangrado vaginal despus de la insercin.   Esta informacin no tiene como fin reemplazar el consejo del mdico. Asegrese de hacerle al mdico cualquier pregunta que tenga.   Document Released: 09/22/2009 Document Revised: 12/05/2012 Elsevier Interactive Patient Education 2016 Elsevier Inc.  

## 2015-08-14 ENCOUNTER — Other Ambulatory Visit: Payer: BLUE CROSS/BLUE SHIELD

## 2015-08-14 DIAGNOSIS — M6289 Other specified disorders of muscle: Secondary | ICD-10-CM | POA: Diagnosis not present

## 2015-08-14 DIAGNOSIS — Z01419 Encounter for gynecological examination (general) (routine) without abnormal findings: Secondary | ICD-10-CM | POA: Diagnosis not present

## 2015-08-14 LAB — COMPREHENSIVE METABOLIC PANEL
ALT: 8 U/L (ref 6–29)
AST: 13 U/L (ref 10–30)
Albumin: 3.7 g/dL (ref 3.6–5.1)
Alkaline Phosphatase: 71 U/L (ref 33–115)
BUN: 10 mg/dL (ref 7–25)
CO2: 25 mmol/L (ref 20–31)
Calcium: 8.7 mg/dL (ref 8.6–10.2)
Chloride: 102 mmol/L (ref 98–110)
Creat: 0.44 mg/dL — ABNORMAL LOW (ref 0.50–1.10)
Glucose, Bld: 84 mg/dL (ref 65–99)
Potassium: 4.4 mmol/L (ref 3.5–5.3)
Sodium: 137 mmol/L (ref 135–146)
Total Bilirubin: 0.8 mg/dL (ref 0.2–1.2)
Total Protein: 6.7 g/dL (ref 6.1–8.1)

## 2015-08-14 LAB — URINALYSIS W MICROSCOPIC + REFLEX CULTURE
Bacteria, UA: NONE SEEN [HPF]
Bilirubin Urine: NEGATIVE
Casts: NONE SEEN [LPF]
Crystals: NONE SEEN [HPF]
Glucose, UA: NEGATIVE
Hgb urine dipstick: NEGATIVE
Ketones, ur: NEGATIVE
Leukocytes, UA: NEGATIVE
Nitrite: NEGATIVE
Protein, ur: NEGATIVE
Specific Gravity, Urine: 1.022 (ref 1.001–1.035)
Yeast: NONE SEEN [HPF]
pH: 5.5 (ref 5.0–8.0)

## 2015-08-14 LAB — CBC WITH DIFFERENTIAL/PLATELET
Basophils Absolute: 0 cells/uL (ref 0–200)
Basophils Relative: 0 %
Eosinophils Absolute: 134 cells/uL (ref 15–500)
Eosinophils Relative: 2 %
HCT: 40.9 % (ref 35.0–45.0)
Hemoglobin: 13.5 g/dL (ref 11.7–15.5)
Lymphocytes Relative: 37 %
Lymphs Abs: 2479 cells/uL (ref 850–3900)
MCH: 30.6 pg (ref 27.0–33.0)
MCHC: 33 g/dL (ref 32.0–36.0)
MCV: 92.7 fL (ref 80.0–100.0)
MPV: 9.8 fL (ref 7.5–12.5)
Monocytes Absolute: 469 cells/uL (ref 200–950)
Monocytes Relative: 7 %
Neutro Abs: 3618 cells/uL (ref 1500–7800)
Neutrophils Relative %: 54 %
Platelets: 265 10*3/uL (ref 140–400)
RBC: 4.41 MIL/uL (ref 3.80–5.10)
RDW: 13 % (ref 11.0–15.0)
WBC: 6.7 10*3/uL (ref 3.8–10.8)

## 2015-08-14 LAB — LIPID PANEL
Cholesterol: 152 mg/dL (ref 125–200)
HDL: 45 mg/dL — ABNORMAL LOW (ref >=46)
LDL Cholesterol: 90 mg/dL (ref <130)
Total CHOL/HDL Ratio: 3.4 Ratio (ref <=5.0)
Triglycerides: 84 mg/dL (ref <150)
VLDL: 17 mg/dL (ref <30)

## 2015-08-14 LAB — TSH: TSH: 2.28 mIU/L

## 2015-08-15 LAB — URINE CULTURE: Colony Count: 50000

## 2015-08-15 LAB — VITAMIN D 25 HYDROXY (VIT D DEFICIENCY, FRACTURES): Vit D, 25-Hydroxy: 20 ng/mL — ABNORMAL LOW (ref 30–100)

## 2015-08-17 ENCOUNTER — Encounter: Payer: Self-pay | Admitting: Gynecology

## 2015-08-17 LAB — PAP, TP IMAGING W/ HPV RNA, RFLX HPV TYPE 16,18/45: HPV mRNA, High Risk: NOT DETECTED

## 2015-08-18 ENCOUNTER — Telehealth: Payer: Self-pay | Admitting: Gynecology

## 2015-08-18 NOTE — Telephone Encounter (Signed)
08/18/15-Per Drew at [facility] they will cover the [facility] IUD for contraception at 100%, no copay. Claudia to call pt with this information as she speaks Spanish primarily. CV:[id number]

## 2015-08-24 ENCOUNTER — Other Ambulatory Visit: Payer: Self-pay | Admitting: Anesthesiology

## 2015-08-24 DIAGNOSIS — E559 Vitamin D deficiency, unspecified: Secondary | ICD-10-CM

## 2015-08-24 MED ORDER — NITROFURANTOIN MONOHYD MACRO 100 MG PO CAPS: 100.0000 mg | ORAL_CAPSULE | Freq: Two times a day (BID) | ORAL | Status: DC

## 2015-08-24 MED ORDER — VITAMIN D (ERGOCALCIFEROL) 1.25 MG (50000 UNIT) PO CAPS: 50000.0000 [IU] | ORAL_CAPSULE | ORAL | Status: DC

## 2015-09-24 ENCOUNTER — Other Ambulatory Visit: Payer: Self-pay | Admitting: Gynecology

## 2015-09-25 ENCOUNTER — Ambulatory Visit (INDEPENDENT_AMBULATORY_CARE_PROVIDER_SITE_OTHER): Payer: BLUE CROSS/BLUE SHIELD | Admitting: Gynecology

## 2015-09-25 ENCOUNTER — Encounter: Payer: Self-pay | Admitting: Gynecology

## 2015-09-25 VITALS — BP 132/88

## 2015-09-25 DIAGNOSIS — N644 Mastodynia: Secondary | ICD-10-CM | POA: Diagnosis not present

## 2015-09-25 DIAGNOSIS — Z3009 Encounter for other general counseling and advice on contraception: Secondary | ICD-10-CM

## 2015-09-25 NOTE — Progress Notes (Signed)
HPI: Patient is a 45 year old was seen in the office for annual exam in April of this year. Patient had a normal spontaneous vaginal delivery in July of last year. She is currently breast-feeding and is planning on tapering off by the end of this month. She's here today because of breast tenderness on the left upper outer quadrant of the past couple weeks especially after breast-feeding she denies any recent trauma or injury. She denies any nipple discharge. Review of her record indicated that her last mammogram was in 2015 was normal. She denies any family history of any breast malignancy. She is currently using condoms for contraception and is leaning on returning to the office during her menses to place a ParaGard T380A IUD   ROS: A ROS was performed and pertinent positives and negatives are included in the history.  GENERAL: No fevers or chills. HEENT: No change in vision, no earache, sore throat or sinus congestion. NECK: No pain or stiffness. CARDIOVASCULAR: No chest pain or pressure. No palpitations. PULMONARY: No shortness of breath, cough or wheeze. GASTROINTESTINAL: No abdominal pain, nausea, vomiting or diarrhea, melena or bright red blood per rectum. GENITOURINARY: No urinary frequency, urgency, hesitancy or dysuria. MUSCULOSKELETAL: No joint or muscle pain, no back pain, no recent trauma. DERMATOLOGIC: No rash, no itching, no lesions. ENDOCRINE: No polyuria, polydipsia, no heat or cold intolerance. No recent change in weight. HEMATOLOGICAL: No anemia or easy bruising or bleeding. NEUROLOGIC: No headache, seizures, numbness, tingling or weakness. PSYCHIATRIC: No depression, no loss of interest in normal activity or change in sleep pattern.   PE: Blood pressure 132/88 Gen. appearance a developed well nourished female with the above-mentioned complaining Both breasts were examined sitting supine position both present symmetrical. No skin discoloration or nipple inversion no supra clavicular  axillary lymphadenopathy no palpable masses or tenderness on the right breast. No palpable masses on the left breast slightly tender near the tail of Spence.   Assessment Plan: Patient with tenderness near the suspensory ligament of the breast near the tell Spence but no discernible mass. Patient expresses discomfort especially after breast-feeding at times. She is going to be scheduled for diagnostic mammogram the first week of July with contralateral breast screening mammogram by then she'll be through breast-feeding. She will also return to the office for placement of ParaGard T380A IUD. The risks benefits and pros and cons were discussed and literature information was provided in [place].    Greater than 50% of time was spent in counseling and coordinating care of this patient.   Time of consultation: 15   Minutes.

## 2015-09-25 NOTE — Patient Instructions (Signed)
Sensibilidad en las mamas (Breast Tenderness) La sensibilidad en las mamas es un problema frecuente en las mujeres de todas las edades. y puede causar molestias leves o dolor intenso. Sus causas son variadas. Su mdico determinar la causa probable de la sensibilidad mediante el examen de las mamas, las preguntas sobre los sntomas y la indicacin de algunos estudios. Por lo general, la sensibilidad en las mamas no significa que tenga cncer de mama. INSTRUCCIONES PARA EL CUIDADO EN EL HOGAR  A menudo, la sensibilidad en las mamas puede tratarse en el hogar. Puede intentar lo siguiente:  Probarse un nuevo sostn que le brinde ms sujecin, especialmente mientras hace actividad fsica.  Usar un sostn con mejor sujecin o uno deportivo mientras duerme cuando las mamas estn muy sensibles.  Si tiene una lesin mamaria, aplique hielo en la zona:  Ponga el hielo en una bolsa plstica.  Colquese una toalla entre la piel y la bolsa de hielo.  Deje el hielo durante 20 minutos y aplquelo 2 a 3 veces por da.  Si tiene las mamas repletas de leche debido a la lactancia, intente lo siguiente:  Extrigase leche manualmente o con un sacaleche.  Aplquese una compresa tibia en las mamas para ayudar a la descarga.  Tome analgsicos de venta libre si su mdico lo autoriza.  Tome otros medicamentos que su mdico le recete, entre ellos, antibiticos o anticonceptivos. A largo plazo, puede aliviar la sensibilidad en las mamas si hace lo siguiente:  Disminuye el consumo de cafena.  Disminuye la cantidad de grasa de la dieta. Lleva un registro de los das y las horas cuando tiene mayor sensibilidad en las mamas. Esto ser de ayuda para que usted y su mdico encuentren la causa de la sensibilidad y cmo aliviarla. Adems, aprenda cmo examinarse las mamas en casa. Esto la ayudar a palpar un crecimiento o un bulto fuera de lo normal que podra causar la sensibilidad. SOLICITE ATENCIN MDICA SI:    Cualquier zona de la mama est dura, enrojecida y caliente al tacto. Puede ser un signo de infeccin.  Hay secrecin de los pezones (y no est amamantando). En especial, vigile la secrecin de sangre o pus.  Tiene fiebre, adems de sensibilidad en las mamas.  Tiene un bulto nuevo o doloroso en la mama que no desaparece despus de la finalizacin del perodo menstrual.  Ha intentando controlar el dolor en casa, pero no desaparece.  El dolor de la mama es ms intenso o le dificulta hacer las cosas que hace habitualmente durante el da.   Esta informacin no tiene como fin reemplazar el consejo del mdico. Asegrese de hacerle al mdico cualquier pregunta que tenga.   Document Released: 01/23/2013 Elsevier Interactive Patient Education 2016 Elsevier Inc.  

## 2015-09-27 IMAGING — US US OB LIMITED
1 series · 13 of 15 positions shown · non-contrast
Comparison: none

[Series 1: us ob limited · 0.23mm/px · 15 acquisitions, 13 frames shown]
[im 1/15]
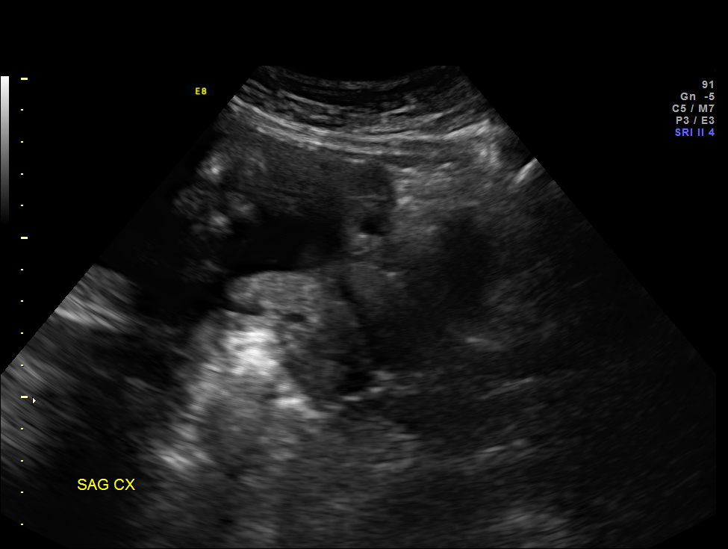
[im 2/15]
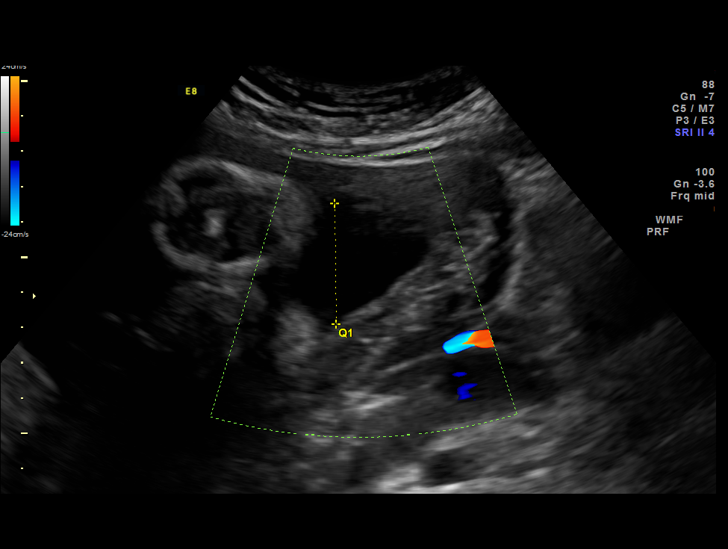
[im 3/15]
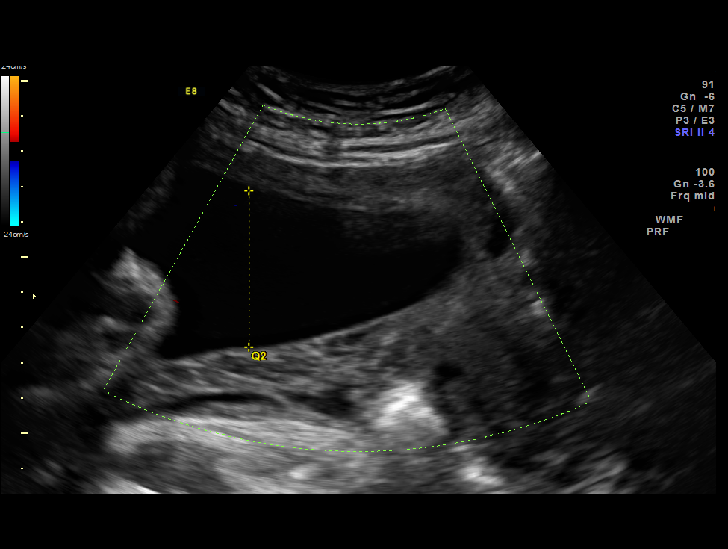
[im 5/15]
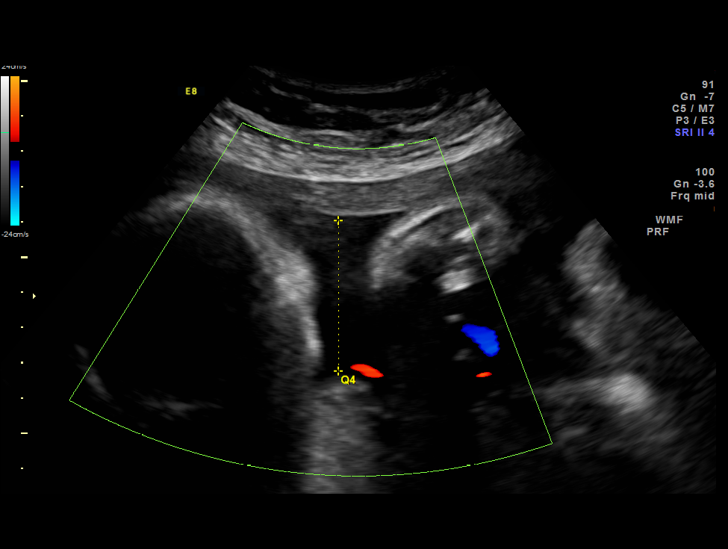
[im 6/15]
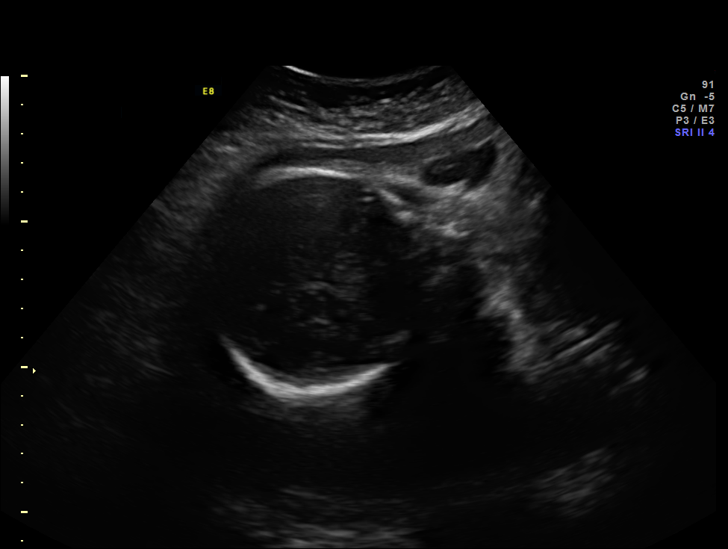
[im 7/15]
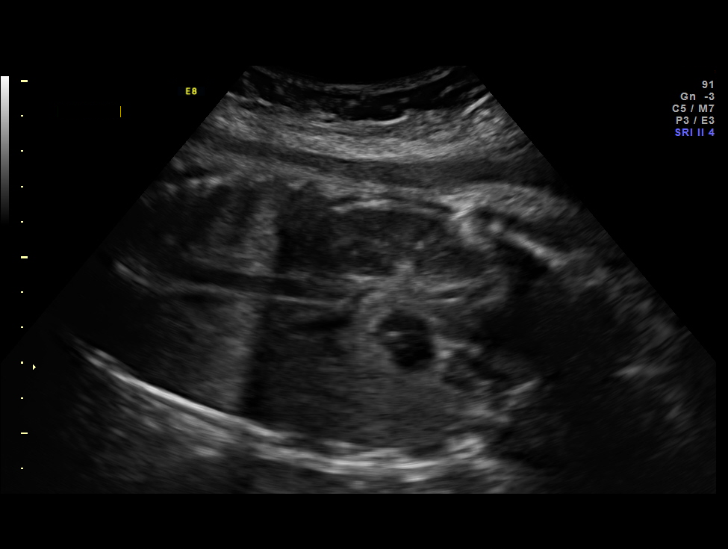
[im 8/15]
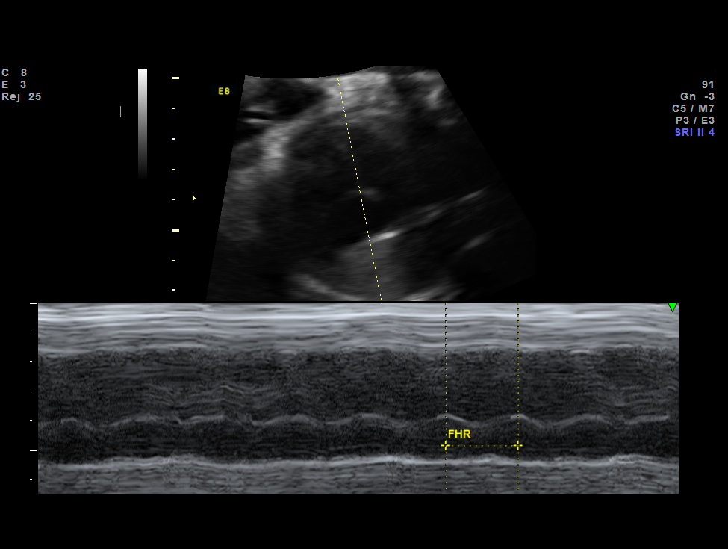
[im 9/15]
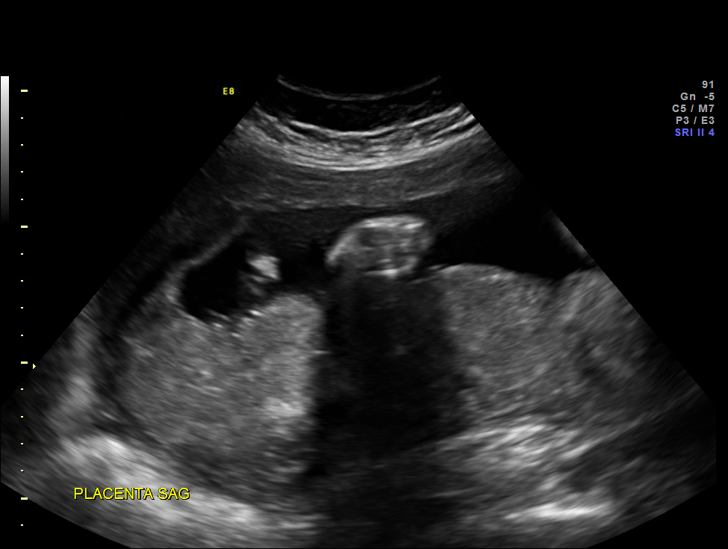
[im 10/15]
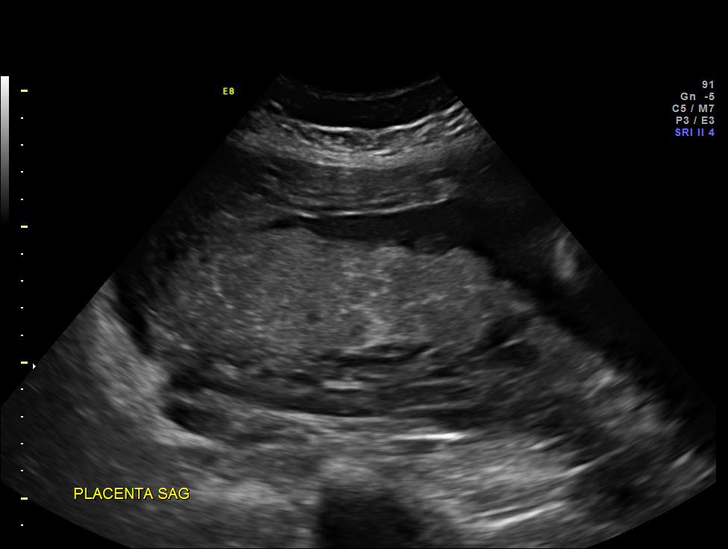
[im 11/15]
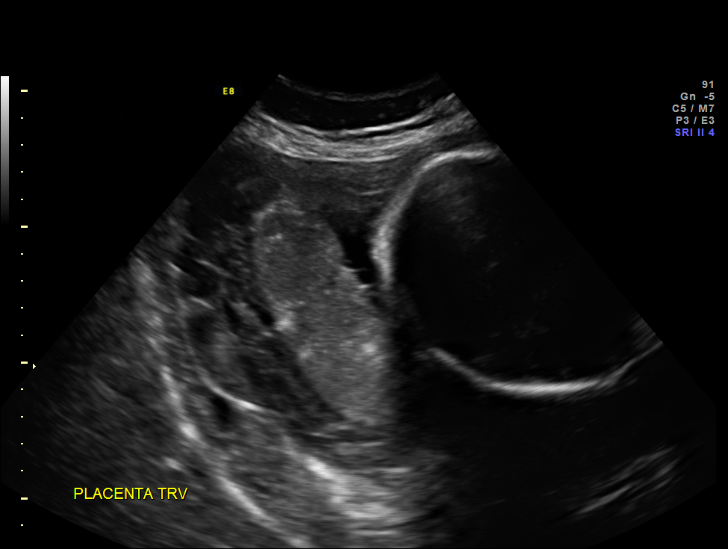
[im 13/15]
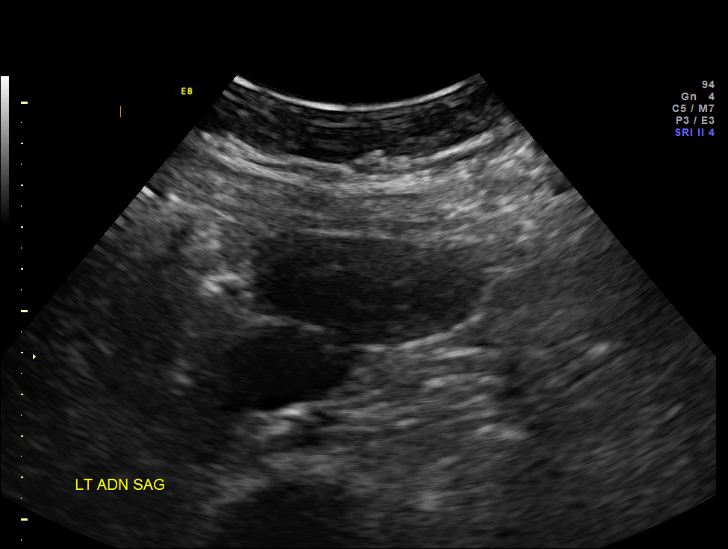
[im 14/15]
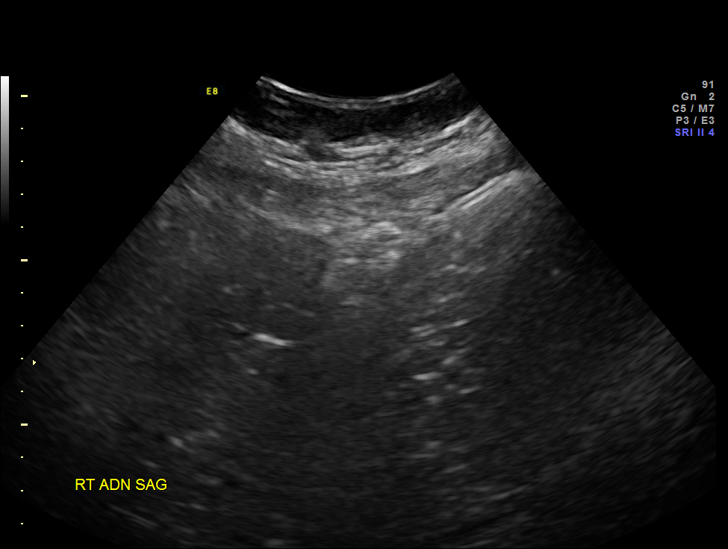
[im 15/15]
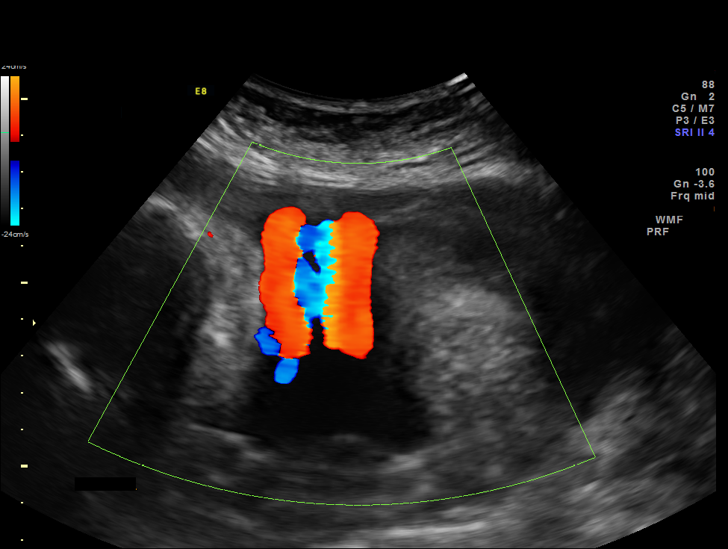

[13 of 15 positions shown; findings below may reference images not displayed]

OBSTETRICS REPORT

OCA

Service(s) Provided

[HOSPITAL]                                         76815.0
Indications

34 weeks gestation of pregnancy
Diabetes - Pregestational, 3rd trimester (on
glyburide)
Advanced maternal age multigravida (43), third
trimester - declined aneuploidy testing
Fetal Evaluation

Num Of Fetuses:    1
Fetal Heart Rate:  139                          bpm
Cardiac Activity:  Observed
Presentation:      Breech
Placenta:          Posterior, above cervical
os
P. Cord Insertion: Previously Visualized

Amniotic Fluid
AFI FV:      Subjectively within normal limits
AFI Sum:     16.47   cm       60  %Tile      Larg Pckt:   4.44  cm
RUQ:   3.43    cm   RLQ:    4.27   cm    LUQ:   4.44    cm   LLQ:    4.33   cm
Gestational Age

LMP:           34w 0d        Date:  02/04/14                 EDD:   11/11/14
Best:          34w 0d     Det. By:  LMP  (02/04/14)          EDD:   11/11/14
Cervix Uterus Adnexa

Cervix:       Not visualized (advanced GA >14wks)

Adnexa:     No abnormality visualized.
Impression

SIUP at 34+0 weeks
Breech presentation
Normal amniotic fluid volume
NST reactive
Recommendations

Continue twice weekly NSTs with weekly AFIs
Growth in 2 weeks

## 2015-09-29 ENCOUNTER — Telehealth: Payer: Self-pay | Admitting: *Deleted

## 2015-09-29 DIAGNOSIS — N644 Mastodynia: Secondary | ICD-10-CM

## 2015-09-29 NOTE — Telephone Encounter (Signed)
Orders placed breast center will contact her to schedule.

## 2015-09-29 NOTE — Telephone Encounter (Signed)
-----   Message from Terrance Mass, MD sent at 09/25/2015  2:58 PM EDT ----- Please schedule diagnostic mammogram for this patient. She's currently breast-feeding but we'll stop at the end of this month. Please schedule a diagnostic mammogram of the left breast with screening mammogram of the right first week of July. Tenderness in the upper left upper quadrant of the left breast no masses.

## 2015-10-16 ENCOUNTER — Emergency Department (HOSPITAL_COMMUNITY): Payer: BLUE CROSS/BLUE SHIELD

## 2015-10-16 ENCOUNTER — Ambulatory Visit (INDEPENDENT_AMBULATORY_CARE_PROVIDER_SITE_OTHER): Payer: BLUE CROSS/BLUE SHIELD | Admitting: Family Medicine

## 2015-10-16 ENCOUNTER — Encounter: Payer: Self-pay | Admitting: Family Medicine

## 2015-10-16 ENCOUNTER — Encounter (HOSPITAL_COMMUNITY): Payer: Self-pay | Admitting: *Deleted

## 2015-10-16 ENCOUNTER — Ambulatory Visit (INDEPENDENT_AMBULATORY_CARE_PROVIDER_SITE_OTHER): Payer: BLUE CROSS/BLUE SHIELD

## 2015-10-16 ENCOUNTER — Emergency Department (HOSPITAL_COMMUNITY)
Admission: EM | Admit: 2015-10-16 | Discharge: 2015-10-16 | Disposition: A | Discharge status: Home / Self Care | Payer: BLUE CROSS/BLUE SHIELD | Attending: Dermatology | Admitting: Dermatology

## 2015-10-16 VITALS — BP 120/78 | HR 59 | Temp 98.3°F | Resp 17 | Ht 64.0 in | Wt 192.0 lb

## 2015-10-16 DIAGNOSIS — Z5321 Procedure and treatment not carried out due to patient leaving prior to being seen by health care provider: Secondary | ICD-10-CM | POA: Insufficient documentation

## 2015-10-16 DIAGNOSIS — R0602 Shortness of breath: Secondary | ICD-10-CM | POA: Diagnosis not present

## 2015-10-16 DIAGNOSIS — J45909 Unspecified asthma, uncomplicated: Secondary | ICD-10-CM | POA: Insufficient documentation

## 2015-10-16 DIAGNOSIS — Z86711 Personal history of pulmonary embolism: Secondary | ICD-10-CM | POA: Diagnosis not present

## 2015-10-16 DIAGNOSIS — Z859 Personal history of malignant neoplasm, unspecified: Secondary | ICD-10-CM | POA: Insufficient documentation

## 2015-10-16 DIAGNOSIS — R12 Heartburn: Secondary | ICD-10-CM | POA: Diagnosis not present

## 2015-10-16 DIAGNOSIS — Z8719 Personal history of other diseases of the digestive system: Secondary | ICD-10-CM

## 2015-10-16 DIAGNOSIS — R079 Chest pain, unspecified: Secondary | ICD-10-CM

## 2015-10-16 LAB — BASIC METABOLIC PANEL
Anion gap: 6 (ref 5–15)
BUN: 12 mg/dL (ref 6–20)
CO2: 26 mmol/L (ref 22–32)
Calcium: 8.7 mg/dL — ABNORMAL LOW (ref 8.9–10.3)
Chloride: 104 mmol/L (ref 101–111)
Creatinine, Ser: 0.51 mg/dL (ref 0.44–1.00)
GFR calc Af Amer: >60 mL/min (ref >60)
GFR calc non Af Amer: >60 mL/min (ref >60)
Glucose, Bld: 98 mg/dL (ref 65–99)
Potassium: 3.7 mmol/L (ref 3.5–5.1)
Sodium: 136 mmol/L (ref 135–145)

## 2015-10-16 LAB — I-STAT TROPONIN, ED: Troponin i, poc: 0 ng/mL (ref 0.00–0.08)

## 2015-10-16 LAB — CBC
HCT: 41.5 % (ref 36.0–46.0)
Hemoglobin: 13.9 g/dL (ref 12.0–15.0)
MCH: 31 pg (ref 26.0–34.0)
MCHC: 33.5 g/dL (ref 30.0–36.0)
MCV: 92.6 fL (ref 78.0–100.0)
Platelets: 233 10*3/uL (ref 150–400)
RBC: 4.48 MIL/uL (ref 3.87–5.11)
RDW: 12.6 % (ref 11.5–15.5)
WBC: 9.2 10*3/uL (ref 4.0–10.5)

## 2015-10-16 LAB — D-DIMER, QUANTITATIVE: D-Dimer, Quant: 0.47 mcg/mL FEU (ref <0.50)

## 2015-10-16 MED ORDER — OMEPRAZOLE 20 MG PO CPDR: 20.0000 mg | DELAYED_RELEASE_CAPSULE | Freq: Every day | ORAL | Status: DC

## 2015-10-16 MED ORDER — GI COCKTAIL ~~LOC~~
30.0000 mL | Freq: Once | ORAL | Status: AC
  Administered 2015-10-16: 30 mL via ORAL

## 2015-10-16 NOTE — Progress Notes (Addendum)
Subjective:  By signing my name below, I, Kim [name], attest that this documentation has been prepared under the direction and in the presence of [name], MD.  Electronically Signed: Thea Alken, ED Scribe. 10/16/2015. 9:59 AM.   Patient ID: [facility] Kim [name], female    DOB: [birth year], 45 y.o.   MRN: [id number]  HPI Chief Complaint  Patient presents with  . Chest Pain    HPI Comments: [facility] Kim [name] is a 45 y.o. female who presents to the [facility] complaining of chest pain. Hx gestational DM and pulmonary embolus in 2016 thought to be due to pregnancy treated with coumadin 01/2015. Pt states chest pain woke her up at midnight. Pt had tooth extraction 1 day ago, 10 minutes after taking medication last night, prescribed dentist, she developed chest pain. She describes pain as intermittent sore, burning pain and rates current pain 7/10. She has pain with deep breaths. She denies radiating pain. Pt takes zantac daily for GERD, but believes current CP is different from usual heartburn symptoms.  Hx of cholecystectomy 3-4 months ago. No hx of MI.  She denies SOB, nausea, sweating, calf pain or swelling.  Pt states during the time of pulmonary embolus she initially had calf pain had pain more so in her back. Pt checked into the ED last night but left due to 3 hour wait time she left without being seen.   Patient Active Problem List   Diagnosis Date Noted  . Mastodynia, female 09/25/2015  . Acute upper back pain 07/10/2015  . Dysuria 06/11/2015  . Allergic rhinitis 06/10/2015  . Flank pain 05/25/2015  . Need for immunization against influenza 01/28/2015  . Post partum depression 01/05/2015  . Dizziness 12/29/2014  . Pain, pelvic, female 11/24/2014  . Sciatic pain 11/24/2014  . Hx of pulmonary embolus during pregnancy 11/09/2014  . Hx of gestational diabetes mellitus, not currently pregnant    Past Medical History  Diagnosis Date  . Abnormal Pap  smear     patient states she had cancer that was removed from her cervix  . Asthma     when pregnant/ once 6 yrs ago only time asthma attack the dr said  . Gestational diabetes   . Cancer ([place])   . Renal insufficiency   . Pulmonary embolism ([place])   . Clotting disorder ([place])   . Vitamin D deficiency    Past Surgical History  Procedure Laterality Date  . No past surgeries    . Cholecystectomy     No Known Allergies Prior to Admission medications   Medication Sig Start Date End Date Taking? Authorizing Provider  HYDROcodone-acetaminophen [facility]) 10-325 MG tablet  10/15/15  Yes Historical Provider, MD  ranitidine (ZANTAC) 150 MG capsule Take 150 mg by mouth 2 (two) times daily.   Yes Historical Provider, MD  Vitamin D, Ergocalciferol, (DRISDOL) 50000 units CAPS capsule Take 1 capsule (50,000 Units total) by mouth every 7 (seven) days. 08/24/15  Yes Terrance Mass, MD   Social History   Social History  . Marital Status: Married    Spouse Name: [name]  . Number of Children: [name]  . Years of Education: [name]   Occupational History  . Not on file.   Social History Main Topics  . Smoking status: Never Smoker   . Smokeless tobacco: Never Used  . Alcohol Use: 0.0 oz/week    0 Standard drinks or equivalent per week     Comment: Occasionally.  Marland Kitchen  Drug Use: No  . Sexual Activity:    Partners: Male    Birth Control/ Protection: Condom   Other Topics Concern  . Not on file   Social History Narrative   Married with four children. Came to [place] from [place] in 2001. Unemployed currently. Spanish speaking.   Review of Systems  Constitutional: Negative for fever, chills and diaphoresis.  Respiratory: Negative for apnea and shortness of breath.   Cardiovascular: Positive for chest pain. Negative for leg swelling.  Gastrointestinal: Negative for nausea and vomiting.    Objective:   Physical Exam  Constitutional: She is oriented to person, place, and time. She appears well-developed  and well-nourished. No distress.  HENT:  Head: Normocephalic and atraumatic.  Eyes: Conjunctivae and EOM are normal.  Neck: Neck supple.  Cardiovascular: Normal rate, regular rhythm and normal heart sounds.  Exam reveals no gallop and no friction rub.   No murmur heard. Pulmonary/Chest: Effort normal and breath sounds normal. No respiratory distress. She has no wheezes. She has no rales.  Unable to reproduce pain with palpation.   Musculoskeletal: Normal range of motion.  Neurological: She is alert and oriented to person, place, and time.  Skin: Skin is warm and dry.  Psychiatric: She has a normal mood and affect. Her behavior is normal.  Nursing note and vitals reviewed.  Filed Vitals:   10/16/15 0926  BP: 120/78  Pulse: 59  Temp: 98.3 F (36.8 C)  TempSrc: Oral  Resp: 17  Height: 5\' 4"  (1.626 m)  Weight: 192 lb (87.091 kg)  SpO2: 100%   Results for orders placed or performed during the hospital encounter of [id number]  Basic metabolic panel  Result Value Ref Range   Sodium 136 135 - 145 mmol/L   Potassium 3.7 3.5 - 5.1 mmol/L   Chloride 104 101 - 111 mmol/L   CO2 26 22 - 32 mmol/L   Glucose, Bld 98 65 - 99 mg/dL   BUN 12 6 - 20 mg/dL   Creatinine, Ser 0.51 0.44 - 1.00 mg/dL   Calcium 8.7 (L) 8.9 - 10.3 mg/dL   GFR calc non Af Amer >60 >60 mL/min   GFR calc Af Amer >60 >60 mL/min   Anion gap 6 5 - 15  CBC  Result Value Ref Range   WBC 9.2 4.0 - 10.5 K/uL   RBC 4.48 3.87 - 5.11 MIL/uL   Hemoglobin 13.9 12.0 - 15.0 g/dL   HCT 41.5 36.0 - 46.0 %   MCV 92.6 78.0 - 100.0 fL   MCH 31.0 26.0 - 34.0 pg   MCHC 33.5 30.0 - 36.0 g/dL   RDW 12.6 11.5 - 15.5 %   Platelets 233 150 - 400 K/uL  I-stat troponin, ED  Result Value Ref Range   Troponin i, poc 0.00 0.00 - 0.08 ng/mL   Comment 3           Dg Chest 2 View Emergency Department  10/16/2015  CLINICAL DATA:  Awakened with chest pain at midnight last night; history of pulmonary embolus last year. EXAM: CHEST  2 VIEW  COMPARISON:  Chest x-ray of November 09, 2014 FINDINGS: The lungs are well-expanded and clear. The heart and pulmonary vascularity are normal. The mediastinum is normal in width. There is no pleural effusion or pneumothorax. There is stable gentle S shaped thoracolumbar scoliosis. There is mild degenerative disc disease of the thoracic spine. IMPRESSION: No active cardiopulmonary disease. Electronically Signed   By: David  Martinique M.D.   On:  10/16/2015 10:33   Dg Chest 2 View  [facility]  10/16/2015  CLINICAL DATA:  Chest pain and dyspnea, onset tonight EXAM: CHEST  2 VIEW COMPARISON:  01/04/2015 FINDINGS: The heart size and mediastinal contours are within normal limits. Both lungs are clear. The visualized skeletal structures are unremarkable. IMPRESSION: No active cardiopulmonary disease. Electronically Signed   By: Andreas Newport M.D.   On: 10/16/2015 01:39    EKG-sinus rhythm diffuse low voltage. No acute finding  10:48 AM- pt  feels "refreshed" after drinking GI cocktail. Pain has resolved.     Assessment & Plan:   [facility] Kim [name] is a 45 y.o. female Chest pain, unspecified chest pain type - Plan: gi cocktail (Maalox,Lidocaine,Donnatal), DG Chest 2 View, omeprazole (PRILOSEC) 20 MG capsule, D-dimer, quantitative (not at [facility])  SOB (shortness of breath) - Plan: EKG 12-Lead  History of pulmonary embolism - Plan: gi cocktail (Maalox,Lidocaine,Donnatal), D-dimer, quantitative (not at [facility])  History of esophageal reflux  Heartburn - Plan: omeprazole (PRILOSEC) 20 MG capsule  History of reflux, remote history of PE after pregnancy. Onset of chest pain approximately midnight last night. No associated symptoms. No preceding calf pain or swelling. No known PE risk factors recently. Last surgery was a few months ago. Relief of symptoms with GI cocktail indicates most likely reflux.  -Will check d-dimer, but less likely PE.  -Omeprazole daily, avoid trigger foods.  -ER/RTC precautions discussed  for chest pain.  -Language barrier, Spanish spoken and translator present. Understanding expressed.  Meds ordered this encounter  Medications  . HYDROcodone-acetaminophen (NORCO) 10-325 MG tablet    Sig:     Refill:  0  . ranitidine (ZANTAC) 150 MG capsule    Sig: Take 150 mg by mouth 2 (two) times daily.  Marland Kitchen gi cocktail (Maalox,Lidocaine,Donnatal)    Sig:   . omeprazole (PRILOSEC) 20 MG capsule    Sig: Take 1 capsule (20 mg total) by mouth daily.    Dispense:  30 capsule    Refill:  1   Patient Instructions       IF you received an x-ray today, you will receive an invoice from [facility]. Please contact [facility] at [phone] with questions or concerns regarding your invoice.   IF you received labwork today, you will receive an invoice from [employer]. Please contact [facility] at [phone] with questions or concerns regarding your invoice.   Our billing staff will not be able to assist you with questions regarding bills from these companies.  You will be contacted with the lab results as soon as they are available. The fastest way to get your results is to activate your My Chart account. Instructions are located on the last page of this paperwork. If you have not heard from [place] regarding the results in 2 weeks, please contact this office.    As symptoms improved with GI cocktail, I suspect your chest pain was from reflux or heartburn. See foods to avoid below, and start omeprazole in place of Zantac. I will check a blood clot test, and if this is elevated, you will need to be seen in the emergency room. If any worsening chest pain, call 911 or go to emergency room.   Opciones de alimentos para pacientes con reflujo gastroesofgico - Adultos (Food Choices for Gastroesophageal Reflux Disease, Adult) Cuando se tiene reflujo gastroesofgico (ERGE), los alimentos que se ingieren y los hbitos de alimentacin son muy importantes.  Elegir los alimentos adecuados puede ayudar a [employer]  molestias ocasionadas por el ERGE. [place]?  Elija las frutas, los vegetales, los cereales integrales, los productos lcteos, la carne de [place], de pescado y de ave con bajo contenido de grasas.  Limite las grasas, como los [place], los aderezos para [place], la [place], los frutos secos y Publishing copy.  Lleve un registro de las comidas para identificar los alimentos que ocasionan sntomas.  Evite los alimentos que le ocasionen reflujo. Pueden ser distintos para cada persona.  Haga comidas pequeas con frecuencia en lugar de tres comidas Kellogg.  Coma lentamente, en un clima distendido.  Limite el consumo de alimentos fritos.  Cocine los alimentos utilizando mtodos que no sean la fritura.  Evite el consumo alcohol.  Evite beber grandes cantidades de lquidos con las comidas.  Evite agacharse o recostarse hasta despus de 2 o 3horas de haber comido. QU ALIMENTOS NO SE RECOMIENDAN? Los siguientes son algunos alimentos y bebidas que pueden empeorar los sntomas: Astronomer. Jugo de tomate. Salsa de tomate y espagueti. Ajes. [place] y [place]. Rbano picante. Frutas [place]aran[place], pomelos y limn (fruta y [place]). Carnes Carnes de [place], de pescado y de ave con gran contenido de grasas. Esto incluye los perros calientes, las [place], el [place], la salchicha, el salame y el tocino. Lcteos Leche entera y [place]. R[employer] Aid. Crema. [place]. Helados. Queso crema.  Bebidas Caf y t negro, con o sin cafena Bebidas gaseosas o energizantes. Condimentos Salsa picante. Salsa barbacoa.  Dulces/postres Chocolate y cacao. [place]. Menta y mentol. Grasas y Unisys Corporation con alto contenido de grasas, incluidas las papas fritas. Otros [place]. Especias picantes, como la Solectron Corporation, la pimienta blanca, la pimienta roja, la pimienta de cayena, el curry en [place],  los clavos de [place], el jengibre y el [place] en polvo. Los artculos mencionados arriba pueden no ser Dean Foods Company de las bebidas y los alimentos que se Higher education careers adviser. Comunquese con el nutricionista para recibir ms informacin.   Esta informacin no tiene Marine scientist el consejo del mdico. Asegrese de hacerle al mdico cualquier pregunta que tenga.   Document Released: 01/12/2005 Document Revised: 04/25/2014 Elsevier Interactive Patient Education 2016 [place] de pecho inespecfico  (Nonspecific Chest Pain) El dolor de pecho puede deberse a muchas enfermedades diferentes. Siempre existe una posibilidad de que el dolor est relacionado con algo grave, como un infarto de miocardio o un cogulo sanguneo en los pulmones. Hay muchas enfermedades que no son potencialmente mortales que pueden causar dolor de [place]. Si tiene Social research officer, government de Engineer, building services, es muy importante que se controle con el mdico. CAUSAS  Las causas del dolor de pecho pueden ser las siguientes:  Acidez estomacal.  Neumona o bronquitis.  Ansiedad o estrs.  Inflamacin de la zona que rodea al corazn (pericarditis) o a los pulmones (pleuritis o pleuresa).  Un cogulo sanguneo en el pulmn.  Colapso de un pulmn (neumotrax), que puede aparecer de [employer] Services repentina por s solo (neumotrax espontneo) o debido a un traumatismo en el trax.  Culebrilla (virus de la varicela zster).  Infarto de miocardio.  Dao de los [place], los msculos y los cartlagos que conforman la pared torcica. Esto puede incluir lo siguiente:  Hematomas seos debido a lesiones.  Distensiones musculares o de los cartlagos por tos frecuente o repetida, o por exceso de trabajo.  Fractura de una o ms costillas.  Dolor de Database administrator debido a inflamacin (costocondritis). [place] de riesgo de Patent attorney  de pecho pueden incluir lo siguiente:  Actividades que incrementan el riesgo de sufrir  traumatismos o lesiones en el trax.  Infecciones o enfermedades respiratorias que causan tos frecuente.  Enfermedades o [employer] comidas que pueden causar Geographical information systems officer.  Enfermedades cardacas o antecedentes familiares de enfermedades cardacas.  Enfermedades o comportamientos de salud que aumentan el riesgo de tener un cogulo sanguneo.  Haber tenido varicela (varicela zster). SIGNOS Y SNTOMAS El dolor de pecho puede provocar las siguientes sensaciones:  Ardor u hormigueo en la superficie o en lo profundo del pecho.  Dolor opresivo, continuo o constrictivo.  Dolor vago o intenso que empeora al [employer] Communications, toser o inhalar profundamente.  Dolor que tambin se siente en la espalda, el cuello, el hombro o el brazo, o dolor que se irradia a cualquiera de estas zonas. El dolor de pecho puede aparecer y Armed forces operational officer, o bien puede ser constante. DIAGNSTICO Ileene Hutchinson se necesiten anlisis de laboratorio u otros estudios para Animator causa del Social research officer, government. El mdico puede indicarle que se haga una prueba llamada EGC (electrocadiograma) ambulatorio. El Radio broadcast assistant los patrones de los latidos cardacos en el momento en que se realiza el [place]. Tambin pueden hacerle otros estudios, por ejemplo:  Ecocardiograma transtorcico (ETT). Durante el ecocardiograma, se usan ondas sonoras para crear una imagen de todas las estructuras cardacas y evaluar cmo circula la sangre por el corazn.  Ecocardiograma transesofgico (ETE).Este es un estudio de diagnstico por imgenes ms avanzado que el obtiene imgenes del interior del cuerpo. Le permite al mdico ver el corazn con mayor detalle.  Monitoreo cardaco. Permite que el mdico controle la frecuencia y el ritmo cardaco en tiempo real.  Monitor Holter. Es un dispositivo porttil que Albertson's latidos del corazn y puede ayudar a Retail buyer las arritmias cardacas. Le permite al MeadWestvaco registrar la actividad [place], si es necesario.  Pruebas de esfuerzo. Estas pueden realizarse durante el ejercicio o mediante la administracin de un medicamento que acelera los latidos del corazn.  Anlisis de [place].  Diagnstico por imgenes. TRATAMIENTO  El tratamiento depende de la causa del dolor de [place]. El tratamiento puede incluir lo siguiente:  Medicamentos. Estos pueden incluir lo siguiente:  Inhibidores de Psychologist, forensic.  Antiinflamatorios.  Analgsicos para las enfermedades inflamatorias.  Antibiticos, si hay una infeccin.  Medicamentos para [employer] Inc[employer]  Medicamentos para tratar la enfermedad arterial coronaria.  Tratamiento complementario para las enfermedades que no requieren la toma de medicamentos. Esto puede incluir lo siguiente:  Descansar.  Aplicar compresas fras o calientes en las zonas lesionadas.  Limitar las actividades hasta que UnumProvident. INSTRUCCIONES PARA EL CUIDADO EN EL HOGAR  Si le recetaron antibiticos, asegrese de terminarlos, incluso si comienza a sentirse mejor.  Evite las CIT Group causen dolor de [place].  No consuma ningn producto que contenga tabaco, lo que incluye cigarrillos, tabaco de Higher education careers adviser o Psychologist, sport and exercise. Si necesita ayuda para dejar de fumar, consulte al mdico.  No beba alcohol.  Tome los medicamentos solamente como se lo haya indicado el mdico.  Concurra a todas las visitas de control como se lo haya indicado el mdico. Esto es importante. Esto incluye otros estudios si el dolor de pecho no desaparece.  Si la acidez es la causa del dolor de [place], tal vez le aconsejen que mantenga la cabeza levantada (elevada) mientras duerme. Esto reduce la probabilidad de que el cido retroceda del estmago al esfago.  Haga cambios en su estilo de vida como  se lo haya indicado el mdico. Estos pueden incluir lo siguiente:  Psychologist, prison and probation services actividad fsica con regularidad. Pida al mdico que le sugiera  algunas actividades que sean seguras para usted.  Consumir una dieta cardiosaludable. Un nutricionista matriculado puede ayudarlo a Adult nurse saludables.  Mantener un peso saludable.  Controlar la diabetes, si es necesario.  Reducir las situaciones de estrs. SOLICITE ATENCIN MDICA SI:  El dolor de pecho no desaparece despus del tratamiento.  Tiene una erupcin cutnea con ampollas en el pecho.  Tiene fiebre. SOLICITE ATENCIN MDICA DE INMEDIATO SI:   El dolor en el pecho es ms intenso.  La tos empeora, o expectora sangre.  Siente un dolor abdominal intenso.  Siente debilidad intensa.  Se desmaya.  Tiene escalofros.  Tiene una molestia repentina e inexplicable en el pecho.  Tiene molestias repentinas e Winn-Dixie, la espalda, el cuello o la [place].  Le falta el aire en cualquier momento.  Comienza a sudar de [place] repentina o la piel se le humedece.  Siente nuseas o vomita.  Se siente repentinamente mareado o se desmaya.  Siente que el corazn comienza a latir rpidamente o que se saltea latidos. Estos sntomas pueden representar un problema grave que constituye Engineer, maintenance (IT). No espere hasta que los sntomas desaparezcan. Solicite atencin mdica de inmediato. Comunquese con el servicio de emergencias de su localidad (911 en los [place]). No conduzca por sus propios medios [employer].   Esta informacin no tiene Marine scientist el consejo del mdico. Asegrese de hacerle al mdico cualquier pregunta que tenga.   Document Released: 04/04/2005 Document Revised: 04/25/2014 Elsevier Interactive Patient Education [employer].   At end of visit as patient received AVS, states that she's also had some itching in her neck for the past few weeks. No apparent rash on exam, no apparent urticaria. Advised to try over-the-counter cortisone, but follow-up to discuss this further if symptoms persist.  I personally  performed the services described in this documentation, which was scribed in my presence. The recorded information has been reviewed and considered, and addended by me as needed.   Signed,   [name], MD [facility].  10/16/2015 10:56 AM

## 2015-10-16 NOTE — ED Notes (Signed)
Pt c/o central chest pain x 1 hr. States it feels like a burning sensation. States she had a tooth pulled today and has not eaten since. Endorses hx acid reflux.

## 2015-10-16 NOTE — ED Notes (Signed)
Pt also c/o itching to her neck x 2 weeks.

## 2015-10-16 NOTE — ED Notes (Signed)
Pts name called for a room no answer 

## 2015-10-16 NOTE — Patient Instructions (Addendum)
IF you received an x-ray today, you will receive an invoice from [facility] Radiology. Please contact [facility] at [phone] with questions or concerns regarding your invoice.   IF you received labwork today, you will receive an invoice from [employer]. Please contact [facility] at [phone] with questions or concerns regarding your invoice.   Our billing staff will not be able to assist you with questions regarding bills from these companies.  You will be contacted with the lab results as soon as they are available. The fastest way to get your results is to activate your My Chart account. Instructions are located on the last page of this paperwork. If you have not heard from [place] regarding the results in 2 weeks, please contact this office.    As symptoms improved with GI cocktail, I suspect your chest pain was from reflux or heartburn. See foods to avoid below, and start omeprazole in place of Zantac. I will check a blood clot test, and if this is elevated, you will need to be seen in the emergency room. If any worsening chest pain, call 911 or go to emergency room.   Opciones de alimentos para pacientes con reflujo gastroesofgico - Adultos (Food Choices for Gastroesophageal Reflux Disease, Adult) Cuando se tiene reflujo gastroesofgico (ERGE), los alimentos que se ingieren y los hbitos de alimentacin son muy importantes. Elegir los alimentos adecuados puede ayudar a Public house manager las molestias ocasionadas por el [place]. [place]?  Elija las frutas, los vegetales, los cereales integrales, los productos lcteos, la carne de [place], de pescado y de ave con bajo contenido de grasas.  Limite las grasas, como los [place], los aderezos para [place], la [place], los frutos secos y Publishing copy.  Lleve un registro de las comidas para identificar los alimentos que ocasionan sntomas.  Evite los alimentos que le ocasionen reflujo.  Pueden ser distintos para cada persona.  Haga comidas pequeas con frecuencia en lugar de tres comidas Kellogg.  Coma lentamente, en un clima distendido.  Limite el consumo de alimentos fritos.  Cocine los alimentos utilizando mtodos que no sean la fritura.  Evite el consumo alcohol.  Evite beber grandes cantidades de lquidos con las comidas.  Evite agacharse o recostarse hasta despus de 2 o 3horas de haber comido. QU ALIMENTOS NO SE RECOMIENDAN? Los siguientes son algunos alimentos y bebidas que pueden empeorar los sntomas: Astronomer. Jugo de tomate. Salsa de tomate y espagueti. Ajes. Cebolla y [place]. Rbano picante. Frutas Naranjas, pomelos y limn (fruta y [place]). Carnes Carnes de [place], de pescado y de ave con gran contenido de grasas. Esto incluye los perros calientes, las [place], el [place], la salchicha, el salame y el tocino. Lcteos Leche entera y [place]. Rite Aid. Crema. [place]. Helados. Queso crema.  Bebidas Caf y t negro, con o sin cafena Bebidas gaseosas o energizantes. Condimentos Salsa picante. Salsa barbacoa.  Dulces/postres Chocolate y cacao. Rosquillas. Menta y mentol. Grasas y Unisys Corporation con alto contenido de grasas, incluidas las papas fritas. Otros Vinagre. Especias picantes, como la Solectron Corporation, la pimienta blanca, la pimienta roja, la pimienta de cayena, el curry en [place], los clavos de [place], el jengibre y el [place] en polvo. Los artculos mencionados arriba pueden no ser Dean Foods Company de las bebidas y los alimentos que se Higher education careers adviser. Comunquese con el nutricionista para recibir ms informacin.   Esta informacin no tiene Marine scientist el consejo del mdico. Asegrese de  hacerle al mdico cualquier pregunta que tenga.   Document Released: 01/12/2005 Document Revised: 04/25/2014 Elsevier Interactive Patient Education 2016 [place] de pecho inespecfico   (Nonspecific Chest Pain) El dolor de pecho puede deberse a muchas enfermedades diferentes. Siempre existe una posibilidad de que el dolor est relacionado con algo grave, como un infarto de miocardio o un cogulo sanguneo en los pulmones. Hay muchas enfermedades que no son potencialmente mortales que pueden causar dolor de [place]. Si tiene Social research officer, government de Engineer, building services, es muy importante que se controle con el mdico. CAUSAS  Las causas del dolor de pecho pueden ser las siguientes:  Acidez estomacal.  Neumona o bronquitis.  Ansiedad o estrs.  Inflamacin de la zona que rodea al corazn (pericarditis) o a los pulmones (pleuritis o pleuresa).  Un cogulo sanguneo en el pulmn.  Colapso de un pulmn (neumotrax), que puede aparecer de [employer] Services repentina por s solo (neumotrax espontneo) o debido a un traumatismo en el trax.  Culebrilla (virus de la varicela zster).  Infarto de miocardio.  Dao de los [place], los msculos y los cartlagos que conforman la pared torcica. Esto puede incluir lo siguiente:  Hematomas seos debido a lesiones.  Distensiones musculares o de los cartlagos por tos frecuente o repetida, o por exceso de trabajo.  Fractura de una o ms costillas.  Dolor de Database administrator debido a inflamacin (costocondritis). FACTORES DE RIESGO  Los factores de riesgo de tener dolor de pecho pueden incluir lo siguiente:  Actividades que incrementan el riesgo de sufrir traumatismos o lesiones en el trax.  Infecciones o enfermedades respiratorias que causan tos frecuente.  Enfermedades o [employer] comidas que pueden causar Geographical information systems officer.  Enfermedades cardacas o antecedentes familiares de enfermedades cardacas.  Enfermedades o comportamientos de salud que aumentan el riesgo de tener un cogulo sanguneo.  Haber tenido varicela (varicela zster). SIGNOS Y SNTOMAS El dolor de pecho puede provocar las siguientes sensaciones:  Ardor u hormigueo en la superficie o en lo profundo del  pecho.  Dolor opresivo, continuo o constrictivo.  Dolor vago o intenso que empeora al [employer], toser o inhalar profundamente.  Dolor que tambin se siente en la espalda, el cuello, el hombro o el brazo, o dolor que se irradia a cualquiera de estas zonas. El dolor de pecho puede aparecer y Armed forces operational officer, o bien puede ser constante. DIAGNSTICO Ileene Hutchinson se necesiten anlisis de laboratorio u otros estudios para Animator causa del Social research officer, government. El mdico puede indicarle que se haga una prueba llamada EGC (electrocadiograma) ambulatorio. El Radio broadcast assistant los patrones de los latidos cardacos en el momento en que se realiza el [place]. Tambin pueden hacerle otros estudios, por ejemplo:  Ecocardiograma transtorcico (ETT). Durante el ecocardiograma, se usan ondas sonoras para crear una imagen de todas las estructuras cardacas y evaluar cmo circula la sangre por el corazn.  Ecocardiograma transesofgico (ETE).Este es un estudio de diagnstico por imgenes ms avanzado que el obtiene imgenes del interior del cuerpo. Le permite al mdico ver el corazn con mayor detalle.  Monitoreo cardaco. Permite que el mdico controle la frecuencia y el ritmo cardaco en tiempo real.  Monitor Holter. Es un dispositivo porttil que Albertson's latidos del corazn y puede ayudar a Retail buyer las arritmias cardacas. Le permite al [employer] registrar la actividad [place], si es necesario.  Pruebas de esfuerzo. Estas pueden realizarse durante el ejercicio o mediante la administracin de un medicamento que acelera los latidos del corazn.  Anlisis de [place].  Diagnstico por imgenes. TRATAMIENTO  El tratamiento depende de la causa del dolor de [place]. El tratamiento puede incluir lo siguiente:  Medicamentos. Estos pueden incluir lo siguiente:  Inhibidores de Psychologist, forensic.  Antiinflamatorios.  Analgsicos para las enfermedades inflamatorias.  Antibiticos, si hay una  infeccin.  Medicamentos para [employer]  Medicamentos para tratar la enfermedad arterial coronaria.  Tratamiento complementario para las enfermedades que no requieren la toma de medicamentos. Esto puede incluir lo siguiente:  Descansar.  Aplicar compresas fras o calientes en las zonas lesionadas.  Limitar las actividades hasta que UnumProvident. INSTRUCCIONES PARA EL CUIDADO EN EL HOGAR  Si le recetaron antibiticos, asegrese de terminarlos, incluso si comienza a sentirse mejor.  Evite las [employer] Group causen dolor de [place].  No consuma ningn producto que contenga tabaco, lo que incluye cigarrillos, tabaco de Higher education careers adviser o Psychologist, sport and exercise. Si necesita ayuda para dejar de fumar, consulte al mdico.  No beba alcohol.  Tome los medicamentos solamente como se lo haya indicado el mdico.  Concurra a todas las visitas de control como se lo haya indicado el mdico. Esto es importante. Esto incluye otros estudios si el dolor de pecho no desaparece.  Si la acidez es la causa del dolor de [place], tal vez le aconsejen que mantenga la cabeza levantada (elevada) mientras duerme. Esto reduce la probabilidad de que el cido retroceda del estmago al esfago.  Haga cambios en su estilo de vida como se lo haya indicado el mdico. Estos pueden incluir lo siguiente:  Psychologist, prison and probation services actividad fsica con regularidad. Pida al mdico que le sugiera algunas actividades que sean seguras para usted.  Consumir una dieta cardiosaludable. Un nutricionista matriculado puede ayudarlo a Adult nurse saludables.  Mantener un peso saludable.  Controlar la diabetes, si es necesario.  Reducir las situaciones de estrs. SOLICITE ATENCIN MDICA SI:  El dolor de pecho no desaparece despus del tratamiento.  Tiene una erupcin cutnea con ampollas en el pecho.  Tiene fiebre. SOLICITE ATENCIN MDICA DE INMEDIATO SI:   El dolor en el pecho es ms intenso.  La tos  empeora, o expectora sangre.  Siente un dolor abdominal intenso.  Siente debilidad intensa.  Se desmaya.  Tiene escalofros.  Tiene una molestia repentina e inexplicable en el pecho.  Tiene molestias repentinas e Winn[employer]Dixie, la espalda, el cuello o la [place].  Le falta el aire en cualquier momento.  Comienza a sudar de [place] repentina o la piel se le humedece.  Siente nuseas o vomita.  Se siente repentinamente mareado o se desmaya.  Siente que el corazn comienza a latir rpidamente o que se saltea latidos. Estos sntomas pueden representar un problema grave que constituye Engineer, maintenance (IT). No espere hasta que los sntomas desaparezcan. Solicite atencin mdica de inmediato. Comunquese con el servicio de emergencias de su localidad (911 en los [place]). No conduzca por sus propios medios [employer].   Esta informacin no tiene Marine scientist el consejo del mdico. Asegrese de hacerle al mdico cualquier pregunta que tenga.   Document Released: 04/04/2005 Document Revised: 04/25/2014 Elsevier Interactive Patient Education [employer].

## 2015-10-16 NOTE — ED Notes (Signed)
Pt's name called to recheck vitals no answer 

## 2015-10-17 ENCOUNTER — Telehealth: Payer: Self-pay | Admitting: Emergency Medicine

## 2015-10-17 NOTE — Telephone Encounter (Signed)
Attempted to reach with results Left message per [facility] [phone]

## 2015-10-17 NOTE — Telephone Encounter (Signed)
-----   Message from Wendie Agreste, MD sent at 10/16/2015  3:05 PM EDT ----- Call patient. Test for blood clot was negative. Symptoms should continue to improve with acid blocker and avoidance of foods that cause reflux. However if any increased chest pain or shortness of breath, should be seen at emergency room as discussed at office visit earlier today. Let me know if there are any questions.

## 2015-10-19 ENCOUNTER — Other Ambulatory Visit: Payer: Self-pay | Admitting: Internal Medicine

## 2015-10-19 ENCOUNTER — Encounter: Payer: Self-pay | Admitting: Internal Medicine

## 2015-10-19 ENCOUNTER — Ambulatory Visit (INDEPENDENT_AMBULATORY_CARE_PROVIDER_SITE_OTHER): Payer: BLUE CROSS/BLUE SHIELD | Admitting: Internal Medicine

## 2015-10-19 VITALS — BP 110/79 | HR 62 | Temp 98.5°F | Ht 65.0 in | Wt 193.3 lb

## 2015-10-19 DIAGNOSIS — K219 Gastro-esophageal reflux disease without esophagitis: Secondary | ICD-10-CM

## 2015-10-19 DIAGNOSIS — R079 Chest pain, unspecified: Secondary | ICD-10-CM

## 2015-10-19 DIAGNOSIS — R109 Unspecified abdominal pain: Secondary | ICD-10-CM | POA: Diagnosis not present

## 2015-10-19 DIAGNOSIS — L299 Pruritus, unspecified: Secondary | ICD-10-CM

## 2015-10-19 HISTORY — DX: Gastro-esophageal reflux disease without esophagitis: K21.9

## 2015-10-19 LAB — POCT URINALYSIS DIPSTICK
Bilirubin, UA: NEGATIVE
Blood, UA: NEGATIVE
Glucose, UA: NEGATIVE
Ketones, UA: NEGATIVE
Nitrite, UA: NEGATIVE
Protein, UA: NEGATIVE
Spec Grav, UA: 1.02
Urobilinogen, UA: 0.2
pH, UA: 6

## 2015-10-19 NOTE — Progress Notes (Signed)
CC: "Chest pain" HPI: Kim [name] is a 45 y.o. female with a h/o of pregnancy related PE who presents with symptoms of chest discomfort. She reports that she began to experience mild chest pain ~3 days ago and presented to the ED for concern of PE on Friday 10/16/15. She was not seen by an MD due to the long wait and presented to the Urgent Care clinic where she received EKG, CEs, d-dimer, and GI cocktail. She reports that symptoms were relieved by GI cocktail, but she was unable to receive results of bloodwork. She presents today with symptoms that are improved by still present. She describes mild chest "pressure" without TTP or pleurisy. She has continued to the PPI as rx'd by Urgent Care. Her primary concern is to r/o serious event given h/o PE.  She denies any radiation of pain, sweats, SOB, DOE, cough/hemoptysis. She also denies any changes to BM and abd pain.  On ROS, Kim [name] notes some flank pain on the right which began in the AM 2d ago. She reports similar episodes of pain which were dx'd as probably renal calculi. She drank lots of water over the next several days with resolution in symptoms. She denies any urinary urgency, dysuria, or blood in the urine. Pain is almost resolved today. She denies radiation of pain and fevers.  ROS also revealed pruritis of the neck which was not associated with any rash or skin changes. Symptoms intermittent and mild, not affecting ADLs. She notes similar episodes in the past on extremities and believes these may be related to sun exposure. She has tried PO Benedryl w/o significant resolution of sx.   Past Medical History  Diagnosis Date  . 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 Asthma     when pregnant/ once 6 yrs ago only time asthma attack the dr said  . Gestational diabetes   . Cancer ([place])   . Renal insufficiency   . Pulmonary embolism ([place])   . Clotting  disorder ([place])   . Vitamin D deficiency    Current Outpatient Rx  Name  Route  Sig  Dispense  Refill  . omeprazole (PRILOSEC) 20 MG capsule   Oral   Take 1 capsule (20 mg total) by mouth daily.   30 capsule   1   . Vitamin D, Ergocalciferol, (DRISDOL) 50000 units CAPS capsule   Oral   Take 1 capsule (50,000 Units total) by mouth every 7 (seven) days.   12 capsule   0   . HYDROcodone-acetaminophen (NORCO) 10-325 MG tablet      Reported on 10/19/2015      0   . ranitidine (ZANTAC) 150 MG capsule   Oral   Take 150 mg by mouth 2 (two) times daily. Reported on 10/19/2015           Social Hx: Patient's social hx was assessed. Pt is a non-smoker. Pt has no significant social risk factors.  Review of Systems: A complete ROS was negative except as per HPI.  Physical Exam: Filed Vitals:   10/19/15 0935  BP: 110/79  Pulse: 62  Temp: 98.5 F (36.9 C)  TempSrc: Oral  Height: 5\' 5"  (1.651 m)  Weight: 193 lb 4.8 oz (87.68 kg)  SpO2: 100%   General appearance: alert, cooperative, appears stated age and appearing concerned Back: symmetric, no curvature. ROM normal. Mild CVA tenderness on the  right. Lungs: clear to auscultation bilaterally and normal percussion bilaterally Heart: regular rate and rhythm, S1, S2 normal, no murmur, click, rub or gallop. No TTP over ant chest wall Abdomen: soft, non-distended, mild TTP suprapubic Extremities: extremities normal, atraumatic, no cyanosis or edema Skin: Skin color, texture, turgor normal. No rashes or lesions except several striae associated with itching located on the ant neck  Assessment & Plan:  See encounters tab for problem based medical decision making. Patient seen with Dr. Daryll Drown  Signed: Holley Raring, MD 10/19/2015, 11:14 AM  Pager: [phone]

## 2015-10-19 NOTE — Assessment & Plan Note (Signed)
Probably related to sun exposure per pt. Not associated with rash. No recent exposures to potential allergens. Does not appear eczematous. Intermittent and not affecting ADLs. Will advise OTC moisturizer or anti-itch lotion.

## 2015-10-19 NOTE — Assessment & Plan Note (Signed)
Sx are consistent with previous episodes of renal calculus. Resolving with hydration. Not associated with changes to urine, fever, radiation of pain. No dysuria to suggest infection. POCT UA negative for blood, positive for trace [place]. In asymp young female, with advise no tx with Abx. Continue hydration.

## 2015-10-19 NOTE — Assessment & Plan Note (Signed)
DDx is broad for vague CP including angina and ACC, PE, and GERD. However, in the setting of mild symptoms resolving with GI cocktail since Sat, and negative EKG/CEs and d-dimer 2d ago, GERD is most likely. Will advise continued management with PPI. Also provided guidance on dietary management. Sx improved today and VSS w/o indication for repeat laboratory or EKG testing. Advised to f/u PRN for increased severity of sx or SOB/DOE given h/o PE several years ago.

## 2015-10-19 NOTE — Patient Instructions (Addendum)
I have reviewed the test results from the Urgent Care Clinic you visited on Saturday. Your blood testing and EKG are reassuring that your symptoms are not due to a problem with your heart.  Please continue to take your Omeprazole for symptoms of heart burn. It may be helpful to take this medication every day until your symptoms resolve. After this, you may take the medication only when you experience symptoms of heart burn. Please also avoid any fatty or spicy foods when you are experiencing symptoms as these may make your discomfort worse. I have included more information below about what foods to avoid.  For the symptoms of itching, you may try an over-the-counter moisturizing cream which may soothe the irritation. There are several brands which you can ask your pharmacist about, two options I would recommend are Aquaphor or Cetaphil.  Please call our clinic if you experience an increase in the severity of your symptoms and you should return to the Emergency Room if you experience chest pain associated with shortness of breath.  We have preformed a urine test today that demonstrates no signs of serious problems. Your symptoms may be associated with kidney stones, and I recommend continuing to drink plenty of water in order to pass any stones. Our test does not show signs of urinary tract infection.  Opciones de alimentos para pacientes con reflujo gastroesofgico - Adultos (Food Choices for Gastroesophageal Reflux Disease, Adult) Cuando se tiene reflujo gastroesofgico (ERGE), los alimentos que se ingieren y los hbitos de alimentacin son muy importantes. Elegir los alimentos adecuados puede ayudar a Public house manager las molestias ocasionadas por el [place]. [place]?  Elija las frutas, los vegetales, los cereales integrales, los productos lcteos, la carne de [place], de pescado y de ave con bajo contenido de grasas.  Limite las grasas, como los [place], los aderezos para [place], la  [place], los frutos secos y Publishing copy.  Lleve un registro de las comidas para identificar los alimentos que ocasionan sntomas.  Evite los alimentos que le ocasionen reflujo. Pueden ser distintos para cada persona.  Haga comidas pequeas con frecuencia en lugar de tres comidas Kellogg.  Coma lentamente, en un clima distendido.  Limite el consumo de alimentos fritos.  Cocine los alimentos utilizando mtodos que no sean la fritura.  Evite el consumo alcohol.  Evite beber grandes cantidades de lquidos con las comidas.  Evite agacharse o recostarse hasta despus de 2 o 3horas de haber comido. QU ALIMENTOS NO SE RECOMIENDAN? Los siguientes son algunos alimentos y bebidas que pueden empeorar los sntomas: Astronomer. Jugo de tomate. Salsa de tomate y espagueti. Ajes. Cebolla y [place]. Rbano picante. Frutas Naran[place], pomelos y limn (fruta y [place]). Carnes Carnes de [place], de pescado y de ave con gran contenido de grasas. Esto incluye los perros calientes, las [place], el [place], la salchicha, el salame y el tocino. Lcteos Leche entera y [place]. Rite Aid. Crema. [place]. Helados. Queso crema.  Bebidas Caf y t negro, con o sin cafena Bebidas gaseosas o energizantes. Condimentos Salsa picante. Salsa barbacoa.  Dulces/postres Chocolate y cacao. Ros[place]. Menta y mentol. Grasas y Unisys Corporation con alto contenido de grasas, incluidas las papas fritas. Otros Vinagre. Especias picantes, como la Solectron Corporation, la pimienta blanca, la pimienta roja, la pimienta de cayena, el curry en [place], los clavos de [place], el jengibre y el [place] en polvo. Los artculos mencionados arriba pueden no ser Dean Foods Company de las bebidas y los alimentos que  se deben evitar. Comunquese con el nutricionista para recibir ms informacin.   Esta informacin no tiene Marine scientist el consejo del mdico. Asegrese de hacerle al mdico cualquier  pregunta que tenga.   Document Released: 01/12/2005 Document Revised: 04/25/2014 Elsevier Interactive Patient Education [employer].

## 2015-10-26 NOTE — Addendum Note (Signed)
Addended by: Gilles Chiquito B on: 10/26/2015 09:57 AM   Modules accepted: Level of Service

## 2015-10-26 NOTE — Progress Notes (Signed)
Internal Medicine Clinic Attending  I saw and evaluated the patient.  I personally confirmed the key portions of the history and exam documented by Dr. Strelow and I reviewed pertinent patient test results.  The assessment, diagnosis, and plan were formulated together and I agree with the documentation in the resident's note. 

## 2015-10-29 NOTE — Telephone Encounter (Signed)
-----   Message from Wendie Agreste, MD sent at 10/16/2015  3:05 PM EDT ----- Call patient. Test for blood clot was negative. Symptoms should continue to improve with acid blocker and avoidance of foods that cause reflux. However if any increased chest pain or shortness of breath, should be seen at emergency room as discussed at office visit earlier today. Let me know if there are any questions.

## 2015-10-29 NOTE — Telephone Encounter (Signed)
LMOVM for pt to return call to clinic

## 2015-10-30 NOTE — Progress Notes (Signed)
Pt seen by [facility] Internal Medicine Clinic Resident on 7/3

## 2015-11-27 NOTE — Telephone Encounter (Signed)
The breast center has tried to call pt several times regarding the below, I have had claudia who is spanish speaking as well to call pt several times and leave several messages on her voicemail. Pt has yet to return the phone call from either office. To schedule imaging.

## 2015-12-04 NOTE — Telephone Encounter (Signed)
Certified letter sent to pt home address for the below.

## 2015-12-30 NOTE — Telephone Encounter (Signed)
Letter mailed back to our office stating certified letter is  unclaimed and unable to forward.

## 2016-01-07 ENCOUNTER — Encounter: Payer: Self-pay | Admitting: Gynecology

## 2016-01-12 ENCOUNTER — Other Ambulatory Visit: Payer: BLUE CROSS/BLUE SHIELD

## 2016-01-12 ENCOUNTER — Other Ambulatory Visit: Payer: Self-pay | Admitting: *Deleted

## 2016-01-12 ENCOUNTER — Encounter: Payer: Self-pay | Admitting: Gynecology

## 2016-01-12 ENCOUNTER — Ambulatory Visit (INDEPENDENT_AMBULATORY_CARE_PROVIDER_SITE_OTHER): Payer: BLUE CROSS/BLUE SHIELD | Admitting: Gynecology

## 2016-01-12 ENCOUNTER — Other Ambulatory Visit: Payer: Self-pay | Admitting: Anesthesiology

## 2016-01-12 VITALS — BP 128/84

## 2016-01-12 DIAGNOSIS — E559 Vitamin D deficiency, unspecified: Secondary | ICD-10-CM

## 2016-01-12 DIAGNOSIS — L259 Unspecified contact dermatitis, unspecified cause: Secondary | ICD-10-CM | POA: Diagnosis not present

## 2016-01-12 DIAGNOSIS — R102 Pelvic and perineal pain: Secondary | ICD-10-CM

## 2016-01-12 DIAGNOSIS — L292 Pruritus vulvae: Secondary | ICD-10-CM

## 2016-01-12 LAB — URINALYSIS W MICROSCOPIC + REFLEX CULTURE
Bilirubin Urine: NEGATIVE
Casts: NONE SEEN [LPF]
Crystals: NONE SEEN [HPF]
Glucose, UA: NEGATIVE
Hgb urine dipstick: NEGATIVE
Ketones, ur: NEGATIVE
Leukocytes, UA: NEGATIVE
Nitrite: NEGATIVE
Protein, ur: NEGATIVE
RBC / HPF: NONE SEEN RBC/HPF (ref <=2)
Specific Gravity, Urine: 1.025 (ref 1.001–1.035)
WBC, UA: NONE SEEN WBC/HPF (ref <=5)
Yeast: NONE SEEN [HPF]
pH: 5.5 (ref 5.0–8.0)

## 2016-01-12 LAB — WET PREP FOR TRICH, YEAST, CLUE
Clue Cells Wet Prep HPF POC: NONE SEEN
Trich, Wet Prep: NONE SEEN
Yeast Wet Prep HPF POC: NONE SEEN

## 2016-01-12 MED ORDER — CLOBETASOL PROPIONATE 0.05 % EX CREA: 1.0000 "application " | TOPICAL_CREAM | Freq: Two times a day (BID) | CUTANEOUS | 2 refills | Status: DC

## 2016-01-12 NOTE — Addendum Note (Signed)
Addended by: Thurnell Garbe A on: 01/12/2016 04:22 PM   Modules accepted: Orders

## 2016-01-12 NOTE — Progress Notes (Signed)
   HPI: Patient is a 45 year old who presented to the office today complaining on ongoing vulvar irritation. She sexually active. She had this issue several months ago and her partner is not using condoms because it did irritate her. She reports normal menstrual cycles. She does not douche. She denies any fever, chills, nausea, vomiting. No GU or GI complaints. She has complaining of low abdominal discomfort regardless of the time of month. Left side greater than her right.   ROS: A ROS was performed and pertinent positives and negatives are included in the history.  GENERAL: No fevers or chills. HEENT: No change in vision, no earache, sore throat or sinus congestion. NECK: No pain or stiffness. CARDIOVASCULAR: No chest pain or pressure. No palpitations. PULMONARY: No shortness of breath, cough or wheeze. GASTROINTESTINAL: No abdominal pain, nausea, vomiting or diarrhea, melena or bright red blood per rectum. GENITOURINARY: No urinary frequency, urgency, hesitancy or dysuria. MUSCULOSKELETAL: No joint or muscle pain, no back pain, no recent trauma. DERMATOLOGIC: No rash, no itching, no lesions. ENDOCRINE: No polyuria, polydipsia, no heat or cold intolerance. No recent change in weight. HEMATOLOGICAL: No anemia or easy bruising or bleeding. NEUROLOGIC: No headache, seizures, numbness, tingling or weakness. PSYCHIATRIC: No depression, no loss of interest in normal activity or change in sleep pattern.   PE: Blood pressure 128/84 Gen. appearance well-developed well-nourished female with the above-mentioned complaint Abdomen: Soft nontender no rebound or guarding Pelvic: Bartholin urethra Skene was within normal limits External genitalia: Questionable herpetic like area inferior portion of the left labia majora Vagina: No lesions or discharge Cervix no lesions or discharge Uterus: Anteverted normal size shape and consistency Right adnexa nontender no masses or tenderness Left adnexa tenderness  questionable fullness Rectal exam not done  Wet prep essentially negative few white blood cell few bacteria.   Urinalysis: 10-20 squamous epithelial cells no white blood cells no red blood cell culture pending    Assessment Plan: Patient with vulvar pruritus possible HSV. Culture was obtained. I'm going to start her on clobetasol 0.05% to apply to external genitalia twice a day for 1 week, the following week she will apply twice a week and then once a week thereafter on a when necessary basis. She was instructed to change on her types of undergarments that she uses and to avoid any perfumes or Tylox. Because of her left lower quadrant pain she'll return back to the office next week for an ultrasound for better assessment of her ovary since it was some guarding. We'll wait for the result of the urine culture as well. Patient did receive the flu vaccine today after counseling.    Greater than 50% of time was spent in counseling and coordinating care of this patient.   Time of consultation: 15   Minutes.

## 2016-01-12 NOTE — Patient Instructions (Addendum)
Influenza Virus Vaccine (Flucelvax) Qu es este medicamento? La VACUNA ANTIGRIPAL ayuda a disminuir el riesgo de contraer la influenza, tambin conocida como la gripe. La vacuna solo ayuda a protegerle contra algunas cepas de influenza. Este medicamento puede ser utilizado para otros usos; si tiene alguna pregunta consulte con su proveedor de atencin mdica o con su farmacutico. Qu le debo informar a mi profesional de la salud antes de tomar este medicamento? Necesita saber si usted presenta alguno de los siguientes problemas o situaciones: -trastorno de sangrado como hemofilia -fiebre o infeccin -sndrome de Guillain-Barre u otros problemas neurolgicos -problemas del sistema inmunolgico -infeccin por el virus de la inmunodeficiencia humana (VIH) o SIDA -niveles bajos de plaquetas en la sangre -esclerosis mltiple -una reaccin alrgica o inusual a las vacunas antigripales, a otros medicamentos, alimentos, colorantes o conservantes -si est embarazada o buscando quedar embarazada -si est amamantando a un beb Cmo debo utilizar este medicamento? Esta vacuna se administra mediante inyeccin por va intramuscular. Lo administra un profesional de [employer]	[place]. Recibir una copia de informacin escrita sobre la vacuna antes de cada vacuna. Asegrese de leer este folleto cada vez cuidadosamente. Este folleto puede cambiar con frecuencia. Hable con su pediatra para informarse acerca del uso de este medicamento en nios. Puede requerir atencin especial. Sobredosis: Pngase en contacto inmediatamente con un centro toxicolgico o una sala de urgencia si usted cree que haya tomado demasiado medicamento. ATENCIN: [employer] es solo para usted. No comparta este medicamento con nadie. Qu sucede si me olvido de una dosis? No se aplica en este caso. Qu puede interactuar con este medicamento? -quimioterapia o radioterapia -medicamentos que suprimen el sistema inmunolgico, tales como  etanercept, anakinra, infliximab y adalimumab -medicamentos que tratan o previenen cogulos sanguneos, como warfarina -fenitona -medicamentos esteroideos, como la prednisona o la cortisona -teofilina -vacunas Puede ser que esta lista no menciona todas las posibles interacciones. Informe a su profesional de KB Home	[place] de [employer] productos a base de hierbas, medicamentos de [place] o suplementos nutritivos que est tomando. Si usted fuma, consume bebidas alcohlicas o si utiliza drogas ilegales, indqueselo tambin a su profesional de KB Home	[place]. Algunas sustancias pueden interactuar con su medicamento. A qu debo estar atento al usar Coca[employer]Cola? Informe a su mdico o a Barrister's clerk de la [employer] todos los efectos secundarios que persistan despus de 3 das. Llame a su proveedor de atencin mdica si se presentan sntomas inusuales dentro de las 6 semanas de recibir esta vacuna. Es posible que todava pueda contraer la gripe, pero la enfermedad no ser tan fuerte como normalmente. No puede contraer la gripe de esta vacuna. La vacuna antigripal no le protege contra resfros u otras enfermedades que pueden causar [place]. Debe vacunarse cada ao. Qu efectos secundarios puedo tener al [employer] este medicamento? Efectos secundarios que debe informar a su mdico o a Barrister's clerk de la salud tan pronto como sea posible: -reacciones alrgicas como erupcin cutnea, picazn o urticarias, hinchazn de la cara, labios o lengua Efectos secundarios que, por lo general, no requieren atencin mdica (debe informarlos a su mdico o a su profesional de la salud si persisten o si son molestos): -fiebre -dolor de cabeza -molestias y dolores musculares -dolor, sensibilidad, enrojecimiento o Estate agent de la inyeccin -cansancio Puede ser que esta lista no menciona todos los posibles efectos secundarios. Comunquese a su mdico por asesoramiento mdico [employer]. Usted  puede informar los efectos secundarios a la FDA por telfono al 1-800-FDA-1088. Dnde  debo guardar mi medicina? Esta vacuna se administrar por un profesional de la salud en una [place], Engineer, mining, consultorio mdico u otro consultorio de un profesional de la salud. No se le suministrar esta vacuna para guardar en su domicilio. ATENCIN: Este folleto es un resumen. Puede ser que no cubra toda la posible informacin. Si usted tiene preguntas acerca de esta medicina, consulte con su mdico, su farmacutico o su profesional de Technical sales engineer.    2016, Elsevier/Gold Standard. (2011-03-21 16:29:16)  Clobetasol Propionate skin cream Qu es este medicamento? El CLOBETASOL es un corticosteroide. Se utiliza sobre la piel para tratar los problemas de la piel que estn acompaados de picazn, enrojecimiento e hinchazn. Este medicamento puede ser utilizado para otros usos; si tiene alguna pregunta consulte con su proveedor de atencin mdica o con su farmacutico. Qu le debo informar a mi profesional de la salud antes de tomar este medicamento? Necesita saber si usted presenta alguno de los siguientes problemas o situaciones: -algn tipo de infeccin activa, incluyendo sarampin, tuberculosis, herpes o varicela -problemas circulatorios o enfermedad vascular -grandes zonas de piel quemada o con lesiones -roscea -desgaste o adelgazamiento de la piel -una reaccin alrgica o inusual al clobetasol, a los corticosteroides, a otros medicamentos, alimentos, colorantes o conservantes -si est embarazada o buscando quedar embarazada -si est amamantando a un beb Cmo debo utilizar este medicamento? Este medicamento es slo para uso externo. No lo ingiera por va oral. Siga las instrucciones de la etiqueta del medicamento. Lvese las manos antes y despus de usarlo. Aplique una pequea capa sobre las zonas afectadas. No la cubra con un vendaje o apsito a menos que se lo indique su mdico o su profesional de Marketing executive. Evite que el medicamento entre en contacto con sus ojos. Si esto ocurre, enjuguelos con abundante agua fra del grifo. Es importante que no utilice ms medicamento que lo indicado. No utilice su medicamento con una frecuencia mayor a la indicada. Si lo hace aumentarn las probabilidades de sufrir efectos secundarios. Hable con su pediatra para informarse acerca del uso de este medicamento en nios. Puede requerir Sales executive. Los pacientes de edad avanzada son ms propensos a sufrir lesiones en la piel debido al envejecimiento y Air traffic controller puede aumentar los efectos secundarios. Este medicamento debe utilizarse solamente durante perodos breves y con muy poca frecuencia en pacientes de edad avanzada. Sobredosis: Pngase en contacto inmediatamente con un centro toxicolgico o una sala de urgencia si usted cree que haya tomado demasiado medicamento. ATENCIN: [employer] es solo para usted. No comparta este medicamento con nadie. Qu sucede si me olvido de una dosis? Si olvida una dosis, sela lo antes posible. Si es casi la hora de la prxima dosis, aplique slo esa dosis. No use dosis adicionales o dobles. Qu puede interactuar con este medicamento? No se esperan interacciones. No utilice cosmticos u otros productos para la piel sobre la zona que est siendo tratada. Puede ser que esta lista no menciona todas las posibles interacciones. Informe a su profesional de [employer] Home	[place] de [employer] productos a base de hierbas, medicamentos de [place] o suplementos nutritivos que est tomando. Si usted fuma, consume bebidas alcohlicas o si utiliza drogas ilegales, indqueselo tambin a su profesional de [employer] Home	[place]. Algunas sustancias pueden interactuar con su medicamento. A qu debo estar atento al usar Coca-Cola? Si los sntomas no mejoran dentro de 2 semanas o si experimenta irritacin de la piel, informe a su mdico o a su profesional de KB Home	[place]. Informe a  su mdico o su profesional de la  salud si est en contacto con personas con sarampin o varicela, o si desarrolla llagas o ampollas que no se curan bien. Qu efectos secundarios puedo tener al [employer] este medicamento? Efectos secundarios que debe informar a su mdico o a Barrister's clerk de la salud tan pronto como sea posible: -Chief of Staff como erupcin cutnea, picazn o urticarias, hinchazn de la cara, labios o lengua -cambios en la visin -cuando el problema de la piel no se cura -formacin de ampollas llenas de pus, rojas y dolorosas en la piel o en los folculos pilosos -adelgazamiento de la piel y fcil formacin de magulladuras Efectos secundarios que, por lo general, no requieren atencin mdica (debe informarlos a su mdico o a su profesional de la salud si persisten o si son molestos): -ardor, irritacin de la piel -enrojecimiento o descamacin de la piel Puede ser que esta lista no menciona todos los posibles efectos secundarios. Comunquese a su mdico por asesoramiento mdico [employer]. Usted puede informar los efectos secundarios a la FDA por telfono al 1-800-FDA-1088. Dnde debo guardar mi medicina? [place]ngala fuera del alcance de los nios. Gurdela a [employer], entre 15 y 64 grados C (12 y 53 grados F). Mantngala lejos del calor y de la luz directa. No la congele. Deseche todo el medicamento que no haya utilizado, despus de la fecha de vencimiento. ATENCIN: Este folleto es un resumen. Puede ser que no cubra toda la posible informacin. Si usted tiene preguntas acerca de esta medicina, consulte con su mdico, su farmacutico o su profesional de Technical sales engineer.    2016, Elsevier/Gold Standard. (2014-05-27 00:00:00)  Dermatitis de contacto (Contact Dermatitis) La dermatitis es el enrojecimiento, el dolor y la hinchazn (inflamacin) de la piel. La dermatitis de contacto es una reaccin a ciertas sustancias que entran en contacto con la piel. Hay dos tipos de dermatitis de  contacto:   Dermatitis de contacto irritativa. La causa de este tipo de dermatitis es algo que irrita la piel, como las manos secas por lavarlas en exceso. Este tipo no requiere la exposicin previa a la sustancia que caus la reaccin. Este tipo es ms frecuente.  Dermatitis alrgica por contacto. La causa de este tipo de dermatitis es una sustancia a la cual se es Air cabin crew, como una alergia al nquel o a la hiedra venenosa. Este tipo solo ocurre si ha estado expuesto anteriormente a la sustancia (alrgeno). Al repetir la exposicin, el organismo reacciona a la sustancia. Este tipo es menos frecuente. CAUSAS  Muchas sustancias diferentes pueden causar dermatitis de contacto. La causa ms frecuente de la dermatitis de contacto irritativa es la exposicin a lo siguiente:   Maquillaje.   Jabones perfumados.   Detergentes.   Lavandina.   cidos.   Sales metlicas, como el nquel.  Las causas de la dermatitis alrgica son las siguientes:   Plantas venenosas.   Productos qumicos.   Alhajas.   Ltex.   Medicamentos.   Conservantes que se utilizan en determinados productos, como la ropa.  FACTORES DE RIESGO Es ms probable que Personnel officer se manifieste en:   Las personas que tienen trabajos que las exponen a irritantes o a Futures trader.  Las [employer] tienen determinadas enfermedades, por ejemplo, asma o eccema.  SNTOMAS  Los sntomas de esta afeccin pueden presentarse en cualquier parte del cuerpo con la que usted toque el irritante o donde la sustancia irritante lo haya tocado. Algunos sntomas son los siguientes:  [place] o  descamacin.   Enrojecimiento.   Grietas.   Picazn.   Dolor o sensacin de ardor.   Ampollas.  Secrecin de pequeas cantidades de sangre o de lquido transparente que emanan de las grietas de la piel. En el caso de la dermatitis de Risk manager, puede haber hinchazn solo en algunas partes del cuerpo, como la boca o  los genitales.  DIAGNSTICO  Esta afeccin se diagnostica mediante la historia clnica y un examen fsico. Se puede realizar una prueba del parche para ayudar a Office manager causa. Si la afeccin guarda relacin con Leander Rams, tal vez deba consultar a un especialista en medicina ocupacional. TRATAMIENTO El tratamiento de esta afeccin incluye determinar la causa de la reaccin y proteger la piel de nuevos contactos. El tratamiento tambin puede incluir lo siguiente:   [place]remas o ungentos con corticoides. En los casos ms graves ser necesario aplicar corticoides por va oral.  Ungentos con antibiticos o antibacterianos, si hay una infeccin en la piel.  Antihistamnicos en forma de locin o por va oral para calmar la picazn.  Un vendaje. INSTRUCCIONES PARA EL CUIDADO EN EL HOGAR Cuidado de la piel  Humctese la piel segn sea necesario.   Aplique compresas fras en las zonas afectadas.  Trate de tomar un bao con lo siguiente:  Sales de [place]. Siga las instrucciones del envase. Puede conseguirlas en la tienda de comestibles o la farmacia local.  Bicarbonato de sodio. Vierta un poco en la baera como se lo haya indicado el [place] instrucciones del envase. Puede conseguirla en la tienda de comestibles o la farmacia local.  Intente colocarse una pasta de bicarbonato de sodio sobre la piel. Agregue agua al bicarbonato hasta que tenga la consistencia de una pasta.  No se rasque la piel.  Bese con menos frecuencia, por ejemplo, [employer].  Bese con agua templada. No use agua caliente. [place] o aplquese los medicamentos de venta libre y recetados solamente como se lo haya indicado el mdico.   Si le recetaron un antibitico, tmelo o aplqueselo como se lo haya indicado el mdico. No deje de usar el antibitico aunque la afeccin empiece a Teacher, English as a foreign language. Instrucciones generales  Concurra a todas las visitas de control como se lo  haya indicado el mdico. Esto es importante.  Evite la sustancia que ha causado la erupcin. Si no sabe qu la caus, lleve un diario para tratar de identificar la causa. Escriba los siguientes datos:  Lo que come.  Los cosmticos que [place].  Lo que bebe.  Lo que llev puesto en la zona afectada. [place] alhajas.  Si le indicaron que use un vendaje, cudelo como se lo haya indicado el mdico. Esto incluye saber cundo cambiarlo y cundo quitrselo. SOLICITE ATENCIN MDICA SI:   La afeccin no mejora con tratamiento.  La afeccin empeora.  Observa signos de infeccin, como hinchazn, sensibilidad, enrojecimiento, dolor o calor en la zona afectada.  Tiene fiebre.  Aparecen nuevos sntomas. SOLICITE ATENCIN MDICA DE INMEDIATO SI:   Tiene dolor de cabeza intenso, dolor o rigidez en el cuello.  Vomita.  Se siente muy somnoliento.  Nota una lnea roja en la piel que sale de la zona afectada.  El hueso o la articulacin que se encuentran por debajo de la zona afectada le duelen despus de que la piel se haya curado.  La zona afectada se oscurece.  Tiene dificultad para respirar.   Esta informacin no tiene Psychologist, clinical  consejo del mdico. Asegrese de hacerle al mdico cualquier pregunta que tenga.   Document Released: 01/12/2005 Document Revised: 12/24/2014 Elsevier Interactive Patient Education [employer].

## 2016-01-13 LAB — VITAMIN D 25 HYDROXY (VIT D DEFICIENCY, FRACTURES): Vit D, 25-Hydroxy: 22 ng/mL — ABNORMAL LOW (ref 30–100)

## 2016-01-14 LAB — HERPES SIMPLEX VIRUS CULTURE: Organism ID, Bacteria: NOT DETECTED

## 2016-01-15 LAB — URINE CULTURE

## 2016-01-18 ENCOUNTER — Telehealth: Payer: Self-pay | Admitting: *Deleted

## 2016-01-18 MED ORDER — HALOBETASOL PROPIONATE 0.05 % EX CREA: TOPICAL_CREAM | CUTANEOUS | 2 refills | Status: DC

## 2016-01-18 NOTE — Telephone Encounter (Signed)
Pt insurance company denied coverage for temovate cream 0.05 % pt will need to have alternative medication such as augmented betamethasone cream/ointment 0.05% or halobetasol 0.05% cream/ointment

## 2016-01-18 NOTE — Telephone Encounter (Signed)
You can call and Ultravate 0.05% which is generic and apply twice a day for 7-10 days then once a week when necessary. #1-2 refills

## 2016-01-18 NOTE — Telephone Encounter (Signed)
Rx sent 

## 2016-01-19 NOTE — Progress Notes (Signed)
Left message for patient to call on mobile number.

## 2016-01-21 ENCOUNTER — Other Ambulatory Visit: Payer: Self-pay | Admitting: Gynecology

## 2016-01-21 DIAGNOSIS — E559 Vitamin D deficiency, unspecified: Secondary | ICD-10-CM

## 2016-01-21 MED ORDER — AMPICILLIN 500 MG PO CAPS: 500.0000 mg | ORAL_CAPSULE | Freq: Two times a day (BID) | ORAL | 0 refills | Status: DC

## 2016-01-21 MED ORDER — VITAMIN D (ERGOCALCIFEROL) 1.25 MG (50000 UNIT) PO CAPS: ORAL_CAPSULE | ORAL | 11 refills | Status: DC

## 2016-01-27 ENCOUNTER — Ambulatory Visit: Payer: BLUE CROSS/BLUE SHIELD | Admitting: Gynecology

## 2016-01-27 ENCOUNTER — Other Ambulatory Visit: Payer: BLUE CROSS/BLUE SHIELD

## 2016-01-27 DIAGNOSIS — Z0289 Encounter for other administrative examinations: Secondary | ICD-10-CM

## 2016-01-28 ENCOUNTER — Ambulatory Visit (INDEPENDENT_AMBULATORY_CARE_PROVIDER_SITE_OTHER): Payer: BLUE CROSS/BLUE SHIELD | Admitting: Family Medicine

## 2016-01-28 VITALS — BP 120/72 | HR 63 | Temp 97.6°F | Resp 15 | Ht 65.0 in | Wt 185.4 lb

## 2016-01-28 DIAGNOSIS — L600 Ingrowing nail: Secondary | ICD-10-CM

## 2016-01-28 MED ORDER — DOXYCYCLINE HYCLATE 100 MG PO CAPS: 100.0000 mg | ORAL_CAPSULE | Freq: Two times a day (BID) | ORAL | 0 refills | Status: DC

## 2016-01-28 NOTE — Patient Instructions (Addendum)
Please follow the below toe nail care instructions. Siga las instrucciones de cuidado de las uas del dedo del pie debajo.   Regreso para Tour manager de heridas en 10 das. Comience Doxiciclina 100 mg [place] [address] Le he referido a Building surveyor para la evaluacin del pie.   UA ENCARNADA . Mantenga el rea limpia, seca y vendada por 24 horas. Marland Kitchen Despus de 24 horas, retire la venda externa y djela amarilla gasa en su lugar . Sumerja los pies / pies en agua tibia y jabn durante 5-10 minutos, una [place] 5 das. Rebao del dedo del pie despus de cada limpieza. . Contine empapndose hasta que caiga la gasa [place]. . Notifique a la oficina si experimenta alguno de los siguientes signos de infeccin: hinchazn, enrojecimiento, drenaje de pus, rayaduras, fiebre> 101.0 F    [place] TOENAIL . Keep area clean, dry and bandaged for 24 hours. . After 24 hours, remove outer bandage and leave yellow gauze in place. Domingo Madeira toe/foot in warm soapy water for 5-10 minutes, once daily for 5 days. Rebandage toe after each cleaning. . Continue soaks until yellow gauze falls off. . Notify the office if you experience any of the following signs of infection: Swelling, redness, pus drainage, streaking, fever > 101.0 F    Return for woundcare follow-up in 10 days. Start Doxycycline 100 mg twice daily for 10 days. I have referred you to Podiatry for foot evaluation.   ,      IF you received an x-ray today, you will receive an invoice from [facility]. Please contact [facility] at [phone] with questions or concerns regarding your invoice.   IF you received labwork today, you will receive an invoice from [employer]. Please contact [facility] at [phone] with questions or concerns regarding your invoice.   Our billing staff will not be able to assist you with questions regarding bills from these  companies.  You will be contacted with the lab results as soon as they are available. The fastest way to get your results is to activate your My Chart account. Instructions are located on the last page of this paperwork. If you have not heard from [place] regarding the results in 2 weeks, please contact this office.

## 2016-01-28 NOTE — Progress Notes (Signed)
Patient ID: [facility] Kim [name], female    DOB: 1971-01-21, 45 y.o.   MRN: [id number]  PCP: Dellia Nims, MD  Chief Complaint  Patient presents with  . Ingrown Toenail    right great toe     Subjective:  [id number] Kim [name] -Spanish interpreter   HPI 45 year old female presents for evaluation of ingrown toe nail. Right great ingrown nail. Nail doesn't grow straight and continue to result in an ingrown toe nail. Surrounding skin of the toe is very red, swollen, and tender to touch. She reports that her toe nail has been removed at least twice and it continues to grow back at an angle. Wishes to have toenail removed today and is agreeable to seeing a foot specialist for further evaluation.  Review of Systems HPI Patient Active Problem List   Diagnosis Date Noted  . Itching of the skin (neck) 10/19/2015  . GERD (gastroesophageal reflux disease) 10/19/2015  . Mastodynia, female 09/25/2015  . Acute upper back pain 07/10/2015  . Dysuria 06/11/2015  . Allergic rhinitis 06/10/2015  . Flank pain 05/25/2015  . Healed or old pulmonary embolism 02/24/2015  . Biliary colic 0000000  . Need for immunization against influenza 01/28/2015  . Post partum depression 01/05/2015  . Dizziness 12/29/2014  . Pain, pelvic, female 11/24/2014  . Sciatic pain 11/24/2014  . Hx of pulmonary embolus during pregnancy 11/09/2014  . Hx of gestational diabetes mellitus, not currently pregnant      Prior to Admission medications   Medication Sig Start Date End Date Taking? Authorizing Provider  omeprazole (PRILOSEC) 20 MG capsule Take 1 capsule (20 mg total) by mouth daily. 10/16/15  Yes Wendie Agreste, MD  Vitamin D, Ergocalciferol, (DRISDOL) 50000 units CAPS capsule Take one capsule EVERY OTHER WEEK. 01/21/16  Yes Terrance Mass, MD  No Known Allergies     Objective:  Physical Exam  Constitutional: She is oriented to person, place, and time. She appears well-developed and well-nourished.    HENT:  Head: Normocephalic and atraumatic.  Right Ear: External ear normal.  Left Ear: External ear normal.  Nose: Nose normal.  Eyes: Conjunctivae are normal. Pupils are equal, round, and reactive to light.  Cardiovascular: Normal rate.   Pulmonary/Chest: Effort normal.  Musculoskeletal: She exhibits edema.  Neurological: She is alert and oriented to person, place, and time.  Skin: Skin is warm and dry. There is erythema.  Erythema present surrounding the great toe  Psychiatric: She has a normal mood and affect. Her behavior is normal. Judgment and thought content normal.   Toe Nail Removal Procedure Verbal Consent Obtained. Left great toe wiped with alcohol prep pad, then digital block with 5 cc of 2% Xylocaine  Cleansed and prep with Betadine.  Sterile drape applied. Complete Great Toenail lifted and excised, in its entirety. Proximal aspect of nail bed explored revealing no nail remnants. Hemostasis achieved. Xeroform dressing applied. Cleansed and dressed. Wound care instructions including precautions reviewed with patient.  Vitals:   01/28/16 0912  BP: 120/72  Pulse: 63  Resp: 15  Temp: 97.6 F (36.4 C)    Assessment & Plan:  1. IGTN (ingrowing toe nail), surrounding skin of the toe nail is erythematous and edematous and likely infected.   Plan: . doxycycline (VIBRAMYCIN) 100 MG capsule    Sig: Take 1 capsule (100 mg total) by mouth 2 (two) times daily.    Dispense:  20 capsule   - Ambulatory referral to Podiatry  Return for wound recheck  follow-up in 10 days.  Carroll Sage. Kenton Kingfisher, MSN, FNP-C [facility]

## 2016-01-29 ENCOUNTER — Telehealth: Payer: Self-pay | Admitting: Family Medicine

## 2016-01-29 NOTE — Telephone Encounter (Signed)
Called patient via the interperter service and got her voicemail. Advised patient to call back. She was given the incorrect woundcare instructions for her ingrown toe removal. If she calls back please provide her with instructions as follows.  INGROWN TOENAIL . Keep area clean, dry and bandaged for 24 hours. . After 24 hours, remove outer bandage and leave yellow gauze in place. Domingo Madeira toe/foot in warm soapy water for 5-10 minutes, once daily for 5 days. Rebandage toe after each cleaning. . Continue soaks until yellow gauze falls off. . Notify the office if you experience any of the following signs of infection: Swelling, redness, pus drainage, streaking, fever > 101.0 F  I will also send here these instructions via mail in [place]. She should return for follow-up in 10 days. Continue antibiotic treatment.

## 2016-02-01 ENCOUNTER — Ambulatory Visit (INDEPENDENT_AMBULATORY_CARE_PROVIDER_SITE_OTHER): Payer: BLUE CROSS/BLUE SHIELD | Admitting: Internal Medicine

## 2016-02-01 VITALS — BP 114/61 | HR 70 | Temp 98.4°F | Ht 65.0 in | Wt 187.5 lb

## 2016-02-01 DIAGNOSIS — L600 Ingrowing nail: Secondary | ICD-10-CM

## 2016-02-01 DIAGNOSIS — L03031 Cellulitis of right toe: Secondary | ICD-10-CM | POA: Insufficient documentation

## 2016-02-01 MED ORDER — IBUPROFEN 800 MG PO TABS: 800.0000 mg | ORAL_TABLET | Freq: Three times a day (TID) | ORAL | 0 refills | Status: DC | PRN

## 2016-02-01 NOTE — Assessment & Plan Note (Signed)
HPI Patient is presenting with R big toe pain and swelling. States she went to an urgent care center 4 days ago for removal of an ingrown toenail and her pain and swelling have been present since the procedure. She is currently taking Doxycycline 100 mg twice daily. Reports noticing serosanguinous drainage initially which then progressed to pus drainage 2 days ago. Denies having any fevers, chills, nausea, or vomiting. States this is the 3rd episode of an ingrown toe nail on the same toe.   Assessment Ingrown toe nail of R big toe. Area is mildly erythematous and swollen but does not appear to be draining blood or pus at present. No systemic signs of infection.   Plan -Advised patient to continue taking Doxycycline for a full 10 day course -Ibuprofen 800 mg tid prn pain/ inflammation  -Daily dressing changes. Keep the area clean and dry.  -Urgent podiatry referral  -RTC in 1 week

## 2016-02-01 NOTE — Progress Notes (Signed)
   CC: Patient is complaining of pain and swelling in her right big toe.   HPI:  [name] is a 45 y.o. F with a PMHx of conditions listed below presenting to the clinic complaining of pain and swelling in her R big toe. Please see problem based charting for the status of the patient's current and chronic medical conditions.   Past Medical History:  Diagnosis Date  . Abnormal Pap smear    patient states she had cancer that was removed from her cervix  . Asthma    when pregnant/ once 6 yrs ago only time asthma attack the dr said  . Cancer ([place])   . Clotting disorder ([place])   . Gestational diabetes   . Pulmonary embolism ([place])   . Renal insufficiency   . Vitamin D deficiency     Review of Systems:  Pertinent positives mentioned in HPI. Remainder of all ROS negative.   Physical Exam:  Vitals:   02/01/16 1045  BP: 114/61  Pulse: 70  Temp: 98.4 F (36.9 C)  TempSrc: Oral  SpO2: 100%  Weight: 187 lb 8 oz (85 kg)  Height: 5\' 5"  (1.651 m)   Physical Exam  Constitutional: She is oriented to person, place, and time. She appears well-developed and well-nourished. No distress.  HENT:  Head: Normocephalic and atraumatic.  Mouth/Throat: Oropharynx is clear and moist.  Eyes: EOM are normal.  Neck: Neck supple. No tracheal deviation present.  Cardiovascular: Normal rate, regular rhythm and intact distal pulses.   Pulmonary/Chest: Effort normal and breath sounds normal. No respiratory distress.  Musculoskeletal: Normal range of motion.  Gauze dressing stuck to the R big toe nailbed. Gauze covered with crusted blood and pus. After area was irrigated with saline and dressing removed, no active blood or pus drainage noted.  Decreased ROM of R big toe 2/2 pain. Toe is slightly erythematous and swollen.   Neurological: She is alert and oriented to person, place, and time.  Skin: Skin is warm and dry.    Assessment & Plan:   See Encounters Tab for problem based  charting.  Patient discussed with Dr. Eppie Gibson

## 2016-02-01 NOTE — Patient Instructions (Addendum)
[name] it was nice meeting you today.  -Continue taking Doxycycline as instructed to finish a full 10 day course.  -Take Ibuprofen 800 mg one tablet by mouth three times a day as needed for pain.  -Do dressing changes everyday. Keep the area dry and clean.   -I have referred you to podiatry. Our office will schedule the appointment for you.   -Return for a follow-up in 1 week.     Dressing Change A dressing is a material placed over wounds. It keeps the wound clean, dry, and protected from further injury. This provides an environment that favors wound healing.  BEFORE YOU BEGIN  Get your supplies together. Things you may need include:  Saline solution.  Flexible gauze dressing.  Medicated cream.  Tape.  Gloves.  Abdominal dressing pads.  Gauze squares.  Plastic bags.  Take pain medicine 30 minutes before the dressing change if you need it.  Take a shower before you do the first dressing change of the day. Use plastic wrap or a plastic bag to prevent the dressing from getting wet. REMOVING YOUR OLD DRESSING   Wash your hands with soap and water. Dry your hands with a clean towel.  Put on your gloves.  Remove any tape.  Carefully remove the old dressing. If the dressing sticks, you may dampen it with warm water to loosen it, or follow your caregiver's specific directions.  Remove any gauze or packing tape that is in your wound.  Take off your gloves.  Put the gloves, tape, gauze, or any packing tape into a plastic bag. CHANGING YOUR DRESSING  Open the supplies.  Take the cap off the saline solution.  Open the gauze package so that the gauze remains on the inside of the package.  Put on your gloves.  Clean your wound as told by your caregiver.  If you have been told to keep your wound dry, follow those instructions.  Your caregiver may tell you to do one or more of the following:  Pick up the gauze. Pour the saline solution over the gauze.  Squeeze out the extra saline solution.  Put medicated cream or other medicine on your wound if you have been told to do so.  Put the solution soaked gauze only in your wound, not on the skin around it.  Pack your wound loosely or as told by your caregiver.  Put dry gauze on your wound.  Put abdominal dressing pads over the dry gauze if your wet gauze soaks through.  Tape the abdominal dressing pads in place so they will not fall off. Do not wrap the tape completely around the affected part (arm, leg, abdomen).  Wrap the dressing pads with a flexible gauze dressing to secure it in place.  Take off your gloves. Put them in the plastic bag with the old dressing. Tie the bag shut and throw it away.  Keep the dressing clean and dry until your next dressing change.  Wash your hands. SEEK MEDICAL CARE IF:  Your skin around the wound looks red.  Your wound feels more tender or sore.  You see pus in the wound.  Your wound smells bad.  You have a fever.  Your skin around the wound has a rash that itches and burns.  You see black or yellow skin in your wound that was not there before.  You feel nauseous, throw up, and feel very tired.   This information is not intended to replace advice  given to you by your health care provider. Make sure you discuss any questions you have with your health care provider.   Document Released: 05/12/2004 Document Revised: 06/27/2011 Document Reviewed: 02/14/2011 Elsevier Interactive Patient Education [employer].

## 2016-02-08 ENCOUNTER — Ambulatory Visit: Payer: BLUE CROSS/BLUE SHIELD

## 2016-02-14 NOTE — Progress Notes (Signed)
Case discussed with Dr. Rathore at the time of the visit.  We reviewed the resident's history and exam and pertinent patient test results.  I agree with the assessment, diagnosis, and plan of care documented in the resident's note. 

## 2016-02-15 ENCOUNTER — Encounter: Payer: Self-pay | Admitting: Internal Medicine

## 2016-02-15 ENCOUNTER — Ambulatory Visit: Payer: BLUE CROSS/BLUE SHIELD | Admitting: Podiatry

## 2016-02-15 ENCOUNTER — Ambulatory Visit (INDEPENDENT_AMBULATORY_CARE_PROVIDER_SITE_OTHER): Payer: BLUE CROSS/BLUE SHIELD | Admitting: Internal Medicine

## 2016-02-15 DIAGNOSIS — R202 Paresthesia of skin: Secondary | ICD-10-CM

## 2016-02-15 DIAGNOSIS — R200 Anesthesia of skin: Secondary | ICD-10-CM

## 2016-02-15 LAB — GLUCOSE, CAPILLARY: Glucose-Capillary: 84 mg/dL (ref 65–99)

## 2016-02-15 LAB — POCT GLYCOSYLATED HEMOGLOBIN (HGB A1C): Hemoglobin A1C: 5.1

## 2016-02-15 NOTE — Assessment & Plan Note (Addendum)
Has acute numbness and tingling of both hands and feet that progresses to pain. Started 3-4 days ago suddenly. No inciting events.  Unclear etiology but will work this up as a new polyneuropathy.  Will order hgba1c, HIV, Hep C, ESR, vitamin B12, ANA. Had RPR and TSH checked in the past and was normal.   Will have her take iburprofen prn in case there is component of plantar fascitis contributing to feet pain. Will not commit her to lyrica or gabapentin for now.

## 2016-02-15 NOTE — Progress Notes (Signed)
   CC: numbness of b/l hands and feet.   HPI:  Ms.Meisha Montes [name] is a 45 y.o. with PMH as listed below is here for numbness of hands and feet.   Past Medical History:  Diagnosis Date  . Abnormal Pap smear    patient states she had cancer that was removed from her cervix  . Asthma    when pregnant/ once 6 yrs ago only time asthma attack the dr said  . Cancer ([place])   . Clotting disorder ([place])   . Gestational diabetes   . Pulmonary embolism ([place])   . Renal insufficiency   . Vitamin D deficiency    Hand and feet numbness at night. Wakes up from sleep feeling the numbness. movign them makes her feel little better. Also has pain on both feet mainly on the mid foot on the plantar aspect, hurts worst after waking up in the morning. Gets better with rest, worst with walking. These all started 3 days ago without any heavy exercise or any other inciting events that she can remember. No fever/chills, rash, joint pain.   Review of Systems  Constitutional: Negative for chills and fever.  Cardiovascular: Negative for chest pain.  Gastrointestinal: Negative for heartburn, nausea and vomiting.  Musculoskeletal:       Pain on hands and feet  Neurological: Negative for dizziness, focal weakness and headaches.     Physical Exam:  Vitals:   02/15/16 1540  BP: 122/69  Pulse: 64  Temp: 97.9 F (36.6 C)  TempSrc: Oral  SpO2: 100%  Weight: 190 lb 9.6 oz (86.5 kg)  Height: 5\' 5"  (1.651 m)   Physical Exam  Constitutional: She is oriented to person, place, and time. She appears well-developed and well-nourished. No distress.  HENT:  Head: Normocephalic and atraumatic.  Neck: Normal range of motion.  Cardiovascular: Exam reveals no gallop and no friction rub.   No murmur heard. Respiratory: Effort normal and breath sounds normal. No respiratory distress. She has no wheezes.  GI: Soft. Bowel sounds are normal. She exhibits no distension. There is no tenderness.  Musculoskeletal:  Normal range of motion. She exhibits no edema or deformity.  Has tenderness to palpation over the plantar fascia bilaterally. Good ROM. No edema. No skin break down.  On hand exam, there is no joint swelling of wrist or finger joints. Good ROM. Subjective tenderness over the palm.   Neurological: She is alert and oriented to person, place, and time. No cranial nerve deficit.  Skin: Skin is warm. She is not diaphoretic.  Psychiatric: She has a normal mood and affect. Her behavior is normal.    Assessment & Plan:   See Encounters Tab for problem based charting.  Patient discussed with Dr. Evette Doffing

## 2016-02-15 NOTE — Patient Instructions (Signed)
Will do some blood work to figure out why you have numbness/tingling.  Take ibuprofen as needed for pain.

## 2016-02-16 LAB — ANTINUCLEAR ANTIBODIES, IFA: ANA Titer 1: NEGATIVE

## 2016-02-16 LAB — SEDIMENTATION RATE: Sed Rate: 9 mm/hr (ref 0–32)

## 2016-02-16 LAB — HIV ANTIBODY (ROUTINE TESTING W REFLEX): HIV Screen 4th Generation wRfx: NONREACTIVE

## 2016-02-16 LAB — HEPATITIS C ANTIBODY: Hep C Virus Ab: 0.1 s/co ratio (ref 0.0–0.9)

## 2016-02-16 LAB — VITAMIN B12: Vitamin B-12: 535 pg/mL (ref 211–946)

## 2016-02-17 NOTE — Progress Notes (Signed)
Internal Medicine Clinic Attending  Case discussed with Dr. Ahmed at the time of the visit.  We reviewed the resident's history and exam and pertinent patient test results.  I agree with the assessment, diagnosis, and plan of care documented in the resident's note. 

## 2016-02-24 ENCOUNTER — Ambulatory Visit: Payer: BLUE CROSS/BLUE SHIELD | Admitting: Podiatry

## 2016-02-29 ENCOUNTER — Encounter: Payer: Self-pay | Admitting: Gynecology

## 2016-02-29 ENCOUNTER — Ambulatory Visit (INDEPENDENT_AMBULATORY_CARE_PROVIDER_SITE_OTHER): Payer: BLUE CROSS/BLUE SHIELD | Admitting: Gynecology

## 2016-02-29 ENCOUNTER — Ambulatory Visit (INDEPENDENT_AMBULATORY_CARE_PROVIDER_SITE_OTHER): Payer: BLUE CROSS/BLUE SHIELD

## 2016-02-29 VITALS — BP 116/70

## 2016-02-29 DIAGNOSIS — R102 Pelvic and perineal pain: Secondary | ICD-10-CM | POA: Diagnosis not present

## 2016-02-29 DIAGNOSIS — D251 Intramural leiomyoma of uterus: Secondary | ICD-10-CM

## 2016-02-29 HISTORY — DX: Intramural leiomyoma of uterus: D25.1

## 2016-02-29 NOTE — Progress Notes (Signed)
   Patient is a 45 year old presented to the office to discuss her ultrasound since when she was seen back in April she was having nonspecific low abdominal discomfort. She also was complaining of vulvar irritation when her husband used a condom in clobetasol had been prescribed to apply external genitalia which resolved her symptoms. She's currently not using any form of contraception rhythm method. She did not return for fasting blood work and will do so later this week she is here just to discuss the ultrasound. She was otherwise asymptomatic. Her recent Pap smear this year was normal and she scheduled for mammogram next few weeks.  Ultrasound: Uterus measured 8.2 x 6.8 x 4.6 cm with endometrial stripe of 10.4 mm a small intramural fibroid measuring 12 x 17 mm. Right ovarian follicle 10 mm, thick course luteum follicle 7 mm. No fluid in the cul-de-sac left ovary otherwise normal. No apparent adnexal masses.  Assessment/plan: Patient reassured she has a very small insignificant fibroid. Patient will return back to the office later this week in a fasting state for the following screening blood work: Fasting lipid profile, comprehensive metabolic panel, TSH, CBC, and urinalysis. She she had been complaining of muscle tiredness and fatigue and vitamin D level will be checked as well.  Greater than 90% of time was spent counseling coordinate care for this patient. Total time of consultation 10 minutes

## 2016-03-01 ENCOUNTER — Other Ambulatory Visit: Payer: BLUE CROSS/BLUE SHIELD

## 2016-03-01 ENCOUNTER — Other Ambulatory Visit: Payer: Self-pay | Admitting: Gynecology

## 2016-03-01 ENCOUNTER — Telehealth: Payer: Self-pay | Admitting: *Deleted

## 2016-03-01 DIAGNOSIS — Z01419 Encounter for gynecological examination (general) (routine) without abnormal findings: Secondary | ICD-10-CM

## 2016-03-01 DIAGNOSIS — E559 Vitamin D deficiency, unspecified: Secondary | ICD-10-CM

## 2016-03-01 DIAGNOSIS — R5383 Other fatigue: Secondary | ICD-10-CM

## 2016-03-01 DIAGNOSIS — M6289 Other specified disorders of muscle: Secondary | ICD-10-CM | POA: Diagnosis not present

## 2016-03-01 DIAGNOSIS — Z13220 Encounter for screening for lipoid disorders: Secondary | ICD-10-CM

## 2016-03-01 LAB — COMPREHENSIVE METABOLIC PANEL
ALT: 8 U/L (ref 6–29)
AST: 12 U/L (ref 10–35)
Albumin: 3.9 g/dL (ref 3.6–5.1)
Alkaline Phosphatase: 70 U/L (ref 33–115)
BUN: 12 mg/dL (ref 7–25)
CO2: 24 mmol/L (ref 20–31)
Calcium: 8.8 mg/dL (ref 8.6–10.2)
Chloride: 106 mmol/L (ref 98–110)
Creat: 0.45 mg/dL — ABNORMAL LOW (ref 0.50–1.10)
Glucose, Bld: 88 mg/dL (ref 65–99)
Potassium: 4.4 mmol/L (ref 3.5–5.3)
Sodium: 137 mmol/L (ref 135–146)
Total Bilirubin: 0.4 mg/dL (ref 0.2–1.2)
Total Protein: 6.8 g/dL (ref 6.1–8.1)

## 2016-03-01 LAB — LIPID PANEL
Cholesterol: 152 mg/dL (ref <200)
HDL: 48 mg/dL — ABNORMAL LOW (ref >50)
LDL Cholesterol: 88 mg/dL (ref <100)
Total CHOL/HDL Ratio: 3.2 Ratio (ref <5.0)
Triglycerides: 81 mg/dL (ref <150)
VLDL: 16 mg/dL (ref <30)

## 2016-03-01 LAB — CBC
HCT: 40.9 % (ref 35.0–45.0)
Hemoglobin: 13.7 g/dL (ref 11.7–15.5)
MCH: 30.4 pg (ref 27.0–33.0)
MCHC: 33.5 g/dL (ref 32.0–36.0)
MCV: 90.9 fL (ref 80.0–100.0)
MPV: 9.6 fL (ref 7.5–12.5)
Platelets: 274 10*3/uL (ref 140–400)
RBC: 4.5 MIL/uL (ref 3.80–5.10)
RDW: 13.5 % (ref 11.0–15.0)
WBC: 6.3 10*3/uL (ref 3.8–10.8)

## 2016-03-01 LAB — TSH: TSH: 1.49 mIU/L

## 2016-03-01 NOTE — Telephone Encounter (Signed)
Pt was told to return to office for screening labs on 02/29/16 office visit Fasting lipid profile, comprehensive metabolic panel, TSH, CBC, and urinalysis,orders placed.

## 2016-03-02 ENCOUNTER — Encounter: Payer: Self-pay | Admitting: Gynecology

## 2016-03-02 ENCOUNTER — Other Ambulatory Visit: Payer: Self-pay | Admitting: Anesthesiology

## 2016-03-02 DIAGNOSIS — R319 Hematuria, unspecified: Secondary | ICD-10-CM

## 2016-03-02 LAB — URINALYSIS W MICROSCOPIC + REFLEX CULTURE
Bacteria, UA: NONE SEEN [HPF]
Bilirubin Urine: NEGATIVE
Casts: NONE SEEN [LPF]
Crystals: NONE SEEN [HPF]
Glucose, UA: NEGATIVE
Hgb urine dipstick: NEGATIVE
Ketones, ur: NEGATIVE
Leukocytes, UA: NEGATIVE
Nitrite: NEGATIVE
Protein, ur: NEGATIVE
RBC / HPF: NONE SEEN RBC/HPF (ref <=2)
Specific Gravity, Urine: 1.02 (ref 1.001–1.035)
Yeast: NONE SEEN [HPF]
pH: 6.5 (ref 5.0–8.0)

## 2016-03-02 LAB — VITAMIN D 25 HYDROXY (VIT D DEFICIENCY, FRACTURES): Vit D, 25-Hydroxy: 28 ng/mL — ABNORMAL LOW (ref 30–100)

## 2016-03-03 LAB — URINE CULTURE

## 2016-03-14 ENCOUNTER — Encounter: Payer: Self-pay | Admitting: Podiatry

## 2016-03-14 ENCOUNTER — Ambulatory Visit (INDEPENDENT_AMBULATORY_CARE_PROVIDER_SITE_OTHER): Payer: BLUE CROSS/BLUE SHIELD | Admitting: Podiatry

## 2016-03-14 DIAGNOSIS — S91209S Unspecified open wound of unspecified toe(s) with damage to nail, sequela: Secondary | ICD-10-CM | POA: Diagnosis not present

## 2016-03-14 NOTE — Progress Notes (Signed)
Subjective:  Patient presents today for follow-up evaluation of a right great toenail. Patient states that approximately one month ago she had her toenail completely avulsed at a primary care doctor office. Patient states that she has had it removed on 2 different occasions. Patient states that she does have a history of ingrown toenails. Patient presents today for further treatment and evaluation     Objective/Physical Exam General: The patient is alert and oriented x3 in no acute distress.  Dermatology:  history of right great toenail avulsion. Appears healing as expected with the base of the right great toenail pink and viable. Skin is warm, dry and supple bilateral lower extremities. Negative for open lesions or macerations.  Vascular: Palpable pedal pulses bilaterally. No edema or erythema noted. Capillary refill within normal limits.  Neurological: Epicritic and protective threshold grossly intact bilaterally.   Musculoskeletal Exam: Range of motion within normal limits to all pedal and ankle joints bilateral. Muscle strength 5/5 in all groups bilateral.     Assessment: #1 History of right great toe nail avulsion   Plan of Care:  #1 Patient was evaluated. #2 Mechanical debridement of the right great toenail and subungual debris was performed using a nail nipper without incident or bleeding #3 return to clinic when necessary  Edrick Kins, DPM [facility]  Dr. Edrick Kins, [address]                                        [place], [place] [address]                Office [phone]  Fax [phone]

## 2016-03-16 ENCOUNTER — Other Ambulatory Visit: Payer: Self-pay | Admitting: Gynecology

## 2016-03-16 DIAGNOSIS — H52223 Regular astigmatism, bilateral: Secondary | ICD-10-CM | POA: Diagnosis not present

## 2016-03-16 DIAGNOSIS — H538 Other visual disturbances: Secondary | ICD-10-CM | POA: Diagnosis not present

## 2016-03-16 DIAGNOSIS — E559 Vitamin D deficiency, unspecified: Secondary | ICD-10-CM

## 2016-03-16 DIAGNOSIS — H5711 Ocular pain, right eye: Secondary | ICD-10-CM | POA: Diagnosis not present

## 2016-03-16 MED ORDER — NITROFURANTOIN MONOHYD MACRO 100 MG PO CAPS: 100.0000 mg | ORAL_CAPSULE | Freq: Two times a day (BID) | ORAL | 0 refills | Status: DC

## 2016-03-16 MED ORDER — VITAMIN D (ERGOCALCIFEROL) 1.25 MG (50000 UNIT) PO CAPS: 50000.0000 [IU] | ORAL_CAPSULE | ORAL | 0 refills | Status: DC

## 2016-03-31 ENCOUNTER — Other Ambulatory Visit: Payer: BLUE CROSS/BLUE SHIELD

## 2016-05-11 ENCOUNTER — Encounter: Payer: Self-pay | Admitting: *Deleted

## 2016-05-11 LAB — PROCEDURE REPORT - SCANNED: Pap: ABNORMAL — AB

## 2016-05-12 ENCOUNTER — Telehealth: Payer: Self-pay | Admitting: *Deleted

## 2016-05-12 ENCOUNTER — Ambulatory Visit (HOSPITAL_COMMUNITY)
Admission: RE | Admit: 2016-05-12 | Discharge: 2016-05-12 | Disposition: A | Discharge status: Home / Self Care | Payer: BLUE CROSS/BLUE SHIELD | Source: Ambulatory Visit | Attending: Internal Medicine | Admitting: Internal Medicine

## 2016-05-12 ENCOUNTER — Ambulatory Visit (INDEPENDENT_AMBULATORY_CARE_PROVIDER_SITE_OTHER): Payer: BLUE CROSS/BLUE SHIELD | Admitting: Internal Medicine

## 2016-05-12 VITALS — BP 115/66 | HR 71 | Temp 98.4°F | Ht 65.0 in | Wt 195.5 lb

## 2016-05-12 DIAGNOSIS — R918 Other nonspecific abnormal finding of lung field: Secondary | ICD-10-CM | POA: Insufficient documentation

## 2016-05-12 DIAGNOSIS — R079 Chest pain, unspecified: Secondary | ICD-10-CM | POA: Diagnosis not present

## 2016-05-12 DIAGNOSIS — Z86711 Personal history of pulmonary embolism: Secondary | ICD-10-CM | POA: Diagnosis not present

## 2016-05-12 DIAGNOSIS — R0789 Other chest pain: Secondary | ICD-10-CM

## 2016-05-12 DIAGNOSIS — C801 Malignant (primary) neoplasm, unspecified: Secondary | ICD-10-CM

## 2016-05-12 DIAGNOSIS — Z86718 Personal history of other venous thrombosis and embolism: Secondary | ICD-10-CM | POA: Diagnosis not present

## 2016-05-12 DIAGNOSIS — R071 Chest pain on breathing: Secondary | ICD-10-CM | POA: Insufficient documentation

## 2016-05-12 MED ORDER — IOPAMIDOL (ISOVUE-370) INJECTION 76%
100.0000 mL | Freq: Once | INTRAVENOUS | Status: AC | PRN
  Administered 2016-05-12: 100 mL via INTRAVENOUS

## 2016-05-12 MED ORDER — IOPAMIDOL (ISOVUE-370) INJECTION 76%
INTRAVENOUS | Status: DC
Start: 2016-05-12 — End: 2016-05-13
  Filled 2016-05-12: qty 100

## 2016-05-12 NOTE — Assessment & Plan Note (Signed)
She has left sided pleuritic pain ongoing since this morning. I suspect that this is musculoskeletal in nature especially with her also having left arm pain at the same time. However, with her recent travel air history and previous history of right sided PE in 2016 I cannot exclude this as a cause of her pleuritic chest pain today. She thinks this pain is different from her previous PE because there is no leg pain but pain on breathing was the complaint at that time as well. She is hemodynamically very stable without tachycardia, dyspnea, or hypotension.  The safest course of action for a moderate risk pretest probability of PE would be to perform CT angiography so we can treat appropriately on anticoagulation if indicated. We will arrange for CTA today. She can either be treated on a DOAC if indicated or we can manage her as a possible viral pleurisy or chest wall pain with antiinflammatory therapy and topical analgesia.  I discussed this plan with Kim [name] and she agreed with this workup of her left sided pain.

## 2016-05-12 NOTE — Progress Notes (Signed)
   CC: Left sided chest pain  HPI:  [name] is a 46 y.o. Spanish speaking woman here with left sided chest pain since this morning. She was feeling in usual health last night but woke up with pain in her left side and back that worsens with deep inspirations. It also worsens with lying to her left side. The pain has not improved during the day and she has not tried anything for this pain so far. She is not suffering any shortness of breath or palpitations while this is going on.Her recent history is significant for a recent travel to [place] with return one week ago. She did not have any complaints or problems during her stay or on the return trip.  She has had chest and back pains in the past intermittently for years. She had severe pleuritic chest and leg pain in 2016 after delivery that was caused by right sided PE with a LE DVT which was treated for 6 months. She never became hemodynamically unstable from this previous event.   See problem based assessment and plan below for additional details  Past Medical History:  Diagnosis Date  . Abnormal Pap smear    patient states she had cancer that was removed from her cervix  . Asthma    when pregnant/ once 6 yrs ago only time asthma attack the dr said  . Cancer ([place])   . Clotting disorder ([place])   . Gestational diabetes   . Pulmonary embolism ([place])   . Renal insufficiency   . Vitamin D deficiency   . Vitamin D deficiency     Review of Systems:  Review of Systems  Constitutional: Negative for chills and fever.  Respiratory: Negative for shortness of breath.   Cardiovascular: Positive for chest pain. Negative for palpitations and leg swelling.  Gastrointestinal: Negative for diarrhea.  Genitourinary: Negative for flank pain.  Musculoskeletal: Positive for joint pain.  Neurological: Positive for sensory change. Negative for dizziness and focal weakness.    Physical Exam: Physical Exam  Constitutional: She is  well-developed, well-nourished, and in no distress.  Eyes: Conjunctivae are normal.  Cardiovascular: Normal rate and regular rhythm.   Pulmonary/Chest:  High amount of left sided chest pain worsened with deep inspirations. No abnormal breath sounds appreciated. The pain is only reproducible to a lesser extent with direct palpation over the left axilla.  Musculoskeletal:  No lower extremity edema or tenderness. Left shoulder is nontender to palpation but produces pain on empty can, Neers test, abduction against resistance  Neurological:  Decreased sensation in the 1-4th digits of her left hand compared to the 5th digit Normal strength and ROM  Skin: Skin is warm and dry.  Psychiatric: Affect normal.    Vitals:   05/12/16 1102  BP: 115/66  Pulse: 71  Temp: 98.4 F (36.9 C)  TempSrc: Oral  SpO2: 100%  Weight: 195 lb 8 oz (88.7 kg)  Height: 5\' 5"  (1.651 m)    Assessment & Plan:   See Encounters Tab for problem based charting.  Patient discussed with Dr. Dareen Piano

## 2016-05-12 NOTE — Telephone Encounter (Signed)
Patient's daughter states patient is having pain in her lungs when she breaths. Asking to be seen today. Patient not in acute distress.  Appt today @ 1045 w/ acc.

## 2016-05-13 NOTE — Progress Notes (Signed)
Internal Medicine Clinic Attending  Case discussed with Dr. Benjamine Mola at the time of the visit.  We reviewed the resident's history and exam and pertinent patient test results.  I agree with the assessment, diagnosis, and plan of care documented in the resident's note.  CTA chest with no evidence of PE - likely MSK pain. Does have small stable lung nodules which may require a repeat CT chest in 12 months. No further work up for now

## 2016-05-25 ENCOUNTER — Other Ambulatory Visit: Payer: BLUE CROSS/BLUE SHIELD

## 2016-05-26 ENCOUNTER — Ambulatory Visit
Admission: RE | Admit: 2016-05-26 | Discharge: 2016-05-26 | Disposition: A | Discharge status: Home / Self Care | Payer: BLUE CROSS/BLUE SHIELD | Source: Ambulatory Visit | Attending: Gynecology | Admitting: Gynecology

## 2016-05-26 DIAGNOSIS — N644 Mastodynia: Secondary | ICD-10-CM | POA: Diagnosis not present

## 2016-05-26 DIAGNOSIS — R922 Inconclusive mammogram: Secondary | ICD-10-CM | POA: Diagnosis not present

## 2016-06-09 ENCOUNTER — Telehealth: Payer: Self-pay | Admitting: *Deleted

## 2016-06-09 MED ORDER — VITAMIN D (ERGOCALCIFEROL) 1.25 MG (50000 UNIT) PO CAPS: 50000.0000 [IU] | ORAL_CAPSULE | ORAL | 0 refills | Status: DC

## 2016-06-09 NOTE — Telephone Encounter (Signed)
Pt spoke with Kim [name] regarding vitamin d 5000 units Rx, states she went to [place] and left pills, will need a new Rx sent. Rx sent.

## 2016-06-30 ENCOUNTER — Encounter: Payer: Self-pay | Admitting: Internal Medicine

## 2016-07-01 ENCOUNTER — Ambulatory Visit (INDEPENDENT_AMBULATORY_CARE_PROVIDER_SITE_OTHER): Payer: BLUE CROSS/BLUE SHIELD | Admitting: Gynecology

## 2016-07-01 ENCOUNTER — Encounter: Payer: Self-pay | Admitting: Gynecology

## 2016-07-01 VITALS — BP 110/70

## 2016-07-01 DIAGNOSIS — R635 Abnormal weight gain: Secondary | ICD-10-CM

## 2016-07-01 DIAGNOSIS — N912 Amenorrhea, unspecified: Secondary | ICD-10-CM

## 2016-07-01 LAB — PREGNANCY, URINE: Preg Test, Ur: NEGATIVE

## 2016-07-01 NOTE — Progress Notes (Signed)
Patient ID: [facility] [name], female   DOB: 1971/01/12, 46 y.o.   MRN: [id number]    Patient is a 46 year old gravida 6 para 6 (normal spontaneous vaginal delivery) that presented to the office stating she has not had a menstrual cycle since February 5. She denies any nausea, vomiting, breast tenderness, nipple discharge, double vision, or headaches. She has been using withdrawal as a form of contraception. She is due for her annual exam in May of this year. She has complained some weight gain on and off.  Exam: Well-developed well-nourished female in no acute distress Abdomen: Soft nontender no rebound or guarding Pelvic: Bartholin urethra Skene was within normal limits Vagina: No lesions or discharge Cervix: No lesions or discharge Uterus: Anteverted normal size shape and consistency Adnexa: No palpable mass or tenderness Rectal exam: Not done  Urine precipitous negative  Assessment/plan: 46 year old with secondary amenorrhea possible anovulation attributed to thyroid or prolactin dysfunction or premature menopause? We will check also her TSH, prolactin and FSH. We should have the results next, day along with a qualitative hCG. If all the above tests are normal at given her prescription for Provera 10 mg to take 1 by mouth daily for 10 days. If she does not menstruate 1 week Of the last tablet she will return back to the office otherwise we will see her in May for annual exam.

## 2016-07-02 LAB — FOLLICLE STIMULATING HORMONE: FSH: 4.4 m[IU]/mL

## 2016-07-02 LAB — PROLACTIN: Prolactin: 4 ng/mL

## 2016-07-02 LAB — HCG, SERUM, QUALITATIVE: Preg, Serum: NEGATIVE

## 2016-07-02 LAB — TSH: TSH: 1.75 mIU/L

## 2016-07-29 IMAGING — DX DG CHEST 2V
2 series · 2 of 2 positions shown · non-contrast
Comparison: CT chest November 09, 2014

CLINICAL DATA: Abdominal pain, nausea, presyncope. RIGHT upper
quadrant pain. History of cancer.

EXAM:
CHEST  2 VIEW

[chest pa]
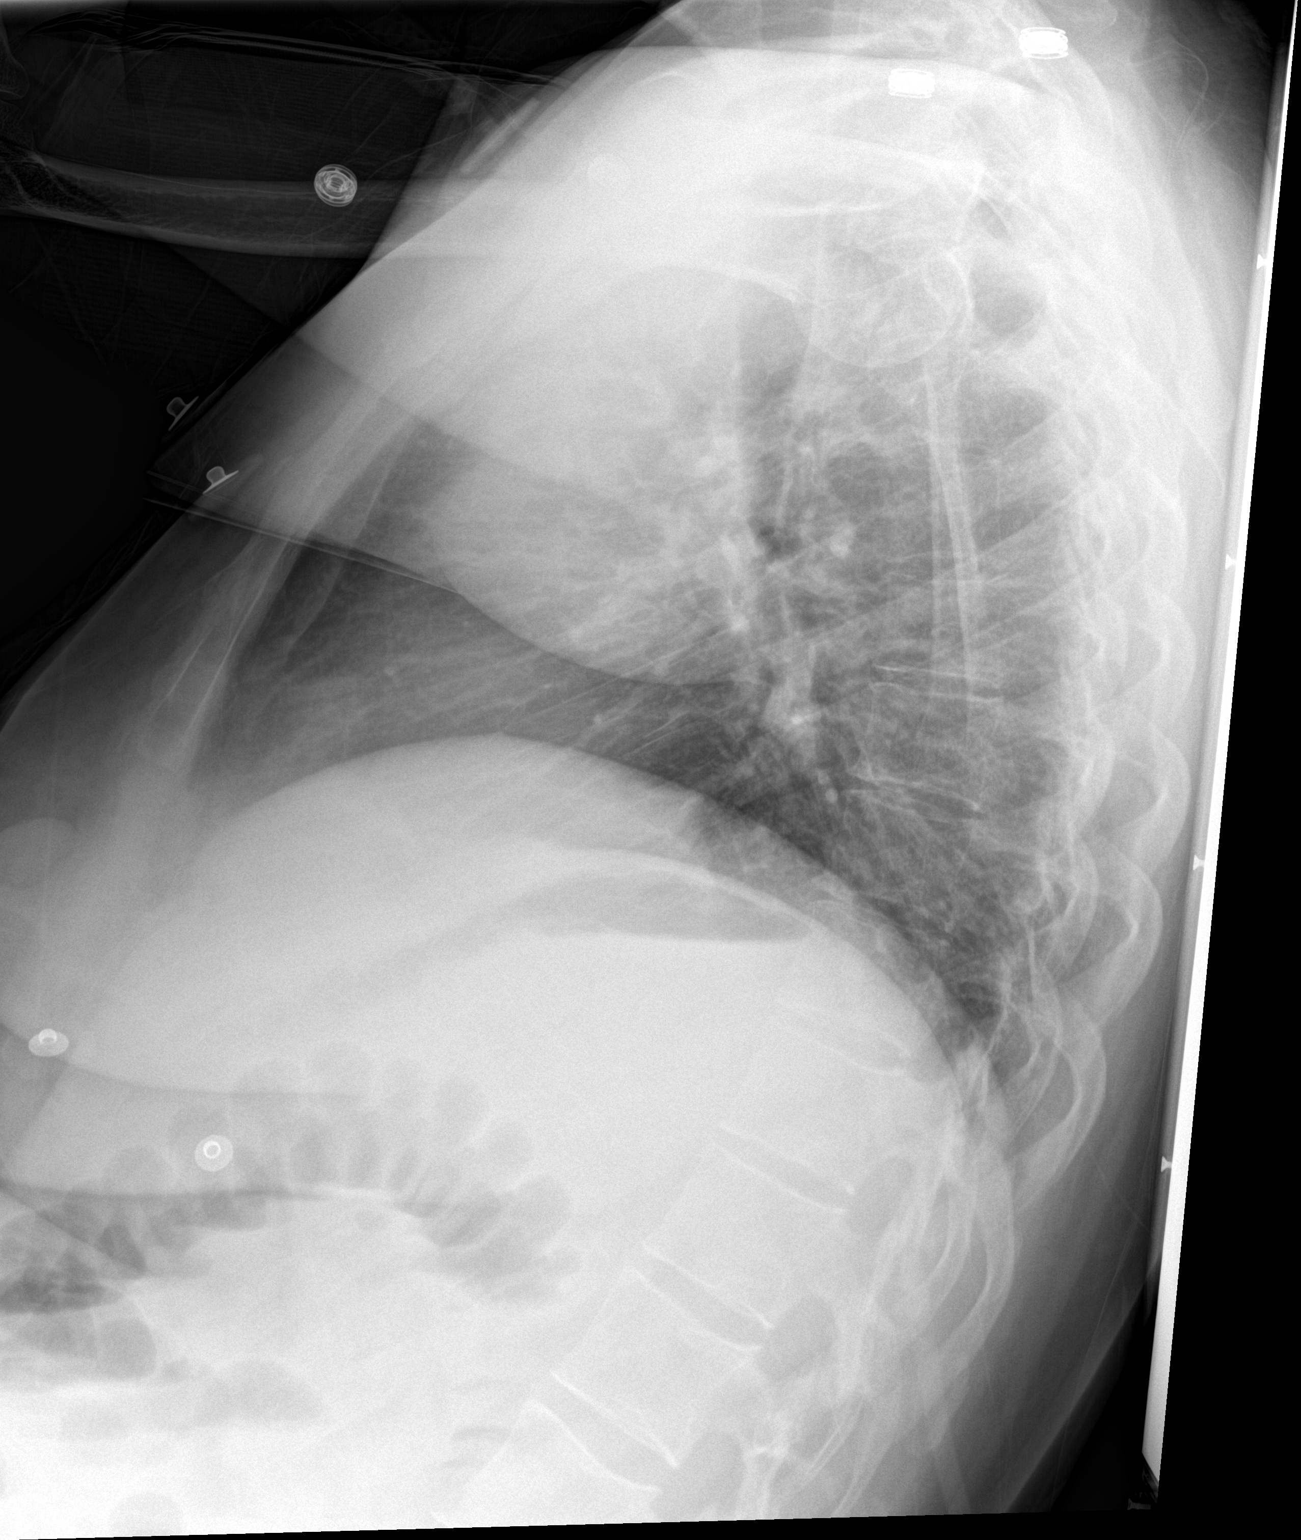

[chest ap]
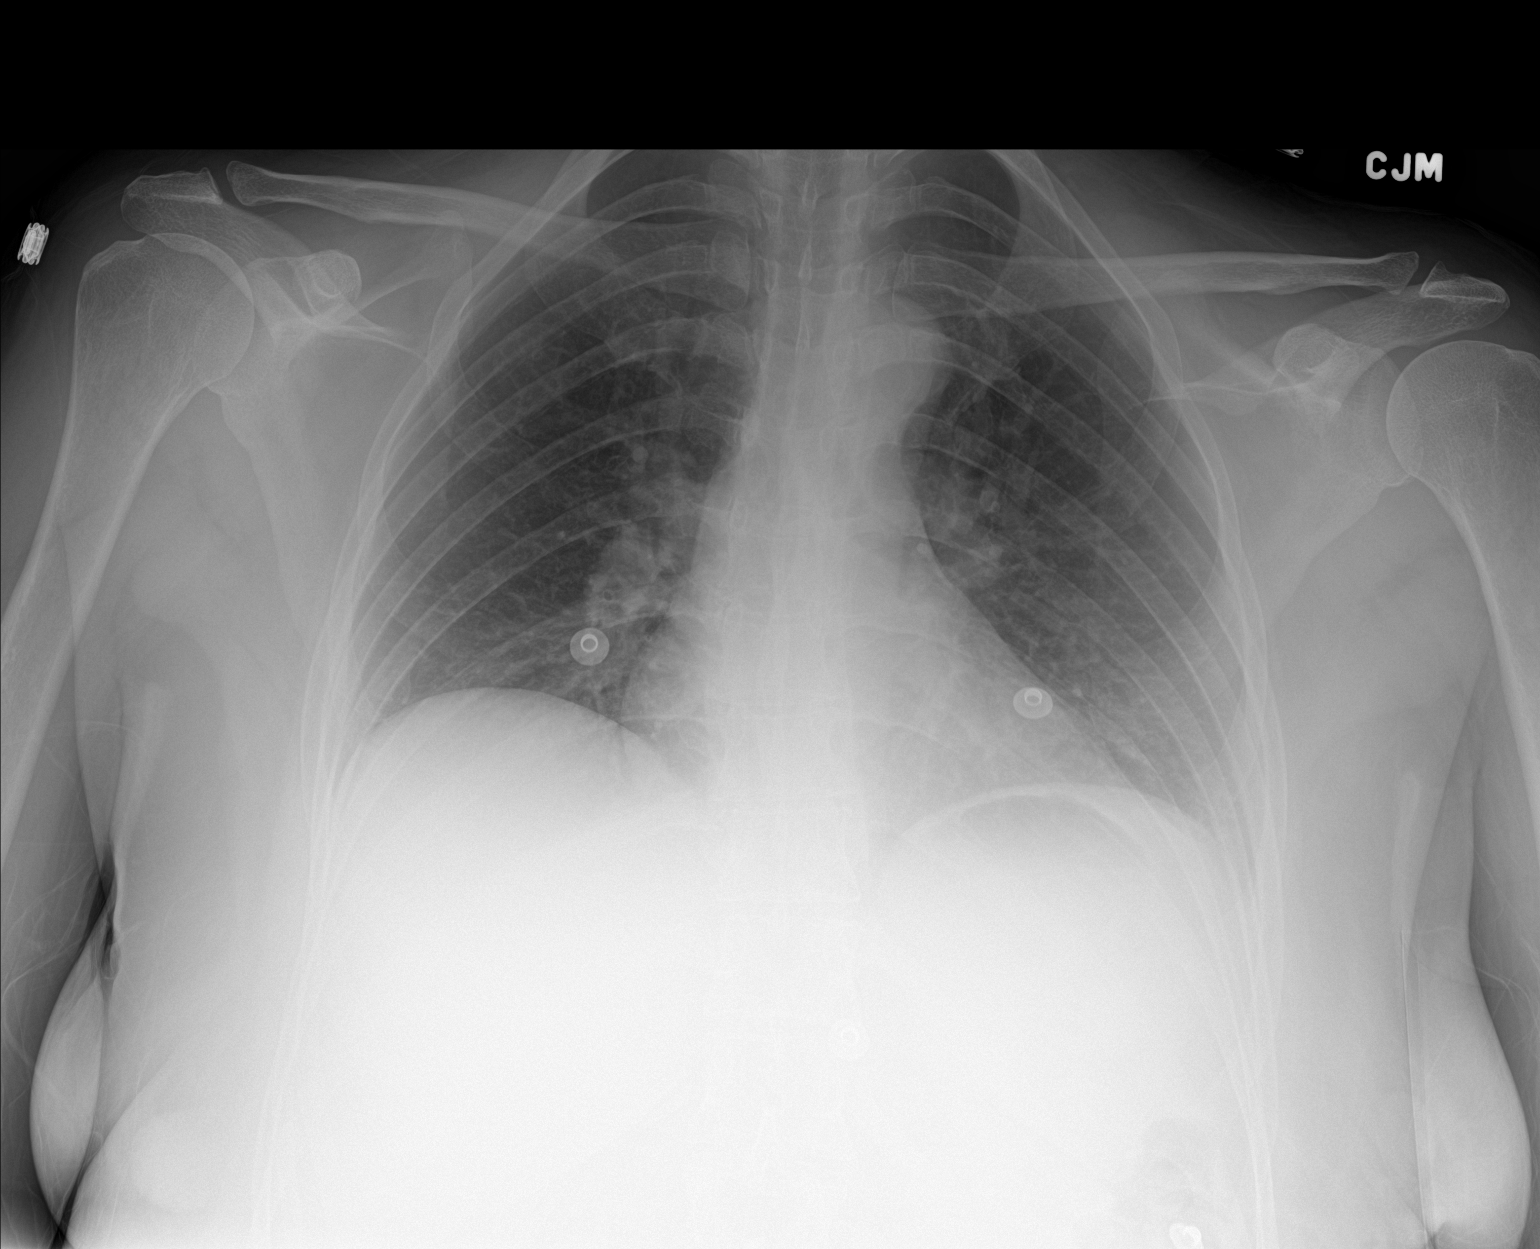

[2 of 2 positions shown; findings below may reference images not displayed]

FINDINGS: Cardiomediastinal silhouette is normal. The lungs are clear without
pleural effusions or focal consolidations. LEFT lung base strandy
densities. Trachea projects midline and there is no pneumothorax.
Soft tissue planes and included osseous structures are
non-suspicious.
IMPRESSION: LEFT lung base atelectasis.

## 2016-08-31 ENCOUNTER — Encounter: Payer: Self-pay | Admitting: Gynecology

## 2016-09-01 ENCOUNTER — Encounter: Payer: Self-pay | Admitting: Anesthesiology

## 2016-09-09 ENCOUNTER — Other Ambulatory Visit: Payer: BLUE CROSS/BLUE SHIELD

## 2016-09-16 ENCOUNTER — Other Ambulatory Visit: Payer: BLUE CROSS/BLUE SHIELD

## 2016-09-16 DIAGNOSIS — E559 Vitamin D deficiency, unspecified: Secondary | ICD-10-CM

## 2016-09-17 LAB — VITAMIN D 25 HYDROXY (VIT D DEFICIENCY, FRACTURES): Vit D, 25-Hydroxy: 24 ng/mL — ABNORMAL LOW (ref 30–100)

## 2016-09-22 ENCOUNTER — Other Ambulatory Visit: Payer: Self-pay | Admitting: Anesthesiology

## 2016-09-22 DIAGNOSIS — E559 Vitamin D deficiency, unspecified: Secondary | ICD-10-CM

## 2016-09-22 MED ORDER — VITAMIN D (ERGOCALCIFEROL) 1.25 MG (50000 UNIT) PO CAPS: 50000.0000 [IU] | ORAL_CAPSULE | ORAL | 0 refills | Status: DC

## 2016-09-23 ENCOUNTER — Encounter: Payer: Self-pay | Admitting: *Deleted

## 2016-10-27 ENCOUNTER — Ambulatory Visit (HOSPITAL_COMMUNITY)
Admission: RE | Admit: 2016-10-27 | Discharge: 2016-10-27 | Disposition: A | Discharge status: Home / Self Care | Payer: BLUE CROSS/BLUE SHIELD | Source: Ambulatory Visit | Attending: Emergency Medicine | Admitting: Emergency Medicine

## 2016-10-27 ENCOUNTER — Encounter: Payer: Self-pay | Admitting: Emergency Medicine

## 2016-10-27 ENCOUNTER — Encounter (HOSPITAL_COMMUNITY): Payer: Self-pay

## 2016-10-27 ENCOUNTER — Ambulatory Visit (INDEPENDENT_AMBULATORY_CARE_PROVIDER_SITE_OTHER): Payer: BLUE CROSS/BLUE SHIELD | Admitting: Emergency Medicine

## 2016-10-27 VITALS — BP 115/76 | HR 58 | Temp 98.4°F | Resp 16 | Ht 64.17 in | Wt 194.8 lb

## 2016-10-27 DIAGNOSIS — R918 Other nonspecific abnormal finding of lung field: Secondary | ICD-10-CM | POA: Insufficient documentation

## 2016-10-27 DIAGNOSIS — R079 Chest pain, unspecified: Secondary | ICD-10-CM

## 2016-10-27 DIAGNOSIS — Z86711 Personal history of pulmonary embolism: Secondary | ICD-10-CM | POA: Diagnosis not present

## 2016-10-27 DIAGNOSIS — J029 Acute pharyngitis, unspecified: Secondary | ICD-10-CM | POA: Diagnosis not present

## 2016-10-27 MED ORDER — AZITHROMYCIN 250 MG PO TABS: ORAL_TABLET | ORAL | 0 refills | Status: DC

## 2016-10-27 MED ORDER — IOPAMIDOL (ISOVUE-370) INJECTION 76%
INTRAVENOUS | Status: AC
  Administered 2016-10-27: 100 mL via INTRAVENOUS
  Filled 2016-10-27: qty 100

## 2016-10-27 NOTE — Progress Notes (Signed)
[facility] 46 y.o.   Chief Complaint  Patient presents with  . Chest Pain    Pressure with right arm radiation    HISTORY OF PRESENT ILLNESS: [name] complaining of chest pressure that happened early this am and persisted throughout the am; has h/o pulmonary embolism 2016; no longer anticoagulated. Thinks might be her nerves; also c/o left sided sore throat with some neck discomfort.  HPI   Prior to Admission medications   Medication Sig Start Date End Date Taking? Authorizing Provider  omeprazole (PRILOSEC) 20 MG capsule Take 1 capsule (20 mg total) by mouth daily. 10/16/15  Yes Wendie Agreste, MD  Vitamin D, Ergocalciferol, (DRISDOL) 50000 units CAPS capsule Take 1 capsule (50,000 Units total) by mouth every 7 (seven) days. 06/09/16  Yes Terrance Mass, MD    Allergies  Allergen Reactions  . Latex Itching and Rash    Itching with use of condoms    Patient Active Problem List   Diagnosis Date Noted  . Left sided chest pain 05/12/2016  . Intramural leiomyoma of uterus 02/29/2016  . Numbness and tingling 02/15/2016  . GERD (gastroesophageal reflux disease) 10/19/2015  . Acute upper back pain 07/10/2015  . Allergic rhinitis 06/10/2015  . Sciatic pain 11/24/2014  . Hx of pulmonary embolus during pregnancy 11/09/2014  . Hx of gestational diabetes mellitus, not currently pregnant     Past Medical History:  Diagnosis Date  . Abnormal Pap smear    patient states she had cancer that was removed from her cervix  . Asthma    when pregnant/ once 6 yrs ago only time asthma attack the dr said  . Cancer ([place])   . Clotting disorder ([place])   . Gestational diabetes   . Intramural leiomyoma of uterus 02/29/2016  . Pulmonary embolism ([place])   . Renal insufficiency   . Vitamin D deficiency   . Vitamin D deficiency     Past Surgical History:  Procedure Laterality Date  . CHOLECYSTECTOMY      Social History   Social History  . Marital status:  Married    Spouse name: [name]  . Number of children: [name]  . Years of education: [name]   Occupational History  . Not on file.   Social History Main Topics  . Smoking status: Never Smoker  . Smokeless tobacco: Never Used  . Alcohol use 0.0 oz/week     Comment: Occasionally.  . Drug use: No  . Sexual activity: Yes    Partners: Male    Birth control/ protection: Condom   Other Topics Concern  . Not on file   Social History Narrative   Married with four children. Came to [place] from [place] in 2001. Unemployed currently. Spanish speaking.    Family History  Problem Relation Age of Onset  . Diabetes Mother   . Hypertension Mother   . Hyperlipidemia Father   . Diabetes Father   . Hypertension Father   . Heart disease Sister   . Diabetes Brother   . Hyperlipidemia Brother   . Drug abuse Paternal Grandmother   . Kidney disease Other   . Birth defects Other        anal atresia/stenosis     Review of Systems  Constitutional: Negative.  Negative for chills and fever.  HENT: Positive for sore throat. Negative for sinus pain.   Eyes: Negative.  Negative for discharge and redness.  Respiratory: Negative.  Negative for cough, hemoptysis, shortness  of breath and wheezing.   Cardiovascular: Positive for chest pain. Negative for palpitations and leg swelling.  Gastrointestinal: Negative for abdominal pain, diarrhea, nausea and vomiting.  Genitourinary: Negative.   Musculoskeletal: Positive for neck pain.  Skin: Negative.  Negative for rash.  Neurological: Negative for dizziness and headaches.  Endo/Heme/Allergies: Negative.   All other systems reviewed and are negative.   Vitals:   10/27/16 0929  BP: 115/76  Pulse: (!) 58  Resp: 16  Temp: 98.4 F (36.9 C)    Physical Exam  Constitutional: She is oriented to person, place, and time. She appears well-developed and well-nourished.  HENT:  Head: Normocephalic and atraumatic.  Nose: Nose normal.  Mouth/Throat: Uvula is  midline. Posterior oropharyngeal erythema present. No oropharyngeal exudate or tonsillar abscesses.    Eyes: Pupils are equal, round, and reactive to light. Conjunctivae and EOM are normal.  Neck: Normal range of motion. Neck supple. No JVD present.  Cardiovascular: Normal rate, regular rhythm, normal heart sounds and intact distal pulses.   Pulmonary/Chest: Effort normal and breath sounds normal.  Abdominal: Soft. She exhibits no distension. There is no tenderness.  Musculoskeletal: Normal range of motion.  Lymphadenopathy:    She has no cervical adenopathy.  Neurological: She is alert and oriented to person, place, and time. No sensory deficit. She exhibits normal muscle tone.  Skin: Skin is warm and dry. Capillary refill takes less than 2 seconds. No rash noted.  Psychiatric: She has a normal mood and affect. Her behavior is normal.  Vitals reviewed. EKG: NSR; no acute ischemic changes.  Ct Angio Chest W/cm &/or Wo Cm  Result Date: 10/27/2016 CLINICAL DATA:  Mid anterior chest pain. EXAM: CT ANGIOGRAPHY CHEST WITH CONTRAST TECHNIQUE: Multidetector CT imaging of the chest was performed using the standard protocol during bolus administration of intravenous contrast. Multiplanar CT image reconstructions and MIPs were obtained to evaluate the vascular anatomy. CONTRAST:  70 mL Isovue 370 COMPARISON:  CT 05/12/2016 FINDINGS: Cardiovascular: No filling defects within the pulmonary arteries to suggest acute pulmonary embolism. No significant vascular findings. Normal heart size. No pericardial effusion. Mediastinum/Nodes: No axillary supraclavicular lymphadenopathy. No mediastinal hilar adenopathy. No pericardial fluid. Esophagus normal Lungs/Pleura: Several small subpleural nodules in the RIGHT middle lobe. For exam 5 mm (image 74, series 8). No change from comparison CT. Upper Abdomen: Limited view of the liver, kidneys, pancreas are unremarkable. Normal adrenal glands. Musculoskeletal: No  aggressive osseous lesion. Review of the MIP images confirms the above findings. IMPRESSION: 1. No evidence acute pulmonary embolism. 2. Small RIGHT lower lobe subpleural pulmonary nodules are unchanged likely benign. Electronically Signed   By: Suzy Bouchard M.D.   On: 10/27/2016 12:45   337 pm: results d/w patient on the phone. ASSESSMENT & PLAN: Britny was seen today for chest pain.  Diagnoses and all orders for this visit:  Chest pain, unspecified type -     CBC with Differential/Platelet -     Comprehensive metabolic panel -     EKG 40-NUUV -     CT Angio Chest W/Cm &/Or Wo Cm; Future  History of pulmonary embolus (PE) -     CT Angio Chest W/Cm &/Or Wo Cm; Future  Sore throat -     azithromycin (ZITHROMAX) 250 MG tablet; Sig as indicated   Patient Instructions   Tiene una cita para el CT scan del pecho en [facility] a las 11:15 am hoy. No coma nada antes de [employer].  We recommend that you schedule  a mammogram for breast cancer screening. Typically, you do not need a referral to do this. Please contact a local imaging center to schedule your mammogram.  [facility] - [phone]  *ask for the Radiology Department [facility] ([place]) - [phone] or [phone]  [facility] - [phone] [place] [phone] [facility] - [phone]  *ask for the [facility] - [phone]  *ask for the Radiology Department [facility] - [phone]  *ask for the [place] - [phone]    IF you received an x-ray today, you will receive an invoice from [facility]. Please contact [facility] at [phone] with questions or concerns regarding your invoice.   IF you received labwork today, you will receive an invoice from [facility]. Please contact [facility] at [phone] with  questions or concerns regarding your invoice.   Our billing staff will not be able to assist you with questions regarding bills from these companies.  You will be contacted with the lab results as soon as they are available. The fastest way to get your results is to activate your My Chart account. Instructions are located on the last page of this paperwork. If you have not heard from [place] regarding the results in 2 weeks, please contact this office.     Dolor de pecho inespecfico (Nonspecific Chest Pain) Suele ser difcil encontrar la causa del dolor de [place]. Siempre existe una posibilidad de que el dolor est relacionado con algo grave, como un infarto de miocardio o un cogulo sanguneo en los pulmones. Hay muchas enfermedades que no son potencialmente mortales que pueden causar dolor de [place]. Es importante que concurra a las visitas de control con el mdico. CUIDADOS EN EL HOGAR  Si le recetaron antibiticos, asegrese de terminarlos, incluso si comienza a Sports administrator.  Evite las [employer] causen dolor de [place].  No consuma ningn producto que contenga tabaco, lo que incluye cigarrillos, tabaco de Higher education careers adviser o Psychologist, sport and exercise. Si necesita ayuda para dejar de fumar, consulte al mdico.  No beba alcohol.  Tome los medicamentos solamente como se lo haya indicado el mdico.  Concurra a todas las visitas de control como se lo haya indicado el mdico. Esto es importante. Esto incluye otros estudios si el dolor de pecho no desaparece.  El mdico puede indicarle que mantenga la cabeza levantada (elevada) mientras duerme.  Haga cambios en su estilo de vida segn las indicaciones del mdico. Estos pueden incluir lo siguiente: ? Practicar actividad fsica con regularidad. Pdale al mdico que le sugiera algunas actividades que sean seguras para usted. ? Consumir una dieta cardiosaludable. El mdico o un especialista en alimentacin (nutricionista) pueden ayudarlo a que haga  elecciones saludables. ? Mantener un peso saludable. ? Controlar la diabetes, si es necesario. ? Reducir las situaciones de estrs.  SOLICITE AYUDA SI:  El dolor de pecho no desaparece, incluso despus del tratamiento.  Tiene una erupcin cutnea con ampollas en el pecho.  Tiene fiebre.  SOLICITE AYUDA DE INMEDIATO SI:  El dolor en el pecho es ms intenso.  La tos empeora, o expectora sangre.  Siente un dolor intenso en el vientre (abdomen).  Se siente muy dbil.  Pierde el conocimiento (se desmaya).  Tiene escalofros.  Tiene una molestia repentina e inexplicable en el pecho.  Tiene molestias repentinas e [employer], la espalda, el cuello o la [place].  Le falta el  aire en cualquier momento.  Comienza a sudar de [place] repentina o la piel se le humedece.  Siente nuseas.  Vomita.  Se siente repentinamente mareado o se desmaya.  Siente que el corazn comienza a latir rpidamente o que se saltea latidos. Estos sntomas pueden Sales executive. No espere hasta que los sntomas desaparezcan. Solicite atencin mdica de inmediato. Comunquese con el servicio de emergencias de su localidad (911 en los [place]). No conduzca por sus propios medios [employer]. Esta informacin no tiene Marine scientist el consejo del mdico. Asegrese de hacerle al mdico cualquier pregunta que tenga. Document Released: 07/01/2008 Document Revised: 04/25/2014 Document Reviewed: 10/12/2015 Elsevier Interactive Patient Education  2017 Elsevier Inc.      Agustina Caroli, MD [facility]

## 2016-10-27 NOTE — Patient Instructions (Addendum)
Tiene una cita para el CT scan del pecho en [facility] a las 11:15 am hoy. No coma nada antes de [employer].  We recommend that you schedule a mammogram for breast cancer screening. Typically, you do not need a referral to do this. Please contact a local imaging center to schedule your mammogram.  [facility] - [phone]  *ask for the Radiology Department [facility] ([place]) - [phone] or [phone]  [facility] - [phone] [place] [phone] [facility] - [phone]  *ask for the [facility] - [phone]  *ask for the Radiology Department [facility] - [phone]  *ask for the [place] - [phone]    IF you received an x-ray today, you will receive an invoice from [facility]. Please contact [facility] Radiology at [phone] with questions or concerns regarding your invoice.   IF you received labwork today, you will receive an invoice from [place]. Please contact [facility] at [phone] with questions or concerns regarding your invoice.   Our billing staff will not be able to assist you with questions regarding bills from these companies.  You will be contacted with the lab results as soon as they are available. The fastest way to get your results is to activate your My Chart account. Instructions are located on the last page of this paperwork. If you have not heard from [place] regarding the results in 2 weeks, please contact this office.     Dolor de pecho inespecfico (Nonspecific Chest Pain) Suele ser difcil encontrar la causa del dolor de [place]. Siempre existe una posibilidad de que el dolor est relacionado con algo grave, como un infarto de miocardio o un cogulo sanguneo en los pulmones. Hay muchas enfermedades que no son potencialmente mortales que pueden causar dolor de  [place]. Es importante que concurra a las visitas de control con el mdico. CUIDADOS EN EL HOGAR  Si le recetaron antibiticos, asegrese de terminarlos, incluso si comienza a Sports administrator.  Evite las [employer] causen dolor de [place].  No consuma ningn producto que contenga tabaco, lo que incluye cigarrillos, tabaco de Higher education careers adviser o Psychologist, sport and exercise. Si necesita ayuda para dejar de fumar, consulte al mdico.  No beba alcohol.  Tome los medicamentos solamente como se lo haya indicado el mdico.  Concurra a todas las visitas de control como se lo haya indicado el mdico. Esto es importante. Esto incluye otros estudios si el dolor de pecho no desaparece.  El mdico puede indicarle que mantenga la cabeza levantada (elevada) mientras duerme.  Haga cambios en su estilo de vida segn las indicaciones del mdico. Estos pueden incluir lo siguiente: ? Practicar actividad fsica con regularidad. Pdale al mdico que le sugiera algunas actividades que sean seguras para usted. ? Consumir una dieta cardiosaludable. El mdico o un especialista en alimentacin (nutricionista) pueden ayudarlo a que haga elecciones saludables. ? Mantener un peso saludable. ? Controlar la diabetes, si es necesario. ? Reducir las situaciones de estrs.  SOLICITE AYUDA SI:  El dolor de pecho no desaparece, incluso despus del tratamiento.  Tiene una erupcin cutnea con ampollas en el pecho.  Tiene fiebre.  SOLICITE AYUDA DE INMEDIATO SI:  El dolor en el pecho es ms intenso.  La tos empeora, o expectora sangre.  Siente un dolor intenso en el vientre (abdomen).  Se siente muy dbil.  Pierde el conocimiento (se desmaya).  Tiene escalofros.  Tiene una molestia repentina e inexplicable en el pecho.  Tiene molestias repentinas e [employer], la espalda, el cuello o la [place].  Le falta el aire en cualquier momento.  Comienza a sudar de [place] repentina o la piel se le  humedece.  Siente nuseas.  Vomita.  Se siente repentinamente mareado o se desmaya.  Siente que el corazn comienza a latir rpidamente o que se saltea latidos. Estos sntomas pueden Sales executive. No espere hasta que los sntomas desaparezcan. Solicite atencin mdica de inmediato. Comunquese con el servicio de emergencias de su localidad (911 en los [place]). No conduzca por sus propios medios [employer]. Esta informacin no tiene Marine scientist el consejo del mdico. Asegrese de hacerle al mdico cualquier pregunta que tenga. Document Released: 07/01/2008 Document Revised: 04/25/2014 Document Reviewed: 10/12/2015 Elsevier Interactive Patient Education  2017 [employer].

## 2016-10-28 LAB — CBC WITH DIFFERENTIAL/PLATELET
Basophils Absolute: 0 10*3/uL (ref 0.0–0.2)
Basos: 1 %
EOS (ABSOLUTE): 0.2 10*3/uL (ref 0.0–0.4)
Eos: 2 %
Hematocrit: 41 % (ref 34.0–46.6)
Hemoglobin: 13.5 g/dL (ref 11.1–15.9)
Immature Grans (Abs): 0 10*3/uL (ref 0.0–0.1)
Immature Granulocytes: 0 %
Lymphocytes Absolute: 2.5 10*3/uL (ref 0.7–3.1)
Lymphs: 36 %
MCH: 30.1 pg (ref 26.6–33.0)
MCHC: 32.9 g/dL (ref 31.5–35.7)
MCV: 92 fL (ref 79–97)
Monocytes Absolute: 0.4 10*3/uL (ref 0.1–0.9)
Monocytes: 6 %
Neutrophils Absolute: 3.9 10*3/uL (ref 1.4–7.0)
Neutrophils: 55 %
Platelets: 307 10*3/uL (ref 150–379)
RBC: 4.48 x10E6/uL (ref 3.77–5.28)
RDW: 14.1 % (ref 12.3–15.4)
WBC: 7 10*3/uL (ref 3.4–10.8)

## 2016-10-28 LAB — COMPREHENSIVE METABOLIC PANEL
ALT: 13 IU/L (ref 0–32)
AST: 18 IU/L (ref 0–40)
Albumin/Globulin Ratio: 1.4 (ref 1.2–2.2)
Albumin: 4.2 g/dL (ref 3.5–5.5)
Alkaline Phosphatase: 91 IU/L (ref 39–117)
BUN/Creatinine Ratio: 22 (ref 9–23)
BUN: 12 mg/dL (ref 6–24)
Bilirubin Total: 0.4 mg/dL (ref 0.0–1.2)
CO2: 20 mmol/L (ref 20–29)
Calcium: 8.9 mg/dL (ref 8.7–10.2)
Chloride: 103 mmol/L (ref 96–106)
Creatinine, Ser: 0.54 mg/dL — ABNORMAL LOW (ref 0.57–1.00)
GFR calc Af Amer: 132 mL/min/{1.73_m2} (ref >59)
GFR calc non Af Amer: 114 mL/min/{1.73_m2} (ref >59)
Globulin, Total: 3 g/dL (ref 1.5–4.5)
Glucose: 87 mg/dL (ref 65–99)
Potassium: 3.9 mmol/L (ref 3.5–5.2)
Sodium: 139 mmol/L (ref 134–144)
Total Protein: 7.2 g/dL (ref 6.0–8.5)

## 2016-11-14 ENCOUNTER — Encounter: Payer: Self-pay | Admitting: Physician Assistant

## 2016-11-14 ENCOUNTER — Ambulatory Visit (INDEPENDENT_AMBULATORY_CARE_PROVIDER_SITE_OTHER): Payer: BLUE CROSS/BLUE SHIELD | Admitting: Physician Assistant

## 2016-11-14 ENCOUNTER — Ambulatory Visit (INDEPENDENT_AMBULATORY_CARE_PROVIDER_SITE_OTHER): Payer: BLUE CROSS/BLUE SHIELD

## 2016-11-14 VITALS — BP 106/72 | HR 64 | Temp 97.9°F | Resp 16 | Ht 64.1 in | Wt 196.6 lb

## 2016-11-14 DIAGNOSIS — M545 Low back pain: Secondary | ICD-10-CM

## 2016-11-14 DIAGNOSIS — R8299 Other abnormal findings in urine: Secondary | ICD-10-CM | POA: Diagnosis not present

## 2016-11-14 DIAGNOSIS — R82998 Other abnormal findings in urine: Secondary | ICD-10-CM

## 2016-11-14 LAB — POCT URINALYSIS DIPSTICK
Bilirubin, UA: NEGATIVE
Glucose, UA: NEGATIVE
Ketones, UA: NEGATIVE
Nitrite, UA: NEGATIVE
Protein, UA: NEGATIVE
Spec Grav, UA: 1.01 (ref 1.010–1.025)
Urobilinogen, UA: 0.2 E.U./dL
pH, UA: 6 (ref 5.0–8.0)

## 2016-11-14 LAB — POCT URINE PREGNANCY: Preg Test, Ur: NEGATIVE

## 2016-11-14 MED ORDER — CYCLOBENZAPRINE HCL 10 MG PO TABS: 5.0000 mg | ORAL_TABLET | Freq: Three times a day (TID) | ORAL | 0 refills | Status: DC | PRN

## 2016-11-14 MED ORDER — MELOXICAM 15 MG PO TABS: 7.5000 mg | ORAL_TABLET | Freq: Every day | ORAL | 0 refills | Status: AC

## 2016-11-14 MED ORDER — NITROFURANTOIN MONOHYD MACRO 100 MG PO CAPS: 100.0000 mg | ORAL_CAPSULE | Freq: Two times a day (BID) | ORAL | 0 refills | Status: AC

## 2016-11-14 NOTE — Progress Notes (Addendum)
11/14/2016 10:30 AM   DOB: Jun 10, 1970 / MRN: [id number]  SUBJECTIVE:  Kim [name] is a 46 y.o. female presenting for lower back pain that has been present now for four days.  Feels that she is getting worse.  Pain is made worse by bending over. She has not had a period in about a year. She has tried tylenol with some relief.     She is allergic to latex.   She  has a past medical history of Abnormal Pap smear; Asthma; Cancer ([place]); Clotting disorder ([place]); Gestational diabetes; Intramural leiomyoma of uterus (02/29/2016); Pulmonary embolism ([place]); Renal insufficiency; Vitamin D deficiency; and Vitamin D deficiency.    She  reports that she has never smoked. She has never used smokeless tobacco. She reports that she drinks alcohol. She reports that she does not use drugs. She  reports that she currently engages in sexual activity and has had female partners. She reports using the following method of birth control/protection: Condom. The patient  has a past surgical history that includes Cholecystectomy.  Her family history includes Birth defects in her other; Diabetes in her brother, father, and mother; Drug abuse in her paternal grandmother; Heart disease in her sister; Hyperlipidemia in her brother and father; Hypertension in her father and mother; Kidney disease in her other.  Review of Systems  Constitutional: Negative for fever.  Genitourinary: Positive for dysuria. Negative for flank pain, frequency, hematuria and urgency.  Neurological: Negative for dizziness.    The problem list and medications were reviewed and updated by myself where necessary and exist elsewhere in the encounter.   OBJECTIVE:  BP 106/72 (BP Location: Right Arm, Patient Position: Sitting, Cuff Size: Large)   Pulse 64   Temp 97.9 F (36.6 C) (Oral)   Resp 16   Ht 5' 4.1" (1.628 m)   Wt 196 lb 9.6 oz (89.2 kg)   LMP 10/02/2016   SpO2 99%   Breastfeeding? No   BMI 33.64 kg/m   Physical Exam    Constitutional: She is oriented to person, place, and time. She appears well-developed and well-nourished.  Cardiovascular: Normal rate and regular rhythm.   Pulmonary/Chest: Effort normal and breath sounds normal.  Abdominal: Soft. Bowel sounds are normal. She exhibits no distension and no mass. There is no tenderness. There is no rebound and no guarding.  Musculoskeletal: Normal range of motion.  Neurological: She is alert and oriented to person, place, and time. She has normal reflexes. She displays normal reflexes. No cranial nerve deficit. She exhibits normal muscle tone. Coordination normal.  Skin: Skin is warm and dry.    Results for orders placed or performed in visit on 11/14/16 (from the past 72 hour(s))  POCT urine pregnancy     Status: Normal   Collection Time: 11/14/16  9:36 AM  Result Value Ref Range   Preg Test, Ur Negative Negative  POCT urinalysis dipstick     Status: Abnormal   Collection Time: 11/14/16  9:37 AM  Result Value Ref Range   Color, UA yellow    Clarity, UA clear    Glucose, UA negative    Bilirubin, UA negative    Ketones, UA negative    Spec Grav, UA 1.010 1.010 - 1.025   Blood, UA trace    pH, UA 6.0 5.0 - 8.0   Protein, UA negative    Urobilinogen, UA 0.2 0.2 or 1.0 E.U./dL   Nitrite, UA negative    Leukocytes, UA Moderate (2+) (A)  Negative    Dg Lumbar Spine 2-3 Views  Result Date: 11/14/2016 CLINICAL DATA:  Low back pain. EXAM: LUMBAR SPINE - 2-3 VIEW COMPARISON:  None. FINDINGS: There is no evidence of lumbar spine fracture. Alignment is normal. Intervertebral disc spaces are maintained. IMPRESSION: Normal lumbar spine. Electronically Signed   By: Marijo Conception, M.D.   On: 11/14/2016 10:17    ASSESSMENT AND PLAN:  Kim [name] was seen today for back pain.  Diagnoses and all orders for this visit:  Low back pain, unspecified back pain laterality, unspecified chronicity, with sciatica presence unspecified -     POCT urinalysis dipstick -      POCT urine pregnancy -     meloxicam (MOBIC) 15 MG tablet; Take 0.5-1 tablets (7.5-15 mg total) by mouth daily. Take with food. Do not take Ibuprofen, Goody's, or Aleve while taking this medication. -     cyclobenzaprine (FLEXERIL) 10 MG tablet; Take 0.5-1 tablets (5-10 mg total) by mouth 3 (three) times daily as needed. -     DG Lumbar Spine 2-3 Views; Future  Urine leukocytes increased -     Urinalysis, microscopic only -     Urine Culture -     nitrofurantoin, macrocrystal-monohydrate, (MACROBID) 100 MG capsule; Take 1 capsule (100 mg total) by mouth 2 (two) times daily.    The patient is advised to call or return to clinic if she does not see an improvement in symptoms, or to seek the care of the closest emergency department if she worsens with the above plan.   Philis Fendt, MHS, PA-C [facility] 11/14/2016 10:30 AM

## 2016-11-14 NOTE — Patient Instructions (Signed)
     IF you received an x-ray today, you will receive an invoice from [facility]. Please contact [facility] at [phone] with questions or concerns regarding your invoice.   IF you received labwork today, you will receive an invoice from [facility]. Please contact [facility] at [phone] with questions or concerns regarding your invoice.   Our billing staff will not be able to assist you with questions regarding bills from these companies.  You will be contacted with the lab results as soon as they are available. The fastest way to get your results is to activate your My Chart account. Instructions are located on the last page of this paperwork. If you have not heard from us regarding the results in 2 weeks, please contact this office.     

## 2016-11-15 LAB — URINE CULTURE

## 2016-11-15 LAB — URINALYSIS, MICROSCOPIC ONLY: Casts: NONE SEEN /lpf

## 2016-11-23 ENCOUNTER — Telehealth: Payer: Self-pay

## 2016-11-23 NOTE — Telephone Encounter (Signed)
-----   Message from Tereasa Coop, PA-C sent at 11/15/2016 10:47 PM EDT ----- Please advise that she stop the antibiotic. Culture is negative. Continue meloxicam and flexeril.

## 2016-11-23 NOTE — Telephone Encounter (Signed)
Spoke with patient husband, released information to him. Patient husband verbalized understanding./ S.Kaydin Labo,CMA

## 2016-12-11 ENCOUNTER — Other Ambulatory Visit: Payer: Self-pay | Admitting: Physician Assistant

## 2016-12-11 DIAGNOSIS — M545 Low back pain: Secondary | ICD-10-CM

## 2016-12-27 ENCOUNTER — Other Ambulatory Visit: Payer: Self-pay | Admitting: *Deleted

## 2016-12-27 NOTE — Telephone Encounter (Signed)
Refill for vit d 50,000units denied. Needs Vit D level checked before refill. KW CMA

## 2017-01-02 ENCOUNTER — Other Ambulatory Visit: Payer: Self-pay | Admitting: Physician Assistant

## 2017-01-02 DIAGNOSIS — M545 Low back pain: Secondary | ICD-10-CM

## 2017-01-04 ENCOUNTER — Other Ambulatory Visit: Payer: Self-pay

## 2017-02-03 ENCOUNTER — Ambulatory Visit (INDEPENDENT_AMBULATORY_CARE_PROVIDER_SITE_OTHER): Payer: BLUE CROSS/BLUE SHIELD | Admitting: Obstetrics & Gynecology

## 2017-02-03 ENCOUNTER — Encounter: Payer: Self-pay | Admitting: Obstetrics & Gynecology

## 2017-02-03 VITALS — Ht 64.5 in | Wt 195.0 lb

## 2017-02-03 DIAGNOSIS — Z23 Encounter for immunization: Secondary | ICD-10-CM

## 2017-02-03 DIAGNOSIS — R300 Dysuria: Secondary | ICD-10-CM

## 2017-02-03 DIAGNOSIS — Z01419 Encounter for gynecological examination (general) (routine) without abnormal findings: Secondary | ICD-10-CM | POA: Diagnosis not present

## 2017-02-03 DIAGNOSIS — R7989 Other specified abnormal findings of blood chemistry: Secondary | ICD-10-CM | POA: Diagnosis not present

## 2017-02-03 DIAGNOSIS — Z1151 Encounter for screening for human papillomavirus (HPV): Secondary | ICD-10-CM

## 2017-02-03 DIAGNOSIS — N914 Secondary oligomenorrhea: Secondary | ICD-10-CM

## 2017-02-03 MED ORDER — MEDROXYPROGESTERONE ACETATE 5 MG PO TABS: 5.0000 mg | ORAL_TABLET | Freq: Every day | ORAL | 4 refills | Status: DC

## 2017-02-03 MED ORDER — VITAMIN D (ERGOCALCIFEROL) 1.25 MG (50000 UNIT) PO CAPS: 50000.0000 [IU] | ORAL_CAPSULE | ORAL | 0 refills | Status: DC

## 2017-02-03 MED ORDER — SULFAMETHOXAZOLE-TRIMETHOPRIM 800-160 MG PO TABS: 1.0000 | ORAL_TABLET | Freq: Two times a day (BID) | ORAL | 0 refills | Status: AC

## 2017-02-03 NOTE — Progress Notes (Signed)
[place] 1971-04-02 [id number]   History:    46 y.o. G71P5 Married  RP:  Established patient presenting for annual gyn exam  HPI:  Menses q2-3 months.  Using condoms and withdrawal.  No pelvic pain.  H/O Laser conization of cervix about 20 yrs ago.  Last pap ASCUS/HPV HR neg 04/2016.  Breasts wnl.  Mictions/BMs wnl.  Past medical history,surgical history, family history and social history were all reviewed and documented in the EPIC chart.  Gynecologic History Patient's last menstrual period was 12/04/2016. Contraception: condoms Last Pap: 04/2016. Results were: Neg/HPV HR neg Last mammogram: 05/2016. Results were: normal  Obstetric History OB History  Gravida Para Term Preterm AB Living  5 5 5     4   SAB TAB Ectopic Multiple Live Births        0 4    # Outcome Date GA Lbr Len/2nd Weight Sex Delivery Anes PTL Lv  5 Term 11/04/14 [redacted]w[redacted]d  7 lb 8.5 oz (3.416 kg) F Vag-Spont EPI  LIV  4 Term 03/18/08 [redacted]w[redacted]d    Vag-Spont  N LIV  3 Term 11/07/00 [redacted]w[redacted]d    Vag-Spont  N LIV  2 Term 07/09/90 [redacted]w[redacted]d    Vag-Spont  N LIV  1 Term 08/24/89 [redacted]w[redacted]d    Vag-Spont          ROS: A ROS was performed and pertinent positives and negatives are included in the history.  GENERAL: No fevers or chills. HEENT: No change in vision, no earache, sore throat or sinus congestion. NECK: No pain or stiffness. CARDIOVASCULAR: No chest pain or pressure. No palpitations. PULMONARY: No shortness of breath, cough or wheeze. GASTROINTESTINAL: No abdominal pain, nausea, vomiting or diarrhea, melena or bright red blood per rectum. GENITOURINARY: No urinary frequency, urgency, hesitancy or dysuria. MUSCULOSKELETAL: No joint or muscle pain, no back pain, no recent trauma. DERMATOLOGIC: No rash, no itching, no lesions. ENDOCRINE: No polyuria, polydipsia, no heat or cold intolerance. No recent change in weight. HEMATOLOGICAL: No anemia or easy bruising or bleeding. NEUROLOGIC: No headache, seizures, numbness, tingling  or weakness. PSYCHIATRIC: No depression, no loss of interest in normal activity or change in sleep pattern.     Exam:   Ht 5' 4.5" (1.638 m)   Wt 195 lb (88.5 kg)   LMP 12/04/2016   BMI 32.95 kg/m   Body mass index is 32.95 kg/m.  General appearance : Well developed well nourished female. No acute distress HEENT: Eyes: no retinal hemorrhage or exudates,  Neck supple, trachea midline, no carotid bruits, no thyroidmegaly Lungs: Clear to auscultation, no rhonchi or wheezes, or rib retractions  Heart: Regular rate and rhythm, no murmurs or gallops Breast:Examined in sitting and supine position were symmetrical in appearance, no palpable masses or tenderness,  no skin retraction, no nipple inversion, no nipple discharge, no skin discoloration, no axillary or supraclavicular lymphadenopathy Abdomen: no palpable masses or tenderness, no rebound or guarding Extremities: no edema or skin discoloration or tenderness  Pelvic: Vulva normal  Bartholin, Urethra, Skene Glands: Within normal limits             Vagina: No gross lesions or discharge  Cervix: No gross lesions or discharge.  Pap/HPV HR done.  Uterus  AV, normal size, shape and consistency, non-tender and mobile  Adnexa  Without masses or tenderness  Anus and perineum  normal   Labs in 06/2016: FSH 4.4 TSH 1.75 Prolactin 4.0  Vit D 24 09/2016  UPT neg 10/2016  Assessment/Plan:  46 y.o. female for annual exam   1. Encounter for routine gynecological examination with Papanicolaou smear of cervix Normal gyn exam.  Pap/HPV HR done.  Breasts wnl.  - PAP,TP IMGw/HPV RNA,rflx [id number]/39  2. Secondary oligomenorrhea Probable Oligo-Ovulation.  Recent work-up 06/2016 with TSH normal, Prl normal, TSH 4.4.  Using condoms for contraception.  Declines BCPs/Progestin IUD/Nexplanon/DepoProvera which could regulated cycle/Protect Endometrium.  Agrees with Cyclic Provera 5 mg daily x 10 days every 3 months if no spontaneous cycle.   Usage/risks/benefits reviewed with patient.    3. Dysuria U/A Cloudy, Moderate Bacteria, WBC pos, Blood pos.  Nit neg.  Treat with Bactrim 1 tab twice a day x 3 days.  Pending Urine Culture. - Urinalysis with Culture Reflex  4. Low vitamin D level Vit D at 24 in 09/2016.  Will restart on Vit D supplements 50000 U every week x 12 weeks and recheck Vit D levels in 3 months. - Vitamin D 1,25 dihydroxy; Future  5. Flu vaccine need Given. - Flu Vaccine QUAD 36+ mos IM (Fluarix, Quad PF)  6. Special screening examination for human papillomavirus (HPV)  - PAP,TP IMGw/HPV RNA,rflx [id number]/86  Counseling on above issues >50% x 15 minutes  Princess Bruins MD, 9:48 AM 02/03/2017

## 2017-02-04 NOTE — Patient Instructions (Signed)
1. Encounter for routine gynecological examination with Papanicolaou smear of cervix Normal gyn exam.  Pap/HPV HR done.  Breasts wnl.  - PAP,TP IMGw/HPV RNA,rflx [id number]  2. Secondary oligomenorrhea Probable Oligo-Ovulation.  Recent work-up 06/2016 with TSH normal, Prl normal, TSH 4.4.  Using condoms for contraception.  Declines BCPs/Progestin IUD/Nexplanon/DepoProvera which could regulated cycle/Protect Endometrium.  Agrees with Cyclic Provera 5 mg daily x 10 days every 3 months if no spontaneous cycle.  Usage/risks/benefits reviewed with patient.    3. Dysuria U/A Cloudy, Moderate Bacteria, WBC pos, Blood pos.  Nit neg.  Treat with Bactrim 1 tab twice a day x 3 days.  Pending Urine Culture. - Urinalysis with Culture Reflex  4. Low vitamin D level Vit D at 24 in 09/2016.  Will restart on Vit D supplements 50000 U every week x 12 weeks and recheck Vit D levels in 3 months. - Vitamin D 1,25 dihydroxy; Future  5. Flu vaccine need Given. - Flu Vaccine QUAD 36+ mos IM (Fluarix, Quad PF)  6. Special screening examination for human papillomavirus (HPV)  - PAP,TP IMGw/HPV RNA,rflx [id number]/63  Kim [name], fue un placer encontrarla hoy!  Voy a informarla de Financial trader.  [place] (Health Maintenance, Female) Un estilo de vida saludable y los cuidados preventivos pueden favorecer considerablemente a la salud y Musician. Pregunte a su mdico cul es el cronograma de exmenes peridicos apropiado para usted. Esta es una buena oportunidad para consultarlo sobre cmo prevenir enfermedades y [place] sano. Adems de los controles, hay muchas otras cosas que puede hacer usted mismo. Los expertos han realizado numerosas investigaciones [employer] cambios en el estilo de vida y las medidas de prevencin que, [place], lo ayudarn a mantenerse sano. Solicite a su mdico ms informacin. EL PESO Y LA DIETA Consuma una dieta saludable.  Asegrese  de [employer] verduras, frutas, productos lcteos de bajo contenido de [place] y Advertising account planner.  No consuma muchos alimentos de alto contenido de grasas slidas, azcares agregados o sal.  Realice actividad fsica con regularidad. Esta es una de las prcticas ms importantes que puede hacer por su salud. ? La Delorise Shiner de los adultos deben hacer ejercicio durante al menos 116mnutos por semana. El ejercicio debe aumentar la frecuencia cardaca y pActorla transpiracin (ejercicio de iIndiahoma. ? La mayora de los adultos tambin deben hacer ejercicios de elongacin al mToysRusveces a la semana. Agregue esto al su plan de ejercicio de intensidad moderada. Mantenga un peso saludable.  El ndice de masa corporal ([facility] es una medida que puede utilizarse para identificar posibles problemas de pSan [place] Proporciona una estimacin de la grasa corporal basndose en el peso y la altura. Su mdico puede ayudarle a dRadiation protection practitionerIClearviewy a lScientist, forensico mTheatre managerun peso saludable.  Para las mujeres de 20aos o ms: ? Un IAdcare Hospital Of Worcester Incmenor de 18,5 se considera bajo peso. ? Un IMartinsburg Va Medical Centerentre 18,5 y 24,9 es normal. ? Un IWisconsin Specialty Surgery Center LLCentre 25 y 29,9 se considera sobrepeso. ? Un IMC de 30 o ms se considera obesidad. Observe los niveles de colesterol y lpidos en la sangre.  Debe comenzar a rEnglish as a second language teacherde lpidos y cResearch officer, trade unionen la sangre a los 20aos y luego repetirlos cada 534aos  Es posible que nAutomotive engineerlos niveles de colesterol con mayor frecuencia si: ? Sus niveles de lpidos y colesterol son altos. ? Es mayor de 514HFW ? Presenta un alto riesgo de padecer enfermedades cardacas. DFrisco  pulmn  Se recomienda realizar exmenes de deteccin de cncer de pulmn a personas adultas entre 55 y 80 aos que estn en riesgo de Geophysical data processor de pulmn por sus antecedentes de consumo de tabaco.  Se recomienda una tomografa computarizada de baja dosis de los pulmones todos  los aos a las personas que: ? Fuman actualmente. ? Hayan dejado el hbito en algn momento en los ltimos 15aos. ? Hayan fumado durante 30aos un paquete diario. Un paquete-ao equivale a fumar un promedio de un paquete de cigarrillos diario durante un ao.  Los exmenes de deteccin anuales deben continuar hasta que hayan pasado 15aos desde que dej de fumar.  Ya no debern realizarse si tiene un problema de salud que le impida recibir tratamiento para Management consultant de pulmn. Cncer de mama  Practique la autoconciencia de la mama. Esto significa reconocer la apariencia normal de sus mamas y cmo las siente.  Tambin significa realizar autoexmenes regulares de [place]. Informe a su mdico sobre cualquier cambio, sin importar cun pequeo sea.  Si tiene entre 20 y 30 aos, un mdico debe realizarle un examen clnico de las mamas como parte del examen regular de [place], cada 1 a 3aos.  Si tiene 40aos o ms, debe Academic librarian clnico de las [employer]. Tambin considere realizarse una radiografa de las mamas ([place]) todos los [employer].  Si tiene antecedentes familiares de cncer de mama, hable con su mdico para someterse a un estudio gentico.  Si tiene alto riesgo de Geneticist, molecular de mama, hable con su mdico para someterse a Hotel manager y [employer].  La evaluacin del gen del cncer de mama (BRCA) se recomienda a mujeres que tengan familiares con cnceres relacionados con el BRCA. Los cnceres relacionados con el BRCA incluyen los siguientes: ? Mama. ? Ovario. ? Trompas. ? Cnceres de peritoneo.  Los resultados de la evaluacin determinarn la necesidad de asesoramiento gentico y de [place] de BRCA1 y BRCA2. Cncer de cuello del tero El mdico puede recomendarle que se haga pruebas peridicas de deteccin de cncer de los rganos de la pelvis (ovarios, tero y vagina). Estas pruebas incluyen un examen plvico, que abarca  controlar si se produjeron cambios microscpicos en la superficie del cuello del tero (prueba de Papanicolaou). Pueden recomendarle que se haga estas pruebas cada 3aos, a partir de los 21aos.  A las mujeres que tienen entre 30 y 65aos, los mdicos pueden recomendarles que se sometan a exmenes plvicos y pruebas de Papanicolaou cada 3aos, o a la prueba de Papanicolaou y el examen plvico en combinacin con estudios de deteccin del virus del papiloma humano (VPH) cada 5aos. Algunos tipos de VPH aumentan el riesgo de Geneticist, molecular de cuello del tero. La prueba para la deteccin del VPH tambin puede realizarse a mujeres de cualquier edad cuyos resultados de la prueba de Papanicolaou no sean claros.  Es posible que otros mdicos no recomienden exmenes de deteccin a mujeres no embarazadas que se consideran sujetos de bajo riesgo de Geneticist, molecular de pelvis y que no tienen sntomas. Pregntele al mdico si un examen plvico de deteccin es adecuado para usted.  Si ha recibido un tratamiento para Management consultant cervical o una enfermedad que podra causar cncer, necesitar realizarse una prueba de Papanicolaou y controles durante al menos 20 aos de concluido el [place]. Si no se ha hecho el Papanicolaou con regularidad, debern volver a evaluarse los factores de riesgo (como tener un nuevo compaero sexual),  para determinar si debe realizarse los estudios nuevamente. Algunas mujeres sufren problemas mdicos que aumentan la probabilidad de Museum/gallery curator cncer de cuello del tero. En estos casos, el mdico podr [employer] se realicen controles y pruebas de Papanicolaou con ms frecuencia. Cncer colorrectal  Este tipo de cncer puede detectarse y a menudo prevenirse.  Por lo general, los estudios de rutina se deben Medical laboratory scientific officer a Field seismologist a Proofreader de los 35 aos y [place] 65 aos.  Sin embargo, el mdico podr aconsejarle que lo haga antes, si tiene factores de riesgo para el cncer de  colon.  Tambin puede recomendarle que use un kit de prueba para Hydrologist en la materia fecal.  Es posible que se use una pequea cmara en el extremo de un tubo para examinar directamente el colon (sigmoidoscopia o colonoscopia) a fin de [name] formas tempranas de cncer colorrectal.  Los exmenes de rutina generalmente comienzan a los 75aos.  El examen directo del colon se debe repetir cada 5 a 10aos hasta los 75aos. Sin embargo, es posible que se realicen exmenes con mayor frecuencia, si se detectan formas tempranas de plipos precancerosos o pequeos bultos. Cncer de piel  Revise la piel de la cabeza a los pies con regularidad.  Informe a su mdico si aparecen nuevos lunares o los que tiene se modifican, especialmente en su forma y color.  Tambin notifique al mdico si tiene un lunar que es ms grande que el tamao de una goma de lpiz.  Siempre use pantalla solar. Aplique pantalla solar de Kerry Dory y repetida a lo largo del Training and development officer.  Protjase usando mangas y [employer], un sombrero de ala ancha y gafas para el sol, siempre que se encuentre en el exterior. ENFERMEDADES CARDACAS, DIABETES E HIPERTENSIN ARTERIAL  La hipertensin arterial causa enfermedades cardacas y [place] el riesgo de ictus. La hipertensin arterial es ms probable en los siguientes casos: ? Las personas que tienen la presin arterial en el extremo del rango normal (100-139/85-89 mm Hg). ? [employer] con sobrepeso u obesidad. ? Scientist, water quality.  Si usted tiene entre 18 y 39 aos, debe medirse la presin arterial cada 3 a 5 aos. Si usted tiene 40 aos o ms, debe medirse la presin arterial [employer]. Debe medirse la presin arterial dos veces: una vez cuando est en un hospital o una clnica y la otra vez cuando est en otro sitio. Registre el promedio de [employer]. Para controlar su presin arterial cuando no est en un hospital o [place], puede usar  lo siguiente: ? Jorje Guild automtica para medir la presin arterial en una farmacia. ? Un monitor para medir la presin arterial en el hogar.  Si tiene entre 71 y 60 aos, consulte a su mdico si debe tomar aspirina para prevenir el ictus.  Realcese exmenes de deteccin de la diabetes con regularidad. Esto incluye la toma de [place] de sangre para controlar el nivel de azcar en la sangre durante el [place]. ? Si tiene un peso normal y un bajo riesgo de padecer diabetes, realcese este anlisis cada tres aos despus de los 45aos. ? Si tiene sobrepeso y un alto riesgo de padecer diabetes, considere someterse a este anlisis antes o con mayor frecuencia. PREVENCIN DE INFECCIONES HepatitisB  Si tiene un riesgo ms alto de Museum/gallery curator hepatitis B, debe someterse a un examen de deteccin de este virus. Se considera que tiene un alto riesgo de contraer hepatitis B si: ? Naci en un  pas donde la hepatitis B es frecuente. Pregntele a su mdico qu pases son considerados de Public affairs consultant. ? Sus padres nacieron en un pas de alto riesgo y usted no recibi una vacuna que lo proteja contra la hepatitis B (vacuna contra la hepatitis B). ? [place]. ? [place] agujas para inyectarse drogas. ? Vive con alguien que tiene hepatitis B. ? Ha tenido sexo con alguien que tiene hepatitis B. ? Recibe tratamiento de hemodilisis. ? Toma ciertos medicamentos para el cncer, trasplante de rganos y afecciones autoinmunitarias. Hepatitis C  Se recomienda un anlisis de [place] para: ? [employer] 1945 y 1965. ? Todas las personas que tengan un riesgo de haber contrado hepatitis C. Enfermedades de transmisin sexual (ETS).  Debe realizarse pruebas de deteccin de enfermedades de transmisin sexual (ETS), incluidas gonorrea y clamidia si: ? Es sexualmente activo y es menor de 50KXF. ? Es mayor de 24aos, y Investment banker, operational informa que corre riesgo de tener este tipo de infecciones. ? La  actividad sexual ha cambiado desde que le hicieron la ltima prueba de deteccin y tiene un riesgo mayor de Best boy clamidia o Radio broadcast assistant. Pregntele al mdico si usted tiene riesgo.  Si no tiene el VIH, pero corre riesgo de infectarse por el virus, se recomienda tomar diariamente un medicamento recetado para evitar la infeccin. Esto se conoce como profilaxis previa a la exposicin. Se considera que est en riesgo si: ? Es [place] sexualmente y no [place] preservativos habitualmente o no conoce el estado del VIH de sus Advertising copywriter. ? Se inyecta drogas. ? Es [place] sexualmente con Ardelia Mems pareja que tiene VIH. Consulte a su mdico para saber si tiene un alto riesgo de infectarse por el VIH. Si opta por comenzar la profilaxis previa a la exposicin, primero debe realizarse anlisis de deteccin del VIH. Luego, le harn anlisis cada 5mses mientras est tomando los medicamentos para la profilaxis previa a la exposicin. ELakewalk Surgery Center Si es premenopusica y puede quedar e[place] solicite a su mdico asesoramiento previo a la concepcin.  Si puede quedar embarazada, tome 400 a [id number](mcg) de cido fAnheuser-Busch  Si desea evitar el embarazo, hable con su mdico sobre el control de la natalidad (anticoncepcin). OSTEOPOROSIS Y MENOPAUSIA  La osteoporosis es una enfermedad en la que los huesos pierden los minerales y la fuerza por el avance de la edad. El resultado pueden ser fracturas graves en los h[place] El riesgo de osteoporosis puede identificarse con uArdelia Memsprueba de densidad sea.  Si tiene 65aos o ms, o si est en riesgo de sufrir osteoporosis y fracturas, pregunte a su mdico si debe someterse a exmenes.  Consulte a su mdico si debe tomar un suplemento de calcio o de vitamina D para reducir el riesgo de osteoporosis.  La menopausia puede presentar ciertos sntomas fsicos y rGaffer  La terapia de reemplazo hormonal puede reducir algunos de estos sntomas y rGaffer Consulte  a su mdico para saber si la terapia de reemplazo hormonal es conveniente para usted. INSTRUCCIONES PARA EL CUIDADO EN EL HOGAR  Realcese los estudios de rutina de la salud, dentales y de lPublic librarian  MWhite [place]ow Lake  No consuma ningn producto que contenga tabaco, lo que incluye cigarrillos, tabaco de mHigher education careers advisero cPsychologist, sport and exercise  Si est embarazada, no beba alcohol.  Si est amamantando, reduzca el consumo de alcohol y la frecuencia con la que consume.  Si es mujer y no est embarazada limite el  consumo de alcohol a no ms de 1 medida por da. Una medida equivale a 12onzas de cerveza, 5onzas de vino o 1onzas de bebidas alcohlicas de alta graduacin.  No consuma drogas.  No comparta agujas.  Solicite ayuda a su mdico si necesita apoyo o informacin para abandonar las drogas.  Informe a su mdico si a menudo se siente deprimido.  Notifique a su mdico si alguna vez ha sido vctima de abuso o si no se siente seguro en su hogar. Esta informacin no tiene Marine scientist el consejo del mdico. Asegrese de hacerle al mdico cualquier pregunta que tenga. Document Released: 03/24/2011 Document Revised: 04/25/2014 Document Reviewed: 01/06/2015 Elsevier Interactive Patient Education  [employer].

## 2017-02-06 LAB — URINALYSIS W MICROSCOPIC + REFLEX CULTURE
Bilirubin Urine: NEGATIVE
Glucose, UA: NEGATIVE
Hyaline Cast: NONE SEEN /LPF
Ketones, ur: NEGATIVE
Nitrites, Initial: NEGATIVE
Protein, ur: NEGATIVE
Specific Gravity, Urine: 1.02 (ref 1.001–1.03)
pH: 6 (ref 5.0–8.0)

## 2017-02-06 LAB — URINE CULTURE
MICRO NUMBER:: 81176007
SPECIMEN QUALITY:: ADEQUATE

## 2017-02-06 LAB — CULTURE INDICATED

## 2017-02-13 ENCOUNTER — Ambulatory Visit (INDEPENDENT_AMBULATORY_CARE_PROVIDER_SITE_OTHER): Payer: BLUE CROSS/BLUE SHIELD | Admitting: Internal Medicine

## 2017-02-13 VITALS — BP 115/68 | HR 55 | Temp 98.0°F | Ht 64.5 in | Wt 197.9 lb

## 2017-02-13 DIAGNOSIS — R300 Dysuria: Secondary | ICD-10-CM

## 2017-02-13 DIAGNOSIS — L989 Disorder of the skin and subcutaneous tissue, unspecified: Secondary | ICD-10-CM | POA: Insufficient documentation

## 2017-02-13 DIAGNOSIS — Z87440 Personal history of urinary (tract) infections: Secondary | ICD-10-CM | POA: Diagnosis not present

## 2017-02-13 DIAGNOSIS — R21 Rash and other nonspecific skin eruption: Secondary | ICD-10-CM

## 2017-02-13 LAB — PAP, TP IMAGING W/ HPV RNA, RFLX HPV TYPE 16,18/45: HPV DNA High Risk: DETECTED — AB

## 2017-02-13 LAB — POCT URINALYSIS DIPSTICK
Bilirubin, UA: NEGATIVE
Glucose, UA: NEGATIVE
Ketones, UA: NEGATIVE
Nitrite, UA: NEGATIVE
Protein, UA: NEGATIVE
Spec Grav, UA: 1.025 (ref 1.010–1.025)
Urobilinogen, UA: 0.2 E.U./dL
pH, UA: 6 (ref 5.0–8.0)

## 2017-02-13 LAB — HPV TYPE 16 AND 18/45 RNA
HPV Type 16 RNA: NOT DETECTED
HPV Type 18/45 RNA: NOT DETECTED

## 2017-02-13 MED ORDER — TRIAMCINOLONE ACETONIDE 0.1 % EX CREA: 1.0000 "application " | TOPICAL_CREAM | Freq: Two times a day (BID) | CUTANEOUS | 2 refills | Status: DC

## 2017-02-13 NOTE — Progress Notes (Signed)
   CC: Rash  HPI:  [name] is a 46 y.o. female with past medical history outlined below here for rash. For the details of today's visit, please refer to the assessment and plan.  Past Medical History:  Diagnosis Date  . Abnormal Pap smear    patient states she had cancer that was removed from her cervix  . Asthma    when pregnant/ once 6 yrs ago only time asthma attack the dr said  . Cancer ([place])   . Clotting disorder ([place])   . Gestational diabetes   . Intramural leiomyoma of uterus 02/29/2016  . Pulmonary embolism ([place])   . Renal insufficiency   . Vitamin D deficiency   . Vitamin D deficiency     Review of Systems  Skin: Positive for itching and rash.     Physical Exam:  Vitals:   02/13/17 0938  BP: 115/68  Pulse: (!) 55  Temp: 98 F (36.7 C)  TempSrc: Oral  SpO2: 100%  Weight: 197 lb 14.4 oz (89.8 kg)  Height: 5' 4.5" (1.638 m)    Constitutional: NAD, appears comfortable Cardiovascular: RRR, no murmurs, rubs, or gallops.  Pulmonary/Chest: CTAB, no wheezes, rales, or rhonchi.  Skin: Erythematous dry skin with excoriations on extensor surface of bilateral upper extremities  Psychiatric: Normal mood and affect  Assessment & Plan:   See Encounters Tab for problem based charting.  Patient discussed with Dr. Eppie Gibson

## 2017-02-13 NOTE — Assessment & Plan Note (Signed)
Patient has complaint of dysuria, despite recently completed a course of Bactrim for UTI. Urine culture 02/03/17 grew multiple organisms consistent with normal flora. Infection appears to have been adequately treated on repeat POC urinalysis today. Unclear reason for persistent symptoms. She denies vaginal discharge. She is not sexually active. She declines pelvic exam today. Symptoms may be residual from recent infection.  -- Monitor for now

## 2017-02-13 NOTE — Assessment & Plan Note (Signed)
Patient has a rash on the extensor surface of her bilateral upper extremities consistent with either contact dermatitis vs. atopic dermatitis. She reports the rash comes and goes, but often flairs this time of year. Reports it previously resolved with a prescription for clobetasol cream. Advised using hypoallergenic laundry detergents and soaps and moisturizing with hypoallergenic lotions such as Eucerin or Cerave. Will prescribe a medium potency topical corticosteroid. -- Triamcinolone 0.1% BID x 2 weeks, then as needed -- Hypoallergenic moisturizing creams (cerave or eucerin) daily  -- Follow up as needed

## 2017-02-13 NOTE — Patient Instructions (Signed)
[name],  It was a pleasure to see you today. For your rash, please apply triamcinolone cream twice a day for the next 1-2 weeks (then as needed after that). Please also apply a moisturizing cream once a day. I recommend Eucerin or cerave as these will not irritate your skin. If you have any questions or concerns, call our clinic at [phone] or after hours call [phone] and ask for the internal medicine resident on call. Thank you!  - Dr. Philipp Ovens

## 2017-02-15 ENCOUNTER — Ambulatory Visit (INDEPENDENT_AMBULATORY_CARE_PROVIDER_SITE_OTHER): Payer: BLUE CROSS/BLUE SHIELD | Admitting: Podiatry

## 2017-02-15 DIAGNOSIS — L603 Nail dystrophy: Secondary | ICD-10-CM

## 2017-02-15 DIAGNOSIS — M79676 Pain in unspecified toe(s): Secondary | ICD-10-CM

## 2017-02-15 DIAGNOSIS — B351 Tinea unguium: Secondary | ICD-10-CM

## 2017-02-15 NOTE — Patient Instructions (Signed)

## 2017-02-17 NOTE — Progress Notes (Signed)
   Subjective: Patient presents today for follow up evaluation of the right great toenail. She states the nail is growing into the toe and is very painful. This has been on ongoing problem for the patient. Patient presents today for further treatment and evaluation.   Past Medical History:  Diagnosis Date  . Abnormal Pap smear    patient states she had cancer that was removed from her cervix  . Asthma    when pregnant/ once 6 yrs ago only time asthma attack the dr said  . Cancer ([place])   . Clotting disorder ([place])   . Gestational diabetes   . Intramural leiomyoma of uterus 02/29/2016  . Pulmonary embolism ([place])   . Renal insufficiency   . Vitamin D deficiency   . Vitamin D deficiency     Objective:  General: Well developed, nourished, in no acute distress, alert and oriented x3   Dermatology: Skin is warm, dry and supple bilateral. Hyperkeratotic, discolored, thickened, onychodystrophy of nails noted bilaterally. The remaining nails appear unremarkable at this time. There are no open sores, lesions.  Vascular: Dorsalis Pedis artery and Posterior Tibial artery pedal pulses palpable. No lower extremity edema noted.   Neruologic: Grossly intact via light touch bilateral.  Musculoskeletal: Muscular strength within normal limits in all groups bilateral. Normal range of motion noted to all pedal and ankle joints.   Assesement: #1 Painful dystrophic right great toenail  Plan of Care:  1. Patient evaluated.  2. Discussed treatment alternatives and plan of care. Explained nail avulsion procedure and post procedure course to patient. 3. Patient opted for permanent total nail avulsion.  4. Prior to procedure, local anesthesia infiltration utilized using 3 ml of a 50:50 mixture of 2% plain lidocaine and 0.5% plain marcaine in a normal hallux block fashion and a betadine prep performed.  5. Partial permanent nail avulsion with chemical matrixectomy performed using [id number] applications of  phenol followed by alcohol flush.  6. Light dressing applied. 7. Return to clinic in 2 weeks.   Edrick Kins, DPM [facility]  Dr. Edrick Kins, [address]                                        [place], [place] [address]                Office [phone]  Fax [phone]

## 2017-02-22 ENCOUNTER — Ambulatory Visit: Payer: BLUE CROSS/BLUE SHIELD | Admitting: Podiatry

## 2017-02-22 ENCOUNTER — Encounter: Payer: Self-pay | Admitting: Podiatry

## 2017-02-22 DIAGNOSIS — L603 Nail dystrophy: Secondary | ICD-10-CM

## 2017-02-22 MED ORDER — GENTAMICIN SULFATE 0.1 % EX CREA: 1.0000 "application " | TOPICAL_CREAM | Freq: Three times a day (TID) | CUTANEOUS | 1 refills | Status: DC

## 2017-02-23 NOTE — Addendum Note (Signed)
Addended by: Oval Linsey D on: 02/23/2017 01:35 PM   Modules accepted: Level of Service

## 2017-02-23 NOTE — Progress Notes (Signed)
Patient ID: [facility] [name], female   DOB: 1970-07-20, 46 y.o.   MRN: [id number]  Case discussed with Dr. Philipp Ovens at the time of the visit.  We reviewed the resident's history and exam and pertinent patient test results.  I agree with the assessment, diagnosis and plan of care documented in the resident's note.

## 2017-02-26 NOTE — Progress Notes (Signed)
   Subjective: Patient presents today for follow up evaluation status post total permanent nail avulsion of the right great toe. She is concerned for possible infection reporting bloody drainage from the area. She also reports associated tenderness to the touch at the base of the nail bed. She has been soaking in Epsom salt which helps alleviate the symptoms. She is here for further evaluation and treatment.    Past Medical History:  Diagnosis Date  . Abnormal Pap smear    patient states she had cancer that was removed from her cervix  . Asthma    when pregnant/ once 6 yrs ago only time asthma attack the dr said  . Cancer ([place])   . Clotting disorder ([place])   . Gestational diabetes   . Intramural leiomyoma of uterus 02/29/2016  . Pulmonary embolism ([place])   . Renal insufficiency   . Vitamin D deficiency   . Vitamin D deficiency     Objective: Skin is warm, dry and supple. Nail bed and respective nail fold appears to be healing appropriately. Open wound to the associated nail fold with a granular wound base and moderate amount of fibrotic tissue. Minimal drainage noted. Mild erythema around the periungual region likely due to phenol chemical matricectomy.  Assessment: #1 postop permanent total nail avulsion right great toe #2 open wound periungual nail fold and nail bed of respective digit.   Plan of care: #1 patient was evaluated  #2 debridement of open wound was performed to the periungual border and nail fold of the respective toe using a currette. Antibiotic ointment and Band-Aid was applied. #3 Prescription for gentamicin cream given to patient. #4 Return to clinic in two weeks.   Edrick Kins, DPM [facility]  Dr. Edrick Kins, [address]                                        [place], [place] [address]                Office [phone]  Fax [phone]

## 2017-03-01 ENCOUNTER — Ambulatory Visit: Payer: BLUE CROSS/BLUE SHIELD | Admitting: Podiatry

## 2017-03-06 ENCOUNTER — Encounter: Payer: Self-pay | Admitting: Obstetrics & Gynecology

## 2017-03-06 ENCOUNTER — Ambulatory Visit: Payer: BLUE CROSS/BLUE SHIELD | Admitting: Obstetrics & Gynecology

## 2017-03-06 VITALS — BP 122/84

## 2017-03-06 DIAGNOSIS — B977 Papillomavirus as the cause of diseases classified elsewhere: Secondary | ICD-10-CM

## 2017-03-06 DIAGNOSIS — Z113 Encounter for screening for infections with a predominantly sexual mode of transmission: Secondary | ICD-10-CM

## 2017-03-06 NOTE — Progress Notes (Signed)
    Green 1970-05-16 [id number]        46 y.o.  [id number] Married  RP:  HPV HR positive for Colposcopy  HPI:  Pap neg/HPV HR pos.  HPV 16-18-45 neg on 02/03/2017.  Past medical history,surgical history, problem list, medications, allergies, family history and social history were all reviewed and documented in the EPIC chart.  Directed ROS with pertinent positives and negatives documented in the history of present illness/assessment and plan.  Exam:  Vitals:   03/06/17 0928  BP: 122/84   General appearance:  Normal  Colposcopy Procedure Note [facility] [name] 03/06/2017  Indications:  HPV HR pos  Procedure Details  The risks and benefits of the procedure and Verbal informed consent obtained.  Speculum placed in vagina and excellent visualization of cervix achieved, cervix swabbed x 3 with acetic acid solution.  Findings:  Cervix colposcopy: Physical Exam  Genitourinary:      Vaginal colposcopy:  Normal   Vulvar colposcopy:  Grossly normal  Perirectal colposcopy:  Grossly normal   Specimens: Cervical Bx at 3 O'clock  Complications:  None.  Hemostasis with Silver Nitrate .  Plan:  Pending cervical Bx   Assessment/Plan:  46 y.o. [id number]   1. HPV in female HPV HR pos/HPV 16-18-45 neg. Pap neg.  Colposcopy with Cervical Bx.  Possible CIN 1, pending patho. - Pathology Report  2. Screening examination for venereal disease Recommend condom use. - C. trachomatis/N. gonorrhoeae RNA - HIV antibody (with reflex) - RPR - Hepatitis B Surface AntiGEN - Hepatitis C Antibody  Princess Bruins MD, 9:48 AM 03/06/2017

## 2017-03-07 ENCOUNTER — Encounter: Payer: Self-pay | Admitting: Obstetrics & Gynecology

## 2017-03-07 LAB — HIV ANTIBODY (ROUTINE TESTING W REFLEX): HIV 1&2 Ab, 4th Generation: NONREACTIVE

## 2017-03-07 LAB — HEPATITIS C ANTIBODY
Hepatitis C Ab: NONREACTIVE
SIGNAL TO CUT-OFF: 0.04 (ref <1.00)

## 2017-03-07 LAB — C. TRACHOMATIS/N. GONORRHOEAE RNA
C. trachomatis RNA, TMA: NOT DETECTED
N. gonorrhoeae RNA, TMA: NOT DETECTED

## 2017-03-07 LAB — HEPATITIS B SURFACE ANTIGEN: Hepatitis B Surface Ag: NONREACTIVE

## 2017-03-07 LAB — RPR: RPR Ser Ql: NONREACTIVE

## 2017-03-07 NOTE — Patient Instructions (Signed)
1. HPV in female HPV HR pos/HPV 16-18-45 neg. Pap neg.  Colposcopy with Cervical Bx.  Possible CIN 1, pending patho. - Pathology Report  2. Screening examination for venereal disease Recommend condom use. - C. trachomatis/N. gonorrhoeae RNA - HIV antibody (with reflex) - RPR - Hepatitis B Surface AntiGEN - Hepatitis C Antibody  Barry Brunner, fue un placer verla de nuevo hoy!  Voy a informarla de sus resultados de biopsia y de Tour manager.   Virus del papiloma humano (Human Papillomavirus) El virus del Engineer, technical sales (VPH) es la enfermedad de transmisin sexual (ETS) ms frecuente y es altamente contagiosa. Las infecciones por el virus del VPH causan verrugas y cncer en la parte externa del tero (cuello uterino), el canal del parto(vagina), la abertura del canal de parto (vulva), y el ano. Hay ms de 100 tipos de VPH. Por lo general, el VPH no causa sntomas, salvo por las lesiones similares a las verrugas que aparecen en la garganta o las verrugas genitales que puede ver o Quarry manager. Las personas pueden estar infectadas por largos perodos y transmitirlo a otras sin saberlo. [place] se propaga de Ardelia Mems persona a otra a travs del contacto sexual. Angelena Sole abarca la actividad sexual vaginal, oral y anal. FACTORES DE RIESGO  Mantener relaciones sexuales sin proteccin. Puede transmitirse practicando sexo vaginal, anal u oral.  Tener varios compaeros sexuales.  Tener un compaero sexual que tiene otros Editor, commissioning.  Tener o haber tenido otras enfermedades de transmisin sexual.  SIGNOS Y SNTOMAS La mayora de las personas que tienen VPH no presentan ningn sntoma. Si se presentan sntomas, estos pueden incluir los siguientes:  Lesiones similares a Wellsite geologist garganta (por practicar sexo oral).  Verrugas en la piel infectada o en las membranas mucosas.  Verrugas genitales que [employer], arder o Therapist, art.  Verrugas genitales que pueden ser dolorosas o [place]. DIAGNSTICO Por lo general, ante la presencia de lesiones similares a las verrugas en la garganta o de verrugas genitales, el mdico puede diagnosticar el VPH mediante un examen fsico.  Las verrugas genitales se observan fcilmente a simple vista.  Actualmente, no existe un anlisis aprobado por la Administracin de [place] y [employer] de los [place] (Transport planner, FDA) para Medical sales representative [employer].  En las mujeres, la prueba de Papanicolaou puede detectar clulas infectadas con el VPH.  Se utiliza un dispositivo para ver el cuello del tero (colposcopa). La colposcopa se indica si el examen plvico o la prueba de Papanicolaou no son normales. Durante la colposcopa se extrae Truddie Coco de tejido (biopsia). TRATAMIENTO No existe un tratamiento para el propio virus. Sin embargo, hay tratamientos para los problemas de salud y los sntomas que puede Ecologist. Su mdico lo controlar rigurosamente una vez comenzado Dispensing optician. Esto se debe a que Public affairs consultant y podra requerir tratamiento nuevamente. El tratamiento del VPH puede incluir lo siguiente:  Medicamentos, que pueden inyectarse o aplicarse en forma de crema, locin o gel.  Uso de una sonda para aplicar fro extremo (crioterapia).  Aplicacin de un haz de luz intenso (tratamiento con lser).  Uso de una sonda para aplicar calor extremo (electrocauterizacin).  Ciruga. [place] los medicamentos solamente como se lo haya indicado el mdico.  Use cremas de venta libre para la picazn o la irritacin tal como se lo haya indicado el mdico.  Concurra a todas las visitas de  control como se lo haya indicado el mdico. Esto es importante.  No toque ni rasque las verrugas.  No trate las verrugas genitales con los medicamentos utilizados para el tratamiento de las verrugas de las manos.  No puede Data processing manager.  No utilice tampones ni duchas vaginales durante el tratamiento del VPH.  Informe a su compaero sexual acerca de su infeccin porque tambin podra Warden/ranger.  Si queda embarazada, informe a su mdico que ha tenido VPH. El mdico la controlar rigurosamente durante el embarazo para asegurarse de que el beb est sano.  Despus del tratamiento, use preservativos durante las relaciones sexuales para prevenir futuras infecciones.  Tenga solo un compaero sexual.  No tenga un compaero sexual que tenga otros Editor, commissioning.  PREVENCIN  Converse con su mdico acerca de cmo recibir las Child psychotherapist VPH. Estas vacunas previenen algunas infecciones por el VPH y cnceres. Se recomienda administrar la vacuna a hombres y [employer] 9 y 86 aos. Esta no ser efectiva si usted ya tiene el VPH y no se recomienda a las Games developer.  Se realiza una prueba de Papanicolaou para detectar el cncer de cuello del tero en las mujeres. ? La primera prueba de Papanicolaou debe realizarse a los 21 aos. ? La prueba se repite cada 2aos entre los 21 y los 29aos. ? Despus de los 30 aos, debe realizarse una prueba de Papanicolaou cada tres aos siempre que los tres estudios anteriores sean normales. ? Algunas mujeres sufren problemas mdicos que aumentan la probabilidad de Museum/gallery curator cncer de cuello del tero. Hable con su mdico sobre [employer]. Es muy importante que le informe a su mdico si aparecen nuevos problemas poco despus de su ltima prueba de Papanicolaou. En estos casos, el mdico podr [employer] se realicen controles y pruebas de Papanicolaou con ms frecuencia. ? Las Production manager son para todas las mujeres independientemente de que hayan recibido o no la vacuna para Risk manager. ? Si le han realizado una histerectoma por un problema que no era cncer u otra enfermedad que podra causar cncer, ya no  necesitar un Papanicolaou. Sin embargo, aunque ya no necesite hacerse un Papanicolaou, es una buena idea hacerse un examen regularmente para asegurarse de que no haya otros problemas. ? Si tiene entre 65 y 34 aos y ha tenido Conservator, museum/gallery normal en la prueba de Papanicolaou en los ltimos 10 aos, ya no ser Building services engineer. Sin embargo, aunque ya no necesite hacerse un Papanicolaou, es una buena idea hacerse un examen regularmente para asegurarse de que no haya otros problemas. ? Si ha recibido Lexicographer para Science writer cervical o una enfermedad que podra causar cncer, necesitar realizarse una prueba de Papanicolaou y controles durante al menos 38 aos de concluido el [place]. ? Si no se ha Futures trader con regularidad, debern volver a evaluarse los factores de riesgo (como el tener un nuevo compaero sexual) para Programmer, applications a Actuary.  Es posible que algunas mujeres deban realizarse exmenes de deteccin con mayor frecuencia si presentan un alto riesgo de padecer cncer de cuello del tero.  SOLICITE ATENCIN MDICA SI:  La piel tratada se enrojece, se hincha o duele.  Tiene fiebre.  Siente un Pharmacist, hospital.  Siente bultos o protuberancias tipo granos en la zona genital o alrededor de esta.  Presenta sangrado vaginal o en la zona de tratamiento.  Tiene dolor durante las [employer].  ASEGRESE DE QUE:  Comprende estas instrucciones.  Controlar su afeccin.  Solicitar ayuda de inmediato si no mejora o si empeora.  Esta informacin no tiene Marine scientist el consejo del mdico. Asegrese de hacerle al mdico cualquier pregunta que tenga. Document Released: 07/21/2008 Document Revised: 07/27/2015 Document Reviewed: 07/10/2013 Elsevier Interactive Patient Education  2017 [employer].

## 2017-03-08 LAB — TISSUE SPECIMEN

## 2017-03-08 LAB — PATHOLOGY

## 2017-03-13 ENCOUNTER — Encounter: Payer: Self-pay | Admitting: Podiatry

## 2017-03-13 ENCOUNTER — Ambulatory Visit (INDEPENDENT_AMBULATORY_CARE_PROVIDER_SITE_OTHER): Payer: BLUE CROSS/BLUE SHIELD | Admitting: Podiatry

## 2017-03-13 ENCOUNTER — Ambulatory Visit: Payer: BLUE CROSS/BLUE SHIELD | Admitting: Podiatry

## 2017-03-13 DIAGNOSIS — S91209S Unspecified open wound of unspecified toe(s) with damage to nail, sequela: Secondary | ICD-10-CM

## 2017-03-15 NOTE — Progress Notes (Signed)
   Subjective: Patient presents today for follow up evaluation status post total permanent nail avulsion of the right great toe. She reports continued soreness of the nail bed. She states she has been applying the gentamicin cream as directed but has not been soaking the toe as instructed. Patient is here for further evaluation and treatment.    Past Medical History:  Diagnosis Date  . Abnormal Pap smear    patient states she had cancer that was removed from her cervix  . Asthma    when pregnant/ once 6 yrs ago only time asthma attack the dr said  . Cancer ([place])   . Clotting disorder ([place])   . Gestational diabetes   . Intramural leiomyoma of uterus 02/29/2016  . Pulmonary embolism ([place])   . Renal insufficiency   . Vitamin D deficiency   . Vitamin D deficiency     Objective: Skin is warm, dry and supple. Nail bed and respective nail fold appears to be healing appropriately. Open wound to the associated nail fold with a granular wound base and moderate amount of fibrotic tissue. Minimal drainage noted. Mild erythema around the periungual region likely due to phenol chemical matricectomy.  Assessment: #1 postop permanent total nail avulsion right great toe #2 open wound periungual nail fold and nail bed of respective digit.   Plan of care: #1 patient was evaluated  #2 debridement of open wound was performed to the periungual border and nail fold of the respective toe using a currette. Antibiotic ointment and Band-Aid was applied. #3 Discontinue using gentamicin cream. #4 Recommended good shoe gear. #5 Return to clinic when necessary.    Edrick Kins, DPM [facility]  Dr. Edrick Kins, [address]                                        [place], [place] [address]                Office [phone]  Fax [phone]

## 2017-03-17 DIAGNOSIS — H5711 Ocular pain, right eye: Secondary | ICD-10-CM | POA: Diagnosis not present

## 2017-03-17 DIAGNOSIS — H538 Other visual disturbances: Secondary | ICD-10-CM | POA: Diagnosis not present

## 2017-03-17 DIAGNOSIS — H52223 Regular astigmatism, bilateral: Secondary | ICD-10-CM | POA: Diagnosis not present

## 2017-03-29 ENCOUNTER — Ambulatory Visit: Payer: BLUE CROSS/BLUE SHIELD | Admitting: Family Medicine

## 2017-03-29 ENCOUNTER — Encounter: Payer: Self-pay | Admitting: Family Medicine

## 2017-03-29 ENCOUNTER — Other Ambulatory Visit: Payer: Self-pay

## 2017-03-29 ENCOUNTER — Ambulatory Visit: Payer: Self-pay | Admitting: *Deleted

## 2017-03-29 VITALS — BP 112/78 | HR 111 | Temp 99.3°F | Resp 16 | Ht 66.14 in | Wt 197.0 lb

## 2017-03-29 DIAGNOSIS — R5383 Other fatigue: Secondary | ICD-10-CM | POA: Diagnosis not present

## 2017-03-29 DIAGNOSIS — J020 Streptococcal pharyngitis: Secondary | ICD-10-CM

## 2017-03-29 DIAGNOSIS — J029 Acute pharyngitis, unspecified: Secondary | ICD-10-CM | POA: Diagnosis not present

## 2017-03-29 LAB — POCT RAPID STREP A (OFFICE): Rapid Strep A Screen: POSITIVE — AB

## 2017-03-29 LAB — POCT INFLUENZA A/B
Influenza A, POC: NEGATIVE
Influenza B, POC: NEGATIVE

## 2017-03-29 MED ORDER — AMOXICILLIN 500 MG PO TABS: 1000.0000 mg | ORAL_TABLET | Freq: Two times a day (BID) | ORAL | 0 refills | Status: DC

## 2017-03-29 NOTE — Telephone Encounter (Signed)
Spanish speaking pt called in c/o abd pain after taking "a pill" that Dr. Tamala Julian prescribed for her this morning for flu like symptoms.   We were using the [place] [phone].   While asking her the protocol instructions for abd pain static came on the interpreter's line and then we were disconnected from the interpreter.  My co-worker who speaks Spanish got on the line with the pt.   I advised her via Helene Kelp that the pt needs to go to the ED per the protocol for sudden abd pain. Pt did not want to go to the ED she was to call the doctor back.   She has her kids with her.   I still let her know she needed to go to the ED.   Let her know that [facility] would be her closest EDs.  She said she would call her doctor back.

## 2017-03-29 NOTE — Patient Instructions (Addendum)
     IF you received an x-ray today, you will receive an invoice from [facility]. Please contact [facility] at [phone] with questions or concerns regarding your invoice.   IF you received labwork today, you will receive an invoice from [place]. Please contact [facility] at [phone] with questions or concerns regarding your invoice.   Our billing staff will not be able to assist you with questions regarding bills from these companies.  You will be contacted with the lab results as soon as they are available. The fastest way to get your results is to activate your My Chart account. Instructions are located on the last page of this paperwork. If you have not heard from [place] regarding the results in 2 weeks, please contact this office.      Faringitis estreptoccica (Strep Throat) La faringitis estreptoccica es una infeccin que se produce en la garganta y [place] son las bacterias. Esta enfermedad se transmite de [place] persona a otra a travs de la tos, el estornudo o el contacto cercano. [place] los medicamentos de venta libre y los recetados solamente como se lo haya indicado el [place] antibitico como se lo indic su mdico. No deje de tomar los medicamentos aunque comience a Sports administrator.  Si otros miembros de la familia tambin tienen dolor de garganta o fiebre, deben ir al mdico. Comida y bebida  No comparta los alimentos, las tazas ni los artculos personales.  Intente consumir alimentos blandos hasta que el dolor de garganta mejore.  Beba suficiente lquido para mantener el pis (orina) claro o de color amarillo plido. Instrucciones generales  Enjuguese la boca (haga grgaras) con [employer] de agua con sal 3 o 4veces al da, o cuando sea necesario. Para preparar la mezcla de agua y sal, disuelva de media a 1cucharadita de sal en 1taza de agua tibia.  Asegrese de que todas las personas que viven en  su casa se laven [employer].  Reposo.  No concurra a la escuela o al [place] que haya tomado los antibiticos durante 24horas.  Concurra a todas las visitas de control como se lo haya indicado el mdico. Esto es importante. SOLICITE AYUDA SI:  El cuello est cada vez ms hinchado.  Le aparece una erupcin cutnea, tos o dolor de odos.  Tose y expectora un lquido espeso de color verde o amarillo amarronado, o con [place].  El dolor no mejora con medicamentos.  Los problemas empeoran en vez de Teacher, English as a foreign language.  Tiene fiebre. SOLICITE AYUDA DE INMEDIATO SI:  Vomita.  Siente un dolor de cabeza muy intenso.  Le duele el cuello o siente que est rgido.  Siente dolor en el pecho o le falta el aire.  Tiene dolor de garganta intenso, babea o tiene cambios en la voz.  Tiene el cuello hinchado o la piel est enrojecida y sensible.  Tiene la boca seca u orina menos de lo normal.  Est cada vez ms cansado o le resulta difcil despertarse.  [place] articulaciones o estn enrojecidas. Esta informacin no tiene Marine scientist el consejo del mdico. Asegrese de hacerle al mdico cualquier pregunta que tenga. Document Released: 07/01/2008 Document Revised: 12/24/2014 Document Reviewed: 07/28/2014 Elsevier Interactive Patient Education  [employer].

## 2017-03-29 NOTE — Telephone Encounter (Signed)
  Reason for Disposition . [1] SEVERE pain (e.g., excruciating) AND [2] present > 1 hour  Answer Assessment - Initial Assessment Questions 1. LOCATION: "Where does it hurt?"      I saw the doctor this morning for flu like symptoms.   Got a pill.  My abd is hurting below the waist and numb.   2. RADIATION: "Does the pain shoot anywhere else?" (e.g., chest, back)     I took some Tylenol  but it did not take the pain away. 3. ONSET: "When did the pain begin?" (e.g., minutes, hours or days ago)       4. SUDDEN: "Gradual or sudden onset?"     *No Answer* 5. PATTERN "Does the pain come and go, or is it constant?"    - If constant: "Is it getting better, staying the same, or worsening?"      (Note: Constant means the pain never goes away completely; most serious pain is constant and it progresses)     - If intermittent: "How long does it last?" "Do you have pain now?"     (Note: Intermittent means the pain goes away completely between bouts)     *No Answer* 6. SEVERITY: "How bad is the pain?"  (e.g., Scale 1-10; mild, moderate, or severe)   - MILD (1-3): doesn't interfere with normal activities, abdomen soft and not tender to touch    - MODERATE (4-7): interferes with normal activities or awakens from sleep, tender to touch    - SEVERE (8-10): excruciating pain, doubled over, unable to do any normal activities      *No Answer* 7. RECURRENT SYMPTOM: "Have you ever had this type of abdominal pain before?" If so, ask: "When was the last time?" and "What happened that time?"      *No Answer* 8. CAUSE: "What do you think is causing the abdominal pain?"     *No Answer* 9. RELIEVING/AGGRAVATING FACTORS: "What makes it better or worse?" (e.g., movement, antacids, bowel movement)     *No Answer* 10. OTHER SYMPTOMS: "Has there been any vomiting, diarrhea, constipation, or urine problems?"       *No Answer* 11. PREGNANCY: "Is there any chance you are pregnant?" "When was your last menstrual period?"     *No Answer*  Protocols used: ABDOMINAL PAIN - [facility]

## 2017-03-29 NOTE — Progress Notes (Signed)
Subjective:    Patient ID: [facility], female    DOB: 1970-07-13, 46 y.o.   MRN: [id number]  03/29/2017  Sore Throat (x 2 days ) and Fatigue (with bodyaches and fever x 2 days )    HPI This 46 y.o. female presents for evaluation of sore throat, fatigue. Onset one day ago.  +Fever Tmax unknwon.  No chills; no sweats.  +HA  R ear pain.  Sore throat diffuse yet worse on RIGHT side. Pain with swallowing.  Pain with talking.  Pain with opening mouth.  No cough.  No vomiting yet + nausea with diarrhea.  No rash.  Taking Tylenol and Alkeseltzer Cold.  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     BP Readings from Last 3 Encounters:  03/29/17 112/78  03/06/17 122/84  02/13/17 115/68   Wt Readings from Last 3 Encounters:  03/29/17 197 lb (89.4 kg)  02/13/17 197 lb 14.4 oz (89.8 kg)  02/03/17 195 lb (88.5 kg)   Immunization History  Administered Date(s) Administered  . Influenza,inj,Quad PF,6+ Mos 05/20/2014, 01/28/2015, 02/03/2017  . Tdap 06/26/2013    Review of Systems  Constitutional: Positive for chills, diaphoresis, fatigue and fever.  HENT: Positive for congestion, ear pain, sore throat, trouble swallowing and voice change. Negative for postnasal drip, rhinorrhea and sinus pressure.   Respiratory: Negative for cough and shortness of breath.   Cardiovascular: Negative for chest pain, palpitations and leg swelling.  Gastrointestinal: Positive for diarrhea and nausea. Negative for abdominal pain, constipation and vomiting.  Skin: Negative for rash.  Neurological: Positive for headaches. Negative for dizziness.    Past Medical History:  Diagnosis Date  . Abnormal Pap smear    patient states she had cancer that was removed from her cervix  . Asthma    when pregnant/ once 6 yrs ago only time asthma attack the dr said  . Cancer ([place])   . Clotting disorder ([place])   . Gestational diabetes   . Intramural leiomyoma of uterus 02/29/2016  . Pulmonary embolism ([place])   . Renal  insufficiency   . Vitamin D deficiency   . Vitamin D deficiency    Past Surgical History:  Procedure Laterality Date  . CHOLECYSTECTOMY     Allergies  Allergen Reactions  . Latex Itching and Rash    Itching with use of condoms   Current Outpatient Medications on File Prior to Visit  Medication Sig Dispense Refill  . gentamicin cream (GARAMYCIN) 0.1 % Apply 1 application 3 (three) times daily topically. 30 g 1  . triamcinolone cream (KENALOG) 0.1 % Apply 1 application topically 2 (two) times daily. 45 g 2  . Vitamin D, Ergocalciferol, (DRISDOL) 50000 units CAPS capsule Take 1 capsule (50,000 Units total) by mouth every 7 (seven) days. 12 capsule 0  . medroxyPROGESTERone (PROVERA) 5 MG tablet Take 1 tablet (5 mg total) by mouth daily. Every 3 months if no spontaneous period. 10 tablet 4   No current facility-administered medications on file prior to visit.    Social History   Socioeconomic History  . Marital status: Married    Spouse name: Not on file  . Number of children: Not on file  . Years of education: Not on file  . Highest education level: Not on file  Social Needs  . Financial resource strain: Not on file  . Food insecurity - worry: Not on file  . Food insecurity - inability: Not on file  . Transportation needs - medical: Not on file  .  Transportation needs - non-medical: Not on file  Occupational History  . Not on file  Tobacco Use  . Smoking status: Never Smoker  . Smokeless tobacco: Never Used  Substance and Sexual Activity  . Alcohol use: Yes    Alcohol/week: 0.0 oz    Comment: Occasionally.  . Drug use: No  . Sexual activity: Yes    Partners: Male    Birth control/protection: Condom  Other Topics Concern  . Not on file  Social History Narrative   Married with four children. Came to [place] from [place] in 2001. Unemployed currently. Spanish speaking.   Family History  Problem Relation Age of Onset  . Diabetes Mother   . Hypertension Mother   .  Hyperlipidemia Father   . Diabetes Father   . Hypertension Father   . Heart disease Sister   . Diabetes Brother   . Hyperlipidemia Brother   . Drug abuse Paternal Grandmother   . Kidney disease Other   . Birth defects Other        anal atresia/stenosis       Objective:    BP 112/78   Pulse (!) 111   Temp 99.3 F (37.4 C) (Oral)   Resp 16   Ht 5' 6.14" (1.68 m)   Wt 197 lb (89.4 kg)   SpO2 97%   BMI 31.66 kg/m  Physical Exam  Constitutional: She is oriented to person, place, and time. She appears well-developed and well-nourished. No distress.  HENT:  Head: Normocephalic and atraumatic.  Right Ear: Tympanic membrane, external ear and ear canal normal.  Left Ear: Tympanic membrane, external ear and ear canal normal.  Nose: Mucosal edema and rhinorrhea present.  Mouth/Throat: Mucous membranes are normal. Posterior oropharyngeal erythema present. No oropharyngeal exudate or posterior oropharyngeal edema.  Eyes: Conjunctivae are normal. Pupils are equal, round, and reactive to light.  Neck: Normal range of motion. Neck supple.  Cardiovascular: Normal rate, regular rhythm and normal heart sounds. Exam reveals no gallop and no friction rub.  No murmur heard. Pulmonary/Chest: Effort normal and breath sounds normal. She has no wheezes. She has no rales.  Neurological: She is alert and oriented to person, place, and time.  Skin: She is not diaphoretic.  Psychiatric: She has a normal mood and affect. Her behavior is normal.  Nursing note and vitals reviewed.  No results found. Depression screen [facility] 2/9 03/29/2017 02/13/2017 11/14/2016 10/27/2016 05/12/2016  Decreased Interest 0 0 0 0 0  Down, Depressed, Hopeless 0 0 0 0 0  PHQ - 2 Score 0 0 0 0 0   Fall Risk  03/29/2017 02/13/2017 11/14/2016 10/27/2016 05/12/2016  Falls in the past year? No No No No No    Results for orders placed or performed in visit on 03/29/17  POCT rapid strep A  Result Value Ref Range   Rapid Strep A  Screen Positive (A) Negative  POCT Influenza A/B  Result Value Ref Range   Influenza A, POC Negative Negative   Influenza B, POC Negative Negative       Assessment & Plan:   1. Sore throat   2. Streptococcal sore throat   3. Fatigue, unspecified type     New onset streptococcal sore throat.  Prescription for amoxicillin provided. Recommend Tylenol alternating with ibuprofen. Return to clinic for inability to swallow. Patient advised will be contagious for the next 24 hours.  Patient should avoid work for 24 hours.  Orders Placed This Encounter  Procedures  . POCT rapid strep  A  . POCT Influenza A/B   Meds ordered this encounter  Medications  . amoxicillin (AMOXIL) 500 MG tablet    Sig: Take 2 tablets (1,000 mg total) by mouth 2 (two) times daily.    Dispense:  40 tablet    Refill:  0    No Follow-up on file.   Rajean Desantiago Elayne Guerin, M.D. Primary Care at [facility] [address] [place], [place]  [address] [phone] phone [phone] fax

## 2017-03-30 NOTE — Telephone Encounter (Signed)
Messages sent to Dr. Tamala Julian

## 2017-04-02 NOTE — Telephone Encounter (Signed)
Call patient for follow-up:  How is she feeling now?  Is she still having abdominal pain?  Did she present to emergency room as advised?  Is she still taking Amoxicillin?

## 2017-04-03 ENCOUNTER — Ambulatory Visit: Payer: Self-pay | Admitting: *Deleted

## 2017-04-03 NOTE — Telephone Encounter (Signed)
Called in using spanish interpreter.    She is c/o Rt lower back pain.  She saw Dr. Tamala Julian at [facility] last Thursday and was given an antibiotic for her tonsils.    She got better but her back still hurts.   She had a kidney infection that was like this before. I got her an appt with Dr. Mitchel Honour for 11:00 04/04/17.   Instructed her to call us back if she gets worse between now and tomorrow's appt.  She verbalized understanding via the [place] interpreter.

## 2017-04-04 ENCOUNTER — Encounter: Payer: Self-pay | Admitting: Emergency Medicine

## 2017-04-04 ENCOUNTER — Ambulatory Visit: Payer: BLUE CROSS/BLUE SHIELD | Admitting: Emergency Medicine

## 2017-04-04 ENCOUNTER — Other Ambulatory Visit: Payer: Self-pay

## 2017-04-04 VITALS — BP 110/68 | HR 78 | Temp 98.2°F | Resp 16 | Ht 66.25 in | Wt 197.0 lb

## 2017-04-04 DIAGNOSIS — R109 Unspecified abdominal pain: Secondary | ICD-10-CM

## 2017-04-04 DIAGNOSIS — Z87442 Personal history of urinary calculi: Secondary | ICD-10-CM

## 2017-04-04 LAB — POCT URINALYSIS DIP (MANUAL ENTRY)
Bilirubin, UA: NEGATIVE
Glucose, UA: NEGATIVE mg/dL
Ketones, POC UA: NEGATIVE mg/dL
Leukocytes, UA: NEGATIVE
Nitrite, UA: NEGATIVE
Protein Ur, POC: NEGATIVE mg/dL
Spec Grav, UA: 1.025 (ref 1.010–1.025)
Urobilinogen, UA: 0.2 E.U./dL
pH, UA: 5.5 (ref 5.0–8.0)

## 2017-04-04 MED ORDER — KETOROLAC TROMETHAMINE 60 MG/2ML IM SOLN: 60.0000 mg | Freq: Once | INTRAMUSCULAR | Status: AC

## 2017-04-04 MED ORDER — TRAMADOL HCL 50 MG PO TABS: 50.0000 mg | ORAL_TABLET | Freq: Three times a day (TID) | ORAL | 0 refills | Status: DC | PRN

## 2017-04-04 NOTE — Patient Instructions (Addendum)
     IF you received an x-ray today, you will receive an invoice from [facility]. Please contact [facility] at [phone] with questions or concerns regarding your invoice.   IF you received labwork today, you will receive an invoice from [place]. Please contact [facility] at [phone] with questions or concerns regarding your invoice.   Our billing staff will not be able to assist you with questions regarding bills from these companies.  You will be contacted with the lab results as soon as they are available. The fastest way to get your results is to activate your My Chart account. Instructions are located on the last page of this paperwork. If you have not heard from [place] regarding the results in 2 weeks, please contact this office.     Clculos renales (Kidney Stones) Los clculos renales (urolitiasis) son masas slidas que se forman en el interior de los riones. El dolor intenso es causado por el movimiento de la piedra a travs del rin, urter, vejiga y Barista (tracto urinario). Cuando la piedra se mueve, el urter hace un espasmo alrededor de la misma. El clculo generalmente se elimina con el pis (la [place]). CUIDADOS EN EL HOGAR  Debe ingerir gran cantidad de lquido para mantener la orina de tono claro o color amarillo plido. Esto ayudar a eliminar la piedra.  Recoja una muestra de orina durante 24 horas como se lo haya indicado el mdico. Tal vez tenga que recoger otra muestra cada 6 o 12 meses.  Cuele la orina con el colador que le han provisto. No orine de otra forma que no sea a travs del Administrator, ni siquiera una vez. Si elimina la piedra, se retendr en el colador. Puede ser tan pequea como un grano de sal. Llvela a su visita con el mdico. Esto ayudar a que el mdico le indique qu puede hacer para tratar de prevenir la ocurrencia de nuevos clculos renales.  Slo tome los medicamentos que le haya indicado su mdico.  Haga cambios en la dieta  diaria como se lo haya indicado el mdico. Es posible que le indiquen lo siguiente: ? Limitar la cantidad de sal que consume. ? Consumir 5 o ms porciones de frutas y Set designer. ? Limitar la cantidad de carne, carne de ave, pescado y [employer] consume.  Concurra a todas las visitas de control como se lo haya indicado el mdico. Esto es importante.  Hgase las radiografas segn las indicaciones de su mdico.  SOLICITE AYUDA SI: Siente un dolor que empeora an tomando analgsicos. SOLICITE AYUDA DE INMEDIATO SI:  El dolor no mejora con los medicamentos recetados.  Siente escalofros o fiebre.  El dolor aumenta y Ambulance person en las siguientes 18 horas.  Siente un nuevo dolor en el vientre (abdominal).  Sufre mareos o se desmaya.  Nota que no puede orinar.  Esta informacin no tiene Marine scientist el consejo del mdico. Asegrese de hacerle al mdico cualquier pregunta que tenga. Document Released: 07/01/2008 Document Revised: 12/24/2014 Document Reviewed: 09/18/2015 Elsevier Interactive Patient Education  2017 [employer].

## 2017-04-04 NOTE — Telephone Encounter (Signed)
Pt states she is feeling better and still taking antibiotics.

## 2017-04-04 NOTE — Progress Notes (Signed)
[facility] 46 y.o.   Chief Complaint  Patient presents with  . Back Pain    x 5 days lower kidneys     HISTORY OF PRESENT ILLNESS: [name] complaining of right sided flank pain x 5 days; has h/o kidney stones and had infection once. Denies trauma or dysuria; has been taking Amoxil x throat infection; started 7 days ago.  HPI   Prior to Admission medications   Medication Sig Start Date End Date Taking? Authorizing Provider  amoxicillin (AMOXIL) 500 MG tablet Take 2 tablets (1,000 mg total) by mouth 2 (two) times daily. 03/29/17  Yes Wardell Honour, MD  triamcinolone cream (KENALOG) 0.1 % Apply 1 application topically 2 (two) times daily. 02/13/17  Yes Velna Ochs, MD  Vitamin D, Ergocalciferol, (DRISDOL) 50000 units CAPS capsule Take 1 capsule (50,000 Units total) by mouth every 7 (seven) days. 02/03/17  Yes Princess Bruins, MD  gentamicin cream (GARAMYCIN) 0.1 % Apply 1 application 3 (three) times daily topically. Patient not taking: Reported on 04/04/2017 02/22/17   Edrick Kins, DPM  medroxyPROGESTERone (PROVERA) 5 MG tablet Take 1 tablet (5 mg total) by mouth daily. Every 3 months if no spontaneous period. 02/03/17 02/13/17  Princess Bruins, MD    Allergies  Allergen Reactions  . Latex Itching and Rash    Itching with use of condoms    Patient Active Problem List   Diagnosis Date Noted  . Rash 02/13/2017  . Sore throat 10/27/2016  . Chest pain 05/12/2016  . Intramural leiomyoma of uterus 02/29/2016  . Numbness and tingling 02/15/2016  . GERD (gastroesophageal reflux disease) 10/19/2015  . Acute upper back pain 07/10/2015  . Dysuria 06/11/2015  . Allergic rhinitis 06/10/2015  . History of pulmonary embolus (PE) 02/24/2015  . Sciatic pain 11/24/2014  . Hx of pulmonary embolus during pregnancy 11/09/2014  . Hx of gestational diabetes mellitus, not currently pregnant     Past Medical History:  Diagnosis Date  . Abnormal Pap  smear    patient states she had cancer that was removed from her cervix  . Asthma    when pregnant/ once 6 yrs ago only time asthma attack the dr said  . Cancer ([place])   . Clotting disorder ([place])   . Gestational diabetes   . Intramural leiomyoma of uterus 02/29/2016  . Pulmonary embolism ([place])   . Renal insufficiency   . Vitamin D deficiency   . Vitamin D deficiency     Past Surgical History:  Procedure Laterality Date  . CHOLECYSTECTOMY      Social History   Socioeconomic History  . Marital status: Married    Spouse name: Not on file  . Number of children: Not on file  . Years of education: Not on file  . Highest education level: Not on file  Social Needs  . Financial resource strain: Not on file  . Food insecurity - worry: Not on file  . Food insecurity - inability: Not on file  . Transportation needs - medical: Not on file  . Transportation needs - non-medical: Not on file  Occupational History  . Not on file  Tobacco Use  . Smoking status: Never Smoker  . Smokeless tobacco: Never Used  Substance and Sexual Activity  . Alcohol use: Yes    Alcohol/week: 0.0 oz    Comment: Occasionally.  . Drug use: No  . Sexual activity: Yes    Partners: Male    Birth control/protection:  Condom  Other Topics Concern  . Not on file  Social History Narrative   Married with four children. Came to [place] from [place] in 2001. Unemployed currently. Spanish speaking.    Family History  Problem Relation Age of Onset  . Diabetes Mother   . Hypertension Mother   . Hyperlipidemia Father   . Diabetes Father   . Hypertension Father   . Heart disease Sister   . Diabetes Brother   . Hyperlipidemia Brother   . Drug abuse Paternal Grandmother   . Kidney disease Other   . Birth defects Other        anal atresia/stenosis     Review of Systems  Constitutional: Negative.  Negative for chills and fever.  HENT: Negative.   Eyes: Negative.   Respiratory: Negative.  Negative for  cough and shortness of breath.   Cardiovascular: Negative.  Negative for chest pain and palpitations.  Gastrointestinal: Negative for abdominal pain, nausea and vomiting.  Genitourinary: Positive for flank pain. Negative for dysuria.  Musculoskeletal: Positive for back pain.  Skin: Negative.  Negative for rash.  Neurological: Negative.   Endo/Heme/Allergies: Negative.   All other systems reviewed and are negative.    Vitals:   04/04/17 1113  BP: 110/68  Pulse: 78  Resp: 16  Temp: 98.2 F (36.8 C)  SpO2: 99%     Physical Exam  Constitutional: She is oriented to person, place, and time. She appears well-developed and well-nourished.  HENT:  Head: Normocephalic and atraumatic.  Nose: Nose normal.  Mouth/Throat: Oropharynx is clear and moist.  Eyes: Conjunctivae and EOM are normal. Pupils are equal, round, and reactive to light.  Neck: Normal range of motion. Neck supple.  Cardiovascular: Normal rate, regular rhythm and normal heart sounds.  Pulmonary/Chest: Effort normal and breath sounds normal.  Abdominal: Soft. She exhibits no distension. There is no tenderness.  Musculoskeletal: Normal range of motion.  Lymphadenopathy:    She has no cervical adenopathy.  Neurological: She is alert and oriented to person, place, and time. No sensory deficit. She exhibits normal muscle tone.  Skin: Skin is warm and dry. Capillary refill takes less than 2 seconds. No rash noted.  Psychiatric: She has a normal mood and affect. Her behavior is normal.  Vitals reviewed.   Results for orders placed or performed in visit on 04/04/17 (from the past 24 hour(s))  POCT urinalysis dipstick     Status: Abnormal   Collection Time: 04/04/17 11:44 AM  Result Value Ref Range   Color, UA yellow yellow   Clarity, UA clear clear   Glucose, UA negative negative mg/dL   Bilirubin, UA negative negative   Ketones, POC UA negative negative mg/dL   Spec Grav, UA 1.025 1.010 - 1.025   Blood, UA small (A)  negative   pH, UA 5.5 5.0 - 8.0   Protein Ur, POC negative negative mg/dL   Urobilinogen, UA 0.2 0.2 or 1.0 E.U./dL   Nitrite, UA Negative Negative   Leukocytes, UA Negative Negative    ASSESSMENT & PLAN: Krisann was seen today for back pain.  Diagnoses and all orders for this visit:  Flank pain -     Urine Culture -     POCT urinalysis dipstick -     ketorolac (TORADOL) injection 60 mg -     traMADol (ULTRAM) 50 MG tablet; Take 1 tablet (50 mg total) by mouth every 8 (eight) hours as needed.  History of kidney stones -     Urine  Culture -     POCT urinalysis dipstick -     traMADol (ULTRAM) 50 MG tablet; Take 1 tablet (50 mg total) by mouth every 8 (eight) hours as needed.    Patient Instructions       IF you received an x-ray today, you will receive an invoice from [facility]. Please contact [facility] at [phone] with questions or concerns regarding your invoice.   IF you received labwork today, you will receive an invoice from [place]. Please contact [facility] at [phone] with questions or concerns regarding your invoice.   Our billing staff will not be able to assist you with questions regarding bills from these companies.  You will be contacted with the lab results as soon as they are available. The fastest way to get your results is to activate your My Chart account. Instructions are located on the last page of this paperwork. If you have not heard from [place] regarding the results in 2 weeks, please contact this office.     Clculos renales (Kidney Stones) Los clculos renales (urolitiasis) son masas slidas que se forman en el interior de los riones. El dolor intenso es causado por el movimiento de la piedra a travs del rin, urter, vejiga y Barista (tracto urinario). Cuando la piedra se mueve, el urter hace un espasmo alrededor de la misma. El clculo generalmente se elimina con el pis (la [place]). CUIDADOS EN EL HOGAR  Debe ingerir  gran cantidad de lquido para mantener la orina de tono claro o color amarillo plido. Esto ayudar a eliminar la piedra.  Recoja una muestra de orina durante 24 horas como se lo haya indicado el mdico. Tal vez tenga que recoger otra muestra cada 6 o 12 meses.  Cuele la orina con el colador que le han provisto. No orine de otra forma que no sea a travs del Administrator, ni siquiera una vez. Si elimina la piedra, se retendr en el colador. Puede ser tan pequea como un grano de sal. Llvela a su visita con el mdico. Esto ayudar a que el mdico le indique qu puede hacer para tratar de prevenir la ocurrencia de nuevos clculos renales.  Slo tome los medicamentos que le haya indicado su mdico.  Haga cambios en la dieta diaria como se lo haya indicado el mdico. Es posible que le indiquen lo siguiente: ? Limitar la cantidad de sal que consume. ? Consumir 5 o ms porciones de frutas y Set designer. ? Limitar la cantidad de carne, carne de ave, pescado y [employer] consume.  Concurra a todas las visitas de control como se lo haya indicado el mdico. Esto es importante.  Hgase las radiografas segn las indicaciones de su mdico.  SOLICITE AYUDA SI: Siente un dolor que empeora an tomando analgsicos. SOLICITE AYUDA DE INMEDIATO SI:  El dolor no mejora con los medicamentos recetados.  Siente escalofros o fiebre.  El dolor aumenta y Ambulance person en las siguientes 18 horas.  Siente un nuevo dolor en el vientre (abdominal).  Sufre mareos o se desmaya.  Nota que no puede orinar.  Esta informacin no tiene Marine scientist el consejo del mdico. Asegrese de hacerle al mdico cualquier pregunta que tenga. Document Released: 07/01/2008 Document Revised: 12/24/2014 Document Reviewed: 09/18/2015 Elsevier Interactive Patient Education  2017 Elsevier Inc.      Agustina Caroli, MD [facility]

## 2017-04-05 LAB — URINE CULTURE: Organism ID, Bacteria: NO GROWTH

## 2017-04-14 ENCOUNTER — Encounter: Payer: Self-pay | Admitting: Emergency Medicine

## 2017-04-14 ENCOUNTER — Ambulatory Visit: Payer: BLUE CROSS/BLUE SHIELD | Admitting: Emergency Medicine

## 2017-04-14 VITALS — BP 130/80 | HR 73 | Temp 98.2°F | Resp 17 | Ht 66.0 in | Wt 198.0 lb

## 2017-04-14 DIAGNOSIS — G44201 Tension-type headache, unspecified, intractable: Secondary | ICD-10-CM

## 2017-04-14 MED ORDER — KETOROLAC TROMETHAMINE 60 MG/2ML IM SOLN
60.0000 mg | Freq: Once | INTRAMUSCULAR | Status: AC
  Administered 2017-04-14: 60 mg via INTRAMUSCULAR

## 2017-04-14 MED ORDER — TRAMADOL HCL 50 MG PO TABS: 50.0000 mg | ORAL_TABLET | Freq: Three times a day (TID) | ORAL | 0 refills | Status: DC | PRN

## 2017-04-14 NOTE — Patient Instructions (Addendum)
     IF you received an x-ray today, you will receive an invoice from [facility]. Please contact [facility] at [phone] with questions or concerns regarding your invoice.   IF you received labwork today, you will receive an invoice from [place]. Please contact [facility] at [phone] with questions or concerns regarding your invoice.   Our billing staff will not be able to assist you with questions regarding bills from these companies.  You will be contacted with the lab results as soon as they are available. The fastest way to get your results is to activate your My Chart account. Instructions are located on the last page of this paperwork. If you have not heard from [place] regarding the results in 2 weeks, please contact this office.      Cefalea tensional (Tension Headache) Una cefalea tensional es un dolor o presin que se siente en la frente y los lados de la cabeza. Estas cefaleas pueden durar de 78minutos a varios das. CUIDADOS EN EL HOGAR Control del [employer] de venta libre y los recetados solamente como se lo haya indicado el mdico.  Cuando sienta dolor de cabeza acustese en un cuarto oscuro y tranquilo.  Si se lo indican, aplquese hielo en la zona de la cabeza y el cuello: ? Ponga el hielo en una bolsa plstica. ? Coloque una [employer] piel y la bolsa de hielo. ? Coloque el hielo durante 36minutos, 2 a 3veces por da.  Utilice una almohadilla trmica o tome una ducha caliente para aplicar calor en la zona de la cabeza y el cuello segn las indicaciones del mdico. Comida y bebida  Mantenga un horario para las comidas.  No beba mucho alcohol.  No consuma mucha cafena ni deje de consumirla por completo. Instrucciones generales  Concurra a todas las visitas de control como se lo haya indicado el mdico. Esto es importante.  Lleve un registro diario para Neurosurgeon si ciertas cosas provocan los dolores de [place].  Por ejemplo, escriba los siguientes datos: ? Lo que usted come y bebe. ? Cunto tiempo duerme. ? Algn cambio en su dieta o en los medicamentos.  Pruebe recibir Engineer, production o hacer otras cosas que lo ayuden a Nurse, children's.  Disminuya el nivel de estrs.  Sintese con la espalda recta. No contraiga (tensione) los msculos.  No consuma productos que contengan tabaco. Estos incluyen cigarrillos, tabaco para mascar y Psychologist, sport and exercise. Si necesita ayuda para dejar de fumar, consulte al mdico.  Haga ejercicios con regularidad tal como se lo indic el mdico.  Duerma lo suficiente. Es Software engineer, entre 7 y 9horas de sueo. SOLICITE AYUDA SI:  Los medicamentos no logran [employer].  Tiene un dolor de cabeza que es diferente del dolor de cabeza habitual.  Tiene Tree surgeon estomacal (nuseas) o vomita.  Tiene fiebre. SOLICITE AYUDA DE INMEDIATO SI:  La cefalea es muy intensa.  Sigue vomitando.  Presenta rigidez en el cuello.  Tiene dificultad para ver.  Tiene dificultad para hablar.  Siente dolor en el ojo o en el odo.  Sus msculos estn dbiles, o pierde el control muscular.  Pierde el equilibrio o tiene problemas para Writer.  Siente que se desvanece (pierde el conocimiento) o se desmaya.  Se siente confundido. Esta informacin no tiene Marine scientist el consejo del mdico. Asegrese de hacerle al mdico cualquier pregunta que tenga. Document Released: 06/27/2011 Document Revised: 12/24/2014 Document Reviewed: 07/28/2014 Elsevier Interactive Patient Education  [employer].

## 2017-04-14 NOTE — Progress Notes (Signed)
[name] 46 y.o.   Chief Complaint  Patient presents with  . Headache    HISTORY OF PRESENT ILLNESS: [name] complaining of 2-3 day h/o persistent headache that started while exerting herself at home; no LOC, n/v, visual problems.  Headache   This is a new problem. The current episode started in the past 7 days. The problem occurs constantly. The pain is located in the bilateral region. The pain does not radiate. The pain quality is similar to prior headaches. The quality of the pain is described as aching and throbbing. The pain is at a severity of 6/10. The pain is moderate. Pertinent negatives include no abdominal pain, abnormal behavior, blurred vision, coughing, dizziness, ear pain, eye pain, eye redness, eye watering, fever, muscle aches, nausea, neck pain, numbness, scalp tenderness, sinus pressure, sore throat, swollen glands, tingling, visual change, vomiting or weakness.     Prior to Admission medications   Medication Sig Start Date End Date Taking? Authorizing Provider  gentamicin cream (GARAMYCIN) 0.1 % Apply 1 application 3 (three) times daily topically. 02/22/17  Yes Edrick Kins, DPM  traMADol (ULTRAM) 50 MG tablet Take 1 tablet (50 mg total) by mouth every 8 (eight) hours as needed. 04/04/17  Yes Toy Samarin, Ines Bloomer, MD  triamcinolone cream (KENALOG) 0.1 % Apply 1 application topically 2 (two) times daily. 02/13/17  Yes Velna Ochs, MD  Vitamin D, Ergocalciferol, (DRISDOL) 50000 units CAPS capsule Take 1 capsule (50,000 Units total) by mouth every 7 (seven) days. 02/03/17  Yes Princess Bruins, MD  medroxyPROGESTERone (PROVERA) 5 MG tablet Take 1 tablet (5 mg total) by mouth daily. Every 3 months if no spontaneous period. 02/03/17 02/13/17  Princess Bruins, MD    Allergies  Allergen Reactions  . Latex Itching and Rash    Itching with use of condoms    Patient Active Problem List   Diagnosis Date Noted  . Rash 02/13/2017    . Intramural leiomyoma of uterus 02/29/2016  . GERD (gastroesophageal reflux disease) 10/19/2015  . Acute upper back pain 07/10/2015  . History of pulmonary embolus (PE) 02/24/2015  . Hx of pulmonary embolus during pregnancy 11/09/2014    Past Medical History:  Diagnosis Date  . Abnormal Pap smear    patient states she had cancer that was removed from her cervix  . Asthma    when pregnant/ once 6 yrs ago only time asthma attack the dr said  . Cancer ([place])   . Clotting disorder ([place])   . Gestational diabetes   . Intramural leiomyoma of uterus 02/29/2016  . Pulmonary embolism ([place])   . Renal insufficiency   . Vitamin D deficiency   . Vitamin D deficiency     Past Surgical History:  Procedure Laterality Date  . CHOLECYSTECTOMY      Social History   Socioeconomic History  . Marital status: Married    Spouse name: Not on file  . Number of children: Not on file  . Years of education: Not on file  . Highest education level: Not on file  Social Needs  . Financial resource strain: Not on file  . Food insecurity - worry: Not on file  . Food insecurity - inability: Not on file  . Transportation needs - medical: Not on file  . Transportation needs - non-medical: Not on file  Occupational History  . Not on file  Tobacco Use  . Smoking status: Never Smoker  . Smokeless tobacco: Never Used  Substance and Sexual Activity  . Alcohol use: Yes    Alcohol/week: 0.0 oz    Comment: Occasionally.  . Drug use: No  . Sexual activity: Yes    Partners: Male    Birth control/protection: Condom  Other Topics Concern  . Not on file  Social History Narrative   Married with four children. Came to [place] from [place] in 2001. Unemployed currently. Spanish speaking.    Family History  Problem Relation Age of Onset  . Diabetes Mother   . Hypertension Mother   . Hyperlipidemia Father   . Diabetes Father   . Hypertension Father   . Heart disease Sister   . Diabetes Brother   .  Hyperlipidemia Brother   . Drug abuse Paternal Grandmother   . Kidney disease Other   . Birth defects Other        anal atresia/stenosis     Review of Systems  Constitutional: Negative for chills and fever.  HENT: Negative for congestion, ear pain, nosebleeds, sinus pressure and sore throat.   Eyes: Negative for blurred vision, pain and redness.  Respiratory: Negative for cough and shortness of breath.   Cardiovascular: Negative for chest pain and palpitations.  Gastrointestinal: Negative for abdominal pain, diarrhea, nausea and vomiting.  Genitourinary: Negative.  Negative for dysuria and hematuria.  Musculoskeletal: Negative for neck pain.  Skin: Negative for rash.  Neurological: Positive for headaches. Negative for dizziness, tingling, sensory change, focal weakness, weakness and numbness.  Endo/Heme/Allergies: Negative.   All other systems reviewed and are negative.   Vitals:   04/14/17 1208  BP: 130/80  Pulse: 73  Resp: 17  Temp: 98.2 F (36.8 C)  SpO2: 98%    Physical Exam  Constitutional: She is oriented to person, place, and time. She appears well-developed and well-nourished.  HENT:  Head: Normocephalic and atraumatic.  Nose: Nose normal.  Mouth/Throat: Oropharynx is clear and moist.  Eyes: Conjunctivae and EOM are normal. Pupils are equal, round, and reactive to light.  Neck: Normal range of motion. Neck supple. No JVD present. No thyromegaly present.  Cardiovascular: Normal rate, regular rhythm and normal heart sounds.  Pulmonary/Chest: Effort normal and breath sounds normal. No respiratory distress.  Abdominal: Soft. She exhibits no distension. There is no tenderness.  Musculoskeletal: Normal range of motion.  Lymphadenopathy:    She has no cervical adenopathy.  Neurological: She is alert and oriented to person, place, and time. She displays normal reflexes. No cranial nerve deficit or sensory deficit. She exhibits normal muscle tone. Coordination normal.    Skin: Skin is warm and dry. Capillary refill takes less than 2 seconds. No rash noted.  Psychiatric: She has a normal mood and affect. Her behavior is normal.  Vitals reviewed.  A total of 25 minutes was spent in the room with the patient, greater than 50% of which was in counseling/coordination of care.   ASSESSMENT & PLAN: Kim [name] was seen today for headache.  Diagnoses and all orders for this visit:  Acute intractable tension-type headache -     ketorolac (TORADOL) injection 60 mg -     traMADol (ULTRAM) 50 MG tablet; Take 1 tablet (50 mg total) by mouth every 8 (eight) hours as needed.    Patient Instructions       IF you received an x-ray today, you will receive an invoice from [facility] Rad[facility]. Please contact [facility] Radiology at [phone] with questions or concerns regarding your invoice.   IF you received labwork today, you will receive an  invoice from [place]. Please contact [facility] at [phone] with questions or concerns regarding your invoice.   Our billing staff will not be able to assist you with questions regarding bills from these companies.  You will be contacted with the lab results as soon as they are available. The fastest way to get your results is to activate your My Chart account. Instructions are located on the last page of this paperwork. If you have not heard from [place] regarding the results in 2 weeks, please contact this office.      Cefalea tensional (Tension Headache) Una cefalea tensional es un dolor o presin que se siente en la frente y los lados de la cabeza. Estas cefaleas pueden durar de 10minutos a varios das. CUIDADOS EN EL HOGAR Control del [employer] de venta libre y los recetados solamente como se lo haya indicado el mdico.  Cuando sienta dolor de cabeza acustese en un cuarto oscuro y tranquilo.  Si se lo indican, aplquese hielo en la zona de la cabeza y el cuello: ? Ponga el hielo en una bolsa  plstica. ? Coloque una [employer] piel y la bolsa de hielo. ? Coloque el hielo durante 84minutos, 2 a 3veces por da.  Utilice una almohadilla trmica o tome una ducha caliente para aplicar calor en la zona de la cabeza y el cuello segn las indicaciones del mdico. Comida y bebida  Mantenga un horario para las comidas.  No beba mucho alcohol.  No consuma mucha cafena ni deje de consumirla por completo. Instrucciones generales  Concurra a todas las visitas de control como se lo haya indicado el mdico. Esto es importante.  Lleve un registro diario para Neurosurgeon si ciertas cosas provocan los dolores de [place]. Por ejemplo, escriba los siguientes datos: ? Lo que usted come y bebe. ? Cunto tiempo duerme. ? Algn cambio en su dieta o en los medicamentos.  Pruebe recibir Engineer, production o hacer otras cosas que lo ayuden a Nurse, children's.  Disminuya el nivel de estrs.  Sintese con la espalda recta. No contraiga (tensione) los msculos.  No consuma productos que contengan tabaco. Estos incluyen cigarrillos, tabaco para mascar y Psychologist, sport and exercise. Si necesita ayuda para dejar de fumar, consulte al mdico.  Haga ejercicios con regularidad tal como se lo indic el mdico.  Duerma lo suficiente. Es Software engineer, entre 7 y 9horas de sueo. SOLICITE AYUDA SI:  Los medicamentos no logran [employer].  Tiene un dolor de cabeza que es diferente del dolor de cabeza habitual.  Tiene Tree surgeon estomacal (nuseas) o vomita.  Tiene fiebre. SOLICITE AYUDA DE INMEDIATO SI:  La cefalea es muy intensa.  Sigue vomitando.  Presenta rigidez en el cuello.  Tiene dificultad para ver.  Tiene dificultad para hablar.  Siente dolor en el ojo o en el odo.  Sus msculos estn dbiles, o pierde el control muscular.  Pierde el equilibrio o tiene problemas para Writer.  Siente que se desvanece (pierde el conocimiento) o se desmaya.  Se siente confundido. Esta informacin no tiene  Marine scientist el consejo del mdico. Asegrese de hacerle al mdico cualquier pregunta que tenga. Document Released: 06/27/2011 Document Revised: 12/24/2014 Document Reviewed: 07/28/2014 Elsevier Interactive Patient Education  2018 [employer].      Agustina Caroli, MD [facility]

## 2017-05-17 ENCOUNTER — Other Ambulatory Visit: Payer: Self-pay

## 2017-05-17 ENCOUNTER — Ambulatory Visit: Payer: BLUE CROSS/BLUE SHIELD | Admitting: Emergency Medicine

## 2017-05-17 ENCOUNTER — Encounter: Payer: Self-pay | Admitting: Emergency Medicine

## 2017-05-17 VITALS — BP 104/71 | HR 80 | Temp 98.0°F | Resp 16 | Wt 197.0 lb

## 2017-05-17 DIAGNOSIS — J029 Acute pharyngitis, unspecified: Secondary | ICD-10-CM

## 2017-05-17 DIAGNOSIS — J040 Acute laryngitis: Secondary | ICD-10-CM | POA: Diagnosis not present

## 2017-05-17 MED ORDER — AZITHROMYCIN 250 MG PO TABS: ORAL_TABLET | ORAL | 0 refills | Status: DC

## 2017-05-17 MED ORDER — PREDNISONE 20 MG PO TABS: 40.0000 mg | ORAL_TABLET | Freq: Every day | ORAL | 0 refills | Status: AC

## 2017-05-17 NOTE — Patient Instructions (Addendum)
     IF you received an x-ray today, you will receive an invoice from [facility] Radiology. Please contact [facility] Radiology at [phone] with questions or concerns regarding your invoice.   IF you received labwork today, you will receive an invoice from [place]. Please contact [facility] at [phone] with questions or concerns regarding your invoice.   Our billing staff will not be able to assist you with questions regarding bills from these companies.  You will be contacted with the lab results as soon as they are available. The fastest way to get your results is to activate your My Chart account. Instructions are located on the last page of this paperwork. If you have not heard from [place] regarding the results in 2 weeks, please contact this office.     Laringitis (Laryngitis) La laringitis es la hinchazn (inflamacin) de las cuerdas vocales. Esto provoca ronquera, tos, prdida de la voz, dolor de garganta o sequedad en la garganta. Cuando las cuerdas vocales se inflaman, la voz suena diferente. La laringitis puede ser temporal (aguda) o de larga duracin (crnica). La [employer] de los casos de laringitis aguda mejoran con [place]. La laringitis crnica es la laringitis que dura ms de tres semanas. CUIDADOS EN EL HOGAR  Beba suficiente lquido para mantener el pis (orina) claro o de color amarillo plido.  Inhale aire hmedo. Use un humidificador si vive en un clima seco.  Tome los medicamentos solamente como se lo haya indicado el mdico.  No fume cigarrillos ni cigarrillos electrnicos. Si necesita ayuda para dejar de fumar, consulte al mdico.  Hable lo menos posible. Evite tambin susurrar ya que puede provocar esfuerzo vocal.  Alla German en vez de hablar. Hgalo hasta que su voz vuelva a la normalidad. SOLICITE AYUDA SI:  Tiene fiebre.  El dolor [place].  Presenta dificultad para tragar. SOLICITE AYUDA DE INMEDIATO SI:  Tose y escupe sangre.  Tiene dificultad para  respirar. Esta informacin no tiene Marine scientist el consejo del mdico. Asegrese de hacerle al mdico cualquier pregunta que tenga. Document Released: 03/24/2011 Document Revised: 04/25/2014 Document Reviewed: 09/17/2013 Elsevier Interactive Patient Education  [employer].

## 2017-05-17 NOTE — Progress Notes (Signed)
[facility] 47 y.o.   Chief Complaint  Patient presents with  . Sore Throat    x 2 days  . Shortness of Breath    HISTORY OF PRESENT ILLNESS: [name] complaining of URI symptoms for 2 weeks; today mostly complaining of a burning throat sensation with dry cough and intermittent air hunger sensation.  Also complaining of loss of voice.  Denies any other significant symptomatology.  Sore Throat   This is a new problem. The current episode started 1 to 4 weeks ago. The problem has been gradually worsening. There has been no fever. The pain is moderate. Associated symptoms include congestion, coughing, a hoarse voice, shortness of breath and swollen glands. Pertinent negatives include no abdominal pain, diarrhea, ear pain, headaches, trouble swallowing or vomiting.     Prior to Admission medications   Medication Sig Start Date End Date Taking? Authorizing Provider  traMADol (ULTRAM) 50 MG tablet Take 1 tablet (50 mg total) by mouth every 8 (eight) hours as needed. 04/14/17  Yes Jamar Casagrande, Ines Bloomer, MD  Vitamin D, Ergocalciferol, (DRISDOL) 50000 units CAPS capsule Take 1 capsule (50,000 Units total) by mouth every 7 (seven) days. 02/03/17  Yes Princess Bruins, MD  gentamicin cream (GARAMYCIN) 0.1 % Apply 1 application 3 (three) times daily topically. Patient not taking: Reported on 05/17/2017 02/22/17   Edrick Kins, DPM  medroxyPROGESTERone (PROVERA) 5 MG tablet Take 1 tablet (5 mg total) by mouth daily. Every 3 months if no spontaneous period. 02/03/17 02/13/17  Princess Bruins, MD  triamcinolone cream (KENALOG) 0.1 % Apply 1 application topically 2 (two) times daily. 02/13/17   Velna Ochs, MD    Allergies  Allergen Reactions  . Latex Itching and Rash    Itching with use of condoms    Patient Active Problem List   Diagnosis Date Noted  . Intramural leiomyoma of uterus 02/29/2016  . GERD (gastroesophageal reflux disease) 10/19/2015  .  History of pulmonary embolus (PE) 02/24/2015  . Hx of pulmonary embolus during pregnancy 11/09/2014    Past Medical History:  Diagnosis Date  . Abnormal Pap smear    patient states she had cancer that was removed from her cervix  . Asthma    when pregnant/ once 6 yrs ago only time asthma attack the dr said  . Cancer ([place])   . Clotting disorder ([place])   . Gestational diabetes   . Intramural leiomyoma of uterus 02/29/2016  . Pulmonary embolism ([place])   . Renal insufficiency   . Vitamin D deficiency   . Vitamin D deficiency     Past Surgical History:  Procedure Laterality Date  . CHOLECYSTECTOMY      Social History   Socioeconomic History  . Marital status: Married    Spouse name: Not on file  . Number of children: Not on file  . Years of education: Not on file  . Highest education level: Not on file  Social Needs  . Financial resource strain: Not on file  . Food insecurity - worry: Not on file  . Food insecurity - inability: Not on file  . Transportation needs - medical: Not on file  . Transportation needs - non-medical: Not on file  Occupational History  . Not on file  Tobacco Use  . Smoking status: Never Smoker  . Smokeless tobacco: Never Used  Substance and Sexual Activity  . Alcohol use: Yes    Alcohol/week: 0.0 oz    Comment: Occasionally.  Marland Kitchen  Drug use: No  . Sexual activity: Yes    Partners: Male    Birth control/protection: Condom  Other Topics Concern  . Not on file  Social History Narrative   Married with four children. Came to [place] from [place] in 2001. Unemployed currently. Spanish speaking.    Family History  Problem Relation Age of Onset  . Diabetes Mother   . Hypertension Mother   . Hyperlipidemia Father   . Diabetes Father   . Hypertension Father   . Heart disease Sister   . Diabetes Brother   . Hyperlipidemia Brother   . Drug abuse Paternal Grandmother   . Kidney disease Other   . Birth defects Other        anal atresia/stenosis       Review of Systems  Constitutional: Negative.  Negative for chills and fever.  HENT: Positive for congestion, hoarse voice and sore throat. Negative for ear pain and trouble swallowing.   Eyes: Negative.  Negative for discharge and redness.  Respiratory: Positive for cough and shortness of breath. Negative for hemoptysis and wheezing.   Cardiovascular: Negative.  Negative for chest pain and palpitations.  Gastrointestinal: Negative.  Negative for abdominal pain, diarrhea, nausea and vomiting.  Genitourinary: Negative.  Negative for dysuria and hematuria.  Musculoskeletal: Negative for back pain and myalgias.  Skin: Negative.  Negative for rash.  Neurological: Negative.  Negative for dizziness and headaches.  Endo/Heme/Allergies: Negative.   All other systems reviewed and are negative.  Vitals:   05/17/17 1138  BP: 104/71  Pulse: 80  Resp: 16  Temp: 98 F (36.7 C)  SpO2: 98%     Physical Exam  Constitutional: She is oriented to person, place, and time. She appears well-developed and well-nourished.  HENT:  Head: Normocephalic and atraumatic.  Right Ear: External ear normal.  Left Ear: External ear normal.  Mouth/Throat: Uvula is midline. No oral lesions. No uvula swelling. Oropharyngeal exudate, posterior oropharyngeal edema and posterior oropharyngeal erythema present. No tonsillar abscesses. Tonsils are 2+ on the right. Tonsils are 2+ on the left. Tonsillar exudate (left).  Eyes: Conjunctivae and EOM are normal. Pupils are equal, round, and reactive to light.  Neck: Normal range of motion. Neck supple. No thyromegaly present.  Cardiovascular: Normal rate, regular rhythm and normal heart sounds.  Pulmonary/Chest: Effort normal and breath sounds normal. No respiratory distress. She has no wheezes. She has no rales.  Abdominal: Soft. There is no tenderness.  Lymphadenopathy:    She has cervical adenopathy.  Neurological: She is alert and oriented to person, place, and  time. No sensory deficit. She exhibits normal muscle tone.  Skin: Skin is warm and dry. No rash noted.  Psychiatric: She has a normal mood and affect. Her behavior is normal.  Vitals reviewed.  A total of 25 minutes was spent in the room with the patient, greater than 50% of which was in counseling/coordination of care.   ASSESSMENT & PLAN: Kim [name] was seen today for sore throat and shortness of breath.  Diagnoses and all orders for this visit:  Sore throat  Acute laryngitis -     azithromycin (ZITHROMAX) 250 MG tablet; Sig as indicated -     predniSONE (DELTASONE) 20 MG tablet; Take 2 tablets (40 mg total) by mouth daily with breakfast for 5 days.    Patient Instructions       IF you received an x-ray today, you will receive an invoice from [facility] Radiology. Please contact [facility] Radiology at [phone] with questions  or concerns regarding your invoice.   IF you received labwork today, you will receive an invoice from [place]. Please contact [facility] at [phone] with questions or concerns regarding your invoice.   Our billing staff will not be able to assist you with questions regarding bills from these companies.  You will be contacted with the lab results as soon as they are available. The fastest way to get your results is to activate your My Chart account. Instructions are located on the last page of this paperwork. If you have not heard from [place] regarding the results in 2 weeks, please contact this office.     Laringitis (Laryngitis) La laringitis es la hinchazn (inflamacin) de las cuerdas vocales. Esto provoca ronquera, tos, prdida de la voz, dolor de garganta o sequedad en la garganta. Cuando las cuerdas vocales se inflaman, la voz suena diferente. La laringitis puede ser temporal (aguda) o de larga duracin (crnica). La [employer] de los casos de laringitis aguda mejoran con [place]. La laringitis crnica es la laringitis que dura ms de tres  semanas. CUIDADOS EN EL HOGAR  Beba suficiente lquido para mantener el pis (orina) claro o de color amarillo plido.  Inhale aire hmedo. Use un humidificador si vive en un clima seco.  Tome los medicamentos solamente como se lo haya indicado el mdico.  No fume cigarrillos ni cigarrillos electrnicos. Si necesita ayuda para dejar de fumar, consulte al mdico.  Hable lo menos posible. Evite tambin susurrar ya que puede provocar esfuerzo vocal.  Alla German en vez de hablar. Hgalo hasta que su voz vuelva a la normalidad. SOLICITE AYUDA SI:  Tiene fiebre.  El dolor [place].  Presenta dificultad para tragar. SOLICITE AYUDA DE INMEDIATO SI:  Tose y escupe sangre.  Tiene dificultad para respirar. Esta informacin no tiene Marine scientist el consejo del mdico. Asegrese de hacerle al mdico cualquier pregunta que tenga. Document Released: 03/24/2011 Document Revised: 04/25/2014 Document Reviewed: 09/17/2013 Elsevier Interactive Patient Education  2018 Elsevier Inc.      Agustina Caroli, MD [facility]

## 2017-05-25 ENCOUNTER — Other Ambulatory Visit: Payer: BLUE CROSS/BLUE SHIELD

## 2017-05-25 DIAGNOSIS — R7989 Other specified abnormal findings of blood chemistry: Secondary | ICD-10-CM

## 2017-05-28 ENCOUNTER — Other Ambulatory Visit: Payer: Self-pay | Admitting: Obstetrics & Gynecology

## 2017-05-28 LAB — VITAMIN D 1,25 DIHYDROXY
Vitamin D 1, 25 (OH)2 Total: <8 pg/mL — ABNORMAL LOW (ref 18–72)
Vitamin D2 1, 25 (OH)2: <8 pg/mL
Vitamin D3 1, 25 (OH)2: <8 pg/mL

## 2017-05-30 ENCOUNTER — Ambulatory Visit (INDEPENDENT_AMBULATORY_CARE_PROVIDER_SITE_OTHER): Payer: BLUE CROSS/BLUE SHIELD | Admitting: Emergency Medicine

## 2017-05-30 ENCOUNTER — Other Ambulatory Visit: Payer: Self-pay

## 2017-05-30 ENCOUNTER — Encounter: Payer: Self-pay | Admitting: Emergency Medicine

## 2017-05-30 VITALS — BP 110/74

## 2017-05-30 DIAGNOSIS — L989 Disorder of the skin and subcutaneous tissue, unspecified: Secondary | ICD-10-CM

## 2017-05-30 NOTE — Patient Instructions (Addendum)
IF you received an x-ray today, you will receive an invoice from [facility] Radiology. Please contact [facility] Radiology at [phone] with questions or concerns regarding your invoice.   IF you received labwork today, you will receive an invoice from [place]. Please contact [facility] at [phone] with questions or concerns regarding your invoice.   Our billing staff will not be able to assist you with questions regarding bills from these companies.  You will be contacted with the lab results as soon as they are available. The fastest way to get your results is to activate your My Chart account. Instructions are located on the last page of this paperwork. If you have not heard from [place] regarding the results in 2 weeks, please contact this office.     Lunar (Mole) Un lunar es un crecimiento en la piel con color (pigmentado). Los lunares son muy frecuentes. Suelen ser inofensivos, pero algunos lunares pueden volverse cancerosos con el paso del [place]. CAUSAS Los lunares se producen cuando clulas pigmentadas de la piel crecen en grupos en lugar de extenderse en la piel como normalmente lo hacen. El motivo de este agrupamiento no se conoce. SNTOMAS Un lunar puede ser de la siguiente manera:  Ramond Craver o negro.  [place] o elevado.  Suave o rugoso. DIAGNSTICO Los lunares se diagnostican por medio de un examen de la piel. Si el mdico considera que un lunar puede ser [place], se extirpar Ardelia Mems parte para examinarlo. TRATAMIENTO No es Chartered loss adjuster un tratamiento salvo que el lunar sea canceroso. En caso de serlo, se extirpar. Si un lunar Conservation officer, historic buildings o no le agrada su aspecto, se lo pueden extirpar. INSTRUCCIONES PARA EL CUIDADO EN EL HOGAR  [employer], revise la piel para comprobar que no haya lunares nuevos o que los existentes no [place]. Esto es importante porque un cambio en un lunar puede significar que se ha vuelto canceroso. Compruebe si cambiaron las  siguientes caractersticas: ? Tamao. Busque lunares que tengan ms de pulgadas (0,64cm) de ancho (dimetro). ? Forma. Busque lunares que no sean redondos ni ovalados. ? Bordes. Busque lunares que no sean simtricos. ? Color. Tenga en cuenta que es normal que los lunares se oscurezcan durante el embarazo o si toma pldoras para la regulacin de la natalidad.  Cuando est al Alexis Goodell, use pantalla solar con FPS30 (factor de proteccin solar30) o ms alto. Vuelva a Estate agent cada 2 a 3 horas.  Si tiene muchos lunares, consulte a un mdico de Manufacturing engineer (dermatlogo) al menos una vez por ao. SOLICITE ATENCIN MDICA SI:  El tamao, la forma, los bordes o el color del lunar cambiaron.  El lunar o la piel que lo rodea le duelen, estn rojos o hinchados.  El lunar: ? Presenta ms de un color. ? [place]. ? Se vuelve escamoso, la piel se cae o supura lquido. ? Se vuelve plano o aparecen reas elevadas. ? Se vuelve duro o blando.  Aparece un lunar nuevo. Esta informacin no tiene Marine scientist el consejo del mdico. Asegrese de hacerle al mdico cualquier pregunta que tenga. Document Released: 01/12/2005 Document Revised: 12/28/2011 Document Reviewed: 01/23/2015 Elsevier Interactive Patient Education  2018 [place]  Mole A mole is a colored (pigmented) growth on the skin. Moles are very common. They are usually harmless, but some moles can become cancerous over time. What are the causes? Moles occur when pigmented skin cells grow together in clusters instead of spreading out  in the skin as they normally do. The reason why the skin cells grow together in clusters is not known. What are the signs or symptoms? A mole may be:  Owens [place]ark or black.  Flat or raised.  Smooth or wrinkled.  How is this diagnosed? A mole is diagnosed with a skin exam. If your health care provider thinks a mole may be cancerous, a piece of the mole will be removed  for testing. How is this treated? Treatment is not needed unless a mole is cancerous. If a mole is cancerous, it will be removed. If a mole is causing pain or you do not like the way it looks, you may choose to have it removed. Follow these instructions at home:  Every month, look for new moles and check your existing moles for changes. This is important because a change in a mole can mean that the mole has become cancerous. Look for changes in: ? Size. Look for moles that are more than  in (0.64 cm) wide (in diameter). ? Shape. Look for moles that are not round or oval. ? Borders. Look for moles that are not symmetrical. ? Color. Note that it is normal for moles to get darker during pregnancy or when you take birth control pills.  When you are outdoors, wear sunscreen with SPF 30 (sun protection factor 30) or higher. Reapply the sunscreen every 2-3 hours.  If you have a large number of moles, see a skin doctor (dermatologist) at least one time every year. Contact a health care provider if:  The size, shape, borders, or color of your mole change.  Your mole, or the skin near the mole, becomes painful, sore, red, or swollen.  Your mole: ? Develops more than one color. ? Itches or bleeds. ? Becomes scaly, sheds skin, or oozes fluid. ? Becomes flat or develops raised areas. ? Becomes hard or soft.  You develop a new mole. This information is not intended to replace advice given to you by your health care provider. Make sure you discuss any questions you have with your health care provider. Document Released: 12/28/2000 Document Revised: 09/16/2015 Document Reviewed: 01/23/2015 Elsevier Interactive Patient Education  [employer].

## 2017-05-30 NOTE — Progress Notes (Signed)
[facility] 47 y.o.   Chief Complaint  Patient presents with  . Nevus    x 4 days with pain on back    HISTORY OF PRESENT ILLNESS: [name] complaining of skin lesion to her back for the past 1-2 weeks.  It itches with slight pain.  No other significant symptoms.  HPI   Prior to Admission medications   Medication Sig Start Date End Date Taking? Authorizing Provider  azithromycin (ZITHROMAX) 250 MG tablet Sig as indicated Patient not taking: Reported on 05/30/2017 05/17/17   Horald Pollen, MD  gentamicin cream (GARAMYCIN) 0.1 % Apply 1 application 3 (three) times daily topically. Patient not taking: Reported on 05/17/2017 02/22/17   Edrick Kins, DPM  medroxyPROGESTERone (PROVERA) 5 MG tablet Take 1 tablet (5 mg total) by mouth daily. Every 3 months if no spontaneous period. 02/03/17 02/13/17  Princess Bruins, MD  traMADol (ULTRAM) 50 MG tablet Take 1 tablet (50 mg total) by mouth every 8 (eight) hours as needed. Patient not taking: Reported on 05/30/2017 04/14/17   Horald Pollen, MD  triamcinolone cream (KENALOG) 0.1 % Apply 1 application topically 2 (two) times daily. Patient not taking: Reported on 05/30/2017 02/13/17   Velna Ochs, MD  Vitamin D, Ergocalciferol, (DRISDOL) 50000 units CAPS capsule TAKE 1 CAPSULE (50,000 UNITS TOTAL) BY MOUTH EVERY 7 (SEVEN) DAYS. Patient not taking: Reported on 05/30/2017 05/29/17   Princess Bruins, MD    Allergies  Allergen Reactions  . Latex Itching and Rash    Itching with use of condoms    Patient Active Problem List   Diagnosis Date Noted  . Acute laryngitis 05/17/2017  . Sore throat 10/27/2016  . Intramural leiomyoma of uterus 02/29/2016  . GERD (gastroesophageal reflux disease) 10/19/2015  . History of pulmonary embolus (PE) 02/24/2015  . Hx of pulmonary embolus during pregnancy 11/09/2014    Past Medical History:  Diagnosis Date  . Abnormal Pap smear    patient states she had  cancer that was removed from her cervix  . Asthma    when pregnant/ once 6 yrs ago only time asthma attack the dr said  . Cancer ([place])   . Clotting disorder ([place])   . Gestational diabetes   . Intramural leiomyoma of uterus 02/29/2016  . Pulmonary embolism ([place])   . Renal insufficiency   . Vitamin D deficiency   . Vitamin D deficiency     Past Surgical History:  Procedure Laterality Date  . CHOLECYSTECTOMY      Social History   Socioeconomic History  . Marital status: Married    Spouse name: Not on file  . Number of children: Not on file  . Years of education: Not on file  . Highest education level: Not on file  Social Needs  . Financial resource strain: Not on file  . Food insecurity - worry: Not on file  . Food insecurity - inability: Not on file  . Transportation needs - medical: Not on file  . Transportation needs - non-medical: Not on file  Occupational History  . Not on file  Tobacco Use  . Smoking status: Never Smoker  . Smokeless tobacco: Never Used  Substance and Sexual Activity  . Alcohol use: Yes    Alcohol/week: 0.0 oz    Comment: Occasionally.  . Drug use: No  . Sexual activity: Yes    Partners: Male    Birth control/protection: Condom  Other Topics Concern  . Not on  file  Social History Narrative   Married with four children. Came to [place] from [place] in 2001. Unemployed currently. Spanish speaking.    Family History  Problem Relation Age of Onset  . Diabetes Mother   . Hypertension Mother   . Hyperlipidemia Father   . Diabetes Father   . Hypertension Father   . Heart disease Sister   . Diabetes Brother   . Hyperlipidemia Brother   . Drug abuse Paternal Grandmother   . Kidney disease Other   . Birth defects Other        anal atresia/stenosis     Review of Systems  Constitutional: Negative.  Negative for chills, fever and weight loss.  Gastrointestinal: Negative for nausea and vomiting.  Neurological: Negative for dizziness and  headaches.  Endo/Heme/Allergies: Negative.   All other systems reviewed and are negative.   Vitals:   05/30/17 1344  BP: 110/74    Physical Exam  Constitutional: She appears well-developed and well-nourished.  HENT:  Head: Normocephalic and atraumatic.  Eyes: Pupils are equal, round, and reactive to light.  Neck: Normal range of motion. Neck supple.  Cardiovascular: Normal rate and regular rhythm.  Pulmonary/Chest: Effort normal.  Musculoskeletal: Normal range of motion.  Skin: Capillary refill takes less than 2 seconds.  Back: Flat nontender hypopigmented lesion with irregular border to the left mid low back.  Psychiatric: She has a normal mood and affect. Her behavior is normal.  Vitals reviewed.    ASSESSMENT & PLAN: Xana was seen today for nevus.  Diagnoses and all orders for this visit:  Skin lesion of back -     Ambulatory referral to Dermatology    Patient Instructions       IF you received an x-ray today, you will receive an invoice from [facility] Radiology. Please contact [facility] Radiology at [phone] with questions or concerns regarding your invoice.   IF you received labwork today, you will receive an invoice from [place]. Please contact [facility] at [phone] with questions or concerns regarding your invoice.   Our billing staff will not be able to assist you with questions regarding bills from these companies.  You will be contacted with the lab results as soon as they are available. The fastest way to get your results is to activate your My Chart account. Instructions are located on the last page of this paperwork. If you have not heard from [place] regarding the results in 2 weeks, please contact this office.     Lunar (Mole) Un lunar es un crecimiento en la piel con color (pigmentado). Los lunares son muy frecuentes. Suelen ser inofensivos, pero algunos lunares pueden volverse cancerosos con el paso del [place]. CAUSAS Los lunares se  producen cuando clulas pigmentadas de la piel crecen en grupos en lugar de extenderse en la piel como normalmente lo hacen. El motivo de este agrupamiento no se conoce. SNTOMAS Un lunar puede ser de la siguiente manera:  Ramond Craver o negro.  [place] o elevado.  Suave o rugoso. DIAGNSTICO Los lunares se diagnostican por medio de un examen de la piel. Si el mdico considera que un lunar puede ser [place], se extirpar Ardelia Mems parte para examinarlo. TRATAMIENTO No es Chartered loss adjuster un tratamiento salvo que el lunar sea canceroso. En caso de serlo, se extirpar. Si un lunar Conservation officer, historic buildings o no le agrada su aspecto, se lo pueden extirpar. INSTRUCCIONES PARA EL CUIDADO EN EL HOGAR  [employer], revise la piel para comprobar que no haya lunares nuevos o  que los existentes no [place]. Esto es importante porque un cambio en un lunar puede significar que se ha vuelto canceroso. Compruebe si cambiaron las siguientes caractersticas: ? Tamao. Busque lunares que tengan ms de pulgadas (0,64cm) de ancho (dimetro). ? Forma. Busque lunares que no sean redondos ni ovalados. ? Bordes. Busque lunares que no sean simtricos. ? Color. Tenga en cuenta que es normal que los lunares se oscurezcan durante el embarazo o si toma pldoras para la regulacin de la natalidad.  Cuando est al Alexis Goodell, use pantalla solar con FPS30 (factor de proteccin solar30) o ms alto. Vuelva a Estate agent cada 2 a 3 horas.  Si tiene muchos lunares, consulte a un mdico de Manufacturing engineer (dermatlogo) al menos una vez por ao. SOLICITE ATENCIN MDICA SI:  El tamao, la forma, los bordes o el color del lunar cambiaron.  El lunar o la piel que lo rodea le duelen, estn rojos o hinchados.  El lunar: ? Presenta ms de un color. ? [place]. ? Se vuelve escamoso, la piel se cae o supura lquido. ? Se vuelve plano o aparecen reas elevadas. ? Se vuelve duro o blando.  Aparece un lunar  nuevo. Esta informacin no tiene Marine scientist el consejo del mdico. Asegrese de hacerle al mdico cualquier pregunta que tenga. Document Released: 01/12/2005 Document Revised: 12/28/2011 Document Reviewed: 01/23/2015 Elsevier Interactive Patient Education  2018 [place]  Mole A mole is a colored (pigmented) growth on the skin. Moles are very common. They are usually harmless, but some moles can become cancerous over time. What are the causes? Moles occur when pigmented skin cells grow together in clusters instead of spreading out in the skin as they normally do. The reason why the skin cells grow together in clusters is not known. What are the signs or symptoms? A mole may be:  Owens Shark or black.  Flat or raised.  Smooth or wrinkled.  How is this diagnosed? A mole is diagnosed with a skin exam. If your health care provider thinks a mole may be cancerous, a piece of the mole will be removed for testing. How is this treated? Treatment is not needed unless a mole is cancerous. If a mole is cancerous, it will be removed. If a mole is causing pain or you do not like the way it looks, you may choose to have it removed. Follow these instructions at home:  Every month, look for new moles and check your existing moles for changes. This is important because a change in a mole can mean that the mole has become cancerous. Look for changes in: ? Size. Look for moles that are more than  in (0.64 cm) wide (in diameter). ? Shape. Look for moles that are not round or oval. ? Borders. Look for moles that are not symmetrical. ? Color. Note that it is normal for moles to get darker during pregnancy or when you take birth control pills.  When you are outdoors, wear sunscreen with SPF 30 (sun protection factor 30) or higher. Reapply the sunscreen every 2-3 hours.  If you have a large number of moles, see a skin doctor (dermatologist) at least one time every year. Contact a health care  provider if:  The size, shape, borders, or color of your mole change.  Your mole, or the skin near the mole, becomes painful, sore, red, or swollen.  Your mole: ? Develops more than one color. ? Itches or bleeds. ? Becomes  scaly, sheds skin, or oozes fluid. ? Becomes flat or develops raised areas. ? Becomes hard or soft.  You develop a new mole. This information is not intended to replace advice given to you by your health care provider. Make sure you discuss any questions you have with your health care provider. Document Released: 12/28/2000 Document Revised: 09/16/2015 Document Reviewed: 01/23/2015 Elsevier Interactive Patient Education  2018 Elsevier Inc.      Agustina Caroli, MD [facility]

## 2017-05-31 ENCOUNTER — Other Ambulatory Visit: Payer: Self-pay | Admitting: Obstetrics & Gynecology

## 2017-05-31 DIAGNOSIS — D485 Neoplasm of uncertain behavior of skin: Secondary | ICD-10-CM | POA: Diagnosis not present

## 2017-05-31 DIAGNOSIS — Z1231 Encounter for screening mammogram for malignant neoplasm of breast: Secondary | ICD-10-CM

## 2017-06-05 ENCOUNTER — Other Ambulatory Visit: Payer: Self-pay | Admitting: Anesthesiology

## 2017-06-05 ENCOUNTER — Other Ambulatory Visit: Payer: Self-pay

## 2017-06-05 ENCOUNTER — Ambulatory Visit: Payer: BLUE CROSS/BLUE SHIELD | Admitting: Family Medicine

## 2017-06-05 ENCOUNTER — Encounter: Payer: Self-pay | Admitting: Family Medicine

## 2017-06-05 VITALS — BP 125/89 | HR 103 | Temp 99.8°F | Resp 17 | Ht 66.0 in | Wt 196.4 lb

## 2017-06-05 DIAGNOSIS — R05 Cough: Secondary | ICD-10-CM | POA: Diagnosis not present

## 2017-06-05 DIAGNOSIS — R059 Cough, unspecified: Secondary | ICD-10-CM

## 2017-06-05 DIAGNOSIS — J069 Acute upper respiratory infection, unspecified: Secondary | ICD-10-CM | POA: Diagnosis not present

## 2017-06-05 DIAGNOSIS — R6889 Other general symptoms and signs: Secondary | ICD-10-CM

## 2017-06-05 DIAGNOSIS — E559 Vitamin D deficiency, unspecified: Secondary | ICD-10-CM

## 2017-06-05 LAB — POCT INFLUENZA A/B
Influenza A, POC: NEGATIVE
Influenza B, POC: NEGATIVE

## 2017-06-05 MED ORDER — BENZONATATE 100 MG PO CAPS: 100.0000 mg | ORAL_CAPSULE | Freq: Two times a day (BID) | ORAL | 0 refills | Status: DC | PRN

## 2017-06-05 MED ORDER — FLUTICASONE PROPIONATE 50 MCG/ACT NA SUSP: 2.0000 | Freq: Every day | NASAL | 6 refills | Status: DC

## 2017-06-05 MED ORDER — VITAMIN D (ERGOCALCIFEROL) 1.25 MG (50000 UNIT) PO CAPS: 50000.0000 [IU] | ORAL_CAPSULE | ORAL | 0 refills | Status: DC

## 2017-06-05 MED ORDER — GUAIFENESIN ER 600 MG PO TB12
1200.0000 mg | ORAL_TABLET | Freq: Two times a day (BID) | ORAL | 0 refills | Status: DC
Start: 2017-06-05 — End: 2017-06-12

## 2017-06-05 NOTE — Patient Instructions (Addendum)
IF you received an x-ray today, you will receive an invoice from [facility] Radiology. Please contact [facility] Radiology at [phone] with questions or concerns regarding your invoice.   IF you received labwork today, you will receive an invoice from [place]. Please contact [facility] at [phone] with questions or concerns regarding your invoice.   Our billing staff will not be able to assist you with questions regarding bills from these companies.  You will be contacted with the lab results as soon as they are available. The fastest way to get your results is to activate your My Chart account. Instructions are located on the last page of this paperwork. If you have not heard from [place] regarding the results in 2 weeks, please contact this office.    We recommend that you schedule a mammogram for breast cancer screening. Typically, you do not need a referral to do this. Please contact a local imaging center to schedule your mammogram.  [facility] - [phone]  *ask for the Radiology Department [facility] - [phone] or [phone]  [facility] - [phone] [place] [phone] [facility] - [phone]  *ask for the [facility] - [phone]  *ask for the Radiology Department [facility] - [phone]  *ask for the [place] - [phone] Tos en los adultos Cough, Adult La tos es un reflejo que despeja la garganta y las vas respiratorias. Toser ayuda a curar y [employer]. Es normal toser a Clinical cytogeneticist, pero si la tos aparece junto con otros sntomas o si dura [employer], puede indicar una enfermedad que necesita [place]. La tos puede durar solo 2 o 3semanas (aguda) o ms de 8semanas (crnica). Cules son las causas? Por lo general, las causas de la tos son las siguientes:  Aspirar  sustancias que [employer]  Una infeccin respiratoria de origen viral o bacteriano.  Alergias.  Asma.  Goteo posnasal.  Fumar.  cido que vuelve del estmago hacia el esfago (reflujo gastroesofgico ).  Ciertos medicamentos.  Enfermedades pulmonares crnicas, incluida la EPOC (o rara vez, cncer de pulmn).  Otras afecciones, por ejemplo, insuficiencia cardaca.  Siga estas indicaciones en su casa: Est atento a cualquier cambio en los sntomas. Tome estas medidas para Public house manager las molestias:  Tome los medicamentos solamente como se lo haya indicado el mdico. ? Si le recetaron un antibitico, tmelo como se lo haya indicado el mdico. No deje de tomar los antibiticos aunque comience a Sports administrator. ? Hable con el mdico antes de tomar un medicamento para la tos (antitusivo).  Beba suficiente lquido para Consulting civil engineer orina clara o de color amarillo plido.  Si el aire est seco, use un vaporizador o humidificador de niebla fra en la habitacin o en la casa para ayudar a aflojar las secreciones.  Evite todo lo que le provoque tos en el trabajo o en su casa.  Si la tos empeora por la noche, pruebe a dormir en posicin semierguida.  Evite el humo del cigarrillo. Si fuma, deje de hacerlo. Si necesita ayuda para dejar de fumar, consulte al mdico.  Evite la cafena.  Evite el alcohol.  Descanse todo lo que sea necesario.  Comunquese con un mdico si:  Aparecen nuevos sntomas.  Tose y escupe pus.  La tos no mejora despus de 2 o 3semanas o empeora.  No puede controlar la tos con antitusivos y  no puede dormir debido a ello.  Siente un dolor que empeora o que no puede controlar con medicamentos.  Tiene fiebre.  Baja de peso sin causa aparente.  Tiene transpiracin nocturna. Solicite ayuda de inmediato si:  Tose y [employer].  Tiene dificultad para respirar.  El [employer] late [place] rpido. Esta informacin no tiene Marine scientist el  consejo del mdico. Asegrese de hacerle al mdico cualquier pregunta que tenga. Document Released: 11/10/2010 Document Revised: 07/07/2016 Document Reviewed: 06/11/2014 Elsevier Interactive Patient Education  [employer].

## 2017-06-05 NOTE — Progress Notes (Signed)
Chief Complaint  Patient presents with  . URI    onset: 06/02/17 worse on Sunday, warm to touch but not temps taken, tylenol and sudafed for sxs.  Pt c/o ha, leg pain, chest/stomach pain, eyes blurry and cough and congestion.  Chills once but no chills since  . Cough    green and brown mucus drainage    HPI   Pt reports that she has been  Having pain in her arms, bones, chest, eyes and bone She started having symptoms Sunday No sick contacts She reports that her 9 yo son has a little bit of a cold     Past Medical History:  Diagnosis Date  . Abnormal Pap smear    patient states she had cancer that was removed from her cervix  . Asthma    when pregnant/ once 6 yrs ago only time asthma attack the dr said  . Cancer ([place])   . Clotting disorder ([place])   . Gestational diabetes   . Intramural leiomyoma of uterus 02/29/2016  . Pulmonary embolism ([place])   . Renal insufficiency   . Vitamin D deficiency   . Vitamin D deficiency     Current Outpatient Medications  Medication Sig Dispense Refill  . azithromycin (ZITHROMAX) 250 MG tablet Sig as indicated (Patient not taking: Reported on 05/30/2017) 6 tablet 0  . benzonatate (TESSALON) 100 MG capsule Take 1 capsule (100 mg total) by mouth 2 (two) times daily as needed for cough. 20 capsule 0  . fluticasone (FLONASE) 50 MCG/ACT nasal spray Place 2 sprays into both nostrils daily. 16 g 6  . gentamicin cream (GARAMYCIN) 0.1 % Apply 1 application 3 (three) times daily topically. (Patient not taking: Reported on 05/17/2017) 30 g 1  . guaiFENesin (MUCINEX) 600 MG 12 hr tablet Take 2 tablets (1,200 mg total) by mouth 2 (two) times daily. 60 tablet 0  . medroxyPROGESTERone (PROVERA) 5 MG tablet Take 1 tablet (5 mg total) by mouth daily. Every 3 months if no spontaneous period. 10 tablet 4  . traMADol (ULTRAM) 50 MG tablet Take 1 tablet (50 mg total) by mouth every 8 (eight) hours as needed. (Patient not taking: Reported on 05/30/2017) 15 tablet 0  .  triamcinolone cream (KENALOG) 0.1 % Apply 1 application topically 2 (two) times daily. (Patient not taking: Reported on 05/30/2017) 45 g 2  . Vitamin D, Ergocalciferol, (DRISDOL) 50000 units CAPS capsule TAKE 1 CAPSULE (50,000 UNITS TOTAL) BY MOUTH EVERY 7 (SEVEN) DAYS. (Patient not taking: Reported on 05/30/2017) 12 capsule 0   No current facility-administered medications for this visit.     Allergies:  Allergies  Allergen Reactions  . Latex Itching and Rash    Itching with use of condoms    Past Surgical History:  Procedure Laterality Date  . CHOLECYSTECTOMY      Social History   Socioeconomic History  . Marital status: Married    Spouse name: None  . Number of children: None  . Years of education: None  . Highest education level: None  Social Needs  . Financial resource strain: None  . Food insecurity - worry: None  . Food insecurity - inability: None  . Transportation needs - medical: None  . Transportation needs - non-medical: None  Occupational History  . None  Tobacco Use  . Smoking status: Never Smoker  . Smokeless tobacco: Never Used  Substance and Sexual Activity  . Alcohol use: Yes    Alcohol/week: 0.0 oz    Comment: Occasionally.  Marland Kitchen  Drug use: No  . Sexual activity: Yes    Partners: Male    Birth control/protection: Condom  Other Topics Concern  . None  Social History Narrative   Married with four children. Came to [place] from [place] in 2001. Unemployed currently. Spanish speaking.    Family History  Problem Relation Age of Onset  . Diabetes Mother   . Hypertension Mother   . Hyperlipidemia Father   . Diabetes Father   . Hypertension Father   . Heart disease Sister   . Diabetes Brother   . Hyperlipidemia Brother   . Drug abuse Paternal Grandmother   . Kidney disease Other   . Birth defects Other        anal atresia/stenosis     ROS Review of Systems See HPI Constitution: No fevers or chills No malaise No diaphoresis Skin: No rash  or itching Eyes: no blurry vision, no double vision GU: no dysuria or hematuria Neuro: no dizziness or headaches * all others reviewed and negative   Objective: Vitals:   06/05/17 0956  BP: 125/89  Pulse: (!) 103  Resp: 17  Temp: 99.8 F (37.7 C)  TempSrc: Oral  SpO2: 96%  Weight: 196 lb 6.4 oz (89.1 kg)  Height: 5\' 6"  (1.676 m)    Physical Exam General: alert, oriented, in NAD Head: normocephalic, atraumatic, no sinus tenderness Eyes: EOM intact, no scleral icterus or conjunctival injection Ears: TM clear bilaterally Nose: mucosa nonerythematous, nonedematous Throat: no pharyngeal exudate or erythema Lymph: no posterior auricular, submental or cervical lymph adenopathy Heart: normal rate, normal sinus rhythm, no murmurs Lungs: clear to auscultation bilaterally, no wheezing   Assessment and Plan Kelcie was seen today for uri and cough.  Diagnoses and all orders for this visit:  Flu-like symptoms -     POCT Influenza A/B  Acute URI Cough  Discussed supportive care No abx indicated Normal cardiopulm exam  -     fluticasone (FLONASE) 50 MCG/ACT nasal spray; Place 2 sprays into both nostrils daily. -     benzonatate (TESSALON) 100 MG capsule; Take 1 capsule (100 mg total) by mouth 2 (two) times daily as needed for cough. -     guaiFENesin (MUCINEX) 600 MG 12 hr tablet; Take 2 tablets (1,200 mg total) by mouth 2 (two) times daily.     [place]

## 2017-06-08 ENCOUNTER — Other Ambulatory Visit: Payer: Self-pay

## 2017-06-08 ENCOUNTER — Ambulatory Visit: Payer: BLUE CROSS/BLUE SHIELD | Admitting: Family Medicine

## 2017-06-08 ENCOUNTER — Ambulatory Visit (INDEPENDENT_AMBULATORY_CARE_PROVIDER_SITE_OTHER): Payer: BLUE CROSS/BLUE SHIELD

## 2017-06-08 ENCOUNTER — Encounter: Payer: Self-pay | Admitting: Family Medicine

## 2017-06-08 VITALS — BP 100/80 | HR 96 | Temp 98.5°F | Ht 66.0 in | Wt 196.0 lb

## 2017-06-08 DIAGNOSIS — R05 Cough: Secondary | ICD-10-CM

## 2017-06-08 DIAGNOSIS — R0602 Shortness of breath: Secondary | ICD-10-CM

## 2017-06-08 DIAGNOSIS — Z8709 Personal history of other diseases of the respiratory system: Secondary | ICD-10-CM

## 2017-06-08 DIAGNOSIS — J069 Acute upper respiratory infection, unspecified: Secondary | ICD-10-CM

## 2017-06-08 DIAGNOSIS — J189 Pneumonia, unspecified organism: Secondary | ICD-10-CM

## 2017-06-08 DIAGNOSIS — R059 Cough, unspecified: Secondary | ICD-10-CM

## 2017-06-08 LAB — POCT CBC
Granulocyte percent: 50.1 %G (ref 37–80)
HCT, POC: 42 % (ref 37.7–47.9)
Hemoglobin: 13.7 g/dL (ref 12.2–16.2)
Lymph, poc: 2.2 (ref 0.6–3.4)
MCH, POC: 29.9 pg (ref 27–31.2)
MCHC: 32.7 g/dL (ref 31.8–35.4)
MCV: 91.4 fL (ref 80–97)
MID (cbc): 0.4 (ref 0–0.9)
MPV: 7.9 fL (ref 0–99.8)
POC Granulocyte: 2.6 (ref 2–6.9)
POC LYMPH PERCENT: 42.1 %L (ref 10–50)
POC MID %: 7.8 %M (ref 0–12)
Platelet Count, POC: 221 10*3/uL (ref 142–424)
RBC: 4.6 M/uL (ref 4.04–5.48)
RDW, POC: 12.9 %
WBC: 5.2 10*3/uL (ref 4.6–10.2)

## 2017-06-08 MED ORDER — AZITHROMYCIN 250 MG PO TABS: ORAL_TABLET | ORAL | 0 refills | Status: DC

## 2017-06-08 MED ORDER — BENZONATATE 100 MG PO CAPS: 100.0000 mg | ORAL_CAPSULE | Freq: Three times a day (TID) | ORAL | 0 refills | Status: DC | PRN

## 2017-06-08 NOTE — Patient Instructions (Addendum)
     IF you received an x-ray today, you will receive an invoice from [facility]. Please contact [facility] at [phone] with questions or concerns regarding your invoice.   IF you received labwork today, you will receive an invoice from [place]. Please contact [facility] at [phone] with questions or concerns regarding your invoice.   Our billing staff will not be able to assist you with questions regarding bills from these companies.  You will be contacted with the lab results as soon as they are available. The fastest way to get your results is to activate your My Chart account. Instructions are located on the last page of this paperwork. If you have not heard from [place] regarding the results in 2 weeks, please contact this office.    Neumona extrahospitalaria en los adultos (Community-Acquired Pneumonia, Adult) La neumona es una infeccin en los pulmones. La neumona puede contagiarse mientras una persona est hospitalizada. Cuando una persona no est en el hospital, puede tener un tipo diferente de la enfermedad (neumona extrahospitalaria), la cual se transmite fcilmente de Ardelia Mems persona a Theatre manager. Una persona puede contraer neumona extrahospitalaria si respira cerca de un individuo infectado que tose o estornuda. Algunos sntomas incluyen lo siguiente:  Tos seca.  Tos con expectoracin (productiva).  Cristy Hilts.  Sudoracin.  Dolor en el pecho. [place] los medicamentos de venta libre y los recetados solamente como se lo haya indicado el mdico. ? Tome los medicamentos para la tos solamente si no puede dormir bien. ? Si le recetaron un antibitico, tmelo como se lo haya indicado el mdico. No deje de tomar los antibiticos aunque comience a Sports administrator.  Duerma con la cabeza y el cuello elevados. Para lograrlo, colquese algunas almohadas debajo de la cabeza, o bien puede dormir en un silln reclinable.  No consuma productos que  contengan tabaco. Estos incluyen cigarrillos, tabaco para mascar y Psychologist, sport and exercise. Si necesita ayuda para dejar de fumar, consulte al mdico.  Beba suficiente agua para mantener la orina clara o de color amarillo plido. Una vacuna puede ayudar a prevenir la neumona. Habitualmente, la vacuna se recomienda a:  Las [employer] de 83MHD.  Las [employer] de 19aos: ? Las personas que estn recibiendo tratamiento para Science writer. ? [employer] tienen una enfermedad pulmonar a largo plazo (crnica). ? Las [employer] tienen problemas del sistema de defensa del organismo (sistema inmunitario). Tambin puede evitar la neumona si toma las siguientes medidas:  Se aplica la vacuna antigripal todos los [place].  Visita al dentista con la frecuencia indicada.  Se lava las manos a menudo. Utilice un desinfectante para manos si no dispone de [place] y [place]. SOLICITE AYUDA SI:  Tiene fiebre.  No puede dormir bien porque el medicamento para la tos no resulta eficaz. SOLICITE AYUDA DE INMEDIATO SI:  Le falta el aire y este sntoma [place].  Aumenta el dolor en el pecho.  La enfermedad empeora. Esto es muy grave si: ? Es Media planner. ? El sistema de defensa del organismo est debilitado.  Tose y escupe sangre. Esta informacin no tiene Marine scientist el consejo del mdico. Asegrese de hacerle al mdico cualquier pregunta que tenga. Document Released: 09/22/2009 Document Revised: 12/24/2014 Document Reviewed: 07/30/2014 Elsevier Interactive Patient Education  Henry Schein.

## 2017-06-08 NOTE — Progress Notes (Signed)
2/21/201911:06 AM  Dougherty May 09, 1970, 47 y.o. female [id number]  Chief Complaint  Patient presents with  . Cough    has been coughing for 1 wk    HPI:   Patient is a 47 y.o. female with past medical history significant for PE in 2016 during postpartum period who presents today for 1 week of worsening productive cough, sweats, SOB, nasal congestion. Started with tactile fevers, body aches, headaches, those symptoms resolved but cough and nasal congestion has persisted. Denies any chest pain, palpitations, nausea, vomiting, diarrhea. Flu test was negative 06/05/17.  Depression screen [facility] 2/9 06/08/2017 06/05/2017 05/17/2017  Decreased Interest 0 0 0  Down, Depressed, Hopeless 0 0 0  PHQ - 2 Score 0 0 0    Allergies  Allergen Reactions  . Latex Itching and Rash    Itching with use of condoms    Prior to Admission medications   Medication Sig Start Date End Date Taking? Authorizing Provider  fluticasone (FLONASE) 50 MCG/ACT nasal spray Place 2 sprays into both nostrils daily. 06/05/17  Yes Forrest Moron, MD  guaiFENesin (MUCINEX) 600 MG 12 hr tablet Take 2 tablets (1,200 mg total) by mouth 2 (two) times daily. 06/05/17  Yes Forrest Moron, MD  medroxyPROGESTERone (PROVERA) 5 MG tablet Take 1 tablet (5 mg total) by mouth daily. Every 3 months if no spontaneous period. 02/03/17 02/13/17  Princess Bruins, MD  Vitamin D, Ergocalciferol, (DRISDOL) 50000 units CAPS capsule Take 1 capsule (50,000 Units total) by mouth every 7 (seven) days. Patient not taking: Reported on 06/08/2017 06/05/17   Princess Bruins, MD    Past Medical History:  Diagnosis Date  . Abnormal Pap smear    patient states she had cancer that was removed from her cervix  . Asthma    when pregnant/ once 6 yrs ago only time asthma attack the dr said  . Cancer ([place])   . Clotting disorder ([place])   . Gestational diabetes   . Intramural leiomyoma of uterus 02/29/2016  . Pulmonary embolism ([place])     . Renal insufficiency   . Vitamin D deficiency   . Vitamin D deficiency     Past Surgical History:  Procedure Laterality Date  . CHOLECYSTECTOMY      Social History   Tobacco Use  . Smoking status: Never Smoker  . Smokeless tobacco: Never Used  Substance Use Topics  . Alcohol use: Yes    Alcohol/week: 0.0 oz    Comment: Occasionally.    Family History  Problem Relation Age of Onset  . Diabetes Mother   . Hypertension Mother   . Hyperlipidemia Father   . Diabetes Father   . Hypertension Father   . Heart disease Sister   . Diabetes Brother   . Hyperlipidemia Brother   . Drug abuse Paternal Grandmother   . Kidney disease Other   . Birth defects Other        anal atresia/stenosis    Review of Systems  Constitutional: Positive for diaphoresis and malaise/fatigue. Negative for chills and fever.  Respiratory: Positive for cough, sputum production and shortness of breath. Negative for hemoptysis.   Cardiovascular: Negative for chest pain and palpitations.  Gastrointestinal: Negative for abdominal pain, diarrhea, nausea and vomiting.  Skin: Negative for rash.  Neurological: Negative for dizziness.    OBJECTIVE:  Blood pressure 100/80, pulse 96, temperature 98.5 F (36.9 C), temperature source Oral, height 5\' 6"  (1.676 m), weight 196 lb (88.9 kg), last menstrual period 05/21/2017,  SpO2 98 %.  Physical Exam  Constitutional: She is oriented to person, place, and time and well-developed, well-nourished, and in no distress.  HENT:  Head: Normocephalic and atraumatic.  Right Ear: Hearing, tympanic membrane, external ear and ear canal normal.  Left Ear: Hearing, tympanic membrane, external ear and ear canal normal.  Mouth/Throat: Oropharynx is clear and moist.  Eyes: EOM are normal. Pupils are equal, round, and reactive to light.  Neck: Neck supple.  Cardiovascular: Normal rate, regular rhythm and normal heart sounds. Exam reveals no gallop and no friction rub.  No  murmur heard. Pulmonary/Chest: Effort normal. She has no wheezes. She has rhonchi (diffuse). She has no rales.  Lymphadenopathy:    She has no cervical adenopathy.  Neurological: She is alert and oriented to person, place, and time. Gait normal.  Skin: Skin is warm. She is diaphoretic.      Results for orders placed or performed in visit on 06/08/17 (from the past 24 hour(s))  POCT CBC     Status: None   Collection Time: 06/08/17 11:31 AM  Result Value Ref Range   WBC 5.2 4.6 - 10.2 K/uL   Lymph, poc 2.2 0.6 - 3.4   POC LYMPH PERCENT 42.1 10 - 50 %L   MID (cbc) 0.4 0 - 0.9   POC MID % 7.8 0 - 12 %M   POC Granulocyte 2.6 2 - 6.9   Granulocyte percent 50.1 37 - 80 %G   RBC 4.60 4.04 - 5.48 M/uL   Hemoglobin 13.7 12.2 - 16.2 g/dL   HCT, POC 42.0 37.7 - 47.9 %   MCV 91.4 80 - 97 fL   MCH, POC 29.9 27 - 31.2 pg   MCHC 32.7 31.8 - 35.4 g/dL   RDW, POC 12.9 %   Platelet Count, POC 221 142 - 424 K/uL   MPV 7.9 0 - 99.8 fL    Dg Chest 2 View  Result Date: 06/08/2017 CLINICAL DATA:  Productive cough.  Shortness of breath. EXAM: CHEST  2 VIEW COMPARISON:  10/16/2015. FINDINGS: Mediastinum hilar structures normal. Lungs are clear. No pleural effusion or pneumothorax. Degenerative changes thoracic spine with scoliosis. Surgical clips right upper quadrant. IMPRESSION: No acute cardiopulmonary disease. Electronically Signed   By: Marcello Moores  Register   On: 06/08/2017 11:39     ASSESSMENT and PLAN  1. Community acquired pneumonia, unspecified laterality Patient was improving and now worsening, CBC and CXR were normal however exam concerning for early developing pneumonia, will therefore treat. Supportive measures and RTC precautions reviewed.   2. Cough - POCT CBC - DG Chest 2 View; Future  3. SOB (shortness of breath) - POCT CBC - DG Chest 2 View; Future  4. Recent URI  Other orders - azithromycin (ZITHROMAX) 250 MG tablet; Take 2 tabs PO on first day and then 1 tab PO once a day  for the next 4 days - benzonatate (TESSALON) 100 MG capsule; Take 1-2 capsules (100-200 mg total) by mouth 3 (three) times daily as needed for cough.  Return in about 2 weeks (around 06/22/2017).    Rutherford Guys, MD Primary Care at [place], [place] [address] Ph.  [phone] Fax [phone]

## 2017-06-12 ENCOUNTER — Other Ambulatory Visit: Payer: Self-pay

## 2017-06-12 ENCOUNTER — Encounter: Payer: Self-pay | Admitting: Family Medicine

## 2017-06-12 ENCOUNTER — Ambulatory Visit: Payer: BLUE CROSS/BLUE SHIELD | Admitting: Family Medicine

## 2017-06-12 VITALS — BP 110/68 | HR 82 | Temp 98.0°F | Resp 16 | Ht 65.75 in | Wt 194.0 lb

## 2017-06-12 DIAGNOSIS — J0100 Acute maxillary sinusitis, unspecified: Secondary | ICD-10-CM | POA: Diagnosis not present

## 2017-06-12 DIAGNOSIS — R062 Wheezing: Secondary | ICD-10-CM

## 2017-06-12 DIAGNOSIS — Z131 Encounter for screening for diabetes mellitus: Secondary | ICD-10-CM | POA: Diagnosis not present

## 2017-06-12 LAB — GLUCOSE, POCT (MANUAL RESULT ENTRY): POC Glucose: 71 mg/dl (ref 70–99)

## 2017-06-12 LAB — POCT GLYCOSYLATED HEMOGLOBIN (HGB A1C): Hemoglobin A1C: 5.3

## 2017-06-12 MED ORDER — PREDNISONE 20 MG PO TABS: ORAL_TABLET | ORAL | 0 refills | Status: DC

## 2017-06-12 MED ORDER — AMOXICILLIN 500 MG PO TABS
1000.0000 mg | ORAL_TABLET | Freq: Two times a day (BID) | ORAL | 0 refills | Status: DC
Start: 2017-06-12 — End: 2017-09-05

## 2017-06-12 MED ORDER — ALBUTEROL SULFATE HFA 108 (90 BASE) MCG/ACT IN AERS: 2.0000 | INHALATION_SPRAY | Freq: Four times a day (QID) | RESPIRATORY_TRACT | 1 refills | Status: DC | PRN

## 2017-06-12 NOTE — Patient Instructions (Addendum)
IF you received an x-ray today, you will receive an invoice from [facility]. Please contact [facility] at [phone] with questions or concerns regarding your invoice.   IF you received labwork today, you will receive an invoice from [place]. Please contact [facility] at [phone] with questions or concerns regarding your invoice.   Our billing staff will not be able to assist you with questions regarding bills from these companies.  You will be contacted with the lab results as soon as they are available. The fastest way to get your results is to activate your My Chart account. Instructions are located on the last page of this paperwork. If you have not heard from [place] regarding the results in 2 weeks, please contact this office.      Broncoespasmo - Adultos (Bronchospasm, Adult) Broncoespasmo significa que hay un espasmo o restriccin de las vas areas que llevan el aire a los pulmones. Durante el broncoespasmo, la respiracin se hace ms difcil debido a que las vas respiratorias se contraen. Cuando esto ocurre, puede haber tos, un silbido al respirar (sibilancias) presin en el pecho y dificultad para respirar. Generalmente se asocia al asma, pero no todos los pacientes que experimentan broncoespasmos sufren asma. CAUSAS La causa del broncoespasmo es la inflamacin o la irritacin de las vas respiratorias. La inflamacin o la irritacin pueden haber sido desencadenadas por:  Set designer (por ejemplo a animales, polen, alimentos y moho). Los alrgenos que causan el broncoespasmo pueden producir sibilancias inmediatamente despus de la exposicin, o varias horas despus.  Infeccin. Se considera que la causa ms frecuente son las infecciones virales.  Ejercicios.  Irritantes (como la polucin, humo de cigarrillos, olores fuertes, Nature conservation officer y vapores de [place]).  Los cambios climticos. El viento aumenta la cantidad de moho y polen del aire. La lluvia limpia  el aire arrastrando las sustancias irritantes. El aire fro puede causar inflamacin.  Estrs y [employer]. [place].  Tos excesiva durante la noche.  Tos frecuente o intensa durante un resfro comn.  Opresin en el pecho.  Falta de aire.  DIAGNSTICO El broncoespasmo se diagnostica con la historia clnica y un examen fsico. En algunos casos se indican otros estudios, como radiografas de trax, para Clinical research associate. TRATAMIENTO  Le indicarn medicamentos por va inhalatoria para abrir las vas respiratorias y ayudarlo a Ambulance person. Los medicamentos se administran por medio de Educational psychologist o Furniture conservator/restorer.  Le administrarn corticoides en los casos de broncoespasmo grave, generalmente cuando se asocia al asma.  INSTRUCCIONES PARA EL CUIDADO EN EL HOGAR  Siempre tenga un plan preparado para pedir asistencia mdica. Sepa cuando debe llamar al mdico y a los servicios de emergencia de su localidad (911 en [place]). Sepa donde puede acceder a un servicio de emergencias.  Tome todos los medicamentos como le indic el mdico.  Si le indicaron el uso de un inhalador o nebulizador, consulte a su mdico para que le explique cmo usarlo correctamente. Siempre use un espaciador con el inhalador, si le indicaron su uso.  Es Manufacturing systems engineer muy tranquilo durante el ataque. Trate de relajarse y respirar ms lentamente.  Controle el ambiente del hogar del siguiente modo: ? Cambie el filtro de la calefaccin y del aire acondicionado al menos una vez al mes. ? Limite el uso de hogares o estufas a lea. ? No fume y nopermita que fumen en su hogar. ? Evite la exposicin a perfumes y fragancias. ? Elimine las plagas (como cucarachas, ratones) y  sus excrementos. ? Elimine las plantas si observa moho en ellas. ? Mantenga su casa limpia y [place]. ? Reemplace las alfombras por pisos de [place], baldosas o vinilo. Las alfombras pueden retener las  escamas o pelos de los animales y [place]. ? Use almohadas, mantas y cubre colchones antialrgicos. ? [place] sbanas y las mantas todas las semanas con agua caliente y squelas con aire caliente. ? Use mantas de [place] o algodn. ? Lvese las manos con frecuencia.  SOLICITE ATENCIN MDICA SI:  Tiene dolores musculares.  Siente dolor en el pecho.  El esputo cambia de un color claro o blanco a un color amarillo, verde, gris o sanguinolento.  El esputo que elimina es ms espeso.  Tiene problemas relacionados con el medicamento que recibe. como urticaria, picazn, hinchazn o dificultades respiratorias.  [place] DE INMEDIATO SI:  [place] sibilancias y la tos, an despus de tomar los medicamentos recetados.  Tiene una creciente dificultad para respirar.  Siente un dolor intenso en el pecho.  ASEGRESE DE QUE:  Comprende estas instrucciones.  Controlar su afeccin.  Recibir ayuda de inmediato si no mejora o si empeora.  Esta informacin no tiene Marine scientist el consejo del mdico. Asegrese de hacerle al mdico cualquier pregunta que tenga. Document Released: 07/12/2007 Document Revised: 04/25/2014 Document Reviewed: 09/24/2012 Elsevier Interactive Patient Education  2017 [place] de manos (Contractor) Los microbios, como las bacterias, los virus y los parsitos, se encuentran en todos lados. Pueden estar en el aire y en el agua, y en superficies como alimentos, picaportes y la piel. [place] manos entran en contacto con microbios, muchos de los cuales pueden hacer que usted y su familia se enfermen. Lavarse las manos es una de las maneras ms fciles y eficaces de reducir el riesgo de Museum/gallery curator y transmitir microbios. CUNDO DEBO LAVARME LAS MANOS?  Debe lavarse las manos siempre que crea que estn sucias. Tambin debe lavarse las manos:  Antes de: ? Visitar a un beb o a alguien con un sistema de defensa  (inmunitario) debilitado o deprimido. ? Ponerse y [employer] de [place].  Despus de: ? Estrellita Ludwig y jugar al [employer]. ? Tocar un animal, sus juguetes o su correa. ? Manipular ganado. ? Ir al bao. ? Usar productos de limpieza del hogar o sustancias qumicas txicas. ? Tocar o sacar la basura. ? Tocar cualquier objeto sucio en su casa. ? Manipular ropa o trapos sucios. ? Cuidar a un nio enfermo. Esto incluye tocar pauelos usados, juguetes y ropa. ? Estornudar, toser o Marine scientist. ? Viajar en transporte pblico. ? Dar la mano. ? Usar un telfono, incluido su telfono mvil. ? Tocar dinero.  Antes y despus de: ? Preparar comida. ? Preparar el bibern de un beb. ? Alimentar a un beb o a un nio pequeo. ? [place]. ? Visitar o cuidar a alguien enfermo. ? Cambiar un paal. ? Cambiar un vendaje, o cuidar de una lesin o herida. ? Administrar o tomar medicamentos. CUL ES LA MANERA CORRECTA DE LAVARME LAS MANOS?  Mjese las manos con agua corriente limpia.  Aplquese jabn lquido o en barra.  Frtese las manos rpidamente para TEFL teacher.  Hgalo durante al menos 20 segundos. Refriegue bien todas las partes de las manos, incluido debajo de las uas y [employer] dedos.  Enjuguese las manos con agua corriente limpia hasta que no [place].  Squese las [employer] con Geophysical data processor  de aire o con una toalla limpia de papel o tela, o deje que se sequen al aire. No use su ropa o una toalla sucia para secarse.  Si se encuentra en un bao pblico, use su toalla para: ? Cerrar el grifo. ? Abrir la puerta del bao. [place] LAS MANOS SI NO TENGO AGUA Y JABN?  Si no tiene [place] y [place], use una toallita, un aerosol o un gel para manos a base de alcohol. Utilice un agente desinfectante para manos que contenga al menos un 60% de alcohol. Si preparar comida, no se recomiendan los desinfectantes para manos como sustituto del lavado de [place]. Para usar un  desinfectante para manos, siga las indicaciones proporcionadas en el producto.  Aplquese una cantidad [place] de producto en las manos.  Asegrese de frotar o rociar el producto de modo tal que llegue a todas las partes [place] manos y las [place]. Incluya la parte de atrs de las manos, entre los dedos y debajo de las uas.  Frtese el producto [employer] se seque. Esta informacin no tiene Marine scientist el consejo del mdico. Asegrese de hacerle al mdico cualquier pregunta que tenga. Document Released: 09/21/2007 Document Revised: 04/25/2014 Document Reviewed: 08/30/2013 Elsevier Interactive Patient Education  Henry Schein.

## 2017-06-12 NOTE — Progress Notes (Signed)
Subjective:    Patient ID: [facility], female    DOB: 1971/02/19, 47 y.o.   MRN: [id number]  06/12/2017  Pneumonia (pt was Dx with Pneumonia on 06/08/2017 and states she feels worst. Pt states she has more mucues and nasasal drainage )    HPI This 47 y.o. female presents for evaluation of worsening symptoms.  Having a lot of congestion, pain in chest, pain in nose, headache.  No fever; +sweats; +chills.  No ear pain or sore throat.  Excessive rhinorrhea; +nasal congestion.  Congestion in chest.  +sputum production yellow.  Nasal congestion yellow.  SOB none. No n/v but +diarrhea yesterday; none today.  Son recently sick also with same symptoms. Homemaker.  Completed Zithromax.    Treated on 06/08/17 by [place] for presumed pneumonia; treated with Zithromax and Tessalon Perles; CXR negative.   WBC 5.2 at visit on 06/08/17.  Treated 06/05/17 by Nolon Rod. Treated for URI with Tessalon Perles and Flonase and Guafenesin.  Flu negative on 06/05/17.  Very concerned with recurrent nature of acute illnesses.  Patient has 4 children at home.   BP Readings from Last 3 Encounters:  06/12/17 110/68  06/08/17 100/80  06/05/17 125/89   Wt Readings from Last 3 Encounters:  06/12/17 194 lb (88 kg)  06/08/17 196 lb (88.9 kg)  06/05/17 196 lb 6.4 oz (89.1 kg)   Immunization History  Administered Date(s) Administered  . Influenza,inj,Quad PF,6+ Mos 05/20/2014, 01/28/2015, 02/03/2017  . Tdap 06/26/2013    Review of Systems  Constitutional: Negative for chills, diaphoresis, fatigue and fever.  HENT: Positive for congestion, postnasal drip, rhinorrhea and sinus pain. Negative for ear pain, sore throat and trouble swallowing.   Respiratory: Positive for cough. Negative for shortness of breath, wheezing and stridor.   Cardiovascular: Negative for chest pain, palpitations and leg swelling.  Gastrointestinal: Positive for diarrhea. Negative for abdominal distention, abdominal pain, anal  bleeding, blood in stool, constipation, nausea and vomiting.  Skin: Negative for rash.  Neurological: Positive for headaches.    Past Medical History:  Diagnosis Date  . Abnormal Pap smear    patient states she had cancer that was removed from her cervix  . Asthma    when pregnant/ once 6 yrs ago only time asthma attack the dr said  . Cancer ([place])   . Clotting disorder ([place])   . Gestational diabetes   . Intramural leiomyoma of uterus 02/29/2016  . Pulmonary embolism ([place])   . Renal insufficiency   . Vitamin D deficiency   . Vitamin D deficiency    Past Surgical History:  Procedure Laterality Date  . CHOLECYSTECTOMY     Allergies  Allergen Reactions  . Latex Itching and Rash    Itching with use of condoms   Current Outpatient Medications on File Prior to Visit  Medication Sig Dispense Refill  . benzonatate (TESSALON) 100 MG capsule Take 1-2 capsules (100-200 mg total) by mouth 3 (three) times daily as needed for cough. 20 capsule 0  . fluticasone (FLONASE) 50 MCG/ACT nasal spray Place 2 sprays into both nostrils daily. 16 g 6  . Vitamin D, Ergocalciferol, (DRISDOL) 50000 units CAPS capsule Take 1 capsule (50,000 Units total) by mouth every 7 (seven) days. 12 capsule 0  . medroxyPROGESTERone (PROVERA) 5 MG tablet Take 1 tablet (5 mg total) by mouth daily. Every 3 months if no spontaneous period. 10 tablet 4   No current facility-administered medications on file prior to visit.    Social History  Socioeconomic History  . Marital status: Married    Spouse name: Not on file  . Number of children: Not on file  . Years of education: Not on file  . Highest education level: Not on file  Social Needs  . Financial resource strain: Not on file  . Food insecurity - worry: Not on file  . Food insecurity - inability: Not on file  . Transportation needs - medical: Not on file  . Transportation needs - non-medical: Not on file  Occupational History  . Not on file  Tobacco Use    . Smoking status: Never Smoker  . Smokeless tobacco: Never Used  Substance and Sexual Activity  . Alcohol use: Yes    Alcohol/week: 0.0 oz    Comment: Occasionally.  . Drug use: No  . Sexual activity: Yes    Partners: Male    Birth control/protection: Condom  Other Topics Concern  . Not on file  Social History Narrative   Married with four children. Came to [place] from [place] in 2001. Unemployed currently. Spanish speaking.   Family History  Problem Relation Age of Onset  . Diabetes Mother   . Hypertension Mother   . Hyperlipidemia Father   . Diabetes Father   . Hypertension Father   . Heart disease Sister   . Diabetes Brother   . Hyperlipidemia Brother   . Drug abuse Paternal Grandmother   . Kidney disease Other   . Birth defects Other        anal atresia/stenosis       Objective:    BP 110/68   Pulse 82   Temp 98 F (36.7 C) (Oral)   Resp 16   Ht 5' 5.75" (1.67 m)   Wt 194 lb (88 kg)   LMP 05/21/2017   SpO2 98%   BMI 31.55 kg/m  Physical Exam  Constitutional: She is oriented to person, place, and time. She appears well-developed and well-nourished. No distress.  HENT:  Head: Normocephalic and atraumatic.  Right Ear: Tympanic membrane, external ear and ear canal normal.  Left Ear: Tympanic membrane, external ear and ear canal normal.  Nose: Mucosal edema and rhinorrhea present. Right sinus exhibits maxillary sinus tenderness. Right sinus exhibits no frontal sinus tenderness. Left sinus exhibits maxillary sinus tenderness. Left sinus exhibits no frontal sinus tenderness.  Mouth/Throat: Uvula is midline, oropharynx is clear and moist and mucous membranes are normal. No oropharyngeal exudate.  Eyes: Conjunctivae are normal. Pupils are equal, round, and reactive to light.  Neck: Normal range of motion. Neck supple.  Cardiovascular: Normal rate, regular rhythm and normal heart sounds. Exam reveals no gallop and no friction rub.  No murmur  heard. Pulmonary/Chest: Effort normal. She has wheezes in the right lower field and the left lower field. She has no rales.  Neurological: She is alert and oriented to person, place, and time.  Skin: She is not diaphoretic.  Psychiatric: She has a normal mood and affect. Her behavior is normal.  Nursing note and vitals reviewed.  No results found. Depression screen [facility] 2/9 06/12/2017 06/08/2017 06/05/2017 05/17/2017 04/04/2017  Decreased Interest 0 0 0 0 0  Down, Depressed, Hopeless 0 0 0 0 0  PHQ - 2 Score 0 0 0 0 0   Fall Risk  06/12/2017 06/08/2017 06/05/2017 05/17/2017 04/04/2017  Falls in the past year? No No No No No    Results for orders placed or performed in visit on 06/12/17  POCT glucose (manual entry)  Result Value Ref  Range   POC Glucose 71 70 - 99 mg/dl  POCT glycosylated hemoglobin (Hb A1C)  Result Value Ref Range   Hemoglobin A1C 5.3        Assessment & Plan:   1. Wheezing   2. Acute non-recurrent maxillary sinusitis   3. Screening for diabetes mellitus    New onset wheezing/bronchospasm with recent upper respiratory infection.  Prescription for prednisone and albuterol HFA provided.  History of asthma as a child which predisposes patient to recurrent bronchospasm with acute illnesses.  Educated patient regarding this vulnerability of bronchospasm with recurrent illnesses.  Acute onset maxillary sinusitis: New onset.  Diagnosed with upper respiratory infection within the past 10 days.  Completed Zithromax therapy and currently suffering with significant nasal congestion and sinus pressure.  Treat with amoxicillin therapy.  Prednisone will also help with sinus inflammation.  Continue with supportive care with nasal saline, Flonase therapy.  Screening for diabetes mellitus: Normal glucose and hemoglobin A1c.  Reassurance provided to patient.  Patient also concerned regarding recurrent illnesses in the past few years.  Most consistent with parent providing care to children  who are acutely ill.  Recommend frequent handwashing, good sleep with a minimum of 7 hours per night, regular exercise, good nutrition.  Also recommend regular immunization for patient and children.   Orders Placed This Encounter  Procedures  . POCT glucose (manual entry)  . POCT glycosylated hemoglobin (Hb A1C)   Meds ordered this encounter  Medications  . amoxicillin (AMOXIL) 500 MG tablet    Sig: Take 2 tablets (1,000 mg total) by mouth 2 (two) times daily.    Dispense:  40 tablet    Refill:  0  . predniSONE (DELTASONE) 20 MG tablet    Sig: Take 3 PO QAM x 1 day, 2 PO QAM x 5 days, 1 PO QAM x 5 days    Dispense:  18 tablet    Refill:  0  . albuterol (PROVENTIL HFA;VENTOLIN HFA) 108 (90 Base) MCG/ACT inhaler    Sig: Inhale 2 puffs into the lungs every 6 (six) hours as needed for wheezing or shortness of breath.    Dispense:  1 Inhaler    Refill:  1    No Follow-up on file.   Samarion Ehle Elayne Guerin, M.D. Primary Care at [facility] [address] [place], [place]  [address] [phone] phone [phone] fax

## 2017-06-21 ENCOUNTER — Telehealth: Payer: Self-pay | Admitting: Family Medicine

## 2017-06-21 NOTE — Telephone Encounter (Signed)
Called and spoke to pt to remind them of their apt tomorrow. Advised of building number and time restrictions.

## 2017-06-22 ENCOUNTER — Ambulatory Visit: Payer: BLUE CROSS/BLUE SHIELD | Admitting: Family Medicine

## 2017-06-22 ENCOUNTER — Other Ambulatory Visit: Payer: Self-pay

## 2017-06-22 ENCOUNTER — Encounter: Payer: Self-pay | Admitting: Family Medicine

## 2017-06-22 VITALS — BP 102/64 | HR 76 | Temp 98.6°F | Ht 67.0 in | Wt 194.4 lb

## 2017-06-22 DIAGNOSIS — R05 Cough: Secondary | ICD-10-CM | POA: Diagnosis not present

## 2017-06-22 DIAGNOSIS — J310 Chronic rhinitis: Secondary | ICD-10-CM

## 2017-06-22 DIAGNOSIS — R059 Cough, unspecified: Secondary | ICD-10-CM

## 2017-06-22 MED ORDER — AZELASTINE HCL 0.1 % NA SOLN: 1.0000 | Freq: Two times a day (BID) | NASAL | 1 refills | Status: DC

## 2017-06-22 MED ORDER — FLUTICASONE PROPIONATE 50 MCG/ACT NA SUSP: 1.0000 | Freq: Two times a day (BID) | NASAL | 6 refills | Status: DC

## 2017-06-22 NOTE — Progress Notes (Signed)
Subjective:    Patient ID: [facility], female    DOB: [birth year], 47 y.o.   MRN: [id number]   Chief Complaint  Patient presents with  . Follow-up    follow up on Pnuemonia visit. Having night time coughing, congestion, and fatigue.    47 yo F with PMH sign for asthma and PE during pregnancy that presents for follow-up on CAP treated empirically on 06/08/17. She however was not getting better so was seen again on 06/12/17, treated for sinusitis and wheezing with amox, albuterol and pred. She has one more day of abx and pred . She reports that she was feeling better but then about 2 days ago started having increase nasal congestion and coughing. Feeling tired. Not sleeping well from coughing and difficult to breath. Sleeping upright. She denies any SOB during the day when she is active. She reports sneezing. She is using flonase daily.   Patient Active Problem List   Diagnosis Date Noted  . Acute laryngitis 05/17/2017  . Skin lesion of back 02/13/2017  . Sore throat 10/27/2016  . Intramural leiomyoma of uterus 02/29/2016  . GERD (gastroesophageal reflux disease) 10/19/2015  . History of pulmonary embolus (PE) 02/24/2015  . Hx of pulmonary embolus during pregnancy 11/09/2014   Allergies  Allergen Reactions  . Latex Itching and Rash    Itching with use of condoms   Prior to Admission medications   Medication Sig Start Date End Date Taking? Authorizing Provider  amoxicillin (AMOXIL) 500 MG tablet Take 2 tablets (1,000 mg total) by mouth 2 (two) times daily. 06/12/17  Yes Wardell Honour, MD  fluticasone [facility]) 50 MCG/ACT nasal spray Place 2 sprays into both nostrils daily. 06/05/17  Yes Forrest Moron, MD  predniSONE (DELTASONE) 20 MG tablet Take 3 PO QAM x 1 day, 2 PO QAM x 5 days, 1 PO QAM x 5 days 06/12/17  Yes Wardell Honour, MD  Vitamin D, Ergocalciferol, (DRISDOL) 50000 units CAPS capsule Take 1 capsule (50,000 Units total) by mouth every 7 (seven) days. 06/05/17   Yes Princess Bruins, MD  albuterol (PROVENTIL HFA;VENTOLIN HFA) 108 (90 Base) MCG/ACT inhaler Inhale 2 puffs into the lungs every 6 (six) hours as needed for wheezing or shortness of breath. Patient not taking: Reported on 06/22/2017 06/12/17   Wardell Honour, MD  medroxyPROGESTERone (PROVERA) 5 MG tablet Take 1 tablet (5 mg total) by mouth daily. Every 3 months if no spontaneous period. 02/03/17 02/13/17  Princess Bruins, MD    Past Medical History:  Diagnosis Date  . Abnormal Pap smear    patient states she had cancer that was removed from her cervix  . Asthma    when pregnant/ once 6 yrs ago only time asthma attack the dr said  . Cancer ([place])   . Clotting disorder ([place])   . Gestational diabetes   . Intramural leiomyoma of uterus 02/29/2016  . Pulmonary embolism ([place])   . Renal insufficiency   . Vitamin D deficiency   . Vitamin D deficiency    Social History   Socioeconomic History  . Marital status: Married    Spouse name: Not on file  . Number of children: 5  . Years of education: Not on file  . Highest education level: Not on file  Social Needs  . Financial resource strain: Not on file  . Food insecurity - worry: Not on file  . Food insecurity - inability: Not on file  . Transportation needs - medical:  Not on file  . Transportation needs - non-medical: Not on file  Occupational History  . Occupation: Stay at home mom  Tobacco Use  . Smoking status: Never Smoker  . Smokeless tobacco: Never Used  Substance and Sexual Activity  . Alcohol use: Yes    Alcohol/week: 0.0 oz    Comment: Occasionally.  . Drug use: No  . Sexual activity: Yes    Partners: Male    Birth control/protection: Condom  Other Topics Concern  . Not on file  Social History Narrative   Married with five children. Came to [place] from [place] in 2001. Unemployed currently. Spanish speaking.    Family History  Problem Relation Age of Onset  . Diabetes Mother   . Hypertension Mother   .  Hyperlipidemia Father   . Diabetes Father   . Hypertension Father   . Heart disease Sister   . Diabetes Brother   . Hyperlipidemia Brother   . Drug abuse Paternal Grandmother   . Kidney disease Other   . Birth defects Other        anal atresia/stenosis   Past Surgical History:  Procedure Laterality Date  . CHOLECYSTECTOMY      Review of Systems  Constitutional: Positive for fatigue. Negative for activity change and appetite change.  HENT: Positive for congestion, postnasal drip and rhinorrhea (Nasal discharge is sometimes clear, sometimes green.). Negative for ear pain and sore throat.   Eyes: Negative.  Negative for pain and visual disturbance.  Respiratory: Positive for cough (Productive: green mucus comes up with cough. ), chest tightness (When she lays down, she has chest tightness.), shortness of breath and wheezing.   Cardiovascular: Negative.  Negative for chest pain and palpitations.  Gastrointestinal: Negative.  Negative for constipation, diarrhea (Stool more loose than usual.), nausea and vomiting.  Genitourinary: Negative.  Negative for difficulty urinating and dysuria.  Musculoskeletal: Negative for arthralgias and myalgias.  Allergic/Immunologic: Positive for environmental allergies.  Neurological: Positive for headaches. Negative for light-headedness. Dizziness: Patient notes she has not been drinking alot of water.       Objective:   Physical Exam  Constitutional: She is oriented to person, place, and time. She appears well-developed and well-nourished.  BP 102/64 (BP Location: Left Arm, Patient Position: Sitting, Cuff Size: Normal)   Pulse 76   Temp 98.6 F (37 C) (Oral)   Ht 5\' 7"  (1.702 m)   Wt 194 lb 6.4 oz (88.2 kg)   LMP 05/20/2017   SpO2 99%   BMI 30.45 kg/m   HENT:  Head: Normocephalic and atraumatic.  Right Ear: External ear normal.  Left Ear: External ear normal.  Cobblestoning in oropharynx. Boggy, pale nasal turbinates.  Eyes: Conjunctivae  and EOM are normal. Pupils are equal, round, and reactive to light.  Neck: Normal range of motion. Neck supple.  Cardiovascular: Normal rate, regular rhythm, normal heart sounds and intact distal pulses. Exam reveals no gallop and no friction rub.  No murmur heard. Pulmonary/Chest: Effort normal and breath sounds normal. She has no wheezes. She has no rales.  Abdominal: Soft. Bowel sounds are normal. She exhibits no distension. There is no tenderness.  Lymphadenopathy:    She has no cervical adenopathy.  Neurological: She is alert and oriented to person, place, and time. She has normal reflexes.  Skin: Skin is warm and dry. No rash noted. She is not diaphoretic. No erythema. No pallor.  Psychiatric: She has a normal mood and affect. Her behavior is normal. Judgment and thought content  normal.   Office Spirometry Results: Peak Flow: 72 L/min FEV1: 2.77 liters FVC: 3.68 liters FEV1/FVC: 75.3 % FVC  % Predicted: 78 % FEV % Predicted: 72 % FeF 25-75: 2.22 liters FeF 25-75 % Predicted: 57   Assessment & Plan:   1. Cough 2. Rhinitis, unspecified type Patient presents with a chronic cough that has been present for 3 weeks. She states that her symptoms have improved since her last visit on 06/12/2017 but that she is still experiencing cough, rhinorrhea, congestion and fatigue. Patient has been tapering off Prednisone. PE was remarkable for cobblestoning of her oropharynx and pale, boggy nasal turbinates. Patient's spirometry results are normal. Managing patient's allergic rhinitis effectively should improve cough. Continue flonase. Adding Azelastine. Discussed adding nasal saline washes, oral decongestants and humidifier.  RTC precautions reviewed.  - Care order/instruction: Spirometry, Azelastine nasal spray  Follow up if symptoms fail to improve or worsen.  Patient was seen with PA-C student. Patient was independently seen and examined by me.

## 2017-06-22 NOTE — Patient Instructions (Addendum)
  1. solucion salina para la nariz 2-3 veces al dia 2. decongestionate nasal, sudafed o phenlyephrine   IF you received an x-ray today, you will receive an invoice from [facility]. Please contact [facility] at [phone] with questions or concerns regarding your invoice.   IF you received labwork today, you will receive an invoice from [facility]. Please contact [facility] at [phone] with questions or concerns regarding your invoice.   Our billing staff will not be able to assist you with questions regarding bills from these companies.  You will be contacted with the lab results as soon as they are available. The fastest way to get your results is to activate your My Chart account. Instructions are located on the last page of this paperwork. If you have not heard from [place] regarding the results in 2 weeks, please contact this office.

## 2017-06-22 NOTE — Progress Notes (Deleted)
3/7/20199:34 AM  Kim [name] 10/26/1970, 47 y.o. female [id number]  Chief Complaint  Patient presents with  . Follow-up    follow up on Pnuemonia visit. Having night time coughing and congestion. Feeling really fatiigue and having hairloss    HPI:   Patient is a 47 y.o. female with past medical history significant for *** who presents today for ***  Depression screen [facility] 2/9 06/22/2017 06/12/2017 06/08/2017  Decreased Interest 0 0 0  Down, Depressed, Hopeless 0 0 0  PHQ - 2 Score 0 0 0  Some recent data might be hidden    Allergies  Allergen Reactions  . Latex Itching and Rash    Itching with use of condoms    Prior to Admission medications   Medication Sig Start Date End Date Taking? Authorizing Provider  amoxicillin (AMOXIL) 500 MG tablet Take 2 tablets (1,000 mg total) by mouth 2 (two) times daily. 06/12/17  Yes Wardell Honour, MD  benzonatate (TESSALON) 100 MG capsule Take 1-2 capsules (100-200 mg total) by mouth 3 (three) times daily as needed for cough. 06/08/17  Yes Rutherford Guys, MD  fluticasone [facility]) 50 MCG/ACT nasal spray Place 2 sprays into both nostrils daily. 06/05/17  Yes Forrest Moron, MD  predniSONE (DELTASONE) 20 MG tablet Take 3 PO QAM x 1 day, 2 PO QAM x 5 days, 1 PO QAM x 5 days 06/12/17  Yes Wardell Honour, MD  Vitamin D, Ergocalciferol, (DRISDOL) 50000 units CAPS capsule Take 1 capsule (50,000 Units total) by mouth every 7 (seven) days. 06/05/17  Yes Princess Bruins, MD  albuterol (PROVENTIL HFA;VENTOLIN HFA) 108 (90 Base) MCG/ACT inhaler Inhale 2 puffs into the lungs every 6 (six) hours as needed for wheezing or shortness of breath. Patient not taking: Reported on 06/22/2017 06/12/17   Wardell Honour, MD  medroxyPROGESTERone (PROVERA) 5 MG tablet Take 1 tablet (5 mg total) by mouth daily. Every 3 months if no spontaneous period. 02/03/17 02/13/17  Princess Bruins, MD    Past Medical History:  Diagnosis Date  . Abnormal Pap smear      patient states she had cancer that was removed from her cervix  . Asthma    when pregnant/ once 6 yrs ago only time asthma attack the dr said  . Cancer ([place])   . Clotting disorder ([place])   . Gestational diabetes   . Intramural leiomyoma of uterus 02/29/2016  . Pulmonary embolism ([place])   . Renal insufficiency   . Vitamin D deficiency   . Vitamin D deficiency     Past Surgical History:  Procedure Laterality Date  . CHOLECYSTECTOMY      Social History   Tobacco Use  . Smoking status: Never Smoker  . Smokeless tobacco: Never Used  Substance Use Topics  . Alcohol use: Yes    Alcohol/week: 0.0 oz    Comment: Occasionally.    Family History  Problem Relation Age of Onset  . Diabetes Mother   . Hypertension Mother   . Hyperlipidemia Father   . Diabetes Father   . Hypertension Father   . Heart disease Sister   . Diabetes Brother   . Hyperlipidemia Brother   . Drug abuse Paternal Grandmother   . Kidney disease Other   . Birth defects Other        anal atresia/stenosis    ROS   OBJECTIVE:  Blood pressure 102/64, pulse 76, temperature 98.6 F (37 C), temperature source Oral, height 5\' 7"  (1.702  m), weight 194 lb 6.4 oz (88.2 kg), last menstrual period 05/20/2017, SpO2 99 %.  Physical Exam  No results found for this or any previous visit (from the past 24 hour(s)).  No results found.   ASSESSMENT and PLAN  ***  No Follow-up on file.    Rutherford Guys, MD Primary Care at [facility] [place], [place] [address] Ph.  [phone] Fax [phone]

## 2017-06-29 ENCOUNTER — Ambulatory Visit: Payer: BLUE CROSS/BLUE SHIELD

## 2017-07-11 ENCOUNTER — Other Ambulatory Visit: Payer: Self-pay | Admitting: Pediatrics

## 2017-07-11 DIAGNOSIS — Z2089 Contact with and (suspected) exposure to other communicable diseases: Secondary | ICD-10-CM

## 2017-07-11 DIAGNOSIS — Z207 Contact with and (suspected) exposure to pediculosis, acariasis and other infestations: Secondary | ICD-10-CM

## 2017-07-11 MED ORDER — PERMETHRIN 5 % EX CREA: 1.0000 "application " | TOPICAL_CREAM | Freq: Once | CUTANEOUS | 0 refills | Status: AC

## 2017-07-11 NOTE — Progress Notes (Signed)
Household contact seen today with scabies.  Entire household is being treated

## 2017-08-01 ENCOUNTER — Ambulatory Visit
Admission: RE | Admit: 2017-08-01 | Discharge: 2017-08-01 | Disposition: A | Discharge status: Home / Self Care | Payer: BLUE CROSS/BLUE SHIELD | Source: Ambulatory Visit | Attending: Obstetrics & Gynecology | Admitting: Obstetrics & Gynecology

## 2017-08-01 DIAGNOSIS — Z1231 Encounter for screening mammogram for malignant neoplasm of breast: Secondary | ICD-10-CM

## 2017-08-07 ENCOUNTER — Telehealth: Payer: Self-pay | Admitting: *Deleted

## 2017-08-07 ENCOUNTER — Other Ambulatory Visit: Payer: Self-pay | Admitting: Family Medicine

## 2017-08-07 MED ORDER — MEDROXYPROGESTERONE ACETATE 5 MG PO TABS: 5.0000 mg | ORAL_TABLET | Freq: Every day | ORAL | 3 refills | Status: DC

## 2017-08-07 NOTE — Telephone Encounter (Signed)
Pt spoke with Rosemarie Ax and needs refill on provera 5 mg to take every 3 months if no cycle per note in 02/03/17. Rx re-sent.

## 2017-08-09 ENCOUNTER — Other Ambulatory Visit: Payer: BLUE CROSS/BLUE SHIELD

## 2017-08-09 DIAGNOSIS — E559 Vitamin D deficiency, unspecified: Secondary | ICD-10-CM

## 2017-08-12 LAB — VITAMIN D 1,25 DIHYDROXY
Vitamin D 1, 25 (OH)2 Total: 43 pg/mL (ref 18–72)
Vitamin D2 1, 25 (OH)2: 25 pg/mL
Vitamin D3 1, 25 (OH)2: 18 pg/mL

## 2017-08-30 ENCOUNTER — Ambulatory Visit: Payer: BLUE CROSS/BLUE SHIELD | Admitting: Obstetrics & Gynecology

## 2017-08-31 ENCOUNTER — Other Ambulatory Visit: Payer: Self-pay | Admitting: Obstetrics & Gynecology

## 2017-09-04 ENCOUNTER — Other Ambulatory Visit: Payer: Self-pay | Admitting: Obstetrics & Gynecology

## 2017-09-05 ENCOUNTER — Ambulatory Visit: Payer: BLUE CROSS/BLUE SHIELD | Admitting: Obstetrics & Gynecology

## 2017-09-05 ENCOUNTER — Encounter: Payer: Self-pay | Admitting: Obstetrics & Gynecology

## 2017-09-05 VITALS — BP 134/78 | Ht 64.5 in | Wt 201.0 lb

## 2017-09-05 DIAGNOSIS — R6882 Decreased libido: Secondary | ICD-10-CM

## 2017-09-05 DIAGNOSIS — Z124 Encounter for screening for malignant neoplasm of cervix: Secondary | ICD-10-CM | POA: Diagnosis not present

## 2017-09-05 DIAGNOSIS — N72 Inflammatory disease of cervix uteri: Secondary | ICD-10-CM

## 2017-09-05 DIAGNOSIS — N914 Secondary oligomenorrhea: Secondary | ICD-10-CM | POA: Diagnosis not present

## 2017-09-05 DIAGNOSIS — B977 Papillomavirus as the cause of diseases classified elsewhere: Secondary | ICD-10-CM

## 2017-09-05 NOTE — Progress Notes (Signed)
    [place] 1970-10-08 [id number]        47 y.o.  [id number] married.  RP: History of high risk HPV for repeat Pap  HPI: Had ASCUS with negative high-risk HPV January 2018.  Then had a negative Pap test with positive high risk HPV in October 2018.  HPV 16, 18 and 45 negative.  Colposcopy November 2018 showed no dysplasia.  Oligomenorrhea on cyclic Provera for 10 days every 3 months.  Reinstructed patient to take the Provera for 10 days, as she said that she took it only 1 day and did not get a withdrawal bleeding this month.  Occasional sensation of bloating and cramps, but no pelvic pain.  Complains of low libido.  No severe hot flashes or night sweats.  Busy with 5 children.  No symptom of major depression, no suicidal ideation.  Recommend discussing any other issues with Fam MD.   OB History  Gravida Para Term Preterm AB Living  5 5 5     5   SAB TAB Ectopic Multiple Live Births        0 4    # Outcome Date GA Lbr Len/2nd Weight Sex Delivery Anes PTL Lv  5 Term 11/04/14 [redacted]w[redacted]d  7 lb 8.5 oz (3.416 kg) F Vag-Spont EPI  LIV  4 Term 03/18/08 [redacted]w[redacted]d    Vag-Spont  N LIV  3 Term 11/07/00 [redacted]w[redacted]d    Vag-Spont  N LIV  2 Term 07/09/90 [redacted]w[redacted]d    Vag-Spont  N LIV  1 Term 08/24/89 [redacted]w[redacted]d    Vag-Spont       Past medical history,surgical history, problem list, medications, allergies, family history and social history were all reviewed and documented in the EPIC chart.   Directed ROS with pertinent positives and negatives documented in the history of present illness/assessment and plan.  Exam:  Vitals:   09/05/17 1056  BP: 134/78  Weight: 201 lb (91.2 kg)  Height: 5' 4.5" (1.638 m)   General appearance:  Normal  Abdomen: Normal  Gynecologic exam: Vulva normal.  Speculum: Cervix normal.  Pap with high-risk HPV done.  Vagina normal.  Secretions normal.  Bimanual exam: Uterus anteverted, normal volume, nontender.  No adnexal mass, nontender.   Assessment/Plan:  47 y.o. [id number]    1. High risk human papilloma virus (HPV) infection of cervix Had ASCUS with negative high-risk HPV January 2018.  Then had a negative Pap test with positive high risk HPV in October 2018.  HPV 16, 18 and 45 negative.  Colposcopy November 2018 showed no dysplasia. Pap with HR HPV done today.  2. Secondary oligomenorrhea Cyclic Provera x 10 days every 3 months as needed.  Will r/o Menopause with [place] today. - FSH  3. Low libido Probably perimenopausal or menopausal.  Mother of 5 children.  No symptom of major depression, no suicidal ideation.  Declines counseling at this time. [facility]  Counseling on above issues and coordination of care more than 50% for 25 minutes.  Princess Bruins MD, 11:08 AM 09/05/2017

## 2017-09-06 LAB — FOLLICLE STIMULATING HORMONE: FSH: 52.4 m[IU]/mL

## 2017-09-07 ENCOUNTER — Encounter: Payer: Self-pay | Admitting: Family Medicine

## 2017-09-07 LAB — PAP, TP IMAGING W/ HPV RNA, RFLX HPV TYPE 16,18/45: HPV DNA High Risk: NOT DETECTED

## 2017-09-09 ENCOUNTER — Encounter: Payer: Self-pay | Admitting: Obstetrics & Gynecology

## 2017-09-09 NOTE — Patient Instructions (Signed)
1. High risk human papilloma virus (HPV) infection of cervix Had ASCUS with negative high-risk HPV January 2018.  Then had a negative Pap test with positive high risk HPV in October 2018.  HPV 16, 18 and 45 negative.  Colposcopy November 2018 showed no dysplasia. Pap with HR HPV done today.  2. Secondary oligomenorrhea Cyclic Provera x 10 days every 3 months as needed.  Will r/o Menopause with [place] today. - FSH  3. Low libido Probably perimenopausal or menopausal.  Mother of 5 children.  No symptom of major depression, no suicidal ideation.  Declines counseling at this time. - [place], it was a pleasure seeing you today!  I will inform you of your results as soon as they are available.

## 2017-09-19 ENCOUNTER — Telehealth: Payer: Self-pay

## 2017-09-19 NOTE — Telephone Encounter (Signed)
Yes, please schedule a visit for management of menopause/HRT.

## 2017-09-19 NOTE — Telephone Encounter (Signed)
[place] notified. She will call patient and schedule an appointment.

## 2017-09-19 NOTE — Telephone Encounter (Signed)
Patient stopped by the office and Claudia informed her of normal pap smear and FSH in menopausal range.    1.  She said you had prescribed Provera for her to take to bring on a period and questioned does she need to take it now. I advised her no, not to take it in light of Prien result. "2. Secondary oligomenorrhea Cyclic Provera x 10 days every 3 months as needed.  Will r/o Menopause with [place] today. - FSH"  2.  Patient complains of hot flashes and vaginal dryness with intercourse and said you mentioned possible HRT. I told her you may want a visit with her to discuss this but I would check with you to see what you recommend.

## 2017-09-20 ENCOUNTER — Encounter: Payer: Self-pay | Admitting: Obstetrics & Gynecology

## 2017-09-20 ENCOUNTER — Ambulatory Visit: Payer: BLUE CROSS/BLUE SHIELD | Admitting: Obstetrics & Gynecology

## 2017-09-20 VITALS — BP 126/78 | Ht 64.5 in | Wt 200.2 lb

## 2017-09-20 DIAGNOSIS — N951 Menopausal and female climacteric states: Secondary | ICD-10-CM

## 2017-09-20 DIAGNOSIS — E6609 Other obesity due to excess calories: Secondary | ICD-10-CM | POA: Diagnosis not present

## 2017-09-20 DIAGNOSIS — Z6832 Body mass index (BMI) 32.0-32.9, adult: Secondary | ICD-10-CM | POA: Diagnosis not present

## 2017-09-20 DIAGNOSIS — Z86711 Personal history of pulmonary embolism: Secondary | ICD-10-CM | POA: Diagnosis not present

## 2017-09-20 MED ORDER — ESCITALOPRAM OXALATE 10 MG PO TABS: 10.0000 mg | ORAL_TABLET | Freq: Every day | ORAL | 5 refills | Status: DC

## 2017-09-20 MED ORDER — PHENTERMINE HCL 37.5 MG PO CAPS: 37.5000 mg | ORAL_CAPSULE | ORAL | 2 refills | Status: DC

## 2017-09-20 NOTE — Progress Notes (Signed)
    [place] October 20, 1970 [id number]        47 y.o.  [id number] Married  RP: Symptomatic menopause to discuss management  HPI: Entering menopause with a Mar-Mac 52.4 on 09/05/17.  LMP 06/20/2017.  C/O Hot flushes and night sweats.  No pain with IC. Also c/o difficulty loosing weight, high appetite.  BMI 33.83.  Not regularly physically active.  H/O PE in 2016.  Per patient, thrombophilic work-up was negative.  Was anticoagulated for about 3 months.   OB History  Gravida Para Term Preterm AB Living  5 5 5     5   SAB TAB Ectopic Multiple Live Births        0 4    # Outcome Date GA Lbr Len/2nd Weight Sex Delivery Anes PTL Lv  5 Term 11/04/14 [redacted]w[redacted]d  7 lb 8.5 oz (3.416 kg) F Vag-Spont EPI  LIV  4 Term 03/18/08 [redacted]w[redacted]d    Vag-Spont  N LIV  3 Term 11/07/00 [redacted]w[redacted]d    Vag-Spont  N LIV  2 Term 07/09/90 [redacted]w[redacted]d    Vag-Spont  N LIV  1 Term 08/24/89 [redacted]w[redacted]d    Vag-Spont       Past medical history,surgical history, problem list, medications, allergies, family history and social history were all reviewed and documented in the EPIC chart.   Directed ROS with pertinent positives and negatives documented in the history of present illness/assessment and plan.  Exam:  Vitals:   09/20/17 1418  BP: 126/78   General appearance:  Normal  Deferred   Assessment/Plan:  47 y.o. [id number]   1. Menopausal syndrome Entering menopause with vasomotor menopausal symptoms.  History of pulmonary embolism in 2016.  Contraindication to hormone replacement therapy.  Treatment of vasomotor menopausal symptoms with an antidepressant reviewed.  Usage, risks and benefits reviewed.  Lexapro 10 mg 1 tablet per mouth daily prescribed.  Patient is worried that Lexapro will make it more difficult for her to lose weight.  May observe menopausal symptoms and start on Lexapro only if symptoms worsen.  2. Hx pulmonary embolism In 2016  3. Class 1 obesity due to excess calories without serious comorbidity with body mass index  (BMI) of 32.0 to 32.9 in adult Low calorie/low carb diet reviewed, such as [employer].  Increased physical activity recommended with aerobic activities 5 times a week and weightlifting every 2 days.  Will start on phentermine 37.5 mg 1 capsule daily for up to 3 months.  Patient's blood pressure is normal today.  Other orders - phentermine 37.5 MG capsule; Take 1 capsule (37.5 mg total) by mouth every morning. - escitalopram (LEXAPRO) 10 MG tablet; Take 1 tablet (10 mg total) by mouth daily.  Counseling on above issues and coordination of care more than 50% for 25 minutes.  Princess Bruins MD, 2:43 PM 09/20/2017

## 2017-09-23 ENCOUNTER — Encounter: Payer: Self-pay | Admitting: Obstetrics & Gynecology

## 2017-09-23 NOTE — Patient Instructions (Signed)
1. Menopausal syndrome Entering menopause with vasomotor menopausal symptoms.  History of pulmonary embolism in 2016.  Contraindication to hormone replacement therapy.  Treatment of vasomotor menopausal symptoms with an antidepressant reviewed.  Usage, risks and benefits reviewed.  Lexapro 10 mg 1 tablet per mouth daily prescribed.  Patient is worried that Lexapro will make it more difficult for her to lose weight.  May observe menopausal symptoms and start on Lexapro only if symptoms worsen.  2. Hx pulmonary embolism In 2016  3. Class 1 obesity due to excess calories without serious comorbidity with body mass index (BMI) of 32.0 to 32.9 in adult Low calorie/low carb diet reviewed, such as [employer].  Increased physical activity recommended with aerobic activities 5 times a week and weightlifting every 2 days.  Will start on phentermine 37.5 mg 1 capsule daily for up to 3 months.  Patient's blood pressure is normal today.  Other orders - phentermine 37.5 MG capsule; Take 1 capsule (37.5 mg total) by mouth every morning. - escitalopram (LEXAPRO) 10 MG tablet; Take 1 tablet (10 mg total) by mouth daily.  [place], fue un placer verle hoy!

## 2017-09-26 ENCOUNTER — Other Ambulatory Visit: Payer: Self-pay | Admitting: Obstetrics & Gynecology

## 2017-09-26 ENCOUNTER — Ambulatory Visit (INDEPENDENT_AMBULATORY_CARE_PROVIDER_SITE_OTHER): Payer: BLUE CROSS/BLUE SHIELD | Admitting: Emergency Medicine

## 2017-09-26 ENCOUNTER — Ambulatory Visit: Payer: BLUE CROSS/BLUE SHIELD

## 2017-09-26 ENCOUNTER — Other Ambulatory Visit: Payer: Self-pay

## 2017-09-26 ENCOUNTER — Encounter: Payer: Self-pay | Admitting: Emergency Medicine

## 2017-09-26 VITALS — BP 100/70 | HR 77 | Temp 98.3°F | Resp 16 | Ht 65.0 in | Wt 202.2 lb

## 2017-09-26 DIAGNOSIS — L089 Local infection of the skin and subcutaneous tissue, unspecified: Secondary | ICD-10-CM

## 2017-09-26 DIAGNOSIS — S90561A Insect bite (nonvenomous), right ankle, initial encounter: Secondary | ICD-10-CM | POA: Diagnosis not present

## 2017-09-26 DIAGNOSIS — W57XXXA Bitten or stung by nonvenomous insect and other nonvenomous arthropods, initial encounter: Secondary | ICD-10-CM

## 2017-09-26 MED ORDER — CEPHALEXIN 500 MG PO CAPS: 500.0000 mg | ORAL_CAPSULE | Freq: Three times a day (TID) | ORAL | 0 refills | Status: AC

## 2017-09-26 NOTE — Progress Notes (Signed)
[facility] 47 y.o.   Chief Complaint  Patient presents with  . Insect Bite    x 7 days -RIGHT leg ankle with redness and knot    HISTORY OF PRESENT ILLNESS: [name] complaining of possible spider bite to her right ankle area that happened about 7 days ago.  Complaining of redness and swelling to the area.  Denies systemic symptoms.  Denies fever or chills.  Denies nausea or vomiting.  Denies hives.  No other significant symptoms.  HPI   Prior to Admission medications   Medication Sig Start Date End Date Taking? Authorizing Provider  azelastine (ASTELIN) 0.1 % nasal spray Place 1 spray into both nostrils 2 (two) times daily. Use in each nostril as directed 06/22/17  Yes Rutherford Guys, MD  escitalopram (LEXAPRO) 10 MG tablet Take 1 tablet (10 mg total) by mouth daily. 09/20/17  Yes Princess Bruins, MD  fluticasone (FLONASE) 50 MCG/ACT nasal spray Place 1 spray into both nostrils 2 (two) times daily. 06/22/17  Yes Rutherford Guys, MD  phentermine 37.5 MG capsule Take 1 capsule (37.5 mg total) by mouth every morning. 09/20/17  Yes Princess Bruins, MD  PROAIR HFA 108 [phone] Base) MCG/ACT inhaler PLEASE SEE ATTACHED FOR DETAILED DIRECTIONS 08/07/17  Yes Wardell Honour, MD  medroxyPROGESTERone (PROVERA) 5 MG tablet Take 1 tablet (5 mg total) by mouth daily for 10 days. Every 3 months if no spontaneous period. 08/07/17 08/17/17  Princess Bruins, MD    Allergies  Allergen Reactions  . Latex Itching and Rash    Itching with use of condoms    Patient Active Problem List   Diagnosis Date Noted  . Acute laryngitis 05/17/2017  . Skin lesion of back 02/13/2017  . Sore throat 10/27/2016  . Intramural leiomyoma of uterus 02/29/2016  . GERD (gastroesophageal reflux disease) 10/19/2015  . History of pulmonary embolus (PE) 02/24/2015  . Hx of pulmonary embolus during pregnancy 11/09/2014    Past Medical History:  Diagnosis Date  . Abnormal Pap smear    patient  states she had cancer that was removed from her cervix  . Asthma    when pregnant/ once 6 yrs ago only time asthma attack the dr said  . Cancer ([place])   . Clotting disorder ([place])   . Gestational diabetes   . Intramural leiomyoma of uterus 02/29/2016  . Pulmonary embolism ([place])   . Renal insufficiency   . Vitamin D deficiency   . Vitamin D deficiency     Past Surgical History:  Procedure Laterality Date  . CHOLECYSTECTOMY      Social History   Socioeconomic History  . Marital status: Married    Spouse name: Not on file  . Number of children: 5  . Years of education: Not on file  . Highest education level: Not on file  Occupational History  . Occupation: Stay at [employer]  . Financial resource strain: Not on file  . Food insecurity:    Worry: Not on file    Inability: Not on file  . Transportation needs:    Medical: Not on file    Non-medical: Not on file  Tobacco Use  . Smoking status: Never Smoker  . Smokeless tobacco: Never Used  Substance and Sexual Activity  . Alcohol use: Yes    Alcohol/week: 0.0 oz    Comment: Occasionally.  . Drug use: No  . Sexual activity: Yes    Partners: Male  Birth control/protection: Condom  Lifestyle  . Physical activity:    Days per week: Not on file    Minutes per session: Not on file  . Stress: Not on file  Relationships  . Social connections:    Talks on phone: Not on file    Gets together: Not on file    Attends religious service: Not on file    Active member of club or organization: Not on file    Attends meetings of clubs or organizations: Not on file    Relationship status: Not on file  . Intimate partner violence:    Fear of current or ex partner: Not on file    Emotionally abused: Not on file    Physically abused: Not on file    Forced sexual activity: Not on file  Other Topics Concern  . Not on file  Social History Narrative   Married with five children. Came to [place] from [place] in 2001.  Unemployed currently. Spanish speaking.    Family History  Problem Relation Age of Onset  . Diabetes Mother   . Hypertension Mother   . Hyperlipidemia Father   . Diabetes Father   . Hypertension Father   . Heart disease Sister   . Diabetes Brother   . Hyperlipidemia Brother   . Drug abuse Paternal Grandmother   . Kidney disease Other   . Birth defects Other        anal atresia/stenosis     Review of Systems  Constitutional: Negative.  Negative for chills and fever.  HENT: Negative.  Negative for sore throat.   Eyes: Negative.  Negative for blurred vision and double vision.  Respiratory: Negative.  Negative for cough and shortness of breath.   Cardiovascular: Negative.  Negative for chest pain and palpitations.  Gastrointestinal: Negative.  Negative for abdominal pain, diarrhea, nausea and vomiting.  Genitourinary: Negative.  Negative for hematuria.  Musculoskeletal: Negative.  Negative for back pain, myalgias and neck pain.  Skin: Positive for rash.  Neurological: Negative.  Negative for dizziness and headaches.  Endo/Heme/Allergies: Negative.   All other systems reviewed and are negative.  Vitals:   09/26/17 1134  BP: 100/70  Pulse: 77  Resp: 16  Temp: 98.3 F (36.8 C)  SpO2: 99%     Physical Exam  Constitutional: She is oriented to person, place, and time. She appears well-developed and well-nourished.  HENT:  Head: Normocephalic and atraumatic.  Eyes: Pupils are equal, round, and reactive to light. EOM are normal.  Neck: Normal range of motion. Neck supple.  Cardiovascular: Normal rate and regular rhythm.  Pulmonary/Chest: Effort normal and breath sounds normal.  Musculoskeletal: Normal range of motion.  Neurological: She is alert and oriented to person, place, and time.  Skin: Skin is warm and dry.  As pictured below  Vitals reviewed.  .  A total of 25 minutes was spent in the room with the patient, greater than 50% of which was in  counseling/coordination of care regarding differential diagnosis, management, medications, prognosis, and need for follow-up if no better or worse.   ASSESSMENT & PLAN: Kim [name] was seen today for insect bite.  Diagnoses and all orders for this visit:  Skin infection -     cephALEXin (KEFLEX) 500 MG capsule; Take 1 capsule (500 mg total) by mouth 3 (three) times daily for 7 days.  Insect bite of right ankle, initial encounter    Patient Instructions       IF you received an x-ray today, you  will receive an invoice from [facility]. Please contact [facility] at [phone] with questions or concerns regarding your invoice.   IF you received labwork today, you will receive an invoice from [facility]. Please contact [facility] at [phone] with questions or concerns regarding your invoice.   Our billing staff will not be able to assist you with questions regarding bills from these companies.  You will be contacted with the lab results as soon as they are available. The fastest way to get your results is to activate your My Chart account. Instructions are located on the last page of this paperwork. If you have not heard from [place] regarding the results in 2 weeks, please contact this office.     Picadura de insectos Chief Strategy Officer) Los mosquitos, las moscas, las [place], las chinches y muchos otros insectos pueden Engineer, agricultural. Las picaduras de insectos son diferentes si tienen aguijn. La picadura puede estar roja, inflamada (hinchada) y picar durante 2 a 4 das. La mayora de las picaduras mejorarn sin tratamiento. CUIDADOS EN EL HOGAR  No se rasque la picadura.  Mantenga la zona limpia y seca. Lave la picadura con agua y [employer] se lo haya indicado el mdico.  Si se lo indican, aplique hielo sobre la zona de la picadura. ? Ponga el hielo en una bolsa plstica. ? Coloque una [employer] piel y la bolsa de hielo. ? Coloque el hielo durante  69minutos, 2 a 3veces por da.  Siga las indicaciones del mdico respecto del uso de lociones o cremas medicinales. Estos productos pueden Barrister's clerk.  Aplquese o tome los medicamentos de venta libre y los recetados solamente como se lo haya indicado el mdico.  Si le dieron un antibitico, selo como se lo haya indicado el mdico. No deje de usar el medicamento aunque la afeccin mejore.  Concurra a todas las visitas de control como se lo haya indicado el mdico. Esto es importante. SOLICITE AYUDA SI:  Tiene enrojecimiento, hinchazn (inflamacin) o dolor cerca de la picadura, y estos sntomas empeoran.  Tiene fiebre. SOLICITE AYUDA DE INMEDIATO SI:  Siente dolor en las articulaciones.  Emana lquido, sangre o pus de la zona de la picadura.  Tiene dolores de [place].  Siente dolor en el cuello.  Se siente ms dbil que de costumbre.  Tiene una erupcin cutnea.  Siente dolor en el pecho.  Le falta el aire.  Siente dolor de [place], Higher education careers adviser (nuseas) o vomita.  Se siente ms cansado o somnoliento que lo habitual. Esta informacin no tiene Marine scientist el consejo del mdico. Asegrese de hacerle al mdico cualquier pregunta que tenga. Document Released: 04/04/2005 Document Revised: 12/24/2014 Document Reviewed: 08/20/2014 Elsevier Interactive Patient Education  2018 Elsevier Inc.      Agustina Caroli, MD [facility]

## 2017-09-26 NOTE — Patient Instructions (Addendum)
     IF you received an x-ray today, you will receive an invoice from [facility]. Please contact [facility] at [phone] with questions or concerns regarding your invoice.   IF you received labwork today, you will receive an invoice from [place]. Please contact [facility] at [phone] with questions or concerns regarding your invoice.   Our billing staff will not be able to assist you with questions regarding bills from these companies.  You will be contacted with the lab results as soon as they are available. The fastest way to get your results is to activate your My Chart account. Instructions are located on the last page of this paperwork. If you have not heard from [place] regarding the results in 2 weeks, please contact this office.     Picadura de insectos Chief Strategy Officer) Los mosquitos, las moscas, las [place], las chinches y muchos otros insectos pueden Engineer, agricultural. Las picaduras de insectos son diferentes si tienen aguijn. La picadura puede estar roja, inflamada (hinchada) y picar durante 2 a 4 das. La mayora de las picaduras mejorarn sin tratamiento. CUIDADOS EN EL HOGAR  No se rasque la picadura.  Mantenga la zona limpia y seca. Lave la picadura con agua y [employer] se lo haya indicado el mdico.  Si se lo indican, aplique hielo sobre la zona de la picadura. ? Ponga el hielo en una bolsa plstica. ? Coloque una [employer] piel y la bolsa de hielo. ? Coloque el hielo durante 69minutos, 2 a 3veces por da.  Siga las indicaciones del mdico respecto del uso de lociones o cremas medicinales. Estos productos pueden Barrister's clerk.  Aplquese o tome los medicamentos de venta libre y los recetados solamente como se lo haya indicado el mdico.  Si le dieron un antibitico, selo como se lo haya indicado el mdico. No deje de usar el medicamento aunque la afeccin mejore.  Concurra a todas las visitas de control como se lo haya indicado el  mdico. Esto es importante. SOLICITE AYUDA SI:  Tiene enrojecimiento, hinchazn (inflamacin) o dolor cerca de la picadura, y estos sntomas empeoran.  Tiene fiebre. SOLICITE AYUDA DE INMEDIATO SI:  Siente dolor en las articulaciones.  Emana lquido, sangre o pus de la zona de la picadura.  Tiene dolores de [place].  Siente dolor en el cuello.  Se siente ms dbil que de costumbre.  Tiene una erupcin cutnea.  Siente dolor en el pecho.  Le falta el aire.  Siente dolor de [place], Higher education careers adviser (nuseas) o vomita.  Se siente ms cansado o somnoliento que lo habitual. Esta informacin no tiene Marine scientist el consejo del mdico. Asegrese de hacerle al mdico cualquier pregunta que tenga. Document Released: 04/04/2005 Document Revised: 12/24/2014 Document Reviewed: 08/20/2014 Elsevier Interactive Patient Education  2018 [employer].

## 2017-10-11 ENCOUNTER — Encounter: Payer: Self-pay | Admitting: Women's Health

## 2017-10-11 ENCOUNTER — Ambulatory Visit: Payer: BLUE CROSS/BLUE SHIELD | Admitting: Women's Health

## 2017-10-11 VITALS — BP 124/78

## 2017-10-11 DIAGNOSIS — R300 Dysuria: Secondary | ICD-10-CM | POA: Diagnosis not present

## 2017-10-11 DIAGNOSIS — N760 Acute vaginitis: Secondary | ICD-10-CM

## 2017-10-11 DIAGNOSIS — B9689 Other specified bacterial agents as the cause of diseases classified elsewhere: Secondary | ICD-10-CM | POA: Diagnosis not present

## 2017-10-11 LAB — WET PREP FOR TRICH, YEAST, CLUE

## 2017-10-11 MED ORDER — METRONIDAZOLE 500 MG PO TABS: 500.0000 mg | ORAL_TABLET | Freq: Two times a day (BID) | ORAL | 0 refills | Status: DC

## 2017-10-11 NOTE — Patient Instructions (Signed)
Vaginosis bacteriana (Bacterial Vaginosis) La vaginosis bacteriana es una infeccin de la vagina. Se produce cuando crece una cantidad excesiva de grmenes normales (bacterias sanas) en la vagina. Esta infeccin aumenta el riesgo de contraer otras infecciones de transmisin sexual. El tratamiento de esta infeccin puede ayudar a reducir el riesgo de otras infecciones, como:  Clamidia.  Roderick Pee.  VIH.  Herpes. [place] los medicamentos tal como se lo indic su mdico.  Finalice la prescripcin completa, aunque comience a sentirse mejor.  Comunique a sus compaeros sexuales que sufre una infeccin. Deben consultar a su mdico para iniciar un tratamiento.  Durante el tratamiento: ? Teacher, music o use preservativos de Cabin crew. ? No se haga duchas vaginales. ? No consuma alcohol a menos que el mdico lo autorice. ? No amamante a menos que el mdico la autorice.  SOLICITE AYUDA SI:  No mejora luego de 3 das de tratamiento.  Observa una secrecin (prdida) de color gris ms abundante que proviene de la vagina.  Siente ms dolor que antes.  Tiene fiebre.  ASEGRESE DE QUE:  Comprende estas instrucciones.  Controlar su afeccin.  Recibir ayuda de inmediato si no mejora o si empeora.  Esta informacin no tiene Marine scientist el consejo del mdico. Asegrese de hacerle al mdico cualquier pregunta que tenga. Document Released: 07/01/2008 Document Revised: 07/27/2015 Document Reviewed: 11/14/2012 Elsevier Interactive Patient Education  2017 Elsevier Inc. Bacterial Vaginosis Bacterial vaginosis is an infection of the vagina. It happens when too many germs (bacteria) grow in the vagina. This infection puts you at risk for infections from sex (STIs). Treating this infection can lower your risk for some STIs. You should also treat this if you are pregnant. It can cause your baby to be born early. Follow these  instructions at home: Medicines  Take over-the-counter and prescription medicines only as told by your doctor.  Take or use your antibiotic medicine as told by your doctor. Do not stop taking or using it even if you start to feel better. General instructions  If you your sexual partner is a woman, tell her that you have this infection. She needs to get treatment if she has symptoms. If you have a female partner, he does not need to be treated.  During treatment: ? Avoid sex. ? Do not douche. ? Avoid alcohol as told. ? Avoid breastfeeding as told.  Drink enough fluid to keep your pee (urine) clear or pale yellow.  Keep your vagina and butt (rectum) clean. ? Wash the area with warm water every day. ? Wipe from front to back after you use the toilet.  Keep all follow-up visits as told by your doctor. This is important. Preventing this condition  Do not douche.  Use only warm water to wash around your vagina.  Use protection when you have sex. This includes: ? Latex condoms. ? Dental dams.  Limit how many people you have sex with. It is best to only have sex with the same person (be monogamous).  Get tested for STIs. Have your partner get tested.  Wear underwear that is cotton or lined with cotton.  Avoid tight pants and pantyhose. This is most important in summer.  Do not use any products that have nicotine or tobacco in them. These include cigarettes and e-cigarettes. If you need help quitting, ask your doctor.  Do not use illegal drugs.  Limit how much alcohol you drink. Contact a doctor if:  Your symptoms do not get better, even after you are treated.  You have more discharge or pain when you pee (urinate).  You have a fever.  You have pain in your belly (abdomen).  You have pain with sex.  Your bleed from your vagina between periods. Summary  This infection happens when too many germs (bacteria) grow in the vagina.  Treating this condition can lower your  risk for some infections from sex (STIs).  You should also treat this if you are pregnant. It can cause early (premature) birth.  Do not stop taking or using your antibiotic medicine even if you start to feel better. This information is not intended to replace advice given to you by your health care provider. Make sure you discuss any questions you have with your health care provider. Document Released: 01/12/2008 Document Revised: 12/19/2015 Document Reviewed: 12/19/2015 Elsevier Interactive Patient Education  2017 [employer].

## 2017-10-11 NOTE — Progress Notes (Signed)
47 y/o MHF G5P5 presents with complaint of vaginal itching with irritation, burning with urination, and right lower back pain for 2 weeks. Denies vaginal discharge or odor, urinary frequency, urgency, or fever. Used over-the-counter 7-day vaginal cream with no relief of symptoms, completed course on 10/10/2017. LMP 3 months ago.Sexually active with spouse with occasional condom use and withdrawal. Significant history includes kidney stones and gallbladder removal.  Exam: Appears well. Mild CVA tenderness. External genitalia with erythema. Speculum exam scant white discharge with no odor. Wet prep positive for clue cells, many bacteria.  Urinalysis, blood 1+, nitrites negative, leukocytes 1+, WBC 6-10, RBC 0-2, bacteria moderate,  few clue cells  Bacterial vaginosis Perimenopausal  Plan: Discussed wet prep and UA results. Review UTI and  bacterial vaginosis prevention. Metronidazole 500 mg by mouth 2 times daily for 7 days. Proper use given, avoid alcohol during treatment. Urine culture pending. Will call if culture positive. Return to clinic if symptoms do not begin to improve with antibiotics.  Encouraged over-the-counter vaginal lubricants with intercourse.

## 2017-10-12 ENCOUNTER — Other Ambulatory Visit: Payer: Self-pay | Admitting: Women's Health

## 2017-10-13 ENCOUNTER — Other Ambulatory Visit: Payer: Self-pay | Admitting: Anesthesiology

## 2017-10-13 ENCOUNTER — Other Ambulatory Visit: Payer: Self-pay | Admitting: Women's Health

## 2017-10-13 ENCOUNTER — Other Ambulatory Visit: Payer: Self-pay | Admitting: Obstetrics & Gynecology

## 2017-10-13 LAB — URINE CULTURE
MICRO NUMBER:: 90768697
SPECIMEN QUALITY:: ADEQUATE

## 2017-10-13 LAB — URINALYSIS, COMPLETE W/RFL CULTURE
Bilirubin Urine: NEGATIVE
Glucose, UA: NEGATIVE
Hyaline Cast: NONE SEEN /LPF
Nitrites, Initial: NEGATIVE
Protein, ur: NEGATIVE
Specific Gravity, Urine: 1.02 (ref 1.001–1.03)
pH: 5.5 (ref 5.0–8.0)

## 2017-10-13 LAB — CULTURE INDICATED

## 2017-10-13 MED ORDER — AMOXICILLIN 500 MG PO CAPS: 500.0000 mg | ORAL_CAPSULE | Freq: Three times a day (TID) | ORAL | 0 refills | Status: DC

## 2017-10-23 ENCOUNTER — Encounter: Payer: Self-pay | Admitting: Obstetrics & Gynecology

## 2017-10-23 ENCOUNTER — Ambulatory Visit: Payer: BLUE CROSS/BLUE SHIELD | Admitting: Obstetrics & Gynecology

## 2017-10-23 VITALS — BP 126/84 | Ht 64.5 in | Wt 195.0 lb

## 2017-10-23 DIAGNOSIS — Z6832 Body mass index (BMI) 32.0-32.9, adult: Secondary | ICD-10-CM

## 2017-10-23 DIAGNOSIS — Z01419 Encounter for gynecological examination (general) (routine) without abnormal findings: Secondary | ICD-10-CM | POA: Diagnosis not present

## 2017-10-23 DIAGNOSIS — Z780 Asymptomatic menopausal state: Secondary | ICD-10-CM | POA: Diagnosis not present

## 2017-10-23 DIAGNOSIS — E6609 Other obesity due to excess calories: Secondary | ICD-10-CM | POA: Diagnosis not present

## 2017-10-23 NOTE — Progress Notes (Signed)
[place] 03-02-1971 [id number]   History:    47 y.o. G5P5L5 Married  RP:  Established patient presenting for annual gyn exam   HPI: Menopause, well on no HRT.  No PMB.  Vasomotor Sxs improving.  CI to HRT because of H/O PE.  Didn't start Lexapro prescribed last visit in 09/2017 because getting better without treatment and is trying to loose weight.  On Phentermine with a low calorie/carb diet and regular physical activity.  Weight similar per patient x last visit.  No pelvic pain.  No pain with IC.  Urine/BMs wnl.  Breasts wnl.  F/U here for Fasting Health Labs.  Past medical history,surgical history, family history and social history were all reviewed and documented in the EPIC chart.  Gynecologic History Patient's last menstrual period was 10/18/2017. Contraception: post menopausal status Last Pap: 08/2017. Results were: Negative, HPV HR neg (Pap 04/2016 ASCUS/HPV HR neg) Last mammogram: 07/2017. Results were: Negative Bone Density: Never Colonoscopy: Never  Obstetric History OB History  Gravida Para Term Preterm AB Living  _0 SAB TAB Ectopic Multiple Live Births        0 4    # Outcome Date GA Lbr Len/2nd Weight Sex Delivery Anes PTL Lv  5 Term 11/04/14 [redacted]w[redacted]d 7 lb 8.5 oz (3.416 kg) F Vag-Spont EPI  LIV  4 Term 03/18/08 464w0d  Vag-Spont  N LIV  3 Term 11/07/00 [id number] Vag-Spont  N LIV  2 Term 07/09/90 [id number]-Spont  N LIV  1 Term 08/24/89 [id number]-Spont        ROS: A ROS was performed and pertinent positives and negatives are included in the history.  GENERAL: No fevers or chills. HEENT: No change in vision, no earache, sore throat or sinus congestion. NECK: No pain or stiffness. CARDIOVASCULAR: No chest pain or pressure. No palpitations. PULMONARY: No shortness of breath, cough or wheeze. GASTROINTESTINAL: No abdominal pain, nausea, vomiting or diarrhea, melena or bright red blood per rectum. GENITOURINARY: No urinary frequency, urgency,  hesitancy or dysuria. MUSCULOSKELETAL: No joint or muscle pain, no back pain, no recent trauma. DERMATOLOGIC: No rash, no itching, no lesions. ENDOCRINE: No polyuria, polydipsia, no heat or cold intolerance. No recent change in weight. HEMATOLOGICAL: No anemia or easy bruising or bleeding. NEUROLOGIC: No headache, seizures, numbness, tingling or weakness. PSYCHIATRIC: No depression, no loss of interest in normal activity or change in sleep pattern.     Exam:   BP 126/84   Ht 5' 4.5" (1.638 m)   Wt 195 lb (88.5 kg)   LMP 10/18/2017   BMI 32.95 kg/m   Body mass index is 32.95 kg/m.  General appearance : Well developed well nourished female. No acute distress HEENT: Eyes: no retinal hemorrhage or exudates,  Neck supple, trachea midline, no carotid bruits, no thyroidmegaly Lungs: Clear to auscultation, no rhonchi or wheezes, or rib retractions  Heart: Regular rate and rhythm, no murmurs or gallops Breast:Examined in sitting and supine position were symmetrical in appearance, no palpable masses or tenderness,  no skin retraction, no nipple inversion, no nipple discharge, no skin discoloration, no axillary or supraclavicular lymphadenopathy Abdomen: no palpable masses or tenderness, no rebound or guarding Extremities: no edema or skin discoloration or tenderness  Pelvic: Vulva: Normal             Vagina: No gross lesions or discharge  Cervix: No gross  lesions or discharge  Uterus  AV, normal size, shape and consistency, non-tender and mobile  Adnexa  Without masses or tenderness  Anus: Normal   Assessment/Plan:  47 y.o. female for annual exam   1. Well female exam with routine gynecological exam Normal gynecologic exam.  Pap test was negative with negative high-risk HPV in May 2019.  Breast exam normal.  Last screening mammogram in April 2019 was negative.  Recent vitamin D normal.  Will follow-up here for fasting health labs. - CBC; Future - Lipid panel; Future - Comp Met (CMET);  Future - TSH; Future  2. Menopause present Menopause, well on no hormone replacement therapy.  No postmenopausal bleeding.  Taking vitamin D supplements, calcium rich nutrition and doing regular weightbearing physical activity.  3. Class 1 obesity due to excess calories without serious comorbidity with body mass index (BMI) of 32.0 to 32.9 in adult Body mass index 32.95.  Per patient, no weight loss since last visit early June 2019.  Will continue Phentermine.  Patient on a low calorie/carb diet and doing regular physical activity.  [employer] is a good example of low calorie/low carb diet.  Recommend physical activity with aerobic activities 5 times a week and weightlifting every 2 days.  Will follow-up for weight management visit in 3 months.  Counseling on above issues and coordination of care more than 50% for 10 minutes.   Princess Bruins MD, 12:21 PM 10/23/2017

## 2017-10-23 NOTE — Patient Instructions (Addendum)
1. Well female exam with routine gynecological exam Normal gynecologic exam.  Pap test was negative with negative high-risk HPV in May 2019.  Breast exam normal.  Last screening mammogram in April 2019 was negative.  Recent vitamin D normal.  Will follow-up here for fasting health labs. - CBC; Future - Lipid panel; Future - Comp Met (CMET); Future - TSH; Future  2. Menopause present Menopause, well on no hormone replacement therapy.  No postmenopausal bleeding.  Taking vitamin D supplements, calcium rich nutrition and doing regular weightbearing physical activity.  3. Class 1 obesity due to excess calories without serious comorbidity with body mass index (BMI) of 32.0 to 32.9 in adult Body mass index 32.95.  Per patient, no weight loss since last visit early June 2019.  Will continue Phentermine.  Patient on a low calorie/carb diet and doing regular physical activity.  [employer] is a good example of low calorie/low carb diet.  Recommend physical activity with aerobic activities 5 times a week and weightlifting every 2 days.  Will follow-up for weight management visit in 3 months.   Recuento de caloras para bajar de peso Calorie Counting for [employer] Loss Las caloras son unidades de Teacher, early years/pre. Su cuerpo necesita una cierta cantidad de caloras de los alimentos para que le ayuden a Company secretary. Cuando come ms caloras de las que el cuerpo necesita, este acumula las caloras extra [place]. Cuando come [employer] de las que el cuerpo [place], este quema grasa para obtener la energa que requiere. El recuento de caloras es el registro de la cantidad de caloras que come y Pharmacologist. El recuento de caloras puede ser de ayuda si necesita perder peso. Si se asegura de comer menos caloras de las que el cuerpo necesita, debe bajar de [place]. Pregntele al mdico cul es un peso sano para usted. Para que el recuento de caloras funcione, tendr que comer la cantidad de caloras adecuadas  para usted en un da, para bajar una cantidad de peso saludable por semana. Un nutricionista puede determinar la cantidad de caloras que necesita por da y sugerirle cmo alcanzar su objetivo calrico.  Una cantidad de peso saludable para bajar por semana suele ser [place] 1 y Ivar Drape (0,5 a 0,9kg). Esto significa con frecuencia que su ingesta diaria de caloras se debera reducir unas 500 a 750caloras.  Ingerir 1200 a 1500caloras por Administrator, Civil Service a la [place] a Administrator, Civil Service.  Ingerir de 1500 a 1800caloras por Administrator, Civil Service a la [employer] de los hombres a Administrator, Civil Service.  En qu consiste el plan? Mi objetivo es comer __________ Raenette Rover da. Si como esta cantidad de caloras por da, debo bajar unas __________ Terrall Laity. Qu debo saber acerca del recuento de caloras? A fin de alcanzar su objetivo diario de caloras, tendr que:  Averiguar cuntas caloras hay en cada alimento que le Therapist, occupational. Intente hacerlo antes de comer.  Decida la cantidad que puede comer del alimento.  Anote lo que comi y cuntas caloras tena. Esta tarea se conoce como llevar un registro de comidas.  Para perder peso con xito es importante equilibrar el recuento de caloras con un estilo de vida saludable que incluya actividad fsica de forma regular. Tenga un objetivo de 175mnutos de ejercicio moderado (como caminar) o 75 minutos de ejercicio vigoroso (como correr) todas las semanas. Dnde encuentro informacin sobre las caloras?  Es posible encontrar la cantidad de caloras que contiene un alimento en  la etiqueta de informacin nutricional. Si un alimento no tiene una etiqueta de informacin nutricional, intente buscar las caloras en Internet o pida ayuda al nutricionista. Recuerde que las caloras se calculan por porcin. Si opta por comer ms de una porcin de un alimento, tendr [employer] las caloras por porcin por la cantidad de porciones que planea  comer. Por ejemplo, la etiqueta de un envase de pan puede decir que el tamao de una porcin es [place], y que una porcin tiene 90caloras. Si come 1rodaja, habr comido 90caloras. Si come 2rodajas, habr comido 180caloras. Cmo llevo un registro de comidas? Despus de cada comida, registre la siguiente informacin en el registro de comidas:  Lo que comi. No olvide incluir los aderezos, las salsas y otros extras de la comida.  La cantidad que comi. Esto se puede medir en tazas, onzas o cantidad de alimentos.  Cuntas caloras ingiri por comida y por bebida.  La cantidad total de caloras en la comida.  Tenga a Materials engineer de comidas, por ejemplo, en un anotador de bolsillo o utilice una aplicacin mvil o sitio web. Algunos programas calcularn las caloras y Automotive engineer la cantidad de caloras que le quedan para llegar al objetivo diario. Cules son algunos consejos para el recuento de caloras?  Use las caloras de los alimentos y las bebidas que lo sacien y no lo dejen con apetito: ? Algunos ejemplos de alimentos que lo sacian son los frutos secos y Engineer, mining de frutos secos, verduras, Advertising account planner y Clinical research associate con alto contenido de Pharmacist, hospital como los cereales integrales. Los alimentos con alto contenido de [place] son aquellos que tienen ms de 5g de fibra por porcin. ? Las [employer] refrescos, especialmente las bebidas a base de caf y los jugos, que contienen muchas caloras, pero no le dan saciedad.  Coma alimentos nutritivos y evite las caloras vacas. Las caloras vacas son aquellas que se obtienen de los alimentos o las bebidas que no contienen muchos nutrientes ni protenas, como los dulces y los refrescos. Es mejor comer una comida nutritiva altamente calrica (como un aguacate) que una con pocos nutrientes (como una bolsa de patatas fritas).  Sepa cuntas caloras tienen los alimentos que come con ms frecuencia. Esto le ayudar a contar las caloras  ms rpidamente.  Preste atencin a las [employer]. Las bebidas de bajas caloras incluyen agua y refrescos sin Location manager.  Preste atencin a las etiquetas nutricionales de alimentos "bajos en grasas" o "sin grasas". Estos alimentos a veces tienen la misma cantidad de caloras o ms caloras que las versiones ricas en grasa. Con frecuencia, tambin tienen agregados de azcar, almidn o sal, para darles el sabor que fue eliminado con la grasa.  Encuentre un mtodo para controlar las caloras que funcione para usted. Sea creativo. Pruebe aplicaciones o programas distintos, si llevar un registro de las caloras no funciona para usted. Cules son algunos consejos para controlar las porciones?  Sepa cuntas caloras hay en una porcin. Esto lo ayudar a saber cuntas porciones de un alimento determinado puede comer.  Use una taza medidora para medir los tamaos de las porciones. Tambin Secondary school teacher las porciones en una balanza de cocina. Con el tiempo, podr hacer un clculo estimativo de los tamaos de las porciones de algunos alimentos.  Dedique tiempo a poner porciones de diferentes alimentos en sus platos, tazones y tazas predilectos, a fin de saber cmo se ve una porcin.  Intente no comer directamente de [employer] o  una caja. Esto puede llevarlo a comer en exceso. Ponga la cantidad [employer] gustara comer en una taza o un plato, a fin de asegurarse de que est comiendo la porcin correcta.  Use platos, vasos y tazones ms pequeos para no comer en exceso.  Intente no realizar varias tareas al [employer] (como mirar la TV o usar su computadora) [place] come. Si es la hora de comer, sintese a Conservation officer, nature y disfrute de Environmental education officer. Esto lo ayudar a Marine scientist cundo est satisfecho. Tambin le ayudar a tomar conciencia de lo que est comiendo y de la cantidad. Cules son algunos consejos para seguir este plan? Lectura de las etiquetas de los alimentos  Controle el recuento de  caloras en comparacin con el tamao de la porcin. El tamao de la porcin puede ser ms pequeo de lo que suele comer.  Verifique la fuente de las caloras. Asegrese de que la comida que ingiere tenga alto contenido de vitaminas y protenas y sea baja en grasas saturadas y grasas trans. De compras  Lea las etiquetas nutricionales cuando compre. Esto le ayudar a tomar decisiones ms saludables antes de comprar [employer].  Haga una lista para el almacn y resptela. La coccin  Intente cocinar sus alimentos preferidos de una manera ms saludable. Por ejemplo, pruebe hornear en vez de frer.  Utilice productos lcteos descremados. Planificacin de los alimentos  Utilice ms frutas y verduras. La mitad de sus platos debe ser de frutas y verduras.  Incluya protenas [employer] y el pescado. Cmo puedo hacer el recuento de caloras cuando como afuera?  Pida porciones ms pequeas.  Considere la posibilidad de Publishing rights manager un plato principal y las guarniciones, en lugar de pedir su propio plato principal.  Si pide su propio plato principal, coma solo la mitad. Pida una caja al comienzo de la comida y ponga all el resto del plato principal, para no sentir la tentacin de comerlo.  Si se detallan las caloras en el men, elija las opciones que contengan la menor cantidad.  Elija platos que incluyan verduras, frutas, cereales integrales, productos lcteos con bajo contenido de grasa y Advertising account planner.  Opte por los alimentos hervidos, asados, cocidos a la parrilla o al vapor. No coma alimentos que contengan mantequilla, estn empanados, fritos o que se sirvan con salsa a base de crema. Generalmente, los alimentos que se etiquetan como "crujientes" estn fritos, a menos que se indique lo contrario.  Elija el agua, la [place], [place] t helado sin azcar u otras bebidas que no contengan azcares agregados. Si desea una bebida alcohlica, escoja una opcin con menos caloras  como una copa de vino o una cerveza ligera.  Ordene los [employer], las salsas y los jarabes aparte. Estos son, con frecuencia, de alto contenido en caloras, por lo que debe limitar la cantidad que ingiere.  Si desea Katherine Mantle, elija una de hortalizas y pida carnes a la parrilla. Evite las guarniciones adicionales como el tocino, el queso o los alimentos fritos. Ordene el aderezo aparte o pida aceite de [place] y vinagre o limn para Haematologist.  Haga un clculo estimativo de la cantidad de porciones que le sirven. Por ejemplo, una porcin de arroz cocido equivale a media taza o la mitad del tamao de una pelota de bisbol. Conocer el tamao de las porciones lo ayudar a Personnel officer atento a la cantidad de comida que come [employer]. La lista que sigue le [place] el tamao de algunas porciones comunes a  partir de objetos cotidianos: ? 1onza (28g) = 4dados apilados. ? 3onzas (85g) = 50mzo de cartas. ? 1cucharadita = 1dado. ? 1cucharada = media pelota de tenis de mesa. ? 2cucharadas = 1pelota de tenis de mesa. ? Media taza = media pelota de bisbol. ? 1taza = 1 pelota de bisbol. Resumen  El recuento de caloras es el registro de la cantidad de caloras que come y bPharmacologist Si come menos caloras de las que el cuerpo necesita, debe bajar de [place]  Una cantidad de peso saludable para bajar por semana suele ser [place]1 y 2Ivar Drape(0,5 a 0,9kg). Esto significa, con frecuencia, reducir su ingesta diaria de caloras unas 500 a 750 caloras.  Es posible eAnimatorcantidad de caloras que contiene un alimento en la etiqueta de informacin nutricional. Si un alimento no tiene una etiqueta de informacin nutricional, intente buscar las caloras en Internet o pida ayuda al nutricionista.  Use las caloras de los alimentos y las bebidas que lo sacien y no de los alimentos y las bebidas que lo dejan con apetito.  Use platos, vasos y tazones ms pequeos para no comer en exceso. Esta  informacin no tiene cMarine scientistel consejo del mdico. Asegrese de hacerle al mdico cualquier pregunta que tenga. Document Released: 07/21/2008 Document Revised: 07/04/2016 Document Reviewed: 07/04/2016 Elsevier Interactive Patient Education  2[employer]

## 2017-10-24 ENCOUNTER — Other Ambulatory Visit: Payer: BLUE CROSS/BLUE SHIELD

## 2017-10-24 DIAGNOSIS — Z01419 Encounter for gynecological examination (general) (routine) without abnormal findings: Secondary | ICD-10-CM | POA: Diagnosis not present

## 2017-10-25 ENCOUNTER — Ambulatory Visit: Payer: BLUE CROSS/BLUE SHIELD | Admitting: Obstetrics & Gynecology

## 2017-10-25 ENCOUNTER — Encounter: Payer: Self-pay | Admitting: Obstetrics & Gynecology

## 2017-10-25 LAB — LIPID PANEL
Cholesterol: 140 mg/dL (ref <200)
HDL: 46 mg/dL — ABNORMAL LOW (ref >50)
LDL Cholesterol (Calc): 78 mg/dL (calc)
Non-HDL Cholesterol (Calc): 94 mg/dL (calc) (ref <130)
Total CHOL/HDL Ratio: 3 (calc) (ref <5.0)
Triglycerides: 77 mg/dL (ref <150)

## 2017-10-25 LAB — COMPREHENSIVE METABOLIC PANEL
AG Ratio: 1.3 (calc) (ref 1.0–2.5)
ALT: 13 U/L (ref 6–29)
AST: 18 U/L (ref 10–35)
Albumin: 4.1 g/dL (ref 3.6–5.1)
Alkaline phosphatase (APISO): 80 U/L (ref 33–115)
BUN/Creatinine Ratio: 23 (calc) — ABNORMAL HIGH (ref 6–22)
BUN: 11 mg/dL (ref 7–25)
CO2: 21 mmol/L (ref 20–32)
Calcium: 9.1 mg/dL (ref 8.6–10.2)
Chloride: 104 mmol/L (ref 98–110)
Creat: 0.48 mg/dL — ABNORMAL LOW (ref 0.50–1.10)
Globulin: 3.1 g/dL (calc) (ref 1.9–3.7)
Glucose, Bld: 84 mg/dL (ref 65–99)
Potassium: 4.2 mmol/L (ref 3.5–5.3)
Sodium: 136 mmol/L (ref 135–146)
Total Bilirubin: 0.6 mg/dL (ref 0.2–1.2)
Total Protein: 7.2 g/dL (ref 6.1–8.1)

## 2017-10-25 LAB — CBC
HCT: 42.5 % (ref 35.0–45.0)
Hemoglobin: 14.3 g/dL (ref 11.7–15.5)
MCH: 29.7 pg (ref 27.0–33.0)
MCHC: 33.6 g/dL (ref 32.0–36.0)
MCV: 88.2 fL (ref 80.0–100.0)
MPV: 10.4 fL (ref 7.5–12.5)
Platelets: 303 10*3/uL (ref 140–400)
RBC: 4.82 10*6/uL (ref 3.80–5.10)
RDW: 13.3 % (ref 11.0–15.0)
WBC: 6.5 10*3/uL (ref 3.8–10.8)

## 2017-10-25 LAB — TSH: TSH: 1.83 mIU/L

## 2017-12-04 ENCOUNTER — Other Ambulatory Visit: Payer: Self-pay

## 2017-12-04 ENCOUNTER — Ambulatory Visit (INDEPENDENT_AMBULATORY_CARE_PROVIDER_SITE_OTHER): Payer: BLUE CROSS/BLUE SHIELD | Admitting: Emergency Medicine

## 2017-12-04 ENCOUNTER — Encounter: Payer: Self-pay | Admitting: Emergency Medicine

## 2017-12-04 VITALS — BP 123/79 | HR 74 | Temp 98.2°F | Resp 16 | Ht 64.0 in | Wt 190.0 lb

## 2017-12-04 DIAGNOSIS — M5489 Other dorsalgia: Secondary | ICD-10-CM

## 2017-12-04 DIAGNOSIS — L089 Local infection of the skin and subcutaneous tissue, unspecified: Secondary | ICD-10-CM

## 2017-12-04 DIAGNOSIS — M5431 Sciatica, right side: Secondary | ICD-10-CM

## 2017-12-04 DIAGNOSIS — S39012A Strain of muscle, fascia and tendon of lower back, initial encounter: Secondary | ICD-10-CM

## 2017-12-04 DIAGNOSIS — R300 Dysuria: Secondary | ICD-10-CM | POA: Diagnosis not present

## 2017-12-04 DIAGNOSIS — M549 Dorsalgia, unspecified: Secondary | ICD-10-CM | POA: Insufficient documentation

## 2017-12-04 DIAGNOSIS — M62838 Other muscle spasm: Secondary | ICD-10-CM

## 2017-12-04 DIAGNOSIS — M7918 Myalgia, other site: Secondary | ICD-10-CM

## 2017-12-04 LAB — POCT URINALYSIS DIP (MANUAL ENTRY)
Bilirubin, UA: NEGATIVE
Glucose, UA: NEGATIVE mg/dL
Ketones, POC UA: NEGATIVE mg/dL
Nitrite, UA: POSITIVE — AB
Protein Ur, POC: NEGATIVE mg/dL
Spec Grav, UA: 1.02 (ref 1.010–1.025)
Urobilinogen, UA: 0.2 E.U./dL
pH, UA: 6 (ref 5.0–8.0)

## 2017-12-04 MED ORDER — CEFADROXIL 500 MG PO CAPS: 500.0000 mg | ORAL_CAPSULE | Freq: Two times a day (BID) | ORAL | 0 refills | Status: AC

## 2017-12-04 MED ORDER — DICLOFENAC SODIUM 75 MG PO TBEC: 75.0000 mg | DELAYED_RELEASE_TABLET | Freq: Two times a day (BID) | ORAL | 0 refills | Status: AC

## 2017-12-04 MED ORDER — CYCLOBENZAPRINE HCL 10 MG PO TABS: 10.0000 mg | ORAL_TABLET | Freq: Every day | ORAL | 1 refills | Status: DC

## 2017-12-04 NOTE — Patient Instructions (Addendum)
If you have lab work done today you will be contacted with your lab results within the next 2 weeks.  If you have not heard from [place] then please contact us. The fastest way to get your results is to register for My Chart.   IF you received an x-ray today, you will receive an invoice from [facility]. Please contact [facility] at [phone] with questions or concerns regarding your invoice.   IF you received labwork today, you will receive an invoice from [facility]. Please contact [facility] at [phone] with questions or concerns regarding your invoice.   Our billing staff will not be able to assist you with questions regarding bills from these companies.  You will be contacted with the lab results as soon as they are available. The fastest way to get your results is to activate your My Chart account. Instructions are located on the last page of this paperwork. If you have not heard from [place] regarding the results in 2 weeks, please contact this office.      Infeccin urinaria en los adultos (Urinary Tract Infection, Adult) Una infeccin urinaria (IU) puede ocurrir en Clinical cytogeneticist de las vas [place]. Las vas urinarias incluyen lo siguiente:  Riones.  Urteres.  Vejiga.  Uretra. Estos rganos fabrican, Buyer, retail y eliminan la orina del organismo. [place] los medicamentos de venta libre y los recetados solamente como se lo haya indicado el mdico.  Si le recetaron un antibitico, tmelo como se lo haya indicado el mdico. No deje de tomar los antibiticos aunque comience a sentirse mejor.  Evite beber lo siguiente: ? Alcohol. ? Cafena. ? T. ? Bebidas con gas.  Beba suficiente lquido para mantener el pis claro o de color amarillo plido.  Concurra a todas las visitas de control como se lo haya indicado el mdico. Esto es importante.  Asegrese de lo siguiente: ? Vaciar la vejiga con frecuencia y en su totalidad. No  contener la orina durante largos perodos. ? Vaciar la vejiga antes y despus de Clinical biochemist. ? Limpiar de adelante hacia atrs despus de defecar, si es mujer. Usar cada trozo de papel una vez cuando se limpie. SOLICITE AYUDA SI:  Siente dolor en la espalda.  Tiene fiebre.  Siente malestar estomacal (nuseas).  Vomita.  Los sntomas no mejoran despus de 3das de tratamiento.  Los sntomas desaparecen y Teacher, adult education. SOLICITE AYUDA DE INMEDIATO SI:  Siente un dolor muy intenso en la espalda.  Siente un dolor muy intenso en la parte inferior del abdomen.  Tiene vmitos y no puede retener los medicamentos ni el agua. Esta informacin no tiene Marine scientist el consejo del mdico. Asegrese de hacerle al mdico cualquier pregunta que tenga. Document Released: 09/22/2009 Document Revised: 07/27/2015 Document Reviewed: 02/23/2015 Elsevier Interactive Patient Education  2018 [place] en adultos (Back Pain, Adult) El dolor de espalda es muy frecuente. A menudo mejora con el tiempo. La causa del dolor de espalda generalmente no es peligrosa. La [employer] de las personas puede aprender a Administrator, sports de espalda por s mismas. CUIDADOS EN EL HOGAR Controle su dolor de espalda a fin de Recruitment consultant cambio. Las siguientes indicaciones ayudarn a Education officer, community que pueda sentir:  Quarry manager. Comience con caminatas cortas sobre superficies planas si es posible. Trate de caminar un poco ms [place].  Haga ejercicios con regularidad tal como le indic el mdico. El ejercicio ayuda a  que su espalda se cure ms rpidamente. Tambin ayuda a prevenir futuras lesiones al [employer] fuertes y flexibles.  No se siente, conduzca ni permanezca de pie durante ms de 30 minutos.  No permanezca en la cama. Si hace reposo ms de 1 a 2 das, puede demorar su recuperacin.  Sea cuidadoso al inclinarse o levantar un objeto. Use  una tcnica apropiada para levantar peso: ? [place]. ? Mantenga el objeto cerca del cuerpo. ? No gire.  Duerma sobre un [employer]. Recustese sobre un costado y [place]. Si se recuesta [employer], coloque una almohada debajo de las rodillas.  Tome los medicamentos solamente como se lo haya indicado el mdico.  Aplique hielo sobre la zona lesionada. ? Ponga el hielo en una bolsa plstica. ? Coloque una [employer] piel y la bolsa de hielo. ? Deje el hielo durante 93minutos, 2 a 3veces por da, durante los primeros 2 o 3das. Despus de eso, puede alternar entre compresas de hielo y Freight forwarder.  Evite sentir ansiedad o estrs. Encuentre maneras efectivas de lidiar con el estrs, Teacher, English as a foreign language ejercicio.  Mantenga un peso saludable. El peso excesivo ejerce tensin sobre la espalda. SOLICITE AYUDA SI:  Siente dolor que no se alivia con reposo o medicamentos.  Siente cada vez ms dolor que se extiende a las piernas o los glteos.  El dolor no mejora en una semana.  Siente dolor por la noche.  Pierde peso.  Siente escalofros o fiebre. SOLICITE AYUDA DE INMEDIATO SI:  No puede controlar su materia fecal (heces) o el pis (orina).  Siente debilidad en las piernas o los brazos.  Siente prdida de la sensibilidad (adormecimiento) en las piernas o los brazos.  Tiene malestar estomacal (nuseas) o vomita.  Siente dolor de estmago (abdominal).  Siente que se desvanece (se desmaya). Esta informacin no tiene Marine scientist el consejo del mdico. Asegrese de hacerle al mdico cualquier pregunta que tenga. Document Released: 10/18/2010 Document Revised: 04/25/2014 Document Reviewed: 08/06/2013 Elsevier Interactive Patient Education  [employer].

## 2017-12-04 NOTE — Progress Notes (Signed)
[facility] 47 y.o.   Chief Complaint  Patient presents with  . Back Pain    lower/ x 3 days    HISTORY OF PRESENT ILLNESS: [name] complaining of right lower back pain for the past 3 days.  Has been working out at [employer] and thinks that it is a result of certain weight lifting exercise.  Pain is sharp and constant with radiation down the back of the right leg. Also complaining of some urinary frequency and burning.  Denies nausea or vomiting.  Denies fever or chills.  Denies hematuria.  HPI   Prior to Admission medications   Medication Sig Start Date End Date Taking? Authorizing Provider  azelastine (ASTELIN) 0.1 % nasal spray Place 1 spray into both nostrils 2 (two) times daily. Use in each nostril as directed 06/22/17  Yes Rutherford Guys, MD  escitalopram (LEXAPRO) 10 MG tablet TOME UNA TABLETA TODOS LOS DIAS 10/13/17  Yes Princess Bruins, MD  fluticasone (FLONASE) 50 MCG/ACT nasal spray Place 1 spray into both nostrils 2 (two) times daily. 06/22/17  Yes Rutherford Guys, MD  phentermine 37.5 MG capsule Take 1 capsule (37.5 mg total) by mouth every morning. 09/20/17  Yes Princess Bruins, MD  PROAIR HFA 108 [phone] Base) MCG/ACT inhaler PLEASE SEE ATTACHED FOR DETAILED DIRECTIONS 08/07/17  Yes Wardell Honour, MD    Allergies  Allergen Reactions  . Latex Itching and Rash    Itching with use of condoms    Patient Active Problem List   Diagnosis Date Noted  . Insect bite of right ankle 09/26/2017  . Skin infection 09/26/2017  . Acute laryngitis 05/17/2017  . Skin lesion of back 02/13/2017  . Sore throat 10/27/2016  . Intramural leiomyoma of uterus 02/29/2016  . GERD (gastroesophageal reflux disease) 10/19/2015  . History of pulmonary embolus (PE) 02/24/2015  . Hx of pulmonary embolus during pregnancy 11/09/2014    Past Medical History:  Diagnosis Date  . Abnormal Pap smear    patient states she had cancer that was removed from her cervix  .  Asthma    when pregnant/ once 6 yrs ago only time asthma attack the dr said  . Cancer ([place])   . Clotting disorder ([place])   . Gestational diabetes   . Intramural leiomyoma of uterus 02/29/2016  . Pulmonary embolism ([place])   . Renal insufficiency   . Vitamin D deficiency   . Vitamin D deficiency     Past Surgical History:  Procedure Laterality Date  . CHOLECYSTECTOMY      Social History   Socioeconomic History  . Marital status: Married    Spouse name: Not on file  . Number of children: 5  . Years of education: Not on file  . Highest education level: Not on file  Occupational History  . Occupation: Stay at [employer]  . Financial resource strain: Not on file  . Food insecurity:    Worry: Not on file    Inability: Not on file  . Transportation needs:    Medical: Not on file    Non-medical: Not on file  Tobacco Use  . Smoking status: Never Smoker  . Smokeless tobacco: Never Used  Substance and Sexual Activity  . Alcohol use: Yes    Alcohol/week: 0.0 standard drinks    Comment: Occasionally.  . Drug use: No  . Sexual activity: Yes    Partners: Male    Birth control/protection: Condom  Lifestyle  . Physical activity:    Days per week: Not on file    Minutes per session: Not on file  . Stress: Not on file  Relationships  . Social connections:    Talks on phone: Not on file    Gets together: Not on file    Attends religious service: Not on file    Active member of club or organization: Not on file    Attends meetings of clubs or organizations: Not on file    Relationship status: Not on file  . Intimate partner violence:    Fear of current or ex partner: Not on file    Emotionally abused: Not on file    Physically abused: Not on file    Forced sexual activity: Not on file  Other Topics Concern  . Not on file  Social History Narrative   Married with five children. Came to [place] from [place] in 2001. Unemployed currently. Spanish speaking.     Family History  Problem Relation Age of Onset  . Diabetes Mother   . Hypertension Mother   . Hyperlipidemia Father   . Diabetes Father   . Hypertension Father   . Heart disease Sister   . Diabetes Brother   . Hyperlipidemia Brother   . Drug abuse Paternal Grandmother   . Kidney disease Other   . Birth defects Other        anal atresia/stenosis     Review of Systems  Constitutional: Negative for chills and fever.  HENT: Negative.  Negative for sore throat.   Eyes: Negative.   Respiratory: Negative.  Negative for cough and shortness of breath.   Cardiovascular: Negative for chest pain and palpitations.  Gastrointestinal: Negative.  Negative for abdominal pain, blood in stool, diarrhea, melena, nausea and vomiting.  Genitourinary: Positive for dysuria and frequency. Negative for flank pain and hematuria.  Musculoskeletal: Positive for back pain.  Skin: Negative.   Neurological: Negative for dizziness and headaches.  Endo/Heme/Allergies: Negative.   All other systems reviewed and are negative.     Vitals:   12/04/17 0948  BP: 123/79  Pulse: 74  Resp: 16  Temp: 98.2 F (36.8 C)  SpO2: 97%    Physical Exam  Constitutional: She is oriented to person, place, and time. She appears well-developed and well-nourished.  HENT:  Head: Normocephalic and atraumatic.  Mouth/Throat: Oropharynx is clear and moist.  Eyes: Pupils are equal, round, and reactive to light. Conjunctivae and EOM are normal.  Neck: Normal range of motion. Neck supple. No JVD present.  Cardiovascular: Normal rate, regular rhythm and normal heart sounds.  Pulmonary/Chest: Effort normal and breath sounds normal.  Abdominal: Soft. Bowel sounds are normal. She exhibits no distension. There is no tenderness.  Musculoskeletal: She exhibits no edema.       Lumbar back: She exhibits decreased range of motion, tenderness and spasm. She exhibits no bony tenderness and normal pulse.        Back:  Lymphadenopathy:    She has no cervical adenopathy.  Neurological: She is alert and oriented to person, place, and time. No sensory deficit. She exhibits normal muscle tone.  Skin: Skin is warm and dry. Capillary refill takes less than 2 seconds. No rash noted.  Psychiatric: She has a normal mood and affect. Her behavior is normal.  Vitals reviewed.   Results for orders placed or performed in visit on 12/04/17 (from the past 24 hour(s))  POCT urinalysis dipstick     Status: Abnormal  Collection Time: 12/04/17 10:02 AM  Result Value Ref Range   Color, UA yellow yellow   Clarity, UA clear clear   Glucose, UA negative negative mg/dL   Bilirubin, UA negative negative   Ketones, POC UA negative negative mg/dL   Spec Grav, UA 1.020 1.010 - 1.025   Blood, UA small (A) negative   pH, UA 6.0 5.0 - 8.0   Protein Ur, POC negative negative mg/dL   Urobilinogen, UA 0.2 0.2 or 1.0 E.U./dL   Nitrite, UA Positive (A) Negative   Leukocytes, UA Small (1+) (A) Negative   A total of 25 minutes was spent in the room with the patient, greater than 50% of which was in counseling/coordination of care regarding differential diagnosis, management, medications, and need for follow-up if no better or worse.  ASSESSMENT & PLAN: Etty was seen today for back pain.  Diagnoses and all orders for this visit:  Acute myofascial strain of lumbar region, initial encounter  Other back pain, unspecified chronicity -     POCT urinalysis dipstick -     Urine Culture  Dysuria -     cefadroxil (DURICEF) 500 MG capsule; Take 1 capsule (500 mg total) by mouth 2 (two) times daily for 7 days.  Sciatica of right side  Muscle spasm -     cyclobenzaprine (FLEXERIL) 10 MG tablet; Take 1 tablet (10 mg total) by mouth at bedtime.  Musculoskeletal pain -     diclofenac (VOLTAREN) 75 MG EC tablet; Take 1 tablet (75 mg total) by mouth 2 (two) times daily for 5 days. After 5 days take as needed.    Patient  Instructions       If you have lab work done today you will be contacted with your lab results within the next 2 weeks.  If you have not heard from [place] then please contact us. The fastest way to get your results is to register for My Chart.   IF you received an x-ray today, you will receive an invoice from [facility]. Please contact [facility] at [phone] with questions or concerns regarding your invoice.   IF you received labwork today, you will receive an invoice from [place]. Please contact [facility] at [phone] with questions or concerns regarding your invoice.   Our billing staff will not be able to assist you with questions regarding bills from these companies.  You will be contacted with the lab results as soon as they are available. The fastest way to get your results is to activate your My Chart account. Instructions are located on the last page of this paperwork. If you have not heard from [place] regarding the results in 2 weeks, please contact this office.      Infeccin urinaria en los adultos (Urinary Tract Infection, Adult) Una infeccin urinaria (IU) puede ocurrir en Clinical cytogeneticist de las vas [place]. Las vas urinarias incluyen lo siguiente:  Riones.  Urteres.  Vejiga.  Uretra. Estos rganos fabrican, Buyer, retail y eliminan la orina del organismo. [employer] los medicamentos de venta libre y los recetados solamente como se lo haya indicado el mdico.  Si le recetaron un antibitico, tmelo como se lo haya indicado el mdico. No deje de tomar los antibiticos aunque comience a sentirse mejor.  Evite beber lo siguiente: ? Alcohol. ? Cafena. ? T. ? Bebidas con gas.  Beba suficiente lquido para mantener el pis claro o de color amarillo plido.  Concurra a todas las visitas de control como  se lo haya indicado el mdico. Esto es importante.  Asegrese de lo siguiente: ? Vaciar la vejiga con frecuencia y en su  totalidad. No contener la orina durante largos perodos. ? Vaciar la vejiga antes y despus de Clinical biochemist. ? Limpiar de adelante hacia atrs despus de defecar, si es mujer. Usar cada trozo de papel una vez cuando se limpie. SOLICITE AYUDA SI:  Siente dolor en la espalda.  Tiene fiebre.  Siente malestar estomacal (nuseas).  Vomita.  Los sntomas no mejoran despus de 3das de tratamiento.  Los sntomas desaparecen y Teacher, adult education. SOLICITE AYUDA DE INMEDIATO SI:  Siente un dolor muy intenso en la espalda.  Siente un dolor muy intenso en la parte inferior del abdomen.  Tiene vmitos y no puede retener los medicamentos ni el agua. Esta informacin no tiene Marine scientist el consejo del mdico. Asegrese de hacerle al mdico cualquier pregunta que tenga. Document Released: 09/22/2009 Document Revised: 07/27/2015 Document Reviewed: 02/23/2015 Elsevier Interactive Patient Education  2018 [place] en adultos (Back Pain, Adult) El dolor de espalda es muy frecuente. A menudo mejora con el tiempo. La causa del dolor de espalda generalmente no es peligrosa. La [employer] de las personas puede aprender a Administrator, sports de espalda por s mismas. CUIDADOS EN EL HOGAR Controle su dolor de espalda a fin de Recruitment consultant cambio. Las siguientes indicaciones ayudarn a Education officer, community que pueda sentir:  Quarry manager. Comience con caminatas cortas sobre superficies planas si es posible. Trate de caminar un poco ms [place].  Haga ejercicios con regularidad tal como le indic el mdico. El ejercicio ayuda a que su espalda se cure ms rpidamente. Tambin ayuda a prevenir futuras lesiones al [employer] fuertes y flexibles.  No se siente, conduzca ni permanezca de pie durante ms de 30 minutos.  No permanezca en la cama. Si hace reposo ms de 1 a 2 das, puede demorar su recuperacin.  Sea cuidadoso al inclinarse o levantar  un objeto. Use una tcnica apropiada para levantar peso: ? [place]. ? Mantenga el objeto cerca del cuerpo. ? No gire.  Duerma sobre un [employer]. Recustese sobre un costado y [place]. Si se recuesta [employer], coloque una almohada debajo de las rodillas.  Tome los medicamentos solamente como se lo haya indicado el mdico.  Aplique hielo sobre la zona lesionada. ? Ponga el hielo en una bolsa plstica. ? Coloque una [employer] piel y la bolsa de hielo. ? Deje el hielo durante 58minutos, 2 a 3veces por da, durante los primeros 2 o 3das. Despus de eso, puede alternar entre compresas de hielo y Freight forwarder.  Evite sentir ansiedad o estrs. Encuentre maneras efectivas de lidiar con el estrs, Teacher, English as a foreign language ejercicio.  Mantenga un peso saludable. El peso excesivo ejerce tensin sobre la espalda. SOLICITE AYUDA SI:  Siente dolor que no se alivia con reposo o medicamentos.  Siente cada vez ms dolor que se extiende a las piernas o los glteos.  El dolor no mejora en una semana.  Siente dolor por la noche.  Pierde peso.  Siente escalofros o fiebre. SOLICITE AYUDA DE INMEDIATO SI:  No puede controlar su materia fecal (heces) o el pis (orina).  Siente debilidad en las piernas o los brazos.  Siente prdida de la sensibilidad (adormecimiento) en las piernas o los brazos.  Tiene malestar estomacal (nuseas) o vomita.  Siente dolor de estmago (abdominal).  Siente que  se desvanece (se desmaya). Esta informacin no tiene Marine scientist el consejo del mdico. Asegrese de hacerle al mdico cualquier pregunta que tenga. Document Released: 10/18/2010 Document Revised: 04/25/2014 Document Reviewed: 08/06/2013 Elsevier Interactive Patient Education  2018 Elsevier Inc.      Agustina Caroli, MD [facility]

## 2017-12-07 LAB — URINE CULTURE

## 2017-12-08 ENCOUNTER — Encounter: Payer: Self-pay | Admitting: Radiology

## 2017-12-11 ENCOUNTER — Encounter: Payer: Self-pay | Admitting: Emergency Medicine

## 2017-12-11 ENCOUNTER — Other Ambulatory Visit: Payer: Self-pay

## 2017-12-11 ENCOUNTER — Ambulatory Visit: Payer: Self-pay

## 2017-12-11 ENCOUNTER — Ambulatory Visit (INDEPENDENT_AMBULATORY_CARE_PROVIDER_SITE_OTHER): Payer: BLUE CROSS/BLUE SHIELD

## 2017-12-11 ENCOUNTER — Ambulatory Visit (INDEPENDENT_AMBULATORY_CARE_PROVIDER_SITE_OTHER): Payer: BLUE CROSS/BLUE SHIELD | Admitting: Emergency Medicine

## 2017-12-11 VITALS — BP 107/74 | HR 83 | Temp 98.9°F | Resp 16 | Wt 191.2 lb

## 2017-12-11 DIAGNOSIS — M7918 Myalgia, other site: Secondary | ICD-10-CM

## 2017-12-11 DIAGNOSIS — S20211A Contusion of right front wall of thorax, initial encounter: Secondary | ICD-10-CM

## 2017-12-11 DIAGNOSIS — S20219A Contusion of unspecified front wall of thorax, initial encounter: Secondary | ICD-10-CM | POA: Diagnosis not present

## 2017-12-11 NOTE — Telephone Encounter (Signed)
Pt.'s son interpreting for pt. Reports she fell at Saturday - tripped over a blanket and fell to the floor. C/o pain to right ribs under her arm. Hurts to lay on that side and with certain movements. Sometimes has discomfort with a deep breath. No shortness of breath or other symptoms. No bruising or swelling noted.Appointment made for today with her provider. Instructed if symptoms worsen, call back. Verbalizes understanding.  Reason for Disposition . [1] After 72 hours AND [2] chest pain not improving  Answer Assessment - Initial Assessment Questions 1. MECHANISM: "How did the injury happen?"     Fell at home  - tripped over a blanket 2. ONSET: "When did the injury happen?" (Minutes or hours ago)     This past Saturday 3. LOCATION: "Where on the chest is the injury located?"     Right ribs - under her arm 4. APPEARANCE: "What does the injury look like?"     No bruising or swelling 5. BLEEDING: "Is there any bleeding now? If so, ask: How long has it been bleeding?"     No 6. SEVERITY: "Any difficulty with breathing?"     Sometimes hurts with a deep breath 7. SIZE: For cuts, bruises, or swelling, ask: "How large is it?" (e.g., inches or centimeters)     No cuts 8. PAIN: "Is there pain?" If so, ask: "How bad is the pain?"   (e.g., Scale 1-10; or mild, moderate, severe)      7 9. TETANUS: For any breaks in the skin, ask: "When was the last tetanus booster?"     [name] 10. PREGNANCY: "Is there any chance you are pregnant?" "When was your last menstrual period?"       No  Protocols used: CHEST INJURY-A-AH

## 2017-12-11 NOTE — Patient Instructions (Addendum)
     If you have lab work done today you will be contacted with your lab results within the next 2 weeks.  If you have not heard from [place] then please contact us. The fastest way to get your results is to register for My Chart.   IF you received an x-ray today, you will receive an invoice from [facility]. Please contact [facility] at [phone] with questions or concerns regarding your invoice.   IF you received labwork today, you will receive an invoice from [place]. Please contact [facility] at [phone] with questions or concerns regarding your invoice.   Our billing staff will not be able to assist you with questions regarding bills from these companies.  You will be contacted with the lab results as soon as they are available. The fastest way to get your results is to activate your My Chart account. Instructions are located on the last page of this paperwork. If you have not heard from [place] regarding the results in 2 weeks, please contact this office.     Contusin (Contusion) Una contusin es un hematoma profundo. Las contusiones ocurren cuando una lesin causa un sangrado debajo de la piel. Los sntomas de hematoma incluyen dolor, hinchazn y cambio de color en la piel. La piel puede ponerse azul, morada o [place]. CUIDADOS EN EL HOGAR  Mantenga la zona de la lesin en reposo.  Aplique hielo sobre la zona lesionada, si se lo indican. ? Ponga el hielo en una bolsa plstica. ? Coloque una [employer] piel y la bolsa de hielo. ? Coloque el hielo durante 51mnutos, 2 a 3veces por da.  Si se lo indican, ejerza una presin suave (compresin) en la zona de la lesin con una venda elstica. Asegrese de que la venda no est [place] Retrela y vuelva a cLawyercomo se lo haya indicado el mdico.  Cuando est sentado o acostado, eleve la zona de la lesin por encima del nivel del corazn, si es posible.  Tome los medicamentos de venta libre y los  recetados solamente como se lo haya indicado el mdico. SOLICITE AYUDA SI:  Los sntomas no mejoran despus de varios das de [place]  Los sntomas empeoran.  Tiene dificultad para mover la zona de la lesin. SOLICITE AYUDA DE INMEDIATO SI:  Siente mucho dolor.  Pierde la sensibilidad (adormecimiento) en una mano o un pie.  La mano o el pie estn plidos o fros. Esta informacin no tiene cMarine scientistel consejo del mdico. Asegrese de hacerle al mdico cualquier pregunta que tenga. Document Released: 03/24/2011 Document Revised: 12/24/2014 Document Reviewed: 08/20/2014 Elsevier Interactive Patient Education  2018 [employer]

## 2017-12-11 NOTE — Progress Notes (Signed)
[facility] 47 y.o.   Chief Complaint  Patient presents with  . Fall    12/10/2017  injury to Right side with pain, more pain when lying down    HISTORY OF PRESENT ILLNESS: [name] fell 2 days ago and injured right rib cage complaining of pain since.  Denies syncope.  Slipped on wet ground. Pain is constant sharp and worse with movement.  Better with rest.  Took some Tylenol with relief.  No radiation no associated symptoms. HPI   Prior to Admission medications   Medication Sig Start Date End Date Taking? Authorizing Provider  azelastine (ASTELIN) 0.1 % nasal spray Place 1 spray into both nostrils 2 (two) times daily. Use in each nostril as directed 06/22/17  Yes Rutherford Guys, MD  cyclobenzaprine (FLEXERIL) 10 MG tablet Take 1 tablet (10 mg total) by mouth at bedtime. 12/04/17  Yes , Ines Bloomer, MD  escitalopram (LEXAPRO) 10 MG tablet TOME UNA TABLETA TODOS LOS DIAS 10/13/17  Yes Princess Bruins, MD  fluticasone (FLONASE) 50 MCG/ACT nasal spray Place 1 spray into both nostrils 2 (two) times daily. 06/22/17  Yes Rutherford Guys, MD  phentermine 37.5 MG capsule Take 1 capsule (37.5 mg total) by mouth every morning. 09/20/17  Yes Princess Bruins, MD  [facility] HFA 108 [phone] Base) MCG/ACT inhaler PLEASE SEE ATTACHED FOR DETAILED DIRECTIONS 08/07/17  Yes Wardell Honour, MD  cefadroxil (DURICEF) 500 MG capsule Take 1 capsule (500 mg total) by mouth 2 (two) times daily for 7 days. Patient not taking: Reported on 12/11/2017 12/04/17 12/11/17  Horald Pollen, MD    Allergies  Allergen Reactions  . Latex Itching and Rash    Itching with use of condoms    Patient Active Problem List   Diagnosis Date Noted  . Back pain 12/04/2017  . Acute lumbar myofascial strain 12/04/2017  . Muscle spasm 12/04/2017  . Musculoskeletal pain 12/04/2017  . Skin infection 09/26/2017  . Acute laryngitis 05/17/2017  . Skin lesion of back 02/13/2017  . Intramural  leiomyoma of uterus 02/29/2016  . GERD (gastroesophageal reflux disease) 10/19/2015  . Dysuria 06/11/2015  . History of pulmonary embolus (PE) 02/24/2015  . Sciatica of right side 11/24/2014  . Hx of pulmonary embolus during pregnancy 11/09/2014    Past Medical History:  Diagnosis Date  . Abnormal Pap smear    patient states she had cancer that was removed from her cervix  . Asthma    when pregnant/ once 6 yrs ago only time asthma attack the dr said  . Cancer ([place])   . Clotting disorder ([place])   . Gestational diabetes   . Intramural leiomyoma of uterus 02/29/2016  . Pulmonary embolism ([place])   . Renal insufficiency   . Vitamin D deficiency   . Vitamin D deficiency     Past Surgical History:  Procedure Laterality Date  . CHOLECYSTECTOMY      Social History   Socioeconomic History  . Marital status: Married    Spouse name: Not on file  . Number of children: 5  . Years of education: Not on file  . Highest education level: Not on file  Occupational History  . Occupation: Stay at [employer]  . Financial resource strain: Not on file  . Food insecurity:    Worry: Not on file    Inability: Not on file  . Transportation needs:    Medical: Not on file  Non-medical: Not on file  Tobacco Use  . Smoking status: Never Smoker  . Smokeless tobacco: Never Used  Substance and Sexual Activity  . Alcohol use: Yes    Alcohol/week: 0.0 standard drinks    Comment: Occasionally.  . Drug use: No  . Sexual activity: Yes    Partners: Male    Birth control/protection: Condom  Lifestyle  . Physical activity:    Days per week: Not on file    Minutes per session: Not on file  . Stress: Not on file  Relationships  . Social connections:    Talks on phone: Not on file    Gets together: Not on file    Attends religious service: Not on file    Active member of club or organization: Not on file    Attends meetings of clubs or organizations: Not on file    Relationship  status: Not on file  . Intimate partner violence:    Fear of current or ex partner: Not on file    Emotionally abused: Not on file    Physically abused: Not on file    Forced sexual activity: Not on file  Other Topics Concern  . Not on file  Social History Narrative   Married with five children. Came to [place] from [place] in 2001. Unemployed currently. Spanish speaking.    Family History  Problem Relation Age of Onset  . Diabetes Mother   . Hypertension Mother   . Hyperlipidemia Father   . Diabetes Father   . Hypertension Father   . Heart disease Sister   . Diabetes Brother   . Hyperlipidemia Brother   . Drug abuse Paternal Grandmother   . Kidney disease Other   . Birth defects Other        anal atresia/stenosis     Review of Systems  Constitutional: Negative.  Negative for chills and fever.  HENT: Negative.   Eyes: Negative.   Respiratory: Negative.  Negative for cough, hemoptysis and shortness of breath.   Cardiovascular: Negative.  Negative for chest pain and leg swelling.  Gastrointestinal: Negative.  Negative for abdominal pain, blood in stool, diarrhea, nausea and vomiting.  Genitourinary: Negative.  Negative for dysuria and hematuria.  Musculoskeletal: Positive for back pain.       Rib cage pain  Skin: Negative.  Negative for rash.  Neurological: Negative.  Negative for dizziness and headaches.  Endo/Heme/Allergies: Negative.   All other systems reviewed and are negative.  Vitals:   12/11/17 1404  BP: 107/74  Pulse: 83  Resp: 16  Temp: 98.9 F (37.2 C)  SpO2: 99%    Physical Exam  Constitutional: She is oriented to person, place, and time. She appears well-developed and well-nourished.  HENT:  Head: Normocephalic and atraumatic.  Nose: Nose normal.  Mouth/Throat: Oropharynx is clear and moist.  Eyes: Pupils are equal, round, and reactive to light. Conjunctivae and EOM are normal.  Neck: Normal range of motion. Neck supple.  Cardiovascular:  Normal rate, regular rhythm and normal heart sounds.  Pulmonary/Chest: Effort normal and breath sounds normal.  Abdominal: Soft. Bowel sounds are normal. She exhibits no distension. There is no tenderness.  Musculoskeletal:  Positive tenderness to right lower lateral rib cage  Neurological: She is alert and oriented to person, place, and time. No sensory deficit. She exhibits normal muscle tone.  Skin: Skin is warm and dry. Capillary refill takes less than 2 seconds.  Psychiatric: She has a normal mood and affect. Her behavior is normal.  Vitals reviewed.  Dg Ribs Unilateral W/chest Right  Result Date: 12/11/2017 CLINICAL DATA:  Chest wall contusion. EXAM: RIGHT RIBS AND CHEST - 3+ VIEW COMPARISON:  Radiographs of June 08, 2017. FINDINGS: No fracture or other bone lesions are seen involving the ribs. There is no evidence of pneumothorax or pleural effusion. Both lungs are clear. Heart size and mediastinal contours are within normal limits. IMPRESSION: Normal right ribs.  No acute cardiopulmonary abnormality seen. Electronically Signed   By: Marijo Conception, M.D.   On: 12/11/2017 14:47   A total of 25 minutes was spent in the room with the patient, greater than 50% of which was in counseling/coordination of care regarding differential diagnosis, management, treatment, medication, and need for follow-up. .   ASSESSMENT & PLAN: Kim [name] was seen today for fall.  Diagnoses and all orders for this visit:  Chest wall contusion, right, initial encounter -     DG Ribs Unilateral W/Chest Right; Future  Musculoskeletal pain    Patient Instructions       If you have lab work done today you will be contacted with your lab results within the next 2 weeks.  If you have not heard from [place] then please contact us. The fastest way to get your results is to register for My Chart.   IF you received an x-ray today, you will receive an invoice from [facility]. Please contact [facility]  Radiology at [phone] with questions or concerns regarding your invoice.   IF you received labwork today, you will receive an invoice from [facility]. Please contact [facility] at [phone] with questions or concerns regarding your invoice.   Our billing staff will not be able to assist you with questions regarding bills from these companies.  You will be contacted with the lab results as soon as they are available. The fastest way to get your results is to activate your My Chart account. Instructions are located on the last page of this paperwork. If you have not heard from [place] regarding the results in 2 weeks, please contact this office.     Contusin (Contusion) Una contusin es un hematoma profundo. Las contusiones ocurren cuando una lesin causa un sangrado debajo de la piel. Los sntomas de hematoma incluyen dolor, hinchazn y cambio de color en la piel. La piel puede ponerse azul, morada o [place]. CUIDADOS EN EL HOGAR  Mantenga la zona de la lesin en reposo.  Aplique hielo sobre la zona lesionada, si se lo indican. ? Ponga el hielo en una bolsa plstica. ? Coloque una [employer] piel y la bolsa de hielo. ? Coloque el hielo durante 17mnutos, 2 a 3veces por da.  Si se lo indican, ejerza una presin suave (compresin) en la zona de la lesin con una venda elstica. Asegrese de que la venda no est [place] y vuelva a cLawyercomo se lo haya indicado el mdico.  Cuando est sentado o acostado, eleve la zona de la lesin por encima del nivel del corazn, si es posible.  Tome los medicamentos de venta libre y los recetados solamente como se lo haya indicado el mdico. SOLICITE AYUDA SI:  Los sntomas no mejoran despus de varios das de [place]  Los sntomas empeoran.  Tiene dificultad para mover la zona de la lesin. SOLICITE AYUDA DE INMEDIATO SI:  Siente mucho dolor.  Pierde la sensibilidad (adormecimiento) en una mano o un pie.  La mano  o el pie estn plidos o fros. Esta informacin no tiene  como fin reemplazar el consejo del mdico. Asegrese de hacerle al mdico cualquier pregunta que tenga. Document Released: 03/24/2011 Document Revised: 12/24/2014 Document Reviewed: 08/20/2014 Elsevier Interactive Patient Education  2018 Elsevier Inc.      Agustina Caroli, MD [facility]

## 2017-12-11 NOTE — Progress Notes (Signed)
f °

## 2017-12-15 ENCOUNTER — Other Ambulatory Visit: Payer: Self-pay | Admitting: Family Medicine

## 2017-12-25 ENCOUNTER — Ambulatory Visit: Payer: BLUE CROSS/BLUE SHIELD | Admitting: Emergency Medicine

## 2017-12-25 ENCOUNTER — Other Ambulatory Visit: Payer: Self-pay

## 2017-12-25 ENCOUNTER — Encounter: Payer: Self-pay | Admitting: Emergency Medicine

## 2017-12-25 VITALS — BP 107/71 | HR 92 | Temp 98.3°F | Resp 16 | Wt 190.2 lb

## 2017-12-25 DIAGNOSIS — Z87440 Personal history of urinary (tract) infections: Secondary | ICD-10-CM | POA: Diagnosis not present

## 2017-12-25 DIAGNOSIS — N390 Urinary tract infection, site not specified: Secondary | ICD-10-CM | POA: Diagnosis not present

## 2017-12-25 DIAGNOSIS — M7918 Myalgia, other site: Secondary | ICD-10-CM | POA: Diagnosis not present

## 2017-12-25 DIAGNOSIS — S29011S Strain of muscle and tendon of front wall of thorax, sequela: Secondary | ICD-10-CM | POA: Diagnosis not present

## 2017-12-25 DIAGNOSIS — S29011A Strain of muscle and tendon of front wall of thorax, initial encounter: Secondary | ICD-10-CM | POA: Insufficient documentation

## 2017-12-25 LAB — POCT URINALYSIS DIP (MANUAL ENTRY)
Bilirubin, UA: NEGATIVE
Glucose, UA: NEGATIVE mg/dL
Ketones, POC UA: NEGATIVE mg/dL
Nitrite, UA: POSITIVE — AB
Protein Ur, POC: 100 mg/dL — AB
Spec Grav, UA: 1.015 (ref 1.010–1.025)
Urobilinogen, UA: 0.2 E.U./dL
pH, UA: 7 (ref 5.0–8.0)

## 2017-12-25 MED ORDER — SULFAMETHOXAZOLE-TRIMETHOPRIM 800-160 MG PO TABS: 1.0000 | ORAL_TABLET | Freq: Two times a day (BID) | ORAL | 0 refills | Status: AC

## 2017-12-25 NOTE — Progress Notes (Signed)
[facility] 47 y.o.   Chief Complaint  Patient presents with  . Back Pain    on right side same as last visit 12/11/17    HISTORY OF PRESENT ILLNESS: [name] complaining of persistent pain to right posterior lateral rib cage for 5 to 6 weeks now.  Pain is sharp, constant, and worse with movement.  Better with rest.  No radiation.  No associated symptoms.  Denies any new injuries. Also wants to know if UTI is completely gone.  Urine culture from 12/04/2017 grew E. coli.  Treated with antibiotics.  Occasionally she notices a bad odor to her urine.  Denies any burning on urination.  HPI   Prior to Admission medications   Medication Sig Start Date End Date Taking? Authorizing Provider  escitalopram (LEXAPRO) 10 MG tablet TOME UNA TABLETA TODOS LOS DIAS 10/13/17  Yes Princess Bruins, MD  fluticasone (FLONASE) 50 MCG/ACT nasal spray Place 1 spray into both nostrils 2 (two) times daily. 06/22/17  Yes Rutherford Guys, MD  azelastine (ASTELIN) 0.1 % nasal spray Place 1 spray into both nostrils 2 (two) times daily. Use in each nostril as directed Patient not taking: Reported on 12/25/2017 06/22/17   Rutherford Guys, MD  cyclobenzaprine (FLEXERIL) 10 MG tablet Take 1 tablet (10 mg total) by mouth at bedtime. Patient not taking: Reported on 12/25/2017 12/04/17   Horald Pollen, MD  fluticasone [facility]) 50 MCG/ACT nasal spray SPRAY 2 SPRAYS INTO EACH NOSTRIL TODOS LOS DIAS 12/15/17   Rutherford Guys, MD  phentermine 37.5 MG capsule Take 1 capsule (37.5 mg total) by mouth every morning. Patient not taking: Reported on 12/25/2017 09/20/17   Princess Bruins, MD  [facility] HFA 108 [phone] Base) MCG/ACT inhaler PLEASE SEE ATTACHED FOR DETAILED DIRECTIONS Patient not taking: Reported on 12/25/2017 08/07/17   Wardell Honour, MD    Allergies  Allergen Reactions  . Latex Itching and Rash    Itching with use of condoms    Patient Active Problem List   Diagnosis Date Noted  . Chest  wall contusion, right, initial encounter 12/11/2017  . Back pain 12/04/2017  . Acute lumbar myofascial strain 12/04/2017  . Muscle spasm 12/04/2017  . Musculoskeletal pain 12/04/2017  . Skin infection 09/26/2017  . Acute laryngitis 05/17/2017  . Skin lesion of back 02/13/2017  . Intramural leiomyoma of uterus 02/29/2016  . GERD (gastroesophageal reflux disease) 10/19/2015  . Dysuria 06/11/2015  . History of pulmonary embolus (PE) 02/24/2015  . Sciatica of right side 11/24/2014  . Hx of pulmonary embolus during pregnancy 11/09/2014    Past Medical History:  Diagnosis Date  . Abnormal Pap smear    patient states she had cancer that was removed from her cervix  . Asthma    when pregnant/ once 6 yrs ago only time asthma attack the dr said  . Cancer ([place])   . Clotting disorder ([place])   . Gestational diabetes   . Intramural leiomyoma of uterus 02/29/2016  . Pulmonary embolism ([place])   . Renal insufficiency   . Vitamin D deficiency   . Vitamin D deficiency     Past Surgical History:  Procedure Laterality Date  . CHOLECYSTECTOMY      Social History   Socioeconomic History  . Marital status: Married    Spouse name: Not on file  . Number of children: 5  . Years of education: Not on file  . Highest education level: Not on file  Occupational History  . Occupation: Stay at [employer]  . Financial resource strain: Not on file  . Food insecurity:    Worry: Not on file    Inability: Not on file  . Transportation needs:    Medical: Not on file    Non-medical: Not on file  Tobacco Use  . Smoking status: Never Smoker  . Smokeless tobacco: Never Used  Substance and Sexual Activity  . Alcohol use: Yes    Alcohol/week: 0.0 standard drinks    Comment: Occasionally.  . Drug use: No  . Sexual activity: Yes    Partners: Male    Birth control/protection: Condom  Lifestyle  . Physical activity:    Days per week: Not on file    Minutes per session: Not on file  .  Stress: Not on file  Relationships  . Social connections:    Talks on phone: Not on file    Gets together: Not on file    Attends religious service: Not on file    Active member of club or organization: Not on file    Attends meetings of clubs or organizations: Not on file    Relationship status: Not on file  . Intimate partner violence:    Fear of current or ex partner: Not on file    Emotionally abused: Not on file    Physically abused: Not on file    Forced sexual activity: Not on file  Other Topics Concern  . Not on file  Social History Narrative   Married with five children. Came to [place] from [place] in 2001. Unemployed currently. Spanish speaking.    Family History  Problem Relation Age of Onset  . Diabetes Mother   . Hypertension Mother   . Hyperlipidemia Father   . Diabetes Father   . Hypertension Father   . Heart disease Sister   . Diabetes Brother   . Hyperlipidemia Brother   . Drug abuse Paternal Grandmother   . Kidney disease Other   . Birth defects Other        anal atresia/stenosis     Review of Systems  Constitutional: Negative.  Negative for chills and fever.  HENT: Negative.   Eyes: Negative.   Respiratory: Negative.  Negative for cough and shortness of breath.   Cardiovascular: Negative.  Negative for chest pain and palpitations.  Gastrointestinal: Negative.  Negative for abdominal pain, diarrhea, nausea and vomiting.  Genitourinary: Negative.   Musculoskeletal:       Right sided rib cage pain  Skin: Negative.  Negative for rash.  Neurological: Negative.  Negative for dizziness and headaches.  Endo/Heme/Allergies: Negative.   All other systems reviewed and are negative.   Vitals:   12/25/17 1113  BP: 107/71  Pulse: 92  Resp: 16  Temp: 98.3 F (36.8 C)  SpO2: 97%    Physical Exam  Constitutional: She is oriented to person, place, and time. She appears well-developed and well-nourished.  HENT:  Head: Normocephalic and atraumatic.    Eyes: Pupils are equal, round, and reactive to light. EOM are normal.  Neck: Normal range of motion. Neck supple.  Cardiovascular: Normal rate, regular rhythm and normal heart sounds.  Pulmonary/Chest: Effort normal and breath sounds normal. She exhibits tenderness (Right posterior lateral mid rib cage).  Abdominal: There is no tenderness.  Musculoskeletal: Normal range of motion.  Neurological: She is alert and oriented to person, place, and time. No sensory deficit. She exhibits normal muscle tone.  Skin: Skin  is warm and dry. Capillary refill takes less than 2 seconds.  Psychiatric: She has a normal mood and affect. Her behavior is normal.  Vitals reviewed.  A total of 25 minutes was spent in the room with the patient, greater than 50% of which was in counseling/coordination of care regarding differential diagnosis, treatment, medications, and need for follow-up if no better or worse.   ASSESSMENT & PLAN: Kim [name] was seen today for back pain.  Diagnoses and all orders for this visit:  Musculoskeletal pain  Muscle strain of chest wall, sequela  History of UTI -     Urine Culture -     POCT urinalysis dipstick  Acute UTI -     sulfamethoxazole-trimethoprim (BACTRIM DS,SEPTRA DS) 800-160 MG tablet; Take 1 tablet by mouth 2 (two) times daily for 7 days.    Patient Instructions  Distensin muscular. (Muscle Strain) Una distensin muscular (estiramiento muscular) ocurre cuando un msculo se estira ms all de la longitud normal. Mahlon Gammon cuando una fuerza violenta bruscamente estira demasiado el msculo. Generalmente se desgarran algunas de las fibras del msculo. La distensin muscular es comn en los atletas. La recuperacin normalmente tarda de 1 a 2semanas. La curacin completa tarda de 5 a 6semanas. CUIDADOS EN EL HOGAR  Siga el mtodo PRICE (por sus siglas en ingls) de tratamiento para que la lesin mejore. Hgalo durante los 2 a 3 primeros das despus de la  lesin: ? Engineer, maintenance. Proteja el msculo para evitar que se vuelva a lesionar. ? Reposo. Limite la actividad y descanse la parte del cuerpo lesionada. ? Hielo. Ponga el hielo en una bolsa plstica. Coloque una toalla entre la piel y la bolsa de hielo. Luego aplique el hielo y djelo actuar de 103 a 59minutos por hora. Despus del Building control surveyor, cambie a compresas de calor hmedo. ? Compresin. Use una frula o venda elstica en la zona lesionada para brindar alivio. No la ajuste demasiado. ? Elevacin. Eleve la zona lesionada por encima del nivel del corazn.  Solo tome los medicamentos que le haya indicado su mdico.  Realice un calentamiento antes de hacer ejercicio para prevenir distensiones musculares futuras.  SOLICITE AYUDA SI:  Siente ms dolor o inflamacin (hinchazn) en la zona lesionada.  Siente adormecimiento, hormigueo o nota una prdida de fuerza en la zona lesionada.  ASEGRESE DE QUE:  Comprende estas instrucciones.  Controlar su afeccin.  Recibir ayuda de inmediato si no mejora o si empeora.  Esta informacin no tiene Marine scientist el consejo del mdico. Asegrese de hacerle al mdico cualquier pregunta que tenga. Document Released: 07/01/2008 Document Revised: 01/23/2013 Document Reviewed: 11/01/2012 Elsevier Interactive Patient Education  2017 [place] en los adultos (Urinary Tract Infection, Adult) Una infeccin urinaria (IU) puede ocurrir en Clinical cytogeneticist de las vas urinarias. Las vas urinarias incluyen lo siguiente:  Riones.  Urteres.  Vejiga.  Uretra. Estos rganos fabrican, Buyer, retail y eliminan la orina del organismo. Purdy los medicamentos de venta libre y los recetados solamente como se lo haya indicado el mdico.  Si le recetaron un antibitico, tmelo como se lo haya indicado el mdico. No deje de tomar los antibiticos aunque comience a sentirse mejor.  Evite beber lo  siguiente: ? Alcohol. ? Cafena. ? T. ? Bebidas con gas.  Beba suficiente lquido para mantener el pis claro o de color amarillo plido.  Concurra a todas las visitas de control como se lo haya indicado el mdico. Esto es importante.  Asegrese de lo siguiente: ? Vaciar la vejiga con frecuencia y en su totalidad. No contener la orina durante largos perodos. ? Vaciar la vejiga antes y despus de Clinical biochemist. ? Limpiar de adelante hacia atrs despus de defecar, si es mujer. Usar cada trozo de papel una vez cuando se limpie. SOLICITE AYUDA SI:  Siente dolor en la espalda.  Tiene fiebre.  Siente malestar estomacal (nuseas).  Vomita.  Los sntomas no mejoran despus de 3das de tratamiento.  Los sntomas desaparecen y Teacher, adult education. SOLICITE AYUDA DE INMEDIATO SI:  Siente un dolor muy intenso en la espalda.  Siente un dolor muy intenso en la parte inferior del abdomen.  Tiene vmitos y no puede retener los medicamentos ni el agua. Esta informacin no tiene Marine scientist el consejo del mdico. Asegrese de hacerle al mdico cualquier pregunta que tenga. Document Released: 09/22/2009 Document Revised: 07/27/2015 Document Reviewed: 02/23/2015 Elsevier Interactive Patient Education  2018 Elsevier Inc.       Agustina Caroli, MD [facility]

## 2017-12-25 NOTE — Patient Instructions (Addendum)
Distensin muscular. (Muscle Strain) Una distensin muscular (estiramiento muscular) ocurre cuando un msculo se estira ms all de la longitud normal. Mahlon Gammon cuando una fuerza violenta bruscamente estira demasiado el msculo. Generalmente se desgarran algunas de las fibras del msculo. La distensin muscular es comn en los atletas. La recuperacin normalmente tarda de 1 a 2semanas. La curacin completa tarda de 5 a 6semanas. CUIDADOS EN EL HOGAR  Siga el mtodo PRICE (por sus siglas en ingls) de tratamiento para que la lesin mejore. Hgalo durante los 2 a 3 primeros das despus de la lesin: ? Engineer, maintenance. Proteja el msculo para evitar que se vuelva a lesionar. ? Reposo. Limite la actividad y descanse la parte del cuerpo lesionada. ? Hielo. Ponga el hielo en una bolsa plstica. Coloque una toalla entre la piel y la bolsa de hielo. Luego aplique el hielo y djelo actuar de 9 a 91minutos por hora. Despus del Building control surveyor, cambie a compresas de calor hmedo. ? Compresin. Use una frula o venda elstica en la zona lesionada para brindar alivio. No la ajuste demasiado. ? Elevacin. Eleve la zona lesionada por encima del nivel del corazn.  Solo tome los medicamentos que le haya indicado su mdico.  Realice un calentamiento antes de hacer ejercicio para prevenir distensiones musculares futuras.  SOLICITE AYUDA SI:  Siente ms dolor o inflamacin (hinchazn) en la zona lesionada.  Siente adormecimiento, hormigueo o nota una prdida de fuerza en la zona lesionada.  ASEGRESE DE QUE:  Comprende estas instrucciones.  Controlar su afeccin.  Recibir ayuda de inmediato si no mejora o si empeora.  Esta informacin no tiene Marine scientist el consejo del mdico. Asegrese de hacerle al mdico cualquier pregunta que tenga. Document Released: 07/01/2008 Document Revised: 01/23/2013 Document Reviewed: 11/01/2012 Elsevier Interactive Patient Education  2017 [place] en los adultos (Urinary Tract Infection, Adult) Una infeccin urinaria (IU) puede ocurrir en Clinical cytogeneticist de las vas urinarias. Las vas urinarias incluyen lo siguiente:  Riones.  Urteres.  Vejiga.  Uretra. Estos rganos fabrican, Buyer, retail y eliminan la orina del organismo. [place] los medicamentos de venta libre y los recetados solamente como se lo haya indicado el mdico.  Si le recetaron un antibitico, tmelo como se lo haya indicado el mdico. No deje de tomar los antibiticos aunque comience a sentirse mejor.  Evite beber lo siguiente: ? Alcohol. ? Cafena. ? T. ? Bebidas con gas.  Beba suficiente lquido para mantener el pis claro o de color amarillo plido.  Concurra a todas las visitas de control como se lo haya indicado el mdico. Esto es importante.  Asegrese de lo siguiente: ? Vaciar la vejiga con frecuencia y en su totalidad. No contener la orina durante largos perodos. ? Vaciar la vejiga antes y despus de Clinical biochemist. ? Limpiar de adelante hacia atrs despus de defecar, si es mujer. Usar cada trozo de papel una vez cuando se limpie. SOLICITE AYUDA SI:  Siente dolor en la espalda.  Tiene fiebre.  Siente malestar estomacal (nuseas).  Vomita.  Los sntomas no mejoran despus de 3das de tratamiento.  Los sntomas desaparecen y Teacher, adult education. SOLICITE AYUDA DE INMEDIATO SI:  Siente un dolor muy intenso en la espalda.  Siente un dolor muy intenso en la parte inferior del abdomen.  Tiene vmitos y no puede retener los medicamentos ni el agua. Esta informacin no tiene Marine scientist el consejo del mdico. Asegrese de hacerle al mdico cualquier pregunta que tenga.  Document Released: 09/22/2009 Document Revised: 07/27/2015 Document Reviewed: 02/23/2015 Elsevier Interactive Patient Education  [employer].

## 2017-12-27 ENCOUNTER — Encounter: Payer: Self-pay | Admitting: *Deleted

## 2017-12-27 ENCOUNTER — Telehealth: Payer: Self-pay | Admitting: Emergency Medicine

## 2017-12-27 LAB — URINE CULTURE

## 2017-12-27 NOTE — Progress Notes (Signed)
Letter sent.

## 2017-12-27 NOTE — Telephone Encounter (Signed)
Urine culture results discussed with patient.  Advised to continue and finish antibiotic.  Return to the office if no better or worse.

## 2018-01-02 ENCOUNTER — Encounter: Payer: Self-pay | Admitting: Obstetrics & Gynecology

## 2018-01-02 ENCOUNTER — Ambulatory Visit: Payer: BLUE CROSS/BLUE SHIELD | Admitting: Obstetrics & Gynecology

## 2018-01-02 VITALS — BP 118/78 | Ht 64.0 in | Wt 190.0 lb

## 2018-01-02 DIAGNOSIS — M25541 Pain in joints of right hand: Secondary | ICD-10-CM

## 2018-01-02 DIAGNOSIS — M25542 Pain in joints of left hand: Secondary | ICD-10-CM

## 2018-01-02 DIAGNOSIS — Z6832 Body mass index (BMI) 32.0-32.9, adult: Secondary | ICD-10-CM

## 2018-01-02 DIAGNOSIS — E6609 Other obesity due to excess calories: Secondary | ICD-10-CM

## 2018-01-02 MED ORDER — LORCASERIN HCL 10 MG PO TABS: 10.0000 mg | ORAL_TABLET | Freq: Two times a day (BID) | ORAL | 2 refills | Status: DC

## 2018-01-02 NOTE — Progress Notes (Signed)
    [place] 06-Apr-1971 [id number]        47 y.o.  [id number]   RP: Weight loss management  HPI: Lost 5 Lbs since last visit 10/2017.  Exercising regularly, but hurt her left thorax 2 weeks ago, so had to slow down.  Eating a low calorie diet, cut the carbs.  C/O joint pains at fingers and knees.  BMI 32.61   OB History  Gravida Para Term Preterm AB Living  5 5 5     5   SAB TAB Ectopic Multiple Live Births        0 4    # Outcome Date GA Lbr Len/2nd Weight Sex Delivery Anes PTL Lv  5 Term 11/04/14 [redacted]w[redacted]d  7 lb 8.5 oz (3.416 kg) F Vag-Spont EPI  LIV  4 Term 03/18/08 [redacted]w[redacted]d    Vag-Spont  N LIV  3 Term 11/07/00 101w0d    Vag-Spont  N LIV  2 Term 07/09/90 [redacted]w[redacted]d    Vag-Spont  N LIV  1 Term 08/24/89 [redacted]w[redacted]d    Vag-Spont       Past medical history,surgical history, problem list, medications, allergies, family history and social history were all reviewed and documented in the EPIC chart.   Directed ROS with pertinent positives and negatives documented in the history of present illness/assessment and plan.  Exam:  Vitals:   01/02/18 0917  BP: 118/78  Weight: 190 lb (86.2 kg)  Height: 5\' 4"  (1.626 m)   General appearance:  Normal   Assessment/Plan:  47 y.o. [id number]   1. Class 1 obesity due to excess calories without serious comorbidity with body mass index (BMI) of 32.0 to 32.9 in adult Successfully losing weight but at a very slow rate.  Encouraged to continue with a low calorie/carb diet such as [employer] or to join [employer] which would probably be a better way to have ongoing support and stimulation.  Started on lorcaserin 10 mg twice a day.  Usage and risks reviewed with patient.  Prescription sent to pharmacy.  Aerobic physical activity 5 times a week and weightlifting every 2 days recommended.  2. Arthralgia of both hands Refer to rheumatology.  Other orders - Lorcaserin HCl 10 MG TABS; Take 10 mg by mouth 2 (two) times daily.  Counseling on above  issues and coordination of care more than 50% for 25 minutes.  Princess Bruins MD, 9:47 AM 01/02/2018

## 2018-01-06 ENCOUNTER — Encounter: Payer: Self-pay | Admitting: Obstetrics & Gynecology

## 2018-01-06 NOTE — Patient Instructions (Signed)
1. Class 1 obesity due to excess calories without serious comorbidity with body mass index (BMI) of 32.0 to 32.9 in adult Successfully losing weight but at a very slow rate.  Encouraged to continue with a low calorie/carb diet such as [employer] or to join [employer] which would probably be a better way to have ongoing support and stimulation.  Started on lorcaserin 10 mg twice a day.  Usage and risks reviewed with patient.  Prescription sent to pharmacy.  Aerobic physical activity 5 times a week and weightlifting every 2 days recommended.  2. Arthralgia of both hands Refer to rheumatology.  Other orders - Lorcaserin HCl 10 MG TABS; Take 10 mg by mouth 2 (two) times daily.  Kim [name], it was a pleasure seeing you today!

## 2018-01-08 ENCOUNTER — Telehealth: Payer: Self-pay | Admitting: *Deleted

## 2018-01-08 NOTE — Telephone Encounter (Signed)
Referral faxed to [facility] they will call patient to schedule and fax me with time and date.

## 2018-01-08 NOTE — Telephone Encounter (Signed)
-----   Message from Princess Bruins, MD sent at 01/02/2018 10:15 AM EDT ----- Regarding: Refer to Rheumatologist Finger joints pains c/w Rheumatoid Arthritis and new knee pain.

## 2018-01-09 NOTE — Telephone Encounter (Signed)
Patient scheduled on 01/25/18 @ 9:00am with Marella Chimes PA-C.

## 2018-01-30 ENCOUNTER — Emergency Department (HOSPITAL_COMMUNITY)
Admission: EM | Admit: 2018-01-30 | Discharge: 2018-01-30 | Disposition: A | Discharge status: Home / Self Care | Payer: BLUE CROSS/BLUE SHIELD | Attending: Emergency Medicine | Admitting: Emergency Medicine

## 2018-01-30 ENCOUNTER — Emergency Department (HOSPITAL_COMMUNITY): Payer: BLUE CROSS/BLUE SHIELD

## 2018-01-30 ENCOUNTER — Encounter (HOSPITAL_COMMUNITY): Payer: Self-pay | Admitting: Emergency Medicine

## 2018-01-30 DIAGNOSIS — M546 Pain in thoracic spine: Secondary | ICD-10-CM | POA: Diagnosis not present

## 2018-01-30 DIAGNOSIS — R51 Headache: Secondary | ICD-10-CM | POA: Diagnosis not present

## 2018-01-30 DIAGNOSIS — S0990XA Unspecified injury of head, initial encounter: Secondary | ICD-10-CM | POA: Diagnosis not present

## 2018-01-30 DIAGNOSIS — M79671 Pain in right foot: Secondary | ICD-10-CM | POA: Diagnosis not present

## 2018-01-30 DIAGNOSIS — T148XXA Other injury of unspecified body region, initial encounter: Secondary | ICD-10-CM

## 2018-01-30 DIAGNOSIS — R519 Headache, unspecified: Secondary | ICD-10-CM

## 2018-01-30 DIAGNOSIS — Y92410 Unspecified street and highway as the place of occurrence of the external cause: Secondary | ICD-10-CM | POA: Diagnosis not present

## 2018-01-30 DIAGNOSIS — Z79899 Other long term (current) drug therapy: Secondary | ICD-10-CM | POA: Insufficient documentation

## 2018-01-30 DIAGNOSIS — S99921A Unspecified injury of right foot, initial encounter: Secondary | ICD-10-CM | POA: Diagnosis not present

## 2018-01-30 DIAGNOSIS — S161XXA Strain of muscle, fascia and tendon at neck level, initial encounter: Secondary | ICD-10-CM | POA: Diagnosis not present

## 2018-01-30 DIAGNOSIS — M545 Low back pain, unspecified: Secondary | ICD-10-CM

## 2018-01-30 DIAGNOSIS — S39012A Strain of muscle, fascia and tendon of lower back, initial encounter: Secondary | ICD-10-CM | POA: Diagnosis not present

## 2018-01-30 DIAGNOSIS — S3992XA Unspecified injury of lower back, initial encounter: Secondary | ICD-10-CM | POA: Diagnosis not present

## 2018-01-30 DIAGNOSIS — M542 Cervicalgia: Secondary | ICD-10-CM

## 2018-01-30 DIAGNOSIS — S199XXA Unspecified injury of neck, initial encounter: Secondary | ICD-10-CM | POA: Diagnosis not present

## 2018-01-30 DIAGNOSIS — Y999 Unspecified external cause status: Secondary | ICD-10-CM | POA: Diagnosis not present

## 2018-01-30 DIAGNOSIS — Y9389 Activity, other specified: Secondary | ICD-10-CM | POA: Insufficient documentation

## 2018-01-30 DIAGNOSIS — S299XXA Unspecified injury of thorax, initial encounter: Secondary | ICD-10-CM | POA: Diagnosis not present

## 2018-01-30 DIAGNOSIS — R52 Pain, unspecified: Secondary | ICD-10-CM | POA: Diagnosis not present

## 2018-01-30 MED ORDER — IBUPROFEN 600 MG PO TABS: 600.0000 mg | ORAL_TABLET | Freq: Four times a day (QID) | ORAL | 0 refills | Status: DC | PRN

## 2018-01-30 MED ORDER — ACETAMINOPHEN 500 MG PO TABS
1000.0000 mg | ORAL_TABLET | Freq: Once | ORAL | Status: AC
  Administered 2018-01-30: 1000 mg via ORAL
  Filled 2018-01-30: qty 2

## 2018-01-30 MED ORDER — METHOCARBAMOL 500 MG PO TABS: 500.0000 mg | ORAL_TABLET | Freq: Two times a day (BID) | ORAL | 0 refills | Status: DC

## 2018-01-30 MED ORDER — HYDROCODONE-ACETAMINOPHEN 5-325 MG PO TABS
1.0000 | ORAL_TABLET | Freq: Once | ORAL | Status: AC
  Administered 2018-01-30: 1 via ORAL
  Filled 2018-01-30: qty 1

## 2018-01-30 MED ORDER — KETOROLAC TROMETHAMINE 30 MG/ML IJ SOLN
30.0000 mg | Freq: Once | INTRAMUSCULAR | Status: AC
  Administered 2018-01-30: 30 mg via INTRAMUSCULAR
  Filled 2018-01-30: qty 1

## 2018-01-30 MED ORDER — METHOCARBAMOL 500 MG PO TABS
1000.0000 mg | ORAL_TABLET | Freq: Once | ORAL | Status: AC
  Administered 2018-01-30: 1000 mg via ORAL
  Filled 2018-01-30: qty 2
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
## 2018-01-30 NOTE — ED Notes (Signed)
Patient transported to X-ray 
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
## 2018-01-30 NOTE — Discharge Instructions (Signed)
The pain your experiencing is likely due to muscle strain, you may take Ibuprofen and Robaxin as needed for pain management. Do not combine with any pain reliever other than tylenol. The muscle soreness should improve over the next week. Follow up with your family doctor in the next week for a recheck if you are still having symptoms. Return to ED if pain is worsening, you develop weakness or numbness of extremities, or new or concerning symptoms develop.

## 2018-02-02 ENCOUNTER — Encounter: Payer: Self-pay | Admitting: Family Medicine

## 2018-02-02 ENCOUNTER — Ambulatory Visit: Payer: BLUE CROSS/BLUE SHIELD | Admitting: Family Medicine

## 2018-02-02 ENCOUNTER — Other Ambulatory Visit: Payer: Self-pay

## 2018-02-02 VITALS — BP 121/83 | HR 68 | Temp 97.8°F | Ht 64.5 in | Wt 182.6 lb

## 2018-02-02 DIAGNOSIS — S060X0A Concussion without loss of consciousness, initial encounter: Secondary | ICD-10-CM

## 2018-02-02 DIAGNOSIS — G44309 Post-traumatic headache, unspecified, not intractable: Secondary | ICD-10-CM | POA: Diagnosis not present
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
## 2018-02-02 NOTE — Patient Instructions (Addendum)
No trabaje a esta tiempo. descansa y regrese en 3 dias. va a cuarto de emergencia si empeoran.   Conmocin cerebral en los adultos Concussion, Adult Kim [name] conmocin cerebral es una lesin cerebral a causa de un impacto(golpe) directo en la cabeza o el cuerpo. Este golpe hace que el cerebro se [name] atrs y hacia adelante con rapidez dentro del crneo. En consecuencia, se pueden daar las clulas cerebrales y producir cambios qumicos en el cerebro. Una conmocin cerebral tambin podra conocerse como lesin cerebral traumtica (LCT) leve. Por lo general, las conmociones cerebrales no ponen en riesgo la vida, pero sus consecuencias pueden ser graves. Si ya sufri una conmocin cerebral, es ms probable que experimente sntomas similares a los de una conmocin despus de un golpe directo en la cabeza, en el futuro. Cules son las causas? Las causas de esta afeccin son las siguientes:  Un golpe directo en la cabeza, como al chocar contra otro jugador en un partido, al recibir un golpe en una lucha o al golpearse la cabeza contra una superficie dura.  Una sacudida de la cabeza o el cuello que hace que el cerebro se mueva hacia adelante y [place] atrs dentro del crneo, como en un choque automovilstico.  [name] son los signos o los sntomas? Los signos de una conmocin cerebral pueden ser difciles de Teacher, adult education. En un primer momento, los pacientes, familiares y profesionales tal vez no los adviertan. Puede ser que aparentemente est normal pero que acte o se sienta diferente. Por lo general, los sntomas son transitorios pero pueden durar [name], semanas o an ms. Algunos sntomas podran aparecer de inmediato; sin embargo, otros quizs no se manifiesten sino hasta dentro de horas o das. Cada lesin en la cabeza es diferente. Entre los sntomas se pueden incluir los siguientes:  Dolores de [place]. Podra incluir una sensacin de presin en la cabeza.  Problemas de  memoria.  Dificultad para concentrarse, organizar o tomar decisiones.  Lentitud para pensar, actuar o reaccionar, hablar o leer.  Confusin.  Fatiga.  Cambios en los hbitos de alimentacin o en el sueo.  Problemas de coordinacin o equilibrio.  Nuseas o vmitos.  Adormecimiento u hormigueo.  Sensibilidad a la luz o los ruidos.  Problemas de visin o audicin.  Prdida del sentido del olfato.  Irritabilidad o cambios de humor.  Mareos.  Falta de motivacin.  Ver o escuchar cosas que otras personas no ven ni escuchan (alucinaciones).  Cmo se diagnostica? Esta afeccin se diagnostica en funcin de lo siguiente:  Sus sntomas.  Descripcin de la lesin.  Tambin pueden hacerle exmenes que incluyen:  Pruebas de diagnstico por imgenes, como resonancia magntica (RM) o exploracin por tomografa computarizada (TC). Estos estudios se realizan para [name] signos de lesin cerebral.  Estudios neuropsicolgicos. Con ellos, se examinan las capacidades de pensar, comprender, aprender y Nurse, learning disability.  Cmo se trata esta afeccin? El tratamiento consiste en el reposo fsico y mental, y en la observacin atenta del nio, habitualmente en Engineer, mining. Si la conmocin cerebral es grave, es posible que Patent attorney en su hogar y no volver al trabajo durante un [name]. Quizs lo deriven a una clnica especializada en conmociones cerebrales o a otros mdicos especializados en este tipo de tratamientos. Es importante que le avise al mdico si algo de lo siguiente es cierto:  Toma medicamentos, incluidos medicamentos recetados, medicamentos de venta sin receta y remedios naturales. Algunos medicamentos, como los anticoagulantes y la aspirina, podran aumentar la probabilidad de sufrir complicaciones, [name]  hemorragias.  Consume o ha consumido alcohol o drogas. El alcohol y algunas otras sustancias pueden demorar su recuperacin y ponerlo en riesgo de nuevas lesiones.  El tiempo que  tarde en recuperarse de una conmocin cerebral depender de muchos factores; por ejemplo, la gravedad de la conmocin cerebral, la zona del cerebro lesionada, su edad y el estado de salud previo a la conmocin cerebral. La recuperacin puede llevar tiempo. Para regresar a las actividades, es importante esperar hasta que el mdico lo autorice y [employer] los sntomas hayan desaparecido por completo. Siga estas indicaciones en su casa: Actividad  Limite las actividades que requieren mucha atencin o Estate manager/land agent. Estas pueden incluir lo siguiente: ? Tareas para el hogar o trabajos relacionados con el empleo. ? Mirar televisin. ? Trabajar en la computadora. ? Jugar juegos de memoria y armar rompecabezas.  Reposo. El descanso favorece la curacin del cerebro. Haga lo siguiente: ? Duerma bien por la noche. Evite quedarse despierto hasta muy tarde por la noche. ? Durmase a la [employer]. ? Eleele. Tome siestas o haga pausas cuando se sienta cansado.  Puede ser peligroso que el nio sufra otra conmocin cerebral antes de que se haya recuperado de la primera. No realice actividades de alto riesgo que pudieran provocar una segunda conmocin cerebral, como andar en bicicleta o practicar deportes.  Pregntele al mdico cundo podr reanudar sus actividades habituales, como ir a la escuela, trabajar, Psychologist, prison and probation services deportes, conducir, andar en bicicleta o utilizar maquinaria pesada. La capacidad para reaccionar puede ser ms lenta luego de una lesin cerebral. Nunca realice estas actividades si se siente mareado. Posiblemente, el mdico le d un plan para retomar las actividades de forma gradual. Instrucciones generales  [employer] medicamentos de venta libre y los recetados solamente como se lo haya indicado el mdico.  No beba alcohol hasta que el mdico se lo autorice.  Si le resulta ms difcil que lo habitual recordar las cosas, escrbalas.  Trate de hacer una cosa por  vez si se distrae con facilidad. Por ejemplo, no mire televisin mientras prepara la cena.  Consulte con familiares y amigos si debe tomar decisiones importantes.  Controle sus sntomas y pdale a otras personas que tambin lo hagan. En algunos casos pueden aparecer complicaciones luego de una conmocin cerebral. Los adultos mayores con lesiones cerebrales podran tener un mayor riesgo de sufrir complicaciones graves, como un cogulo de Biochemist, clinical en el cerebro.  Informe a los [employer], el departamento de enfermera de la escuela, el consejero escolar, Therapist, nutritional acerca de su lesin, los sntomas y las restricciones que tiene. Hgales saber lo que puede y lo que no Fish farm manager. Ellos debern observar: ? Aumento en los problemas de atencin o Estate manager/land agent. ? Ms dificultades para recordar o aprender informacin nueva. ? Aumento del tiempo que necesita para completar tareas o encargos. ? Aumento de la irritabilidad o disminucin de la capacidad para Animal nutritionist. ? Aparicin de nuevos sntomas.  Concurra a todas las visitas de control como se lo haya indicado el mdico. Esto es importante. Cmo se previene? Es muy importante que evite otra lesin cerebral; en especial, mientras se recupera. En casos raros, una nueva lesin puede causar daos cerebrales permanentes, hinchazn del cerebro o la muerte. El riesgo es mayor durante los primeros 7 a 10 das despus de una lesin en la cabeza. Evite las lesiones:  Use el cinturn de seguridad al conducir un automvil.  Use un casco cuando ande  en bicicleta, esque, patine o realice actividades similares.  Evite actividades que podran causar una segunda conmocin cerebral, como deportes de contacto o recreativos hasta que su mdico lo autorice.  Tome medidas de seguridad en el hogar, por ejemplo: ? No tenga desorden en pisos y escaleras. ? Coloque barras para sostn en los baos y [employer] en las escaleras. ? Ponga alfombras  antideslizantes en pisos y baeras. ? Mejore la iluminacin en zonas de penumbra.  Comunquese con un mdico si:  Los sntomas empeoran.  Aparecen nuevos sntomas.  Contina teniendo sntomas durante ms de 2semanas. Solicite ayuda de inmediato si:  Los dolores de [place] son intensos o TEFL teacher.  Siente debilidad o adormecimiento en alguna parte del cuerpo.  Su coordinacin empeora.  Vomita repetidas veces.  Se siente ms somnoliento.  La pupila de un ojo est ms grande que la del otro ojo.  Sufre convulsiones o espasmos.  Arrastra las palabras.  La fatiga, la confusin o la irritabilidad empeoran.  No puede Recruitment consultant o lugares.  Siente dolor en el cuello.  Le resulta difcil despertarse.  Tiene cambios de conducta inusuales.  Pierde la conciencia. Resumen  Una conmocin cerebral es una lesin cerebral a causa de un impacto(golpe) directo en la cabeza o el cuerpo.  Una conmocin cerebral tambin podra llamarse lesin cerebral traumtica (LCT) leve.  Podran realizarle estudios de diagnstico por imgenes y estudios neuropsicolgicos para diagnosticar la conmocin cerebral.  El tratamiento consiste en el reposo fsico y mental, y en la observacin atenta.  Pregntele al mdico cundo podr reanudar sus actividades habituales, como ir a la escuela, trabajar, Psychologist, prison and probation services deportes, conducir, andar en bicicleta o utilizar maquinaria pesada. Siga las instrucciones de seguridad que le haya indicado el mdico. Esta informacin no tiene Marine scientist el consejo del mdico. Asegrese de hacerle al mdico cualquier pregunta que tenga. Document Released: 03/17/2008 Document Revised: 07/06/2016 Document Reviewed: 10/25/2012 Elsevier Interactive Patient Education  2018 [place] por Kim [name] colisin entre vehculos motorizados Furniture conservator/restorer Injury) Tras un accidente automovilstico (colisin con un vehculo motorizado), las  lesiones en el rostro, los brazos y el cuerpo son frecuentes. Estos lesiones pueden incluir lo siguiente:  Cortes.  Quemaduras.  Moretones.  Dolores musculares. Las molestias y el dolor causados por estas lesiones son peores durante las primeras 24 a 22 horas. En las primeras horas, probablemente sienta mayor rigidez y Social research officer, government. Tambin puede sentirse peor al despertarse la maana posterior al accidente. Despus de eso, generalmente empezar a sentirse Geophysical data processor. La rapidez con la que mejore suele depender de lo siguiente:  La gravedad del accidente.  La cantidad de lesiones que sufri.  Dnde tiene las lesiones.  Qu tipo de lesiones tiene.  Si se [place] el airbag. [place] y [employer] medicamentos de venta libre y los recetados solamente como se lo haya indicado el mdico.  Si le recetaron un antibitico, tmelo o aplqueselo como se lo haya indicado el mdico. No deje de usar el antibitico aunque la afeccin mejore. Si tiene una herida o una quemadura:  Limpie la herida o la quemadura como se lo haya indicado el mdico. ? Lave con agua y [place]. ? Enjuguela con agua para sacar todo el jabn. ? Seque dando palmaditas con un pao limpio y seco. No la frote.  Siga las indicaciones del mdico en lo que respecta al cuidado de la herida o la [place]. Asegrese de lo siguiente: ? Lvese  las manos con agua y jabn antes de Quarry manager las vendas (vendaje). Use un desinfectante para manos si no dispone de [place] y [place]. ? Cambie la venda como se lo haya indicado el mdico. ? No retire los puntos (suturas), el [employer] para la piel ni las tiras [place], si los tiene. Tal vez deban dejarse puestos en la piel durante 2semanas o ms tiempo. Si las tiras [place] se despegan y se enroscan, puede recortar los bordes sueltos. No retire las tiras [facility] por completo a menos que el mdico lo autorice.  No se rasque ni se toque la herida o  [place].  No se reviente las ampollas que se puedan haber formado. No se quite las escamas de piel.  No exponga la herida o la quemadura a la luz del sol.  Cuando est sentado o acostado, eleve la zona de la herida o [place] por encima del nivel del corazn. Si la herida o la quemadura est en el rostro, se recomienda dormir con la cabeza elevada. Puede colocar una almohada extra debajo de la cabeza.  Baker para detectar signos de infeccin. Est atento a lo siguiente: ? Dolor, hinchazn o enrojecimiento. ? Lquido, sangre o pus. ? [place]or. ? [employer]. Instrucciones generales  Si se lo indican, pngase hielo en los ojos, el rostro, el tronco (torso) u Civil engineer, contracting. ? Ponga el hielo en una bolsa plstica. ? Coloque una [employer] piel y la bolsa de hielo. ? Coloque el hielo durante 38minutos, 2 a 3veces por da.  Beba suficiente lquido para Consulting civil engineer orina clara o de color amarillo plido.  No beba alcohol.  Pregntele al mdico si hay lmites respecto de cunto Garment/textile technologist.  Reposo. El descanso ayuda a su cuerpo a Scientist, research (medical). Asegrese de lo siguiente: ? Duerma bien por la noche. Evite quedarse despierto hasta muy tarde por la noche. ? Acustese a la [employer] Engineering fines de semana y [employer].  Pregntele al mdico cundo puede conducir, andar en bicicleta o usar maquinaria pesada. No realice estas actividades si se siente mareado. SOLICITE AYUDA SI:  Los sntomas empeoran.  Tiene cualquiera de los siguientes sntomas durante ms de [employer] despus del accidente automovilstico: ? Dolores de cabeza que perduran (crnicos). ? Mareos o problemas de equilibrio. ? Ganas de vomitar (nuseas). ? Problemas de visin. ? Ms sensibilidad a los ruidos o a Naval architect. ? Depresin y cambios en el estado de nimo. ? Estar preocupado o nervioso (ansioso). ? Se molesta o se enoja con facilidad. ? Problemas de  memoria. ? Dificultad para prestar atencin o concentrarse. ? Problemas para dormir. ? [employer]. SOLICITE AYUDA DE INMEDIATO SI:  Tiene los siguientes sntomas: ? Adormecimiento, hormigueo o debilidad en los brazos o las piernas. ? Dolor intenso en el cuello, especialmente con la palpacin en el medio de la parte posterior del cuello. ? Un cambio en la capacidad de controlar la miccin (orina) o la defecacin (heces). ? Aumento del dolor en cualquier parte del cuerpo. ? Falta de aire o sensacin de desvanecimiento. ? Dolor en el pecho. ? Sangre en la orina, en las heces o en el vmito. ? Dolor muy intenso en el vientre (abdomen) o en la espalda. ? Dolores de [place] muy intensos o que empeoran. ? Prdida repentina de la visin o visin doble.  El ojo se enrojece repentinamente.  La parte negra situada en el centro del ojo (pupila) tiene Kim [name]  forma o un tamao extraos. Esta informacin no tiene Marine scientist el consejo del mdico. Asegrese de hacerle al mdico cualquier pregunta que tenga. Document Released: 05/07/2010 Document Revised: 06/27/2011 Document Reviewed: 10/17/2014 Elsevier Interactive Patient Education  [employer].     If you have lab work done today you will be contacted with your lab results within the next 2 weeks.  If you have not heard from [place] then please contact us. The fastest way to get your results is to register for My Chart.   IF you received an x-ray today, you will receive an invoice from [facility]. Please contact [facility] at [phone] with questions or concerns regarding your invoice.   IF you received labwork today, you will receive an invoice from [place]. Please contact [facility] at [phone] with questions or concerns regarding your invoice.   Our billing staff will not be able to assist you with questions regarding bills from these companies.  You will be contacted with the lab results as soon  as they are available. The fastest way to get your results is to activate your My Chart account. Instructions are located on the last page of this paperwork. If you have not heard from [place] regarding the results in 2 weeks, please contact this office.

## 2018-02-06 ENCOUNTER — Encounter: Payer: Self-pay | Admitting: Family Medicine

## 2018-02-07 ENCOUNTER — Ambulatory Visit: Payer: BLUE CROSS/BLUE SHIELD | Admitting: Family Medicine

## 2018-02-07 ENCOUNTER — Encounter: Payer: Self-pay | Admitting: Family Medicine

## 2018-02-07 ENCOUNTER — Other Ambulatory Visit: Payer: Self-pay

## 2018-02-07 VITALS — BP 124/79 | HR 65 | Temp 98.5°F | Resp 20 | Ht 64.06 in | Wt 182.4 lb

## 2018-02-07 DIAGNOSIS — G44309 Post-traumatic headache, unspecified, not intractable: Secondary | ICD-10-CM

## 2018-02-07 DIAGNOSIS — Z8659 Personal history of other mental and behavioral disorders: Secondary | ICD-10-CM

## 2018-02-07 DIAGNOSIS — M542 Cervicalgia: Secondary | ICD-10-CM | POA: Diagnosis not present

## 2018-02-07 MED ORDER — AMITRIPTYLINE HCL 10 MG PO TABS: 10.0000 mg | ORAL_TABLET | Freq: Every day | ORAL | 0 refills | Status: DC

## 2018-02-07 NOTE — Patient Instructions (Addendum)
Descansa - minimo luz y ruido si es posible. Voy a referir a Therapist, art de cabeza.   Amitriptyline cada noche.  No tome con methocarbamol.   Methocarbamol y ibuprofen si necesario por dolor y espasmo en musculos, pero es posible necesario menos ibuprofen con amitriptyline.   va a cuarto de emergencia si empeoran.     Sndrome posconmocional (Post-Concussion Syndrome) El sndrome posconmocional se refiere a los sntomas que pueden ocurrir despus de una lesin en la cabeza. Estos sntomas pueden durar desde varias semanas hasta meses. [place] los medicamentos solamente como se lo haya indicado el mdico. No tome aspirina.  Duerma con la cabeza levantada para [employer] de [place].  Evite las actividades que puedan causarle otra lesin cerebral. ? No practique deportes de contacto, como ftbol o ftbol americano, hockey o bsquet. ? No practique otras actividades riesgosas, como esqu extremo, artes marciales o equitacin, hasta que el mdico se lo permita.  Concurra a todas las visitas de control como se lo haya indicado el mdico. Esto es importante. SOLICITE AYUDA SI:  Tiene dificultades para hacer lo siguiente: ? Prestar atencin. ? Concentrarse. ? Recordar. ? Aprender informacin nueva. ? Scientist, research (medical).  Necesita ms tiempo para terminar las tareas.  Se perturba fcilmente (est irritable).  Tiene ms sntomas. Solicite ayuda si presenta cualquiera de estos sntomas durante ms de [employer] de la lesin:  Dolores de cabeza que perduran (crnicos).  Mareos.  Dificultad para mantener el equilibrio.  Ganas de vomitar (nuseas).  Problemas de visin.  Molestias por los ruidos o las luces.  Depresin.  Cambios en el estado de nimo.  Sensacin de preocupacin (ansiedad).  Poca tolerancia general.  Problemas de memoria.  Dificultad para prestar atencin o concentrarse.  Problemas para dormir.  Cansancio  permanente. SOLICITE AYUDA DE INMEDIATO SI:  Se siente confundido.  Se siente muy somnoliento.  Le cuesta despertarse.  Tiene Higher education careers adviser.  No deja de vomitar.  Tiene la sensacin de que se est moviendo mientras est quieto (vrtigo).  Tiene movimientos oculares muy rpidos de un lado al otro.  Empieza a temblar (convulsiones) o se desvanece (desmayo).  Le duele la cabeza y no mejora con medicamentos.  No puede mover los brazos o las piernas normalmente.  Uno de los centros negros de los ojos (pupilas) est ms grande que el otro.  Presenta una secrecin clara o con sangre que proviene de la nariz o de los odos.  Los sntomas Warehouse manager de Teacher, English as a foreign language. ASEGRESE DE QUE:  Comprende estas instrucciones.  Controlar su afeccin.  Recibir ayuda de inmediato si no mejora o si empeora. Esta informacin no tiene Marine scientist el consejo del mdico. Asegrese de hacerle al mdico cualquier pregunta que tenga. Document Released: 04/04/2005 Document Revised: 04/25/2014 Document Reviewed: 07/10/2013 Elsevier Interactive Patient Education  2018 [employer].  Conmocin cerebral en los adultos Concussion, Adult Una conmocin cerebral es una lesin cerebral a causa de un impacto(golpe) directo en la cabeza o el cuerpo. Este golpe hace que el cerebro se [name] atrs y hacia adelante con rapidez dentro del crneo. En consecuencia, se pueden daar las clulas cerebrales y producir cambios qumicos en el cerebro. Una conmocin cerebral tambin podra conocerse como lesin cerebral traumtica (LCT) leve. Por lo general, las conmociones cerebrales no ponen en riesgo la vida, pero sus consecuencias pueden ser graves. Si ya sufri una conmocin cerebral, es ms probable que experimente sntomas similares a los de [place]  conmocin despus de un golpe directo en la cabeza, en el futuro. Cules son las causas? Las causas de esta afeccin son las siguientes:  Un golpe  directo en la cabeza, como al chocar contra otro jugador en un partido, al recibir un golpe en una lucha o al golpearse la cabeza contra una superficie dura.  Una sacudida de la cabeza o el cuello que hace que el cerebro se mueva hacia adelante y [place] atrs dentro del crneo, como en un choque automovilstico.  [place] son los signos o los sntomas? Los signos de una conmocin cerebral pueden ser difciles de Teacher, adult education. En un primer momento, los pacientes, familiares y profesionales tal vez no los adviertan. Puede ser que aparentemente est normal pero que acte o se sienta diferente. Por lo general, los sntomas son transitorios pero pueden durar [name], semanas o an ms. Algunos sntomas podran aparecer de inmediato; sin embargo, otros quizs no se manifiesten sino hasta dentro de horas o das. Cada lesin en la cabeza es diferente. Entre los sntomas se pueden incluir los siguientes:  Dolores de [place]. Podra incluir una sensacin de presin en la cabeza.  Problemas de memoria.  Dificultad para concentrarse, organizar o tomar decisiones.  Lentitud para pensar, actuar o reaccionar, hablar o leer.  Confusin.  Fatiga.  Cambios en los hbitos de alimentacin o en el sueo.  Problemas de coordinacin o equilibrio.  Nuseas o vmitos.  Adormecimiento u hormigueo.  Sensibilidad a la luz o los ruidos.  Problemas de visin o audicin.  Prdida del sentido del olfato.  Irritabilidad o cambios de humor.  Mareos.  Falta de motivacin.  Ver o escuchar cosas que otras personas no ven ni escuchan (alucinaciones).  Cmo se diagnostica? Esta afeccin se diagnostica en funcin de lo siguiente:  Sus sntomas.  Descripcin de la lesin.  Tambin pueden hacerle exmenes que incluyen:  Pruebas de diagnstico por imgenes, como resonancia magntica (RM) o exploracin por tomografa computarizada (TC). Estos estudios se realizan para [name] signos de lesin  cerebral.  Estudios neuropsicolgicos. Con ellos, se examinan las capacidades de pensar, comprender, aprender y Nurse, learning disability.  Cmo se trata esta afeccin? El tratamiento consiste en el reposo fsico y mental, y en la observacin atenta del nio, habitualmente en Engineer, mining. Si la conmocin cerebral es grave, es posible que Patent attorney en su hogar y no volver al trabajo durante un [place]man. Quizs lo deriven a una clnica especializada en conmociones cerebrales o a otros mdicos especializados en este tipo de tratamientos. Es importante que le avise al mdico si algo de lo siguiente es cierto:  Toma medicamentos, incluidos medicamentos recetados, medicamentos de venta sin receta y remedios naturales. Algunos medicamentos, como los anticoagulantes y la aspirina, podran aumentar la probabilidad de sufrir complicaciones, como hemorragias.  Consume o ha consumido alcohol o drogas. El alcohol y algunas otras sustancias pueden demorar su recuperacin y ponerlo en riesgo de nuevas lesiones.  El tiempo que tarde en recuperarse de una conmocin cerebral depender de muchos factores; por ejemplo, la gravedad de la conmocin cerebral, la zona del cerebro lesionada, su edad y el estado de salud previo a la conmocin cerebral. La recuperacin puede llevar tiempo. Para regresar a las actividades, es importante esperar hasta que el mdico lo autorice y [employer] los sntomas hayan desaparecido por completo. Siga estas indicaciones en su casa: Actividad  Limite las actividades que requieren mucha atencin o Estate manager/land agent. Estas pueden incluir lo siguiente: ? Tareas para el hogar o trabajos relacionados con el  empleo. ? Mirar televisin. ? Trabajar en la computadora. ? Jugar juegos de memoria y armar rompecabezas.  Reposo. El descanso favorece la curacin del cerebro. Haga lo siguiente: ? Duerma bien por la noche. Evite quedarse despierto hasta muy tarde por la noche. ? Durmase a la [employer]. ? Becker. Tome siestas o haga pausas cuando se sienta cansado.  Puede ser peligroso que el nio sufra otra conmocin cerebral antes de que se haya recuperado de la primera. No realice actividades de alto riesgo que pudieran provocar una segunda conmocin cerebral, como andar en bicicleta o practicar deportes.  Pregntele al mdico cundo podr reanudar sus actividades habituales, como ir a la escuela, trabajar, Psychologist, prison and probation services deportes, conducir, andar en bicicleta o utilizar maquinaria pesada. La capacidad para reaccionar puede ser ms lenta luego de una lesin cerebral. Nunca realice estas actividades si se siente mareado. Posiblemente, el mdico le d un plan para retomar las actividades de forma gradual. Instrucciones generales  [employer] medicamentos de venta libre y los recetados solamente como se lo haya indicado el mdico.  No beba alcohol hasta que el mdico se lo autorice.  Si le resulta ms difcil que lo habitual recordar las cosas, escrbalas.  Trate de hacer una cosa por vez si se distrae con facilidad. Por ejemplo, no mire televisin mientras prepara la cena.  Consulte con familiares y amigos si debe tomar decisiones importantes.  Controle sus sntomas y pdale a otras personas que tambin lo hagan. En algunos casos pueden aparecer complicaciones luego de una conmocin cerebral. Los adultos mayores con lesiones cerebrales podran tener un mayor riesgo de sufrir complicaciones graves, como un cogulo de Biochemist, clinical en el cerebro.  Informe a los [employer], el departamento de enfermera de la escuela, el consejero escolar, Therapist, nutritional acerca de su lesin, los sntomas y las restricciones que tiene. Hgales saber lo que puede y lo que no Fish farm manager. Ellos debern observar: ? Aumento en los problemas de atencin o Estate manager/land agent. ? Ms dificultades para recordar o aprender informacin nueva. ? Aumento del tiempo que necesita para completar tareas o  encargos. ? Aumento de la irritabilidad o disminucin de la capacidad para Animal nutritionist. ? Aparicin de nuevos sntomas.  Concurra a todas las visitas de control como se lo haya indicado el mdico. Esto es importante. Cmo se previene? Es muy importante que evite otra lesin cerebral; en especial, mientras se recupera. En casos raros, una nueva lesin puede causar daos cerebrales permanentes, hinchazn del cerebro o la muerte. El riesgo es mayor durante los primeros 7 a 10 das despus de una lesin en la cabeza. Evite las lesiones:  Use el cinturn de seguridad al conducir un automvil.  Use un casco cuando ande en bicicleta, esque, patine o realice actividades similares.  Evite actividades que podran causar una segunda conmocin cerebral, como deportes de contacto o recreativos hasta que su mdico lo autorice.  Tome medidas de seguridad en el hogar, por ejemplo: ? No tenga desorden en pisos y escaleras. ? Coloque barras para sostn en los baos y [employer] en las escaleras. ? Ponga alfombras antideslizantes en pisos y baeras. ? Mejore la iluminacin en zonas de penumbra.  Comunquese con un mdico si:  Los sntomas empeoran.  Aparecen nuevos sntomas.  Contina teniendo sntomas durante ms de 2semanas. Solicite ayuda de inmediato si:  Los dolores de [place] son intensos o TEFL teacher.  Siente debilidad o adormecimiento en alguna parte del cuerpo.  Su coordinacin  empeora.  Vomita repetidas veces.  Se siente ms somnoliento.  La pupila de un ojo est ms grande que la del otro ojo.  Sufre convulsiones o espasmos.  Arrastra las palabras.  La fatiga, la confusin o la irritabilidad empeoran.  No puede Recruitment consultant o lugares.  Siente dolor en el cuello.  Le resulta difcil despertarse.  Tiene cambios de conducta inusuales.  Pierde la conciencia. Resumen  Una conmocin cerebral es una lesin cerebral a causa de un impacto(golpe) directo en la  cabeza o el cuerpo.  Una conmocin cerebral tambin podra llamarse lesin cerebral traumtica (LCT) leve.  Podran realizarle estudios de diagnstico por imgenes y estudios neuropsicolgicos para diagnosticar la conmocin cerebral.  El tratamiento consiste en el reposo fsico y mental, y en la observacin atenta.  Pregntele al mdico cundo podr reanudar sus actividades habituales, como ir a la escuela, trabajar, Psychologist, prison and probation services deportes, conducir, andar en bicicleta o utilizar maquinaria pesada. Siga las instrucciones de seguridad que le haya indicado el mdico. Esta informacin no tiene Marine scientist el consejo del mdico. Asegrese de hacerle al mdico cualquier pregunta que tenga. Document Released: 03/17/2008 Document Revised: 07/06/2016 Document Reviewed: 10/25/2012 Elsevier Interactive Patient Education  2018 [place] decorrentes de colises de veculos automotores Furniture conservator/restorer Injury) Leses no rosto, nos braos e no corpo so comuns aps colises de veculos automotores. Essas leses podem incluir cortes, queimaduras, hematomas e dores musculares. Elas tendem a causar sensaes piores nas primeiras 24-48 horas. Voc pode sentir a maior [place] dor nas primeiras horas. Voc tambm pode se sentir pior ao acordar na primeira manh aps a coliso. Nos dias seguintes, voc em geral comear a se sentir melhor a cada dia. A velocidade da sua melhora depender da gravidade da [place], do nmero de leses recebidas, da localizao e da natureza dessas leses e de o seu air bag ter sido ativado ou no. [place] e aplique medicamentos de venda livre e vendidos com receita mdica somente de acordo com as orientaes do seu mdico.  Caso tenha recebido prescrio de antibitico, tome-o ou aplique-o como determinado pelo seu mdico. No pare de usar o antibitico mesmo se seu Oceanographer. Caso tenha  uma ferida ou queimadura:  Limpe a ferida ou queimadura como determinado pelo seu mdico. ? Stacy Gardner a ferida ou [place] com gua e sabo suave. ? Enxgue a [place] com gua para remover todo o sabo. ? Enxugue a [place] com uma toalha limpa. No esfregue.  Siga as instrues do seu mdico sobre como cuidar da sua ferida ou [place]. Certifique-se de: ? Saber quando e como trocar suas bandagens (curativo). Sempre lavar as mos com gua e sabo antes de trocar seu curativo. Caso gua e sabo no estejam disponveis, use gel antissptico para as mos. ? No remova pontos (suturas), cola cirrgica ou faixas adesivas, se for o caso. Pode ser necessrio deixar esses itens no lugar por 2 semanas ou mais. Caso as [employer] comecem a se soltar ou dobrar, voc pode cortar as Museum/gallery conservator. No remova faixas adesivas completamente a menos que seu mdico lhe diga para fazer isso. ? Descubra quando voc deve remover suas bandagens.  No coce nem mexa na ferida ou [place].  No rompa quaisquer bolhas que houver. No remova a pele.  Evite expor a [place] ou ferida ao sol.  Erga (eleve) a ferida ou queimadura acima do nvel do corao  quando sentado ou deitado. Caso tenha uma ferida ou queimadura no rosto, voc poder preferir dormir com a Programmer, applications. Voc pode fazer isso colocando um travesseiro extra sob a cabea.  Verifique a ferida ou [place] todos os dias em busca de sinais de infeco. Fique atento para: ? Vermelhido, inchao e dor. ? Lquido, sangue e pus. ? [place]. ? Cheiro ruim. Instrues gerais  Aplique gelo aos olhos, rosto, tronco ou outras reas lesionadas de acordo com as indicaes do seu mdico. Isso pode ajudar a Public house manager a dor e o inchao. ? Coloque gelo em uma sacola plstica. ? Coloque uma toalha entre a pele e a sacola. ? Deixe o gelo por 20 minutos, 2-3 vezes ao dia.  Beba lquidos em quantidade  suficiente para manter sua urina clara ou na cor amarela plida.  No beba lcool.  Pergunte ao seu mdico se h alguma restrio quanto a voc [employer]. Erguer pesos pode piorar a sensao no pescoo e nas costas, se for [employer].  Repouse. O repouso ajuda o seu corpo a se recuperar. Certifique-se de: ? Dormir bastante  noite. Evite ficar acordado at tarde. ? Ir dormir e acordar nos mesmos horrios nos fins de semana e nos dias de semana.  Pergunte ao seu mdico quando voc poder dirigir, andar de bicicleta ou operar mquinas pesadas. Sua capacidade de reagir rapidamente poder estar reduzida caso voc tenha lesionado a cabea. No realize essas atividades se estiver sentindo tontura. PROCURE UM MDICO SE:  Seus sintomas piorarem.  Tiver qualquer [employer] sintomas a seguir por mais de duas semanas aps sua coliso automotora: ? Dores de cabeas duradouras (crnicas). ? Tontura ou problemas de equilbrio. ? Enjoo. ? Problemas na viso. ? Maior sensibilidade a rudos ou  luz. ? Depresso ou flutuaes do humor. ? Ansiedade ou irritabilidade. ? Problemas de memria. ? Dificuldade para se concentrar ou prestar ateno. ? Problemas do sono. ? Cansao constante. PROCURE UM MDICO IMEDIATAMENTE SE:  Voc apresentar: ? Dormncia, formigamento ou fraqueza nos braos ou pernas. ? Dor intensa no pescoo, especialmente sensibilidade na parte central da nuca. ? Alteraes do controle dos intestinos ou da bexiga. ? Dor crescente em qualquer parte do corpo. ? Falta de ar ou vertigem. ? Dor no peito. ? Sangue na urina, nas fezes ou no vmito. ? Dor intensa no abdome ou nas costas. ? Dores de cabea intensas ou cada vez piores. ? Perda sbita da viso ou viso dupla.  O olho ficar vermelho de repente.  Sua pupila ficar com um formato ou tamanho estranho. Estas informaes no se destinam a substituir as recomendaes de seu mdico. No deixe de discutir quaisquer dvidas com seu  mdico. Document Released: 01/29/2009 Document Revised: 07/27/2015 Document Reviewed: 10/17/2014 Elsevier Interactive Patient Education  [employer].    If you have lab work done today you will be contacted with your lab results within the next 2 weeks.  If you have not heard from [place] then please contact us. The fastest way to get your results is to register for My Chart.   IF you received an x-ray today, you will receive an invoice from [facility]. Please contact [facility] at [phone] with questions or concerns regarding your invoice.   IF you received labwork today, you will receive an invoice from [facility]. Please contact [facility] at [phone] with questions or concerns regarding your invoice.   Our billing staff will not be able to assist you with questions regarding bills from these  companies.  You will be contacted with the lab results as soon as they are available. The fastest way to get your results is to activate your My Chart account. Instructions are located on the last page of this paperwork. If you have not heard from [place] regarding the results in 2 weeks, please contact this office.

## 2018-02-07 NOTE — Progress Notes (Addendum)
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 for [employer].  Repouse. O repouso ajuda o seu  corpo a se recuperar. Certifique-se de: ? Dormir bastante  noite. Evite ficar acordado at tarde. ? Ir dormir e acordar nos mesmos horrios nos fins de semana e nos dias de semana.  Pergunte ao seu mdico quando voc poder dirigir, andar de bicicleta ou operar mquinas pesadas. Sua capacidade de reagir rapidamente poder estar reduzida caso voc tenha lesionado a cabea. No realize essas atividades se estiver sentindo tontura. PROCURE UM MDICO SE:  Seus sintomas piorarem.  Tiver qualquer [employer] sintomas a  seguir por mais de duas semanas aps sua coliso automotora: ? Dores de cabeas duradouras (crnicas). ? Tontura ou problemas de equilbrio. ? Enjoo. ? Problemas na viso. ? Maior sensibilidade a rudos ou  luz. ? Depresso ou flutuaes do humor. ? Ansiedade ou irritabilidade. ? Problemas de memria. ? Dificuldade para se concentrar ou prestar ateno. ? Problemas do sono. ? Cansao constante. PROCURE UM MDICO IMEDIATAMENTE SE:  Voc apresentar: ? Dormncia, formigamento ou fraqueza nos braos ou pernas. ? Dor intensa no pescoo, especialmente sensibilidade na parte central da nuca. ? Alteraes do controle dos intestinos ou da bexiga. ? Dor crescente em qualquer parte do corpo. ? Falta de ar ou vertigem. ? Dor no peito. ? Sangue na urina, nas fezes ou no vmito. ? Dor intensa no abdome ou nas costas. ? Dores de cabea intensas ou cada vez piores. ? Perda sbita da viso ou viso dupla.  O olho ficar vermelho de repente.  Sua pupila ficar com um formato ou tamanho estranho. Estas informaes no se destinam a substituir as recomendaes de seu mdico. No deixe de discutir quaisquer dvidas com seu mdico. Document Released: 01/29/2009 Document Revised: 07/27/2015 Document Reviewed: 10/17/2014 Elsevier Interactive Patient Education  Kim Schein.    If you have lab work done today you will be contacted with your lab results within the next 2 weeks.  If you have not heard from [place] then please contact us. The fastest way to get your results is to register for My Chart.   IF you received an x-[name] today, you will receive an invoice from [facility]. Please contact [facility] at [phone] with questions or concerns regarding your invoice.   IF you received labwork today, you will receive an invoice from [facility]. Please contact [facility] at [phone] with questions or concerns regarding your invoice.   Our billing staff will not be able to assist  you with questions regarding bills from these companies.  You will be contacted with the lab results as soon as they are available. The fastest way to get your results is to activate your My Chart account. Instructions are located on the last page of this paperwork. If you have not heard from [place] regarding the results in 2 weeks, please contact this office.      I personally performed the services described in this documentation, which was scribed in my presence. The recorded information has been reviewed and considered for accuracy and completeness, addended by me as needed, and agree with information above.  Signed,   Kim Ray, MD [facility] [place].  02/10/18 9:55 AM

## 2018-02-11 ENCOUNTER — Encounter: Payer: Self-pay | Admitting: Family Medicine

## 2018-02-13 ENCOUNTER — Other Ambulatory Visit: Payer: Self-pay

## 2018-02-13 ENCOUNTER — Encounter: Payer: Self-pay | Admitting: Family Medicine

## 2018-02-13 ENCOUNTER — Ambulatory Visit: Payer: BLUE CROSS/BLUE SHIELD | Admitting: Family Medicine

## 2018-02-13 VITALS — BP 131/83 | HR 68 | Temp 98.4°F | Ht 65.0 in | Wt 185.6 lb

## 2018-02-13 DIAGNOSIS — F0781 Postconcussional syndrome: Secondary | ICD-10-CM

## 2018-02-13 DIAGNOSIS — G44309 Post-traumatic headache, unspecified, not intractable: Secondary | ICD-10-CM

## 2018-02-13 DIAGNOSIS — M791 Myalgia, unspecified site: Secondary | ICD-10-CM
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
## 2018-02-13 NOTE — Patient Instructions (Addendum)
Descanse es importante. obtenecer ayuda por ninos si necesario.   Ibuprofen si necesario.  Continua elavil cada dia. Es importante que ud va a specialista de cerebro. oficina de specialista voy llamale. si simptomas empeorse, va a cuarto de emergencia.    Sndrome posconmocional (Post-Concussion Syndrome) El sndrome posconmocional se refiere a los sntomas que pueden ocurrir despus de una lesin en la cabeza. Estos sntomas pueden durar desde varias semanas hasta meses. [place] los medicamentos solamente como se lo haya indicado el mdico. No tome aspirina.  Duerma con la cabeza levantada para [employer] de [place].  Evite las actividades que puedan causarle otra lesin cerebral. ? No practique deportes de contacto, como ftbol o ftbol americano, hockey o bsquet. ? No practique otras actividades riesgosas, como esqu extremo, artes marciales o equitacin, hasta que el mdico se lo permita.  Concurra a todas las visitas de control como se lo haya indicado el mdico. Esto es importante. SOLICITE AYUDA SI:  Tiene dificultades para hacer lo siguiente: ? Prestar atencin. ? Concentrarse. ? Recordar. ? Aprender informacin nueva. ? Scientist, research (medical).  Necesita ms tiempo para terminar las tareas.  Se perturba fcilmente (est irritable).  Tiene ms sntomas. Solicite ayuda si presenta cualquiera de estos sntomas durante ms de [employer] de la lesin:  Dolores de cabeza que perduran (crnicos).  Mareos.  Dificultad para mantener el equilibrio.  Ganas de vomitar (nuseas).  Problemas de visin.  Molestias por los ruidos o las luces.  Depresin.  Cambios en el estado de nimo.  Sensacin de preocupacin (ansiedad).  Poca tolerancia general.  Problemas de memoria.  Dificultad para prestar atencin o concentrarse.  Problemas para dormir.  Cansancio permanente. SOLICITE AYUDA DE INMEDIATO SI:  Se siente  confundido.  Se siente muy somnoliento.  Le cuesta despertarse.  Tiene Higher education careers adviser.  No deja de vomitar.  Tiene la sensacin de que se est moviendo mientras est quieto (vrtigo).  Tiene movimientos oculares muy rpidos de un lado al otro.  Empieza a temblar (convulsiones) o se desvanece (desmayo).  Le duele la cabeza y no mejora con medicamentos.  No puede mover los brazos o las piernas normalmente.  Uno de los centros negros de los ojos (pupilas) est ms grande que el otro.  Presenta una secrecin clara o con sangre que proviene de la nariz o de los odos.  Los sntomas Warehouse manager de Teacher, English as a foreign language. ASEGRESE DE QUE:  Comprende estas instrucciones.  Controlar su afeccin.  Recibir ayuda de inmediato si no mejora o si empeora. Esta informacin no tiene Marine scientist el consejo del mdico. Asegrese de hacerle al mdico cualquier pregunta que tenga. Document Released: 04/04/2005 Document Revised: 04/25/2014 Document Reviewed: 07/10/2013 Elsevier Interactive Patient Education  2018 [place] decorrentes de colises de veculos automotores Furniture conservator/restorer Injury) Leses no rosto, nos braos e no corpo so comuns aps colises de veculos automotores. Essas leses podem incluir cortes, queimaduras, hematomas e dores musculares. Elas tendem a causar sensaes piores nas primeiras 24-48 horas. Voc pode sentir a maior [place] dor nas primeiras horas. Voc tambm pode se sentir pior ao acordar na primeira manh aps a coliso. Nos dias seguintes, voc em geral comear a se sentir melhor a cada dia. A velocidade da sua melhora depender da gravidade da [place], do nmero de leses recebidas, da localizao e da natureza dessas leses e de o seu air bag ter sido ativado ou no.  [place] e aplique medicamentos de venda livre e vendidos com receita mdica somente de acordo com as  orientaes do seu mdico.  Caso tenha recebido prescrio de antibitico, tome-o ou aplique-o como determinado pelo seu mdico. No pare de usar o antibitico mesmo se seu Oceanographer. Caso tenha uma ferida ou queimadura:  Limpe a ferida ou queimadura como determinado pelo seu mdico. ? Stacy Gardner a ferida ou [place] com gua e sabo suave. ? Enxgue a [place] com gua para remover todo o sabo. ? Enxugue a [place] com uma toalha limpa. No esfregue.  Siga as instrues do seu mdico sobre como cuidar da sua ferida ou [place]. Certifique-se de: ? Saber quando e como trocar suas bandagens (curativo). Sempre lavar as mos com gua e sabo antes de trocar seu curativo. Caso gua e sabo no estejam disponveis, use gel antissptico para as mos. ? No remova pontos (suturas), cola cirrgica ou faixas adesivas, se for o caso. Pode ser necessrio deixar esses itens no lugar por 2 semanas ou mais. Caso as [employer] comecem a se soltar ou dobrar, voc pode cortar as Museum/gallery conservator. No remova faixas adesivas completamente a menos que seu mdico lhe diga para fazer isso. ? Descubra quando voc deve remover suas bandagens.  No coce nem mexa na ferida ou [place].  No rompa quaisquer bolhas que houver. No remova a pele.  Evite expor a [place] ou ferida ao sol.  Erga (eleve) a ferida ou [place] acima do nvel do corao quando sentado ou deitado. Caso tenha uma ferida ou queimadura no rosto, voc poder preferir dormir com a Programmer, applications. Voc pode fazer isso colocando um travesseiro extra sob a cabea.  Verifique a ferida ou [place] todos os dias em busca de sinais de infeco. Fique atento para: ? Vermelhido, inchao e dor. ? Lquido, sangue e pus. ? [place]. ? Cheiro ruim. Instrues gerais  Aplique gelo aos olhos, rosto, tronco ou outras reas lesionadas de acordo com as indicaes do seu mdico. Isso pode ajudar  a Public house manager a dor e o inchao. ? Coloque gelo em uma sacola plstica. ? Coloque uma toalha entre a pele e a sacola. ? Deixe o gelo por 20 minutos, 2-3 vezes ao dia.  Beba lquidos em quantidade suficiente para manter sua urina clara ou na cor amarela plida.  No beba lcool.  Pergunte ao seu mdico se h alguma restrio quanto a voc [employer]. Erguer pesos pode piorar a sensao no pescoo e nas costas, se for [employer].  Repouse. O repouso ajuda o seu corpo a se recuperar. Certifique-se de: ? Dormir bastante  noite. Evite ficar acordado at tarde. ? Ir dormir e acordar nos mesmos horrios nos fins de semana e nos dias de semana.  Pergunte ao seu mdico quando voc poder dirigir, andar de bicicleta ou operar mquinas pesadas. Sua capacidade de reagir rapidamente poder estar reduzida caso voc tenha lesionado a cabea. No realize essas atividades se estiver sentindo tontura. PROCURE UM MDICO SE:  Seus sintomas piorarem.  Tiver qualquer [employer] sintomas a seguir por mais de duas semanas aps sua coliso automotora: ? Dores de cabeas duradouras (crnicas). ? Tontura ou problemas de equilbrio. ? [place]. ? Problemas na viso. ? Maior sensibilidade a rudos ou  luz. ? Depresso ou flutuaes do humor. ? [place]ade ou irritabilidade. ? Problemas de memria. ? Dificuldade para se concentrar ou prestar ateno. ? Problemas do sono. ? Cansao  constante. PROCURE UM MDICO IMEDIATAMENTE SE:  Voc apresentar: ? Dormncia, formigamento ou fraqueza nos braos ou pernas. ? Dor intensa no pescoo, especialmente sensibilidade na parte central da nuca. ? Alteraes do controle dos intestinos ou da bexiga. ? Dor crescente em qualquer parte do corpo. ? Falta de ar ou vertigem. ? Dor no peito. ? Sangue na urina, nas fezes ou no vmito. ? Dor intensa no abdome ou nas costas. ? Dores de cabea intensas ou cada vez piores. ? Perda sbita da viso ou viso dupla.  O olho ficar vermelho de  repente.  Sua pupila ficar com um formato ou tamanho estranho. Estas informaes no se destinam a substituir as recomendaes de seu mdico. No deixe de discutir quaisquer dvidas com seu mdico. Document Released: 01/29/2009 Document Revised: 07/27/2015 Document Reviewed: 10/17/2014 Elsevier Interactive Patient Education  [employer].    If you have lab work done today you will be contacted with your lab results within the next 2 weeks.  If you have not heard from [place] then please contact us. The fastest way to get your results is to register for My Chart.   IF you received an x-ray today, you will receive an invoice from [facility]. Please contact [facility] at [phone] with questions or concerns regarding your invoice.   IF you received labwork today, you will receive an invoice from [place]. Please contact [facility] at [phone] with questions or concerns regarding your invoice.   Our billing staff will not be able to assist you with questions regarding bills from these companies.  You will be contacted with the lab results as soon as they are available. The fastest way to get your results is to activate your My Chart account. Instructions are located on the last page of this paperwork. If you have not heard from [place] regarding the results in 2 weeks, please contact this office.

## 2018-02-15 ENCOUNTER — Encounter: Payer: Self-pay | Admitting: Family Medicine

## 2018-02-20 ENCOUNTER — Ambulatory Visit: Payer: BLUE CROSS/BLUE SHIELD | Admitting: Family Medicine

## 2018-02-21 ENCOUNTER — Encounter (HOSPITAL_COMMUNITY): Payer: Self-pay | Admitting: *Deleted

## 2018-02-21 ENCOUNTER — Emergency Department (HOSPITAL_COMMUNITY)
Admission: EM | Admit: 2018-02-21 | Discharge: 2018-02-21 | Disposition: A | Discharge status: Home / Self Care | Payer: BLUE CROSS/BLUE SHIELD | Attending: Emergency Medicine | Admitting: Emergency Medicine

## 2018-02-21 DIAGNOSIS — J45909 Unspecified asthma, uncomplicated: Secondary | ICD-10-CM | POA: Insufficient documentation

## 2018-02-21 DIAGNOSIS — Z041 Encounter for examination and observation following transport accident: Secondary | ICD-10-CM | POA: Insufficient documentation

## 2018-02-21 DIAGNOSIS — Z79899 Other long term (current) drug therapy: Secondary | ICD-10-CM | POA: Insufficient documentation

## 2018-02-21 MED ORDER — IBUPROFEN 600 MG PO TABS: 600.0000 mg | ORAL_TABLET | Freq: Three times a day (TID) | ORAL | 0 refills | Status: AC

## 2018-02-21 MED ORDER — TRAMADOL HCL 50 MG PO TABS: 50.0000 mg | ORAL_TABLET | Freq: Four times a day (QID) | ORAL | 0 refills | Status: DC | PRN
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
## 2018-02-21 NOTE — Discharge Instructions (Signed)
As discussed, your evaluation today has been largely reassuring.  But, it is important that you monitor your condition carefully, and do not hesitate to return to the ED if you develop new, or concerning changes in your condition. ? ?Otherwise, please follow-up with your physician for appropriate ongoing care. ? ?

## 2018-02-28 ENCOUNTER — Encounter: Payer: BLUE CROSS/BLUE SHIELD | Admitting: Obstetrics & Gynecology

## 2018-03-01 ENCOUNTER — Encounter: Payer: Self-pay | Admitting: Neurology

## 2018-03-07 ENCOUNTER — Emergency Department (HOSPITAL_COMMUNITY): Payer: Self-pay

## 2018-03-07 ENCOUNTER — Ambulatory Visit (INDEPENDENT_AMBULATORY_CARE_PROVIDER_SITE_OTHER): Payer: Self-pay | Admitting: Family Medicine

## 2018-03-07 ENCOUNTER — Encounter: Payer: Self-pay | Admitting: Family Medicine

## 2018-03-07 ENCOUNTER — Emergency Department (HOSPITAL_COMMUNITY)
Admission: EM | Admit: 2018-03-07 | Discharge: 2018-03-08 | Disposition: A | Discharge status: Home / Self Care | Payer: Self-pay | Attending: Emergency Medicine | Admitting: Emergency Medicine

## 2018-03-07 ENCOUNTER — Encounter (HOSPITAL_COMMUNITY): Payer: Self-pay

## 2018-03-07 ENCOUNTER — Other Ambulatory Visit: Payer: Self-pay

## 2018-03-07 VITALS — BP 120/76 | HR 71 | Temp 98.0°F | Ht 65.0 in | Wt 187.6 lb

## 2018-03-07 DIAGNOSIS — F0781 Postconcussional syndrome: Secondary | ICD-10-CM

## 2018-03-07 DIAGNOSIS — G44309 Post-traumatic headache, unspecified, not intractable: Secondary | ICD-10-CM

## 2018-03-07 DIAGNOSIS — R200 Anesthesia of skin: Secondary | ICD-10-CM

## 2018-03-07 DIAGNOSIS — Z79899 Other long term (current) drug therapy: Secondary | ICD-10-CM | POA: Insufficient documentation

## 2018-03-07 DIAGNOSIS — J45909 Unspecified asthma, uncomplicated: Secondary | ICD-10-CM | POA: Insufficient documentation

## 2018-03-07 DIAGNOSIS — R233 Spontaneous ecchymoses: Secondary | ICD-10-CM

## 2018-03-07 DIAGNOSIS — R51 Headache: Secondary | ICD-10-CM | POA: Insufficient documentation

## 2018-03-07 DIAGNOSIS — R238 Other skin changes: Secondary | ICD-10-CM

## 2018-03-07 DIAGNOSIS — R203 Hyperesthesia: Secondary | ICD-10-CM | POA: Insufficient documentation

## 2018-03-07 DIAGNOSIS — R42 Dizziness and giddiness: Secondary | ICD-10-CM

## 2018-03-07 LAB — GLUCOSE, POCT (MANUAL RESULT ENTRY): POC Glucose: 75 mg/dl (ref 70–99)

## 2018-03-07 LAB — POCT CBC
Granulocyte percent: 58.5 %G (ref 37–80)
HCT, POC: 39.3 % (ref 29–41)
Hemoglobin: 13.3 g/dL (ref 9.5–13.5)
Lymph, poc: 3 (ref 0.6–3.4)
MCH, POC: 30.5 pg (ref 27–31.2)
MCHC: 33.8 g/dL (ref 31.8–35.4)
MCV: 90.3 fL (ref 76–111)
MID (cbc): 0.4 (ref 0–0.9)
MPV: 7.1 fL (ref 0–99.8)
POC Granulocyte: 4.9 (ref 2–6.9)
POC LYMPH PERCENT: 36.2 %L (ref 10–50)
POC MID %: 5.3 %M (ref 0–12)
Platelet Count, POC: 264 10*3/uL (ref 142–424)
RBC: 4.35 M/uL (ref 4.04–5.48)
RDW, POC: 12.9 %
WBC: 8.4 10*3/uL (ref 4.6–10.2)

## 2018-03-07 MED ORDER — DIPHENHYDRAMINE HCL 50 MG/ML IJ SOLN
25.0000 mg | Freq: Once | INTRAMUSCULAR | Status: AC
  Administered 2018-03-08: 25 mg via INTRAVENOUS
  Filled 2018-03-07: qty 1

## 2018-03-07 MED ORDER — SODIUM CHLORIDE 0.9 % IV BOLUS
1000.0000 mL | Freq: Once | INTRAVENOUS | Status: AC
  Administered 2018-03-08: 1000 mL via INTRAVENOUS

## 2018-03-07 MED ORDER — DEXAMETHASONE SODIUM PHOSPHATE 10 MG/ML IJ SOLN
10.0000 mg | Freq: Once | INTRAMUSCULAR | Status: AC
  Administered 2018-03-08: 10 mg via INTRAVENOUS
  Filled 2018-03-07: qty 1

## 2018-03-07 MED ORDER — METOCLOPRAMIDE HCL 5 MG/ML IJ SOLN
10.0000 mg | Freq: Once | INTRAMUSCULAR | Status: AC
  Administered 2018-03-08: 10 mg via INTRAVENOUS
  Filled 2018-03-07: qty 2

## 2018-03-07 NOTE — Patient Instructions (Addendum)
Ve a la sala de Surveyor, minerals porque su dolor de cabeza esta empeorando.   If you have lab work done today you will be contacted with your lab results within the next 2 weeks.  If you have not heard from [place] then please contact us. The fastest way to get your results is to register for My Chart.    IF you received an x-ray today, you will receive an invoice from [facility] Rad[facility]. Please contact [facility] Radiology at [phone] with questions or concerns regarding your invoice.   IF you received labwork today, you will receive an invoice from [place]. Please contact [facility] at [phone] with questions or concerns regarding your invoice.   Our billing staff will not be able to assist you with questions regarding bills from these companies.  You will be contacted with the lab results as soon as they are available. The fastest way to get your results is to activate your My Chart account. Instructions are located on the last page of this paperwork. If you have not heard from [place] regarding the results in 2 weeks, please contact this office.
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
## 2018-03-08 ENCOUNTER — Emergency Department (HOSPITAL_COMMUNITY): Payer: Self-pay

## 2018-03-08 LAB — I-STAT CHEM 8, ED
BUN: 11 mg/dL (ref 6–20)
Calcium, Ion: 1.1 mmol/L — ABNORMAL LOW (ref 1.15–1.40)
Chloride: 105 mmol/L (ref 98–111)
Creatinine, Ser: 0.4 mg/dL — ABNORMAL LOW (ref 0.44–1.00)
Glucose, Bld: 95 mg/dL (ref 70–99)
HCT: 42 % (ref 36.0–46.0)
Hemoglobin: 14.3 g/dL (ref 12.0–15.0)
Potassium: 3.5 mmol/L (ref 3.5–5.1)
Sodium: 138 mmol/L (ref 135–145)
TCO2: 24 mmol/L (ref 22–32)

## 2018-03-08 LAB — I-STAT BETA HCG BLOOD, ED (MC, WL, AP ONLY): I-stat hCG, quantitative: <5 m[IU]/mL (ref <5)

## 2018-03-08 MED ORDER — CYCLOBENZAPRINE HCL 10 MG PO TABS: 10.0000 mg | ORAL_TABLET | Freq: Two times a day (BID) | ORAL | 0 refills | Status: DC | PRN

## 2018-03-08 MED ORDER — ALUM & MAG HYDROXIDE-SIMETH 200-200-20 MG/5ML PO SUSP
30.0000 mL | Freq: Once | ORAL | Status: AC
  Administered 2018-03-08: 30 mL via ORAL
  Filled 2018-03-08: qty 30

## 2018-03-08 MED ORDER — KETOROLAC TROMETHAMINE 15 MG/ML IJ SOLN
15.0000 mg | Freq: Once | INTRAMUSCULAR | Status: AC
  Administered 2018-03-08: 15 mg via INTRAVENOUS
  Filled 2018-03-08: qty 1

## 2018-03-08 MED ORDER — MORPHINE SULFATE (PF) 4 MG/ML IV SOLN
4.0000 mg | Freq: Once | INTRAVENOUS | Status: AC
  Administered 2018-03-08: 4 mg via INTRAVENOUS
  Filled 2018-03-08: qty 1

## 2018-03-08 MED ORDER — GABAPENTIN 100 MG PO CAPS: 100.0000 mg | ORAL_CAPSULE | Freq: Three times a day (TID) | ORAL | 0 refills | Status: DC

## 2018-03-08 NOTE — Discharge Instructions (Addendum)
I discussed your case with Neurology today, and will start a low-dose medication to help with nerve-related pain.  This medication can take up to a week to take effect and should be increased slowly.  He should follow-up with your primary doctor in the next week to discuss further medication changes.

## 2018-03-08 NOTE — ED Notes (Signed)
paged SANE RN/DAWN

## 2018-03-09 ENCOUNTER — Emergency Department (HOSPITAL_COMMUNITY): Admission: EM | Admit: 2018-03-09 | Discharge: 2018-03-09 | Discharge status: Absent without leave | Payer: Self-pay

## 2018-03-09 NOTE — ED Notes (Signed)
Pt registered incorrectly, did not need to be seen my md, only wants referral from last visit.

## 2018-03-12 ENCOUNTER — Other Ambulatory Visit: Payer: Self-pay

## 2018-03-12 ENCOUNTER — Ambulatory Visit (INDEPENDENT_AMBULATORY_CARE_PROVIDER_SITE_OTHER): Payer: Self-pay | Admitting: Family Medicine

## 2018-03-12 ENCOUNTER — Encounter: Payer: Self-pay | Admitting: Family Medicine

## 2018-03-12 VITALS — BP 124/78 | HR 83 | Temp 98.4°F | Ht 65.0 in | Wt 186.6 lb

## 2018-03-12 DIAGNOSIS — G44309 Post-traumatic headache, unspecified, not intractable: Secondary | ICD-10-CM

## 2018-03-12 DIAGNOSIS — R42 Dizziness and giddiness: Secondary | ICD-10-CM

## 2018-03-12 DIAGNOSIS — R002 Palpitations: Secondary | ICD-10-CM

## 2018-03-12 DIAGNOSIS — F0781 Postconcussional syndrome: Secondary | ICD-10-CM

## 2018-03-12 DIAGNOSIS — F418 Other specified anxiety disorders: Secondary | ICD-10-CM
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
## 2018-03-12 NOTE — Patient Instructions (Addendum)
Continua mismo medicina ahora, pero si tiene nuevos effectose secondarios de nueva medicine, regresa.   voy a refirle Education officer, museum doctor por simptomas de concuso, y posible mas temprano cita con neurologico.   Margarette Asal:  Royetta Crochet, [phone]   If you have lab work done today you will be contacted with your lab results within the next 2 weeks.  If you have not heard from [place] then please contact us. The fastest way to get your results is to register for My Chart.   IF you received an x-ray today, you will receive an invoice from [facility]. Please contact [facility] at [phone] with questions or concerns regarding your invoice.   IF you received labwork today, you will receive an invoice from [facility]. Please contact [facility] at [phone] with questions or concerns regarding your invoice.   Our billing staff will not be able to assist you with questions regarding bills from these companies.  You will be contacted with the lab results as soon as they are available. The fastest way to get your results is to activate your My Chart account. Instructions are located on the last page of this paperwork. If you have not heard from [place] regarding the results in 2 weeks, please contact this office.

## 2018-03-13 ENCOUNTER — Encounter: Payer: Self-pay | Admitting: Family Medicine

## 2018-03-13 ENCOUNTER — Telehealth: Payer: Self-pay | Admitting: Family Medicine

## 2018-03-13 LAB — COMPREHENSIVE METABOLIC PANEL
ALT: 10 IU/L (ref 0–32)
AST: 12 IU/L (ref 0–40)
Albumin/Globulin Ratio: 1.4 (ref 1.2–2.2)
Albumin: 4.2 g/dL (ref 3.5–5.5)
Alkaline Phosphatase: 96 IU/L (ref 39–117)
BUN/Creatinine Ratio: 24 — ABNORMAL HIGH (ref 9–23)
BUN: 12 mg/dL (ref 6–24)
Bilirubin Total: 0.3 mg/dL (ref 0.0–1.2)
CO2: 21 mmol/L (ref 20–29)
Calcium: 9.1 mg/dL (ref 8.7–10.2)
Chloride: 102 mmol/L (ref 96–106)
Creatinine, Ser: 0.49 mg/dL — ABNORMAL LOW (ref 0.57–1.00)
GFR calc Af Amer: 134 mL/min/{1.73_m2} (ref >59)
GFR calc non Af Amer: 116 mL/min/{1.73_m2} (ref >59)
Globulin, Total: 2.9 g/dL (ref 1.5–4.5)
Glucose: 91 mg/dL (ref 65–99)
Potassium: 3.8 mmol/L (ref 3.5–5.2)
Sodium: 139 mmol/L (ref 134–144)
Total Protein: 7.1 g/dL (ref 6.0–8.5)

## 2018-03-13 LAB — TSH: TSH: 1.83 u[IU]/mL (ref 0.450–4.500)

## 2018-03-13 NOTE — Telephone Encounter (Signed)
Sent pt to [facility] medical neurology which they may can get pt in within 2 weeks as requested from Dr. Carlota Raspberry. I called [facility] on [address] Dr. Marciano Sequin office and left a message with the referral lady to give me a call back to see if we can get pt in within 2 weeks.

## 2018-03-27 ENCOUNTER — Ambulatory Visit (INDEPENDENT_AMBULATORY_CARE_PROVIDER_SITE_OTHER): Payer: Self-pay | Admitting: Emergency Medicine

## 2018-03-27 ENCOUNTER — Other Ambulatory Visit: Payer: Self-pay

## 2018-03-27 ENCOUNTER — Encounter: Payer: Self-pay | Admitting: Emergency Medicine

## 2018-03-27 VITALS — BP 136/85 | HR 93 | Temp 98.2°F | Resp 18 | Ht 65.0 in | Wt 187.6 lb

## 2018-03-27 DIAGNOSIS — G43C0 Periodic headache syndromes in child or adult, not intractable: Secondary | ICD-10-CM

## 2018-03-27 DIAGNOSIS — F0781 Postconcussional syndrome: Secondary | ICD-10-CM

## 2018-03-27 NOTE — Patient Instructions (Addendum)
Take Excedrin migraine medication as needed.    If you have lab work done today you will be contacted with your lab results within the next 2 weeks.  If you have not heard from [place] then please contact us. The fastest way to get your results is to register for My Chart.   IF you received an x-ray today, you will receive an invoice from [facility]. Please contact [facility] at [phone] with questions or concerns regarding your invoice.   IF you received labwork today, you will receive an invoice from [facility]. Please contact [facility] at [phone] with questions or concerns regarding your invoice.   Our billing staff will not be able to assist you with questions regarding bills from these companies.  You will be contacted with the lab results as soon as they are available. The fastest way to get your results is to activate your My Chart account. Instructions are located on the last page of this paperwork. If you have not heard from [place] regarding the results in 2 weeks, please contact this office.     Cefalea migraosa Migraine Headache Una cefalea migraosa es un dolor muy intenso y punzante en uno o ambos lados de la cabeza. Las migraas tambin pueden causar otros sntomas. Hable con su mdico [employer] factores que pueden causar Medical illustrator) las Psychologist, occupational. Siga estas indicaciones en su casa: Medicamentos  [employer] de venta libre y los recetados solamente como se lo haya indicado el mdico.  No conduzca ni use maquinaria pesada mientras toma analgsicos recetados.  A fin de prevenir o tratar el estreimiento mientras toma analgsicos recetados, el mdico puede recomendarle lo siguiente: ? Beber suficiente lquido para mantener el pis (orina) claro o de color amarillo plido. ? Tomar medicamentos recetados o de [employer]. ? Comer alimentos ricos en fibra. Entre ellos, frutas y verduras frescas, cereales integrales y frijoles. ? Limitar  los alimentos con alto contenido de grasas y azcares procesados. Estos incluyen alimentos fritos y dulces. Estilo de vida  Evite el alcohol.  No consuma ningn producto que contenga nicotina o tabaco, como cigarrillos y Psychologist, sport and exercise. Si necesita ayuda para dejar de fumar, consulte al mdico.  Duerma como mnimo 8horas todas las noches.  Evite las situaciones de estrs. Instrucciones generales   Lleve un registro diario para Neurosurgeon lo que Hotel manager. Por ejemplo, escriba: ? Lo que usted come y bebe. ? Cunto tiempo duerme. ? Algn cambio en lo que come o bebe. ? Algn cambio en sus medicamentos.  Si tiene una migraa: ? Evite los factores que [employer] sntomas, como las luces brillantes. ? Resulta til acostarse en una habitacin oscura y silenciosa. ? No conduzca vehculos ni opere maquinaria pesada. ? Pregntele al mdico qu actividades son seguras para usted.  Concurra a todas las visitas de control como se lo haya indicado el mdico. Esto es importante. Comunquese con un mdico si:  Tiene una migraa que es diferente o peor de sus migraas habituales. Solicite ayuda de inmediato si:  La migraa [employer].  Tiene fiebre.  Presenta rigidez en el cuello.  Tiene dificultad para ver.  Siente debilidad en los msculos o que no puede controlarlos.  Comienza a perder el equilibrio continuamente.  Comienza a tener dificultad para caminar.  Pierde el conocimiento (se desmaya). Esta informacin no tiene Marine scientist el consejo del mdico. Asegrese de hacerle al mdico cualquier pregunta que tenga. Document Released: 07/01/2008 Document Revised: 07/12/2016 Document Reviewed: 09/21/2015  Chartered certified accountant Patient Education  [employer].
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
## 2018-03-27 NOTE — Progress Notes (Signed)
Depression screen [facility] 2/9 03/27/2018 03/12/2018 03/07/2018 02/13/2018 02/07/2018  Decreased Interest 0 0 0 0 0  Down, Depressed, Hopeless 0 1 0 1 0  PHQ - 2 Score 0 1 0 1 0  Some recent data might be hidden

## 2018-03-30 ENCOUNTER — Other Ambulatory Visit: Payer: Self-pay | Admitting: Obstetrics & Gynecology

## 2018-03-30 ENCOUNTER — Encounter: Payer: Self-pay | Admitting: *Deleted

## 2018-03-30 NOTE — Telephone Encounter (Signed)
09-20-2017 CE "Menopausal syndrome Entering menopause with vasomotor menopausal symptoms.  History of pulmonary embolism in 2016.  Contraindication to hormone replacement therapy.  Treatment of vasomotor menopausal symptoms with an antidepressant reviewed.  Usage, risks and benefits reviewed.  Lexapro 10 mg 1 tablet per mouth daily prescribed.  Patient is worried that Lexapro will make it more difficult for her to lose weight.  May observe menopausal symptoms and start on Lexapro only if symptoms worsen."
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
## 2018-05-10 ENCOUNTER — Encounter

## 2018-05-10 ENCOUNTER — Encounter: Payer: Self-pay | Admitting: Neurology

## 2018-05-10 ENCOUNTER — Ambulatory Visit: Payer: Self-pay | Admitting: Neurology

## 2018-05-10 DIAGNOSIS — F0781 Postconcussional syndrome: Secondary | ICD-10-CM | POA: Diagnosis not present

## 2018-05-10 DIAGNOSIS — Z029 Encounter for administrative examinations, unspecified: Secondary | ICD-10-CM

## 2018-05-10 DIAGNOSIS — G44321 Chronic post-traumatic headache, intractable: Secondary | ICD-10-CM | POA: Diagnosis not present

## 2018-05-23 ENCOUNTER — Ambulatory Visit (INDEPENDENT_AMBULATORY_CARE_PROVIDER_SITE_OTHER): Payer: BLUE CROSS/BLUE SHIELD | Admitting: Psychology

## 2018-05-23 ENCOUNTER — Ambulatory Visit: Payer: Self-pay | Admitting: Psychology

## 2018-05-23 DIAGNOSIS — F419 Anxiety disorder, unspecified: Secondary | ICD-10-CM

## 2018-05-30 ENCOUNTER — Ambulatory Visit (HOSPITAL_COMMUNITY)
Admission: EM | Admit: 2018-05-30 | Discharge: 2018-05-30 | Disposition: A | Discharge status: Home / Self Care | Payer: BLUE CROSS/BLUE SHIELD | Attending: Internal Medicine | Admitting: Internal Medicine

## 2018-05-30 ENCOUNTER — Encounter (HOSPITAL_COMMUNITY): Payer: Self-pay

## 2018-05-30 DIAGNOSIS — J039 Acute tonsillitis, unspecified: Secondary | ICD-10-CM | POA: Diagnosis not present

## 2018-05-30 MED ORDER — AMOXICILLIN-POT CLAVULANATE 875-125 MG PO TABS: 1.0000 | ORAL_TABLET | Freq: Two times a day (BID) | ORAL | 0 refills | Status: AC

## 2018-05-30 NOTE — ED Triage Notes (Signed)
Pt presents with sore throat X 5 days.

## 2018-05-30 NOTE — Discharge Instructions (Addendum)
Toma Augmentin dos veces cada dia para 10 dias Ibuprofen y Tylenol para dolor y inflammacion Empiece zyrtec/claritin diaria para ayuda con congestion y muco en su garganta  Regrese si no mejoran, sinotmas mas peor

## 2018-05-31 NOTE — ED Provider Notes (Signed)
[facility]    CSN: [id number] Arrival date & time: 05/30/18  1853     History   Chief Complaint Chief Complaint  Patient presents with  . Sore Throat    HPI [facility] [name] is a 48 y.o. female history of previous PE, presenting today for evaluation of sore throat.  Patient states that she has had a sore throat for the past 3 days.  Prior to this she has noticed mucus in her throat.  It is felt very inflamed and swollen.  She is also noticed spots of pus on her tonsils.  She denies any fevers.  She has had some mild congestion with this.  Denies cough.  She is concerned as she feels as if something is blocking her throat when she breathes.  She is tried Chloraseptic lozenges without relief.  HPI  Past Medical History:  Diagnosis Date  . Abnormal Pap smear    patient states she had cancer that was removed from her cervix  . Asthma    when pregnant/ once 6 yrs ago only time asthma attack the dr said  . Cancer ([place])   . Clotting disorder ([place])   . Gestational diabetes   . Intramural leiomyoma of uterus 02/29/2016  . Pulmonary embolism ([place])   . Renal insufficiency   . Vitamin D deficiency   . Vitamin D deficiency     Patient Active Problem List   Diagnosis Date Noted  . Chest wall muscle strain 12/25/2017  . History of UTI 12/25/2017  . Chest wall contusion, right, initial encounter 12/11/2017  . Back pain 12/04/2017  . Acute lumbar myofascial strain 12/04/2017  . Muscle spasm 12/04/2017  . Musculoskeletal pain 12/04/2017  . Skin infection 09/26/2017  . Acute laryngitis 05/17/2017  . Skin lesion of back 02/13/2017  . Intramural leiomyoma of uterus 02/29/2016  . GERD (gastroesophageal reflux disease) 10/19/2015  . Dysuria 06/11/2015  . History of pulmonary embolus (PE) 02/24/2015  . Sciatica of right side 11/24/2014  . Acute UTI 11/09/2014  . Hx of pulmonary embolus during pregnancy 11/09/2014    Past Surgical History:  Procedure Laterality  Date  . CHOLECYSTECTOMY      OB History    Gravida  5   Para  5   Term  5   Preterm      AB      Living  5     SAB      TAB      Ectopic      Multiple  0   Live Births  4            Home Medications    Prior to Admission medications   Medication Sig Start Date End Date Taking? Authorizing Provider  amitriptyline (ELAVIL) 10 MG tablet Take 1 tablet (10 mg total) by mouth at bedtime. 02/07/18   Wendie Agreste, MD  amoxicillin-clavulanate (AUGMENTIN) 875-125 MG tablet Take 1 tablet by mouth every 12 (twelve) hours for 10 days. 05/30/18 06/09/18  Rumi Taras C, PA-C  cyclobenzaprine (FLEXERIL) 10 MG tablet Take 1 tablet (10 mg total) by mouth 2 (two) times daily as needed for muscle spasms. 03/08/18   Duffy Bruce, MD  escitalopram (LEXAPRO) 10 MG tablet TOME UNA TABLETA TODOS LOS DIAS 03/30/18   Princess Bruins, MD  fluticasone (FLONASE) 50 MCG/ACT nasal spray Place 1 spray into both nostrils 2 (two) times daily. 06/22/17   Rutherford Guys, MD  gabapentin (NEURONTIN) 100 MG capsule Take  1 capsule (100 mg total) by mouth 3 (three) times daily for 10 days. 03/08/18 03/18/18  Duffy Bruce, MD  traMADol (ULTRAM) 50 MG tablet Take 1 tablet (50 mg total) by mouth every 6 (six) hours as needed. Patient not taking: Reported on 03/27/2018 02/21/18   Carmin Muskrat, MD    Family History Family History  Problem Relation Age of Onset  . Diabetes Mother   . Hypertension Mother   . Hyperlipidemia Father   . Diabetes Father   . Hypertension Father   . Heart disease Sister   . Diabetes Brother   . Hyperlipidemia Brother   . Drug abuse Paternal Grandmother   . Kidney disease Other   . Birth defects Other        anal atresia/stenosis    Social History Social History   Tobacco Use  . Smoking status: Never Smoker  . Smokeless tobacco: Never Used  Substance Use Topics  . Alcohol use: Yes    Alcohol/week: 0.0 standard drinks    Comment: Occasionally.  .  Drug use: No     Allergies   Latex   Review of Systems Review of Systems  Constitutional: Negative for activity change, appetite change, chills, fatigue and fever.  HENT: Positive for congestion, rhinorrhea and sore throat. Negative for ear pain, sinus pressure and trouble swallowing.   Eyes: Negative for discharge and redness.  Respiratory: Negative for cough, chest tightness and shortness of breath.   Cardiovascular: Negative for chest pain.  Gastrointestinal: Negative for abdominal pain, diarrhea, nausea and vomiting.  Musculoskeletal: Negative for myalgias.  Skin: Negative for rash.  Neurological: Negative for dizziness, light-headedness and headaches.     Physical Exam Triage Vital Signs ED Triage Vitals  Enc Vitals Group     BP 05/30/18 1936 117/82     Pulse Rate 05/30/18 1936 81     Resp 05/30/18 1936 18     Temp 05/30/18 1936 97.7 F (36.5 C)     Temp Source 05/30/18 1936 Oral     SpO2 05/30/18 1936 99 %     Weight --      Height --      Head Circumference --      Peak Flow --      Pain Score 05/30/18 1938 5     Pain Loc --      Pain Edu? --      Excl. in [place]? --    No data found.  Updated Vital Signs BP 117/82 (BP Location: Right Arm)   Pulse 81   Temp 97.7 F (36.5 C) (Oral)   Resp 18   LMP 05/06/2018   SpO2 99%   Visual Acuity Right Eye Distance:   Left Eye Distance:   Bilateral Distance:    Right Eye Near:   Left Eye Near:    Bilateral Near:     Physical Exam Vitals signs and nursing note reviewed.  Constitutional:      General: She is not in acute distress.    Appearance: She is well-developed.  HENT:     Head: Normocephalic and atraumatic.     Ears:     Comments: Bilateral ears without tenderness to palpation of external auricle, tragus and mastoid, EAC's without erythema or swelling, TM's with good bony landmarks and cone of light. Non erythematous.     Mouth/Throat:     Comments: Bilateral tonsils significantly enlarged with mild  erythema, no exudate, posterior pharynx patent, no uvula swelling or deviation Eyes:  Conjunctiva/sclera: Conjunctivae normal.  Neck:     Musculoskeletal: Neck supple.  Cardiovascular:     Rate and Rhythm: Normal rate and regular rhythm.     Heart sounds: No murmur.  Pulmonary:     Effort: Pulmonary effort is normal. No respiratory distress.     Breath sounds: Normal breath sounds.     Comments: Breathing comfortably at rest, CTABL, no wheezing, rales or other adventitious sounds auscultated Abdominal:     Palpations: Abdomen is soft.     Tenderness: There is no abdominal tenderness.  Skin:    General: Skin is warm and dry.  Neurological:     Mental Status: She is alert.      UC Treatments / Results  Labs (all labs ordered are listed, but only abnormal results are displayed) Labs Reviewed - No data to display  EKG None  Radiology No results found.  Procedures Procedures (including critical care time)  Medications Ordered in UC Medications - No data to display  Initial Impression / Assessment and Plan / UC Course  I have reviewed the triage vital signs and the nursing notes.  Pertinent labs & imaging results that were available during my care of the patient were reviewed by me and considered in my medical decision making (see chart for details).     We will treat patient for tonsillitis with Augmentin twice daily x10 days.  Tylenol and ibuprofen for further pain and inflammation.  Zyrtec and Claritin to help with any drainage that is contributing to symptoms.  Continue to monitor,Discussed strict return precautions. Patient verbalized understanding and is agreeable with plan.  Final Clinical Impressions(s) / UC Diagnoses   Final diagnoses:  Tonsillitis     Discharge Instructions     Toma Augmentin dos veces cada dia para 10 dias Ibuprofen y Tylenol para dolor y inflammacion Empiece zyrtec/claritin diaria para ayuda con congestion y muco en su  garganta  Regrese si no mejoran, sinotmas mas peor   ED Prescriptions    Medication Sig Dispense Auth. Provider   amoxicillin-clavulanate (AUGMENTIN) 875-125 MG tablet Take 1 tablet by mouth every 12 (twelve) hours for 10 days. 20 tablet Zared Knoth, [place] C, PA-C     Controlled Substance Prescriptions [place] Controlled Substance Registry consulted? Not Applicable   Janith Lima, [place] 05/31/18 1234

## 2018-06-05 ENCOUNTER — Ambulatory Visit (HOSPITAL_COMMUNITY)
Admission: EM | Admit: 2018-06-05 | Discharge: 2018-06-05 | Disposition: A | Discharge status: Home / Self Care | Payer: BLUE CROSS/BLUE SHIELD | Source: Home / Self Care

## 2018-06-05 ENCOUNTER — Encounter (HOSPITAL_COMMUNITY): Payer: Self-pay

## 2018-06-05 ENCOUNTER — Emergency Department (HOSPITAL_COMMUNITY)
Admission: EM | Admit: 2018-06-05 | Discharge: 2018-06-06 | Disposition: A | Discharge status: Home / Self Care | Payer: BLUE CROSS/BLUE SHIELD | Attending: Emergency Medicine | Admitting: Emergency Medicine

## 2018-06-05 DIAGNOSIS — G4489 Other headache syndrome: Secondary | ICD-10-CM | POA: Diagnosis not present

## 2018-06-05 DIAGNOSIS — Z91040 Latex allergy status: Secondary | ICD-10-CM | POA: Insufficient documentation

## 2018-06-05 DIAGNOSIS — R42 Dizziness and giddiness: Secondary | ICD-10-CM | POA: Diagnosis not present

## 2018-06-05 DIAGNOSIS — R110 Nausea: Secondary | ICD-10-CM | POA: Diagnosis not present

## 2018-06-05 DIAGNOSIS — R51 Headache: Secondary | ICD-10-CM | POA: Diagnosis not present

## 2018-06-05 DIAGNOSIS — Z79899 Other long term (current) drug therapy: Secondary | ICD-10-CM | POA: Diagnosis not present

## 2018-06-05 DIAGNOSIS — Z8541 Personal history of malignant neoplasm of cervix uteri: Secondary | ICD-10-CM | POA: Diagnosis not present

## 2018-06-05 DIAGNOSIS — J45909 Unspecified asthma, uncomplicated: Secondary | ICD-10-CM | POA: Diagnosis not present

## 2018-06-05 LAB — COMPREHENSIVE METABOLIC PANEL
ALT: 13 U/L (ref 0–44)
AST: 20 U/L (ref 15–41)
Albumin: 3.7 g/dL (ref 3.5–5.0)
Alkaline Phosphatase: 69 U/L (ref 38–126)
Anion gap: 11 (ref 5–15)
BUN: 13 mg/dL (ref 6–20)
CO2: 22 mmol/L (ref 22–32)
Calcium: 8.7 mg/dL — ABNORMAL LOW (ref 8.9–10.3)
Chloride: 103 mmol/L (ref 98–111)
Creatinine, Ser: 0.67 mg/dL (ref 0.44–1.00)
GFR calc Af Amer: >60 mL/min (ref >60)
GFR calc non Af Amer: >60 mL/min (ref >60)
Glucose, Bld: 115 mg/dL — ABNORMAL HIGH (ref 70–99)
Potassium: 4.1 mmol/L (ref 3.5–5.1)
Sodium: 136 mmol/L (ref 135–145)
Total Bilirubin: 0.8 mg/dL (ref 0.3–1.2)
Total Protein: 7.2 g/dL (ref 6.5–8.1)

## 2018-06-05 LAB — URINALYSIS, ROUTINE W REFLEX MICROSCOPIC
Bacteria, UA: NONE SEEN
Bilirubin Urine: NEGATIVE
Glucose, UA: NEGATIVE mg/dL
Hgb urine dipstick: NEGATIVE
Ketones, ur: NEGATIVE mg/dL
Nitrite: NEGATIVE
Protein, ur: NEGATIVE mg/dL
Specific Gravity, Urine: 1.026 (ref 1.005–1.030)
pH: 6 (ref 5.0–8.0)

## 2018-06-05 LAB — CBC
HCT: 39.9 % (ref 36.0–46.0)
Hemoglobin: 13.5 g/dL (ref 12.0–15.0)
MCH: 30.2 pg (ref 26.0–34.0)
MCHC: 33.8 g/dL (ref 30.0–36.0)
MCV: 89.3 fL (ref 80.0–100.0)
Platelets: 239 10*3/uL (ref 150–400)
RBC: 4.47 MIL/uL (ref 3.87–5.11)
RDW: 12.4 % (ref 11.5–15.5)
WBC: 22 10*3/uL — ABNORMAL HIGH (ref 4.0–10.5)
nRBC: 0 % (ref 0.0–0.2)

## 2018-06-05 LAB — LIPASE, BLOOD: Lipase: 21 U/L (ref 11–51)

## 2018-06-05 LAB — I-STAT BETA HCG BLOOD, ED (MC, WL, AP ONLY): I-stat hCG, quantitative: <5 m[IU]/mL (ref <5)

## 2018-06-05 MED ORDER — SODIUM CHLORIDE 0.9% FLUSH
3.0000 mL | Freq: Once | INTRAVENOUS | Status: AC
  Administered 2018-06-06: 3 mL via INTRAVENOUS

## 2018-06-05 NOTE — ED Triage Notes (Signed)
Pt here with nausea and vomiting for last 2 days.  Stating she had a headache as well.  Took motrin PTA. A&Ox4.

## 2018-06-05 NOTE — ED Notes (Addendum)
Pt sent to ER due to dizziness, blurred vision, HA, x1 week okay per Dr. Sabra Heck, pt has hx of aneurysm and sees a neurologist for it. Family took pt to ER

## 2018-06-06 MED ORDER — METOCLOPRAMIDE HCL 10 MG PO TABS: 10.0000 mg | ORAL_TABLET | Freq: Four times a day (QID) | ORAL | 0 refills | Status: DC | PRN

## 2018-06-06 MED ORDER — ALUM & MAG HYDROXIDE-SIMETH 200-200-20 MG/5ML PO SUSP
30.0000 mL | Freq: Once | ORAL | Status: AC
  Administered 2018-06-06: 30 mL via ORAL
  Filled 2018-06-06: qty 30

## 2018-06-06 MED ORDER — METOCLOPRAMIDE HCL 5 MG/ML IJ SOLN
10.0000 mg | Freq: Once | INTRAMUSCULAR | Status: AC
  Administered 2018-06-06: 10 mg via INTRAVENOUS
  Filled 2018-06-06: qty 2

## 2018-06-06 MED ORDER — DIPHENHYDRAMINE HCL 50 MG/ML IJ SOLN
25.0000 mg | Freq: Once | INTRAMUSCULAR | Status: AC
  Administered 2018-06-06: 25 mg via INTRAVENOUS
  Filled 2018-06-06: qty 1
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
## 2018-06-11 DIAGNOSIS — I671 Cerebral aneurysm, nonruptured: Secondary | ICD-10-CM | POA: Diagnosis not present

## 2018-06-12 DIAGNOSIS — I671 Cerebral aneurysm, nonruptured: Secondary | ICD-10-CM | POA: Diagnosis not present

## 2018-06-12 DIAGNOSIS — G44321 Chronic post-traumatic headache, intractable: Secondary | ICD-10-CM | POA: Diagnosis not present

## 2018-06-12 DIAGNOSIS — F0781 Postconcussional syndrome: Secondary | ICD-10-CM | POA: Diagnosis not present

## 2018-06-13 ENCOUNTER — Ambulatory Visit (INDEPENDENT_AMBULATORY_CARE_PROVIDER_SITE_OTHER): Payer: BLUE CROSS/BLUE SHIELD | Admitting: Psychology

## 2018-06-13 DIAGNOSIS — F413 Other mixed anxiety disorders: Secondary | ICD-10-CM | POA: Diagnosis not present

## 2018-07-02 ENCOUNTER — Ambulatory Visit (INDEPENDENT_AMBULATORY_CARE_PROVIDER_SITE_OTHER): Payer: BLUE CROSS/BLUE SHIELD | Admitting: Psychology

## 2018-07-02 DIAGNOSIS — F4323 Adjustment disorder with mixed anxiety and depressed mood: Secondary | ICD-10-CM

## 2018-07-09 DIAGNOSIS — G44329 Chronic post-traumatic headache, not intractable: Secondary | ICD-10-CM | POA: Diagnosis not present

## 2018-07-09 DIAGNOSIS — R51 Headache: Secondary | ICD-10-CM | POA: Diagnosis not present

## 2018-07-09 DIAGNOSIS — Z79899 Other long term (current) drug therapy: Secondary | ICD-10-CM | POA: Diagnosis not present

## 2018-07-09 DIAGNOSIS — Z049 Encounter for examination and observation for unspecified reason: Secondary | ICD-10-CM | POA: Diagnosis not present

## 2018-07-16 ENCOUNTER — Ambulatory Visit: Payer: BLUE CROSS/BLUE SHIELD | Admitting: Psychology

## 2018-07-18 ENCOUNTER — Ambulatory Visit (INDEPENDENT_AMBULATORY_CARE_PROVIDER_SITE_OTHER): Payer: BLUE CROSS/BLUE SHIELD | Admitting: Psychology

## 2018-07-18 DIAGNOSIS — F4323 Adjustment disorder with mixed anxiety and depressed mood: Secondary | ICD-10-CM

## 2018-08-01 DIAGNOSIS — G44329 Chronic post-traumatic headache, not intractable: Secondary | ICD-10-CM | POA: Diagnosis not present

## 2018-08-01 DIAGNOSIS — M542 Cervicalgia: Secondary | ICD-10-CM | POA: Diagnosis not present

## 2018-08-01 DIAGNOSIS — M791 Myalgia, unspecified site: Secondary | ICD-10-CM | POA: Diagnosis not present

## 2018-08-02 ENCOUNTER — Ambulatory Visit (INDEPENDENT_AMBULATORY_CARE_PROVIDER_SITE_OTHER): Payer: BLUE CROSS/BLUE SHIELD | Admitting: Psychology

## 2018-08-02 DIAGNOSIS — F411 Generalized anxiety disorder: Secondary | ICD-10-CM | POA: Diagnosis not present

## 2018-08-16 ENCOUNTER — Ambulatory Visit (INDEPENDENT_AMBULATORY_CARE_PROVIDER_SITE_OTHER): Payer: BLUE CROSS/BLUE SHIELD | Admitting: Psychology

## 2018-08-16 DIAGNOSIS — G518 Other disorders of facial nerve: Secondary | ICD-10-CM | POA: Diagnosis not present

## 2018-08-16 DIAGNOSIS — M791 Myalgia, unspecified site: Secondary | ICD-10-CM | POA: Diagnosis not present

## 2018-08-16 DIAGNOSIS — M542 Cervicalgia: Secondary | ICD-10-CM | POA: Diagnosis not present

## 2018-08-16 DIAGNOSIS — G44329 Chronic post-traumatic headache, not intractable: Secondary | ICD-10-CM | POA: Diagnosis not present

## 2018-08-16 DIAGNOSIS — F4323 Adjustment disorder with mixed anxiety and depressed mood: Secondary | ICD-10-CM | POA: Diagnosis not present

## 2018-08-27 ENCOUNTER — Ambulatory Visit (INDEPENDENT_AMBULATORY_CARE_PROVIDER_SITE_OTHER): Payer: BLUE CROSS/BLUE SHIELD | Admitting: Psychology

## 2018-08-27 DIAGNOSIS — F4323 Adjustment disorder with mixed anxiety and depressed mood: Secondary | ICD-10-CM

## 2018-08-28 ENCOUNTER — Ambulatory Visit: Payer: BLUE CROSS/BLUE SHIELD | Admitting: Psychology

## 2018-08-30 DIAGNOSIS — M542 Cervicalgia: Secondary | ICD-10-CM | POA: Diagnosis not present

## 2018-08-30 DIAGNOSIS — G44329 Chronic post-traumatic headache, not intractable: Secondary | ICD-10-CM | POA: Diagnosis not present

## 2018-09-11 ENCOUNTER — Ambulatory Visit (INDEPENDENT_AMBULATORY_CARE_PROVIDER_SITE_OTHER): Payer: BLUE CROSS/BLUE SHIELD | Admitting: Psychology

## 2018-09-11 DIAGNOSIS — F4323 Adjustment disorder with mixed anxiety and depressed mood: Secondary | ICD-10-CM

## 2018-09-13 DIAGNOSIS — I671 Cerebral aneurysm, nonruptured: Secondary | ICD-10-CM | POA: Diagnosis not present

## 2018-09-13 DIAGNOSIS — F0781 Postconcussional syndrome: Secondary | ICD-10-CM | POA: Diagnosis not present

## 2018-09-13 DIAGNOSIS — G44329 Chronic post-traumatic headache, not intractable: Secondary | ICD-10-CM | POA: Diagnosis not present

## 2018-09-13 DIAGNOSIS — G44321 Chronic post-traumatic headache, intractable: Secondary | ICD-10-CM | POA: Diagnosis not present

## 2018-09-13 DIAGNOSIS — M542 Cervicalgia: Secondary | ICD-10-CM | POA: Diagnosis not present

## 2018-09-24 ENCOUNTER — Ambulatory Visit (INDEPENDENT_AMBULATORY_CARE_PROVIDER_SITE_OTHER): Payer: BLUE CROSS/BLUE SHIELD | Admitting: Psychology

## 2018-09-24 DIAGNOSIS — F4323 Adjustment disorder with mixed anxiety and depressed mood: Secondary | ICD-10-CM

## 2018-09-28 DIAGNOSIS — G44329 Chronic post-traumatic headache, not intractable: Secondary | ICD-10-CM | POA: Diagnosis not present

## 2018-09-28 DIAGNOSIS — M542 Cervicalgia: Secondary | ICD-10-CM | POA: Diagnosis not present

## 2018-10-08 ENCOUNTER — Ambulatory Visit: Payer: BLUE CROSS/BLUE SHIELD | Admitting: Psychology

## 2018-10-11 ENCOUNTER — Ambulatory Visit (INDEPENDENT_AMBULATORY_CARE_PROVIDER_SITE_OTHER): Payer: BC Managed Care – PPO | Admitting: Psychology

## 2018-10-11 DIAGNOSIS — F4325 Adjustment disorder with mixed disturbance of emotions and conduct: Secondary | ICD-10-CM

## 2018-10-16 DIAGNOSIS — M542 Cervicalgia: Secondary | ICD-10-CM | POA: Diagnosis not present

## 2018-10-16 DIAGNOSIS — G44329 Chronic post-traumatic headache, not intractable: Secondary | ICD-10-CM | POA: Diagnosis not present

## 2018-10-23 ENCOUNTER — Ambulatory Visit (INDEPENDENT_AMBULATORY_CARE_PROVIDER_SITE_OTHER): Payer: BC Managed Care – PPO | Admitting: Psychology

## 2018-10-23 DIAGNOSIS — F4323 Adjustment disorder with mixed anxiety and depressed mood: Secondary | ICD-10-CM

## 2018-11-05 DIAGNOSIS — I671 Cerebral aneurysm, nonruptured: Secondary | ICD-10-CM | POA: Diagnosis not present

## 2018-11-06 ENCOUNTER — Ambulatory Visit (INDEPENDENT_AMBULATORY_CARE_PROVIDER_SITE_OTHER): Payer: BC Managed Care – PPO | Admitting: Psychology

## 2018-11-06 DIAGNOSIS — F4323 Adjustment disorder with mixed anxiety and depressed mood: Secondary | ICD-10-CM | POA: Diagnosis not present

## 2018-11-27 DIAGNOSIS — G44329 Chronic post-traumatic headache, not intractable: Secondary | ICD-10-CM | POA: Diagnosis not present

## 2018-11-27 DIAGNOSIS — M542 Cervicalgia: Secondary | ICD-10-CM | POA: Diagnosis not present

## 2018-11-29 DIAGNOSIS — G44321 Chronic post-traumatic headache, intractable: Secondary | ICD-10-CM | POA: Diagnosis not present

## 2018-11-29 DIAGNOSIS — I671 Cerebral aneurysm, nonruptured: Secondary | ICD-10-CM | POA: Diagnosis not present

## 2018-11-29 DIAGNOSIS — F0781 Postconcussional syndrome: Secondary | ICD-10-CM | POA: Diagnosis not present

## 2018-12-03 ENCOUNTER — Other Ambulatory Visit: Payer: Self-pay

## 2018-12-03 ENCOUNTER — Telehealth: Payer: BC Managed Care – PPO | Admitting: Family Medicine

## 2018-12-04 ENCOUNTER — Ambulatory Visit (INDEPENDENT_AMBULATORY_CARE_PROVIDER_SITE_OTHER): Payer: BC Managed Care – PPO | Admitting: Psychology

## 2018-12-04 DIAGNOSIS — F4323 Adjustment disorder with mixed anxiety and depressed mood: Secondary | ICD-10-CM | POA: Diagnosis not present

## 2018-12-06 ENCOUNTER — Ambulatory Visit: Payer: Self-pay | Admitting: Family Medicine

## 2018-12-18 ENCOUNTER — Ambulatory Visit (INDEPENDENT_AMBULATORY_CARE_PROVIDER_SITE_OTHER): Payer: BC Managed Care – PPO | Admitting: Psychology

## 2018-12-18 DIAGNOSIS — F4323 Adjustment disorder with mixed anxiety and depressed mood: Secondary | ICD-10-CM

## 2019-01-01 ENCOUNTER — Ambulatory Visit (INDEPENDENT_AMBULATORY_CARE_PROVIDER_SITE_OTHER): Payer: BC Managed Care – PPO | Admitting: Psychology

## 2019-01-01 DIAGNOSIS — F4323 Adjustment disorder with mixed anxiety and depressed mood: Secondary | ICD-10-CM | POA: Diagnosis not present

## 2019-01-08 DIAGNOSIS — G44329 Chronic post-traumatic headache, not intractable: Secondary | ICD-10-CM | POA: Diagnosis not present

## 2019-01-08 DIAGNOSIS — M542 Cervicalgia: Secondary | ICD-10-CM | POA: Diagnosis not present

## 2019-01-15 ENCOUNTER — Ambulatory Visit (INDEPENDENT_AMBULATORY_CARE_PROVIDER_SITE_OTHER): Payer: BC Managed Care – PPO | Admitting: Psychology

## 2019-01-15 DIAGNOSIS — F4323 Adjustment disorder with mixed anxiety and depressed mood: Secondary | ICD-10-CM

## 2019-01-29 ENCOUNTER — Ambulatory Visit (INDEPENDENT_AMBULATORY_CARE_PROVIDER_SITE_OTHER): Payer: BC Managed Care – PPO | Admitting: Psychology

## 2019-01-29 DIAGNOSIS — F4323 Adjustment disorder with mixed anxiety and depressed mood: Secondary | ICD-10-CM | POA: Diagnosis not present

## 2019-02-11 ENCOUNTER — Other Ambulatory Visit: Payer: Self-pay

## 2019-02-11 ENCOUNTER — Encounter: Payer: Self-pay | Admitting: Emergency Medicine

## 2019-02-11 ENCOUNTER — Ambulatory Visit: Payer: BC Managed Care – PPO | Admitting: Emergency Medicine

## 2019-02-11 VITALS — BP 118/81 | HR 99 | Temp 98.9°F | Resp 16 | Ht 65.0 in | Wt 203.4 lb

## 2019-02-11 DIAGNOSIS — Z23 Encounter for immunization: Secondary | ICD-10-CM

## 2019-02-11 DIAGNOSIS — R350 Frequency of micturition: Secondary | ICD-10-CM

## 2019-02-11 DIAGNOSIS — Z87448 Personal history of other diseases of urinary system: Secondary | ICD-10-CM

## 2019-02-11 DIAGNOSIS — B999 Unspecified infectious disease: Secondary | ICD-10-CM | POA: Diagnosis not present

## 2019-02-11 LAB — POCT URINALYSIS DIP (MANUAL ENTRY)
Bilirubin, UA: NEGATIVE
Glucose, UA: NEGATIVE mg/dL
Ketones, POC UA: NEGATIVE mg/dL
Leukocytes, UA: NEGATIVE
Nitrite, UA: NEGATIVE
Protein Ur, POC: NEGATIVE mg/dL
Spec Grav, UA: 1.025 (ref 1.010–1.025)
Urobilinogen, UA: 1 E.U./dL
pH, UA: 7 (ref 5.0–8.0)

## 2019-02-11 LAB — POC MICROSCOPIC URINALYSIS (UMFC): Mucus: ABSENT

## 2019-02-11 LAB — POCT URINE PREGNANCY: Preg Test, Ur: NEGATIVE

## 2019-02-11 MED ORDER — SULFAMETHOXAZOLE-TRIMETHOPRIM 800-160 MG PO TABS: 1.0000 | ORAL_TABLET | Freq: Two times a day (BID) | ORAL | 0 refills | Status: AC

## 2019-02-11 NOTE — Progress Notes (Signed)
[facility] 48 y.o.   Chief Complaint  Patient presents with  . Urinary Frequency    x 3 weeks sometimes and it burns to pee pee   . Back Pain    HISTORY OF PRESENT ILLNESS: This is a 48 y.o. female complaining of urinary frequency along with intermittent low back pain for the past 3 weeks.  Also complaining of burning with urination but denies fever, chills, nausea or vomiting, hematuria, or any other significant symptoms.  Denies injuries.  Denies pregnancy.  No other complaints or medical concerns today.  HPI   Prior to Admission medications   Medication Sig Start Date End Date Taking? Authorizing Provider  escitalopram (LEXAPRO) 10 MG tablet TOME UNA TABLETA TODOS LOS DIAS Patient taking differently: Take 10 mg by mouth daily.  03/30/18  Yes Princess Bruins, MD  zonisamide (ZONEGRAN) 50 MG capsule Take 50 mg by mouth daily.   Yes [provider]  amitriptyline (ELAVIL) 10 MG tablet Take 1 tablet (10 mg total) by mouth at bedtime. Patient not taking: Reported on 06/06/2018 02/07/18   Wendie Agreste, MD  amitriptyline (ELAVIL) 25 MG tablet Take 25 mg by mouth at bedtime. 05/27/18   [provider]  fluticasone (FLONASE) 50 MCG/ACT nasal spray Place 1 spray into both nostrils 2 (two) times daily. Patient not taking: Reported on 06/06/2018 06/22/17   Rutherford Guys, MD  gabapentin (NEURONTIN) 100 MG capsule Take 1 capsule (100 mg total) by mouth 3 (three) times daily for 10 days. Patient not taking: Reported on 06/06/2018 03/08/18 06/07/27  Duffy Bruce, MD  ibuprofen (ADVIL,MOTRIN) 200 MG tablet Take 200 mg by mouth every 6 (six) hours as needed for mild pain.    [provider]  metoCLOPramide (REGLAN) 10 MG tablet Take 1 tablet (10 mg total) by mouth every 6 (six) hours as needed for nausea (nausea/headache). Patient not taking: Reported on 02/11/2019 06/06/18   Ripley Fraise, MD  traMADol (ULTRAM) 50 MG tablet Take 1 tablet (50 mg total)  by mouth every 6 (six) hours as needed. Patient not taking: Reported on 03/27/2018 02/21/18   Carmin Muskrat, MD    Allergies  Allergen Reactions  . Latex Itching and Rash    Itching with use of condoms    Patient Active Problem List   Diagnosis Date Noted  . Intramural leiomyoma of uterus 02/29/2016  . GERD (gastroesophageal reflux disease) 10/19/2015  . History of pulmonary embolus (PE) 02/24/2015  . Hx of pulmonary embolus during pregnancy 11/09/2014    Past Medical History:  Diagnosis Date  . Abnormal Pap smear    patient states she had cancer that was removed from her cervix  . Asthma    when pregnant/ once 6 yrs ago only time asthma attack the dr said  . Cancer ([place])   . Clotting disorder ([place])   . Gestational diabetes   . Intramural leiomyoma of uterus 02/29/2016  . Pulmonary embolism ([place])   . Renal insufficiency   . Vitamin D deficiency   . Vitamin D deficiency     Past Surgical History:  Procedure Laterality Date  . CHOLECYSTECTOMY      Social History   Socioeconomic History  . Marital status: Married    Spouse name: Not on file  . Number of children: 5  . Years of education: Not on file  . Highest education level: Not on file  Occupational History  . Occupation: Stay at [employer]  . Financial  resource strain: Not on file  . Food insecurity    Worry: Not on file    Inability: Not on file  . Transportation needs    Medical: Not on file    Non-medical: Not on file  Tobacco Use  . Smoking status: Never Smoker  . Smokeless tobacco: Never Used  Substance and Sexual Activity  . Alcohol use: Yes    Alcohol/week: 0.0 standard drinks    Comment: Occasionally.  . Drug use: No  . Sexual activity: Yes    Partners: Male    Birth control/protection: Condom  Lifestyle  . Physical activity    Days per week: Not on file    Minutes per session: Not on file  . Stress: Not on file  Relationships  . Social Herbalist on phone:  Not on file    Gets together: Not on file    Attends religious service: Not on file    Active member of club or organization: Not on file    Attends meetings of clubs or organizations: Not on file    Relationship status: Not on file  . Intimate partner violence    Fear of current or ex partner: Not on file    Emotionally abused: Not on file    Physically abused: Not on file    Forced sexual activity: Not on file  Other Topics Concern  . Not on file  Social History Narrative   Married with five children. Came to [place] from [place] in 2001. Unemployed currently. Spanish speaking.    Family History  Problem Relation Age of Onset  . Diabetes Mother   . Hypertension Mother   . Hyperlipidemia Father   . Diabetes Father   . Hypertension Father   . Heart disease Sister   . Diabetes Brother   . Hyperlipidemia Brother   . Drug abuse Paternal Grandmother   . Kidney disease Other   . Birth defects Other        anal atresia/stenosis     Review of Systems  Constitutional: Negative.  Negative for chills and fever.  HENT: Negative.  Negative for congestion and sore throat.   Respiratory: Negative.  Negative for cough and shortness of breath.   Cardiovascular: Negative.  Negative for chest pain and palpitations.  Gastrointestinal: Negative for abdominal pain, diarrhea, nausea and vomiting.  Genitourinary: Positive for dysuria and frequency. Negative for flank pain, hematuria and urgency.  Musculoskeletal: Positive for back pain.  Skin: Negative.  Negative for rash.  Neurological: Negative.  Negative for dizziness and headaches.  All other systems reviewed and are negative.  Vitals:   02/11/19 1614  BP: 118/81  Pulse: 99  Resp: 16  Temp: 98.9 F (37.2 C)  SpO2: 96%     Physical Exam Vitals signs reviewed.  Constitutional:      Appearance: Normal appearance.  HENT:     Head: Normocephalic.  Eyes:     Extraocular Movements: Extraocular movements intact.      Conjunctiva/sclera: Conjunctivae normal.     Pupils: Pupils are equal, round, and reactive to light.  Cardiovascular:     Rate and Rhythm: Normal rate and regular rhythm.     Pulses: Normal pulses.     Heart sounds: Normal heart sounds.  Pulmonary:     Effort: Pulmonary effort is normal.     Breath sounds: Normal breath sounds.  Abdominal:     General: There is no distension.     Palpations: Abdomen is soft.  Tenderness: There is no abdominal tenderness.  Musculoskeletal: Normal range of motion.  Skin:    General: Skin is warm and dry.     Capillary Refill: Capillary refill takes less than 2 seconds.  Neurological:     General: No focal deficit present.     Mental Status: She is alert and oriented to person, place, and time.  Psychiatric:        Mood and Affect: Mood normal.        Behavior: Behavior normal.    Results for orders placed or performed in visit on 02/11/19 (from the past 24 hour(s))  POCT urinalysis dipstick     Status: Abnormal   Collection Time: 02/11/19  4:37 PM  Result Value Ref Range   Color, UA yellow yellow   Clarity, UA clear clear   Glucose, UA negative negative mg/dL   Bilirubin, UA negative negative   Ketones, POC UA negative negative mg/dL   Spec Grav, UA 1.025 1.010 - 1.025   Blood, UA trace-lysed (A) negative   pH, UA 7.0 5.0 - 8.0   Protein Ur, POC negative negative mg/dL   Urobilinogen, UA 1.0 0.2 or 1.0 E.U./dL   Nitrite, UA Negative Negative   Leukocytes, UA Negative Negative  POCT urine pregnancy     Status: None   Collection Time: 02/11/19  4:37 PM  Result Value Ref Range   Preg Test, Ur Negative Negative  POCT Microscopic Urinalysis ([facility])     Status: Abnormal   Collection Time: 02/11/19  4:39 PM  Result Value Ref Range   WBC,UR,HPF,POC None None WBC/hpf   RBC,UR,HPF,POC None None RBC/hpf   Bacteria None None, Too numerous to count   Mucus Absent Absent   Epithelial Cells, UR Per Microscopy Few (A) None, Too numerous to count  cells/hpf   A total of 25 minutes was spent in the room with the patient, greater than 50% of which was in counseling/coordination of care regarding diagnosis, treatment, medications and side effects, urinary results, need for blood work, prognosis, and need for follow-up if no better or worse.   ASSESSMENT & PLAN: Lyrick was seen today for urinary frequency and back pain.  Diagnoses and all orders for this visit:  Urinary frequency -     POCT Microscopic Urinalysis ([facility]) -     POCT urinalysis dipstick -     POCT urine pregnancy -     sulfamethoxazole-trimethoprim (BACTRIM DS) 800-160 MG tablet; Take 1 tablet by mouth 2 (two) times daily for 7 days. -     Urine Culture  Clinical infection    Patient Instructions       If you have lab work done today you will be contacted with your lab results within the next 2 weeks.  If you have not heard from [place] then please contact us. The fastest way to get your results is to register for My Chart.   IF you received an x-ray today, you will receive an invoice from [facility]. Please contact [facility] at [phone] with questions or concerns regarding your invoice.   IF you received labwork today, you will receive an invoice from [place]. Please contact [facility] at [phone] with questions or concerns regarding your invoice.   Our billing staff will not be able to assist you with questions regarding bills from these companies.  You will be contacted with the lab results as soon as they are available. The fastest way to get your results is to activate your My Chart  account. Instructions are located on the last page of this paperwork. If you have not heard from [place] regarding the results in 2 weeks, please contact this office.     Infeccin urinaria en los adultos Urinary Tract Infection, Adult Una infeccin urinaria (IU) puede ocurrir en Clinical cytogeneticist de las vas [place]. Las vas urinarias incluyen lo  siguiente:  Los riones.  Los urteres.  La vejiga.  La uretra. Estos rganos fabrican, almacenan y eliminan el pis (orina) del cuerpo. Cules son las causas? La causa es la presencia de grmenes (bacterias) en la zona genital. Estos grmenes proliferan y causan hinchazn (inflamacin) de las vas urinarias. Qu incrementa el riesgo? Es ms probable que contraiga esta afeccin si:  Tiene colocado un tubo delgado y pequeo (catter) para drenar el pis.  Nopuede controlar la evacuacin de pis ni de materia fecal (incontinencia).  Es Art therapist y, adems: ? [place] estos mtodos para Therapist, occupational: ? Un medicamento que [employer] espermatozoides (espermicida). ? Un dispositivo que impide el paso de los espermatozoides (diafragma). ? Tieneniveles bajos de una hormona femenina (estrgeno). ? Est embarazada.  Tiene genes que [employer].  Es sexualmente activa.  Toma antibiticos.  Tiene dificultad para orinar debido a: ? Su prstata es ms grande de lo normal, si usted es hombre. ? Obstruccin en la parte del cuerpo que drena el pis de la vejiga (uretra). ? Clculo renal. ? Untrastorno nervioso que afecta la vejiga (vejiga neurgena). ? No bebe una cantidad suficiente de lquido. ? No hace pis con la frecuencia suficiente.  Tiene otras afecciones, como: ? Diabetes. ? Un sistema que combate las enfermedades (sistema inmunitario) debilitado. ? Anemia drepanoctica. ? Gota. ? Lesin en la columna vertebral. Cules son los signos o los sntomas? Los sntomas de esta afeccin incluyen:  Necesidad inmediata (urgente) de hacer pis.  Hacer pis con frecuencia.  Hacer poca cantidad de pis con mucha frecuencia.  Dolor o ardor al [employer].  Sangre en el pis.  Pis que huele mal o anormal.  Dificultad para hacer pis.  Pis turbio.  Lquido que sale de la vagina, si es [place].  Dolor en la barriga o en la parte baja de la espalda. Otros sntomas pueden incluir  los siguientes:  Vmitos.  No sentir deseos de comer.  Sentirse confundido (confuso).  Sentirse cansado y malhumorado (irritable).  Cristy Hilts.  Materia fecal lquida (diarrea). Cmo se trata? El tratamiento de esta afeccin puede incluir:  Antibiticos.  Otros medicamentos.  Beber una cantidad suficiente de agua. Sigue estas instrucciones en tu casa:  Medicamentos  [employer] de venta libre y los recetados solamente como se lo haya indicado el mdico.  Si le recetaron un antibitico, tmelo como se lo haya indicado el mdico. No deje de tomarlo aunque comience a sentirse mejor. Indicaciones generales  Asegrese de hacer lo siguiente: ? Haga pis hasta que la vejiga quede vaca. ? Nocontenga el pis durante mucho tiempo. ? Vace la vejiga despus de Clinical biochemist. ? Lmpiese de adelante hacia atrs despus de defecar, si es mujer. Use cada trozo de papel higinico solo una vez cuando se limpie.  Beba suficiente lquido como para Theatre manager la orina de color amarillo plido.  Concurra a todas las visitas de seguimiento como se lo haya indicado el mdico. Esto es importante. Comunquese con un mdico si:  No mejora despus de 1 o 2das de tratamiento.  Los sntomas desaparecen y Teacher, adult education. Solicite ayuda inmediatamente si:  Tiene un  dolor muy intenso en la espalda.  Tiene dolor muy intenso en la parte baja de la barriga.  Tener fiebre.  Tiene Higher education careers adviser (nuseas).  Tiene vmitos. Resumen  Una infeccin urinaria (IU) puede ocurrir en Clinical cytogeneticist de las vas [place].  Esta afeccin es causada por la presencia de grmenes en la zona genital.  Existen muchos factores de riesgo de sufrir una IU. Estos incluyen tener colocado un tubo delgado y pequeo para drenar el pis y no poder controlar cundo hace pis y materia fecal.  El tratamiento incluye antibiticos contra los grmenes.  Beba suficiente lquido como para  Theatre manager la orina de color amarillo plido. Esta informacin no tiene Marine scientist el consejo del mdico. Asegrese de hacerle al mdico cualquier pregunta que tenga. Document Released: 09/22/2009 Document Revised: 03/28/2018 Document Reviewed: 03/28/2018 Elsevier Patient Education  2020 Elsevier Inc.      Agustina Caroli, MD [facility]

## 2019-02-11 NOTE — Patient Instructions (Addendum)
If you have lab work done today you will be contacted with your lab results within the next 2 weeks.  If you have not heard from [place] then please contact us. The fastest way to get your results is to register for My Chart.   IF you received an x-ray today, you will receive an invoice from [facility]. Please contact [facility] at [phone] with questions or concerns regarding your invoice.   IF you received labwork today, you will receive an invoice from [place]. Please contact [facility] at [phone] with questions or concerns regarding your invoice.   Our billing staff will not be able to assist you with questions regarding bills from these companies.  You will be contacted with the lab results as soon as they are available. The fastest way to get your results is to activate your My Chart account. Instructions are located on the last page of this paperwork. If you have not heard from [place] regarding the results in 2 weeks, please contact this office.     Infeccin urinaria en los adultos Urinary Tract Infection, Adult Una infeccin urinaria (IU) puede ocurrir en Clinical cytogeneticist de las vas [place]. Las vas urinarias incluyen lo siguiente:  Los riones.  Los urteres.  La vejiga.  La uretra. Estos rganos fabrican, almacenan y eliminan el pis (orina) del cuerpo. Cules son las causas? La causa es la presencia de grmenes (bacterias) en la zona genital. Estos grmenes proliferan y causan hinchazn (inflamacin) de las vas urinarias. Qu incrementa el riesgo? Es ms probable que contraiga esta afeccin si:  Tiene colocado un tubo delgado y pequeo (catter) para drenar el pis.  Nopuede controlar la evacuacin de pis ni de materia fecal (incontinencia).  Es Art therapist y, adems: ? [place] estos mtodos para Therapist, occupational: ? Un medicamento que [employer] espermatozoides (espermicida). ? Un dispositivo que impide el paso de los espermatozoides  (diafragma). ? Tieneniveles bajos de una hormona femenina (estrgeno). ? Est embarazada.  Tiene genes que [employer].  Es sexualmente activa.  Toma antibiticos.  Tiene dificultad para orinar debido a: ? Su prstata es ms grande de lo normal, si usted es hombre. ? Obstruccin en la parte del cuerpo que drena el pis de la vejiga (uretra). ? Clculo renal. ? Untrastorno nervioso que afecta la vejiga (vejiga neurgena). ? No bebe una cantidad suficiente de lquido. ? No hace pis con la frecuencia suficiente.  Tiene otras afecciones, como: ? Diabetes. ? Un sistema que combate las enfermedades (sistema inmunitario) debilitado. ? Anemia drepanoctica. ? Gota. ? Lesin en la columna vertebral. Cules son los signos o los sntomas? Los sntomas de esta afeccin incluyen:  Necesidad inmediata (urgente) de hacer pis.  Hacer pis con frecuencia.  Hacer poca cantidad de pis con mucha frecuencia.  Dolor o ardor al [employer].  Sangre en el pis.  Pis que huele mal o anormal.  Dificultad para hacer pis.  Pis turbio.  Lquido que sale de la vagina, si es [place].  Dolor en la barriga o en la parte baja de la espalda. Otros sntomas pueden incluir los siguientes:  Vmitos.  No sentir deseos de comer.  Sentirse confundido (confuso).  Sentirse cansado y malhumorado (irritable).  Cristy Hilts.  Materia fecal lquida (diarrea). Cmo se trata? El tratamiento de esta afeccin puede incluir:  Antibiticos.  Otros medicamentos.  Beber una cantidad suficiente de agua. Sigue estas instrucciones en tu casa:  [employer] de venta libre y [place]  recetados solamente como se lo haya indicado el mdico.  Si le recetaron un antibitico, tmelo como se lo haya indicado el mdico. No deje de tomarlo aunque comience a sentirse mejor. Indicaciones generales  Asegrese de hacer lo siguiente: ? Haga pis hasta que la vejiga quede vaca. ? Nocontenga el  pis durante mucho tiempo. ? Vace la vejiga despus de Clinical biochemist. ? Lmpiese de adelante hacia atrs despus de defecar, si es mujer. Use cada trozo de papel higinico solo una vez cuando se limpie.  Beba suficiente lquido como para Theatre manager la orina de color amarillo plido.  Concurra a todas las visitas de seguimiento como se lo haya indicado el mdico. Esto es importante. Comunquese con un mdico si:  No mejora despus de 1 o 2das de tratamiento.  Los sntomas desaparecen y Teacher, adult education. Solicite ayuda inmediatamente si:  Tiene un dolor muy intenso en la espalda.  Tiene dolor muy intenso en la parte baja de la barriga.  Tener fiebre.  Tiene Higher education careers adviser (nuseas).  Tiene vmitos. Resumen  Una infeccin urinaria (IU) puede ocurrir en Clinical cytogeneticist de las vas [place].  Esta afeccin es causada por la presencia de grmenes en la zona genital.  Existen muchos factores de riesgo de sufrir una IU. Estos incluyen tener colocado un tubo delgado y pequeo para drenar el pis y no poder controlar cundo hace pis y materia fecal.  El tratamiento incluye antibiticos contra los grmenes.  Beba suficiente lquido como para Theatre manager la orina de color amarillo plido. Esta informacin no tiene Marine scientist el consejo del mdico. Asegrese de hacerle al mdico cualquier pregunta que tenga. Document Released: 09/22/2009 Document Revised: 03/28/2018 Document Reviewed: 03/28/2018 Elsevier Patient Education  2020 [employer].

## 2019-02-12 ENCOUNTER — Ambulatory Visit (INDEPENDENT_AMBULATORY_CARE_PROVIDER_SITE_OTHER): Payer: BC Managed Care – PPO | Admitting: Psychology

## 2019-02-12 ENCOUNTER — Telehealth: Payer: Self-pay | Admitting: Emergency Medicine

## 2019-02-12 DIAGNOSIS — F4323 Adjustment disorder with mixed anxiety and depressed mood: Secondary | ICD-10-CM

## 2019-02-12 LAB — CBC WITH DIFFERENTIAL/PLATELET
Basophils Absolute: 0 10*3/uL (ref 0.0–0.2)
Basos: 1 %
EOS (ABSOLUTE): 0 10*3/uL (ref 0.0–0.4)
Eos: 1 %
Hematocrit: 42.3 % (ref 34.0–46.6)
Hemoglobin: 14.5 g/dL (ref 11.1–15.9)
Immature Grans (Abs): 0 10*3/uL (ref 0.0–0.1)
Immature Granulocytes: 0 %
Lymphocytes Absolute: 1.8 10*3/uL (ref 0.7–3.1)
Lymphs: 47 %
MCH: 29.8 pg (ref 26.6–33.0)
MCHC: 34.3 g/dL (ref 31.5–35.7)
MCV: 87 fL (ref 79–97)
Monocytes Absolute: 0.2 10*3/uL (ref 0.1–0.9)
Monocytes: 5 %
Neutrophils Absolute: 1.8 10*3/uL (ref 1.4–7.0)
Neutrophils: 46 %
Platelets: 178 10*3/uL (ref 150–450)
RBC: 4.87 x10E6/uL (ref 3.77–5.28)
RDW: 12.4 % (ref 11.7–15.4)
WBC: 3.8 10*3/uL (ref 3.4–10.8)

## 2019-02-12 LAB — COMPREHENSIVE METABOLIC PANEL
ALT: 23 IU/L (ref 0–32)
AST: 31 IU/L (ref 0–40)
Albumin/Globulin Ratio: 1.4 (ref 1.2–2.2)
Albumin: 4.3 g/dL (ref 3.8–4.8)
Alkaline Phosphatase: 104 IU/L (ref 39–117)
BUN/Creatinine Ratio: 15 (ref 9–23)
BUN: 8 mg/dL (ref 6–24)
Bilirubin Total: <0.2 mg/dL (ref 0.0–1.2)
CO2: 21 mmol/L (ref 20–29)
Calcium: 8.7 mg/dL (ref 8.7–10.2)
Chloride: 107 mmol/L — ABNORMAL HIGH (ref 96–106)
Creatinine, Ser: 0.54 mg/dL — ABNORMAL LOW (ref 0.57–1.00)
GFR calc Af Amer: 130 mL/min/{1.73_m2} (ref >59)
GFR calc non Af Amer: 113 mL/min/{1.73_m2} (ref >59)
Globulin, Total: 3 g/dL (ref 1.5–4.5)
Glucose: 126 mg/dL — ABNORMAL HIGH (ref 65–99)
Potassium: 3.9 mmol/L (ref 3.5–5.2)
Sodium: 143 mmol/L (ref 134–144)
Total Protein: 7.3 g/dL (ref 6.0–8.5)

## 2019-02-12 NOTE — Telephone Encounter (Signed)
Blood results discussed with patient. 

## 2019-02-13 LAB — URINE CULTURE

## 2019-02-19 ENCOUNTER — Other Ambulatory Visit: Payer: Self-pay | Admitting: *Deleted

## 2019-02-19 DIAGNOSIS — N644 Mastodynia: Secondary | ICD-10-CM

## 2019-02-20 ENCOUNTER — Encounter: Payer: Self-pay | Admitting: Obstetrics & Gynecology

## 2019-02-20 ENCOUNTER — Ambulatory Visit: Payer: BC Managed Care – PPO | Admitting: Obstetrics & Gynecology

## 2019-02-20 ENCOUNTER — Other Ambulatory Visit: Payer: Self-pay

## 2019-02-20 VITALS — BP 136/88

## 2019-02-20 DIAGNOSIS — G44329 Chronic post-traumatic headache, not intractable: Secondary | ICD-10-CM | POA: Diagnosis not present

## 2019-02-20 DIAGNOSIS — N644 Mastodynia: Secondary | ICD-10-CM | POA: Diagnosis not present

## 2019-02-20 DIAGNOSIS — M542 Cervicalgia: Secondary | ICD-10-CM | POA: Diagnosis not present

## 2019-02-20 NOTE — Progress Notes (Signed)
    [place] 1970-12-10 [id number]        48 y.o.  [id number]   RP: Left breast and nipple tenderness on-off x several weeks  HPI:  Left breast and nipple tenderness on-off x several weeks.  Tenderness is at the upper external quadrant of the left breast and at the nipple.  Patient feels that the left nipple is a little different in position, but like that since breast feeding her daughter four yrs ago.  No nipple discharge.  No breast lump felt.  No axillary lump.  No h/o Breast Ca in her family.  Overdue for her Screening mammo.  Last screen mammo negative 07/2017.   OB History  Gravida Para Term Preterm AB Living  5 5 5     5   SAB TAB Ectopic Multiple Live Births        0 4    # Outcome Date GA Lbr Len/2nd Weight Sex Delivery Anes PTL Lv  5 Term 11/04/14 [redacted]w[redacted]d  7 lb 8.5 oz (3.416 kg) F Vag-Spont EPI  LIV  4 Term 03/18/08 [redacted]w[redacted]d    Vag-Spont  N LIV  3 Term 11/07/00 [redacted]w[redacted]d    Vag-Spont  N LIV  2 Term 07/09/90 [redacted]w[redacted]d    Vag-Spont  N LIV  1 Term 08/24/89 [redacted]w[redacted]d    Vag-Spont       Past medical history,surgical history, problem list, medications, allergies, family history and social history were all reviewed and documented in the EPIC chart.   Directed ROS with pertinent positives and negatives documented in the history of present illness/assessment and plan.  Exam:  Vitals:   02/20/19 1532  BP: 136/88   General appearance:  Normal  Breast exam:  Rt breast and Rt axilla normal                          Lt breast:  Mild tenderness at the upper external quadrant and at the nipple.  Mild retraction, no inversion of the nipple.  No nodule or mass felt.  Lt axilla normal.   Assessment/Plan:  48 y.o. [id number]   1. Pain of left breast  Lt breast:  Mild tenderness at the upper external quadrant and at the nipple.  Mild retraction, no inversion of the nipple.  No nodule or mass felt.  Lt axilla normal.  Will schedule a left diagnostic mammogram and ultrasound.  Will do a  screening mammogram on the right breast.  Counseling on above issues and coordination of care more than 50% for 25 minutes.  Princess Bruins MD, 4:00 PM 02/20/2019

## 2019-02-20 NOTE — Patient Instructions (Signed)
1. Pain of left breast  Lt breast:  Mild tenderness at the upper external quadrant and at the nipple.  Mild retraction, no inversion of the nipple.  No nodule or mass felt.  Lt axilla normal.  Will schedule a left diagnostic mammogram and ultrasound.  Will do a screening mammogram on the right breast.  Kim [name], it was a pleasure seeing you today!

## 2019-02-26 ENCOUNTER — Other Ambulatory Visit: Payer: Self-pay | Admitting: *Deleted

## 2019-02-26 ENCOUNTER — Ambulatory Visit (INDEPENDENT_AMBULATORY_CARE_PROVIDER_SITE_OTHER): Payer: BC Managed Care – PPO | Admitting: Psychology

## 2019-02-26 DIAGNOSIS — N644 Mastodynia: Secondary | ICD-10-CM

## 2019-02-26 DIAGNOSIS — F4323 Adjustment disorder with mixed anxiety and depressed mood: Secondary | ICD-10-CM

## 2019-02-26 NOTE — Progress Notes (Unsigned)
Dx mammo order with Left breast US order sent to [facility] due to left breast pain upper external Quad and nipple pain with mild retraction. ASked breast center to call patient to schedule. KW CMA  Pt informed to call the [facility] to schedule Dx mammo/US. KW CMA

## 2019-02-28 DIAGNOSIS — G5600 Carpal tunnel syndrome, unspecified upper limb: Secondary | ICD-10-CM | POA: Diagnosis not present

## 2019-03-12 ENCOUNTER — Ambulatory Visit (INDEPENDENT_AMBULATORY_CARE_PROVIDER_SITE_OTHER): Payer: BC Managed Care – PPO | Admitting: Psychology

## 2019-03-12 DIAGNOSIS — F330 Major depressive disorder, recurrent, mild: Secondary | ICD-10-CM

## 2019-03-22 ENCOUNTER — Other Ambulatory Visit: Payer: Self-pay | Admitting: Obstetrics & Gynecology

## 2019-03-22 DIAGNOSIS — N644 Mastodynia: Secondary | ICD-10-CM

## 2019-03-26 ENCOUNTER — Ambulatory Visit: Payer: Self-pay | Admitting: Psychology

## 2019-04-02 ENCOUNTER — Other Ambulatory Visit: Payer: Self-pay

## 2019-04-02 ENCOUNTER — Ambulatory Visit: Payer: BC Managed Care – PPO

## 2019-04-02 ENCOUNTER — Ambulatory Visit
Admission: RE | Admit: 2019-04-02 | Discharge: 2019-04-02 | Disposition: A | Discharge status: Home / Self Care | Payer: BC Managed Care – PPO | Source: Ambulatory Visit | Attending: Obstetrics & Gynecology | Admitting: Obstetrics & Gynecology

## 2019-04-02 DIAGNOSIS — N644 Mastodynia: Secondary | ICD-10-CM

## 2019-04-02 DIAGNOSIS — R928 Other abnormal and inconclusive findings on diagnostic imaging of breast: Secondary | ICD-10-CM | POA: Diagnosis not present

## 2019-04-03 DIAGNOSIS — G44329 Chronic post-traumatic headache, not intractable: Secondary | ICD-10-CM | POA: Diagnosis not present

## 2019-04-03 DIAGNOSIS — M542 Cervicalgia: Secondary | ICD-10-CM | POA: Diagnosis not present

## 2019-04-10 ENCOUNTER — Other Ambulatory Visit: Payer: Self-pay | Admitting: Obstetrics & Gynecology

## 2019-04-23 ENCOUNTER — Ambulatory Visit (INDEPENDENT_AMBULATORY_CARE_PROVIDER_SITE_OTHER): Payer: BC Managed Care – PPO | Admitting: Psychology

## 2019-04-23 DIAGNOSIS — F330 Major depressive disorder, recurrent, mild: Secondary | ICD-10-CM

## 2019-04-30 DIAGNOSIS — M546 Pain in thoracic spine: Secondary | ICD-10-CM | POA: Diagnosis not present

## 2019-04-30 DIAGNOSIS — M545 Low back pain: Secondary | ICD-10-CM | POA: Diagnosis not present

## 2019-04-30 DIAGNOSIS — M542 Cervicalgia: Secondary | ICD-10-CM | POA: Diagnosis not present

## 2019-04-30 DIAGNOSIS — R51 Headache with orthostatic component, not elsewhere classified: Secondary | ICD-10-CM | POA: Diagnosis not present

## 2019-05-07 ENCOUNTER — Ambulatory Visit (INDEPENDENT_AMBULATORY_CARE_PROVIDER_SITE_OTHER): Payer: BC Managed Care – PPO | Admitting: Psychology

## 2019-05-07 DIAGNOSIS — F330 Major depressive disorder, recurrent, mild: Secondary | ICD-10-CM | POA: Diagnosis not present

## 2019-05-08 DIAGNOSIS — H9313 Tinnitus, bilateral: Secondary | ICD-10-CM | POA: Diagnosis not present

## 2019-05-08 DIAGNOSIS — H6981 Other specified disorders of Eustachian tube, right ear: Secondary | ICD-10-CM | POA: Diagnosis not present

## 2019-05-08 DIAGNOSIS — R519 Headache, unspecified: Secondary | ICD-10-CM | POA: Diagnosis not present

## 2019-05-08 DIAGNOSIS — H903 Sensorineural hearing loss, bilateral: Secondary | ICD-10-CM | POA: Diagnosis not present

## 2019-05-09 DIAGNOSIS — M545 Low back pain: Secondary | ICD-10-CM | POA: Diagnosis not present

## 2019-05-09 DIAGNOSIS — R51 Headache with orthostatic component, not elsewhere classified: Secondary | ICD-10-CM | POA: Diagnosis not present

## 2019-05-09 DIAGNOSIS — M546 Pain in thoracic spine: Secondary | ICD-10-CM | POA: Diagnosis not present

## 2019-05-09 DIAGNOSIS — M542 Cervicalgia: Secondary | ICD-10-CM | POA: Diagnosis not present

## 2019-05-15 DIAGNOSIS — G44329 Chronic post-traumatic headache, not intractable: Secondary | ICD-10-CM | POA: Diagnosis not present

## 2019-05-15 DIAGNOSIS — M542 Cervicalgia: Secondary | ICD-10-CM | POA: Diagnosis not present

## 2019-05-16 DIAGNOSIS — M546 Pain in thoracic spine: Secondary | ICD-10-CM | POA: Diagnosis not present

## 2019-05-16 DIAGNOSIS — R51 Headache with orthostatic component, not elsewhere classified: Secondary | ICD-10-CM | POA: Diagnosis not present

## 2019-05-16 DIAGNOSIS — M545 Low back pain: Secondary | ICD-10-CM | POA: Diagnosis not present

## 2019-05-16 DIAGNOSIS — M542 Cervicalgia: Secondary | ICD-10-CM | POA: Diagnosis not present

## 2019-05-20 DIAGNOSIS — M545 Low back pain: Secondary | ICD-10-CM | POA: Diagnosis not present

## 2019-05-20 DIAGNOSIS — M546 Pain in thoracic spine: Secondary | ICD-10-CM | POA: Diagnosis not present

## 2019-05-20 DIAGNOSIS — R51 Headache with orthostatic component, not elsewhere classified: Secondary | ICD-10-CM | POA: Diagnosis not present

## 2019-05-20 DIAGNOSIS — M542 Cervicalgia: Secondary | ICD-10-CM | POA: Diagnosis not present

## 2019-05-21 ENCOUNTER — Ambulatory Visit (INDEPENDENT_AMBULATORY_CARE_PROVIDER_SITE_OTHER): Payer: BC Managed Care – PPO | Admitting: Psychology

## 2019-05-21 DIAGNOSIS — F320 Major depressive disorder, single episode, mild: Secondary | ICD-10-CM

## 2019-05-28 DIAGNOSIS — M545 Low back pain: Secondary | ICD-10-CM | POA: Diagnosis not present

## 2019-05-28 DIAGNOSIS — M542 Cervicalgia: Secondary | ICD-10-CM | POA: Diagnosis not present

## 2019-05-28 DIAGNOSIS — M546 Pain in thoracic spine: Secondary | ICD-10-CM | POA: Diagnosis not present

## 2019-05-28 DIAGNOSIS — R51 Headache with orthostatic component, not elsewhere classified: Secondary | ICD-10-CM | POA: Diagnosis not present

## 2019-06-04 DIAGNOSIS — H04129 Dry eye syndrome of unspecified lacrimal gland: Secondary | ICD-10-CM | POA: Diagnosis not present

## 2019-06-04 DIAGNOSIS — H5711 Ocular pain, right eye: Secondary | ICD-10-CM | POA: Diagnosis not present

## 2019-06-04 DIAGNOSIS — H538 Other visual disturbances: Secondary | ICD-10-CM | POA: Diagnosis not present

## 2019-06-04 DIAGNOSIS — H1011 Acute atopic conjunctivitis, right eye: Secondary | ICD-10-CM | POA: Diagnosis not present

## 2019-06-18 ENCOUNTER — Ambulatory Visit (INDEPENDENT_AMBULATORY_CARE_PROVIDER_SITE_OTHER): Payer: BC Managed Care – PPO | Admitting: Psychology

## 2019-06-18 DIAGNOSIS — F331 Major depressive disorder, recurrent, moderate: Secondary | ICD-10-CM

## 2019-06-19 DIAGNOSIS — H9313 Tinnitus, bilateral: Secondary | ICD-10-CM | POA: Diagnosis not present

## 2019-06-19 DIAGNOSIS — H6983 Other specified disorders of Eustachian tube, bilateral: Secondary | ICD-10-CM | POA: Diagnosis not present

## 2019-06-27 DIAGNOSIS — M542 Cervicalgia: Secondary | ICD-10-CM | POA: Diagnosis not present

## 2019-06-27 DIAGNOSIS — G44329 Chronic post-traumatic headache, not intractable: Secondary | ICD-10-CM | POA: Diagnosis not present

## 2019-07-16 ENCOUNTER — Ambulatory Visit (INDEPENDENT_AMBULATORY_CARE_PROVIDER_SITE_OTHER): Payer: BC Managed Care – PPO | Admitting: Psychology

## 2019-07-16 DIAGNOSIS — F331 Major depressive disorder, recurrent, moderate: Secondary | ICD-10-CM | POA: Diagnosis not present

## 2019-08-01 ENCOUNTER — Ambulatory Visit (INDEPENDENT_AMBULATORY_CARE_PROVIDER_SITE_OTHER): Payer: BC Managed Care – PPO | Admitting: Psychology

## 2019-08-01 DIAGNOSIS — F411 Generalized anxiety disorder: Secondary | ICD-10-CM | POA: Diagnosis not present

## 2019-08-07 DIAGNOSIS — M542 Cervicalgia: Secondary | ICD-10-CM | POA: Diagnosis not present

## 2019-08-07 DIAGNOSIS — G44329 Chronic post-traumatic headache, not intractable: Secondary | ICD-10-CM | POA: Diagnosis not present

## 2019-08-13 ENCOUNTER — Ambulatory Visit: Payer: Self-pay | Admitting: Psychology

## 2019-08-20 ENCOUNTER — Other Ambulatory Visit: Payer: Self-pay

## 2019-08-20 ENCOUNTER — Ambulatory Visit: Payer: BC Managed Care – PPO | Admitting: Adult Health Nurse Practitioner

## 2019-08-20 ENCOUNTER — Encounter: Payer: Self-pay | Admitting: Adult Health Nurse Practitioner

## 2019-08-20 VITALS — BP 136/84 | HR 63 | Temp 98.5°F | Ht 65.0 in | Wt 204.8 lb

## 2019-08-20 DIAGNOSIS — M25569 Pain in unspecified knee: Secondary | ICD-10-CM

## 2019-08-20 NOTE — Patient Instructions (Signed)
° ° ° °  If you have lab work done today you will be contacted with your lab results within the next 2 weeks.  If you have not heard from us then please contact us. The fastest way to get your results is to register for My Chart. ° ° °IF you received an x-ray today, you will receive an invoice from [facility]. Please contact [facility] at [phone] with questions or concerns regarding your invoice.  ° °IF you received labwork today, you will receive an invoice from [facility]. Please contact [facility] at [phone] with questions or concerns regarding your invoice.  ° °Our billing staff will not be able to assist you with questions regarding bills from these companies. ° °You will be contacted with the lab results as soon as they are available. The fastest way to get your results is to activate your My Chart account. Instructions are located on the last page of this paperwork. If you have not heard from us regarding the results in 2 weeks, please contact this office. °  ° ° ° °

## 2019-08-21 LAB — CBC WITH DIFFERENTIAL/PLATELET
Basophils Absolute: 0 10*3/uL (ref 0.0–0.2)
Basos: 1 %
EOS (ABSOLUTE): 0.2 10*3/uL (ref 0.0–0.4)
Eos: 3 %
Hematocrit: 40.5 % (ref 34.0–46.6)
Hemoglobin: 13.2 g/dL (ref 11.1–15.9)
Immature Grans (Abs): 0 10*3/uL (ref 0.0–0.1)
Immature Granulocytes: 1 %
Lymphocytes Absolute: 3.1 10*3/uL (ref 0.7–3.1)
Lymphs: 42 %
MCH: 29.6 pg (ref 26.6–33.0)
MCHC: 32.6 g/dL (ref 31.5–35.7)
MCV: 91 fL (ref 79–97)
Monocytes Absolute: 0.4 10*3/uL (ref 0.1–0.9)
Monocytes: 6 %
Neutrophils Absolute: 3.5 10*3/uL (ref 1.4–7.0)
Neutrophils: 47 %
Platelets: 262 10*3/uL (ref 150–450)
RBC: 4.46 x10E6/uL (ref 3.77–5.28)
RDW: 13 % (ref 11.7–15.4)
WBC: 7.3 10*3/uL (ref 3.4–10.8)

## 2019-08-21 LAB — D-DIMER, QUANTITATIVE: D-DIMER: 0.85 mg/L FEU — ABNORMAL HIGH (ref 0.00–0.49)

## 2019-08-27 ENCOUNTER — Ambulatory Visit (INDEPENDENT_AMBULATORY_CARE_PROVIDER_SITE_OTHER): Payer: BC Managed Care – PPO | Admitting: Psychology

## 2019-08-27 DIAGNOSIS — F411 Generalized anxiety disorder: Secondary | ICD-10-CM

## 2019-08-27 NOTE — Progress Notes (Signed)
fyi

## 2019-08-28 DIAGNOSIS — M25569 Pain in unspecified knee: Secondary | ICD-10-CM | POA: Insufficient documentation

## 2019-08-28 NOTE — Progress Notes (Signed)
Chief Complaint  Patient presents with  . Leg Pain    Pt stated that she got her COVId vacc on 08/15/2019 and that next day 08/16/2019 she started experiencing leg pain    HPI  This is a very frustrating visit with using a translator and talking to this patient.  The translator noted that he has not used this dialect and cannot really understand some of the things she is saying.   She does have a history of a blood clot, unknown time and unknown whether she was on anticoagulants.  She presents with lower extremity pain that she is blaming on her Covid vaccine.  She has lower extremity myalgia, arthralgia without any redness, swelling, tightness, warmth.  She is just having pain.  She would like to have a test to find out if she is having a blood clot and is having a hard time understanding that the test cannot be done in the office.   Problem List    Problem List: 2021-05: Arthralgia of lower leg 2019-09: Chest wall muscle strain 2019-09: History of UTI 2019-08: Chest wall contusion, right, initial encounter 2019-08: Back pain 2019-08: Acute lumbar myofascial strain 2019-08: Muscle spasm 2019-08: Musculoskeletal pain 2019-06: Insect bite of right ankle 2019-06: Skin infection 2019-01: Acute laryngitis 2018-10: Skin lesion of back 2018-07: Sore throat 2018-01: Chest pain 2017-11: Intramural leiomyoma of uterus 2017-10: Numbness and tingling 2017-10: Infection of nail bed of toe of right foot 2017-07: Itching of the skin (neck) 2017-07: GERD (gastroesophageal reflux disease) 2017-06: Mastodynia, female 2017-03: Acute upper back pain 2017-02: Dysuria 2017-02: Allergic rhinitis 2017-02: Flank pain 2016-11: History of pulmonary embolus (PE) 99991111: Biliary colic 0000000: Need for immunization against influenza 2016-09: Symptomatic cholelithiasis 2016-09: Post partum depression 2016-09: Dizziness 2016-08: Pain, pelvic, female 2016-08: Sciatica of right side 2016-07: Headache  2016-07: PE (pulmonary embolism) 2016-07: Gestational diabetes 2016-07: Acute UTI 2016-07: Hx of pulmonary embolus during pregnancy 2016-07: Normal labor 2016-07: NVD (normal vaginal delivery) 2016-03: Preexisting diabetes complicating pregnancy, antepartum 123456: Obesity complicating pregnancy 123456: Supervision of normal pregnancy 2016-01: Advanced maternal age in multigravida 2013-07: Right knee pain 2013-07: Obesity 2013-07: History of abnormal Pap smear [redacted] weeks gestation of pregnancy AMA (advanced maternal age) multigravida 7+ Encounter for fetal anatomic survey [redacted] weeks gestation of pregnancy [redacted] weeks gestation of pregnancy Maternal age 62+, multigravida, antepartum Pre-existing diabetes mellitus during pregnancy in third trimester [redacted] weeks gestation of pregnancy AMA (advanced maternal age) multigravida 35+ GDM (gestational diabetes mellitus) [redacted] weeks gestation of pregnancy Type 2 diabetes mellitus affecting pregnancy in third trimester,  antepartum [redacted] weeks gestation of pregnancy [redacted] weeks gestation of pregnancy Pre-existing type 2 diabetes mellitus during pregnancy in third  trimester [redacted] weeks gestation of pregnancy Hx of gestational diabetes mellitus, not currently pregnant [redacted] weeks gestation of pregnancy Pre-existing diabetes mellitus in pregnancy in third trimester Cancer ([place])   Allergies   is allergic to latex.  Medications    Current Outpatient Medications:  .  ibuprofen (ADVIL,MOTRIN) 200 MG tablet, Take 200 mg by mouth every 6 (six) hours as needed for mild pain., Disp: , Rfl:  .  zonisamide (ZONEGRAN) 50 MG capsule, Take 50 mg by mouth daily., Disp: , Rfl:    Review of Systems    Constitutional: Negative for activity change, appetite change, chills and fever.  HENT: Negative for congestion, nosebleeds, trouble swallowing and voice change.   Respiratory: Negative for cough, shortness of breath and wheezing.   Cardiac:  Negative for chest pain,  pressure, syncope  Gastrointestinal: Negative for diarrhea, nausea and vomiting.  Genitourinary: Negative for difficulty urinating, dysuria, flank pain and hematuria.  Musculoskeletal: Negative for back pain, joint swelling and neck pain.  Neurological: Negative for dizziness, speech difficulty, light-headedness and numbness.  See HPI. All other review of systems negative.     Physical Exam:    height is 5\' 5"  (1.651 m) and weight is 204 lb 12.8 oz (92.9 kg). Her temporal temperature is 98.5 F (36.9 C). Her blood pressure is 136/84 and her pulse is 63. Her oxygen saturation is 99%.   Physical Examination: General appearance - alert, well appearing, and in no distress and oriented to person, place, and time Mental status - normal mood, behavior, speech, dress, motor activity, and thought processes Eyes - PERRL. Extraocular movements intact.  No nystagmus.  Neck - supple, no significant adenopathy, carotids upstroke normal bilaterally, no bruits, thyroid exam: thyroid is normal in size without nodules or tenderness Chest - clear to auscultation, no wheezes, rales or rhonchi, symmetric air entry  Heart - normal rate, regular rhythm, normal S1, S2, no murmurs, rubs, clicks or gallops Extremities - dependent LE edema without clubbing or cyanosis Skin - normal coloration and turgor, no rashes, no suspicious skin lesions noted  No hyperpigmentation of skin.  No current hematomas noted   Lab /Imaging Review      Assessment & Plan:  [facility] [name] is a 49 y.o. female    1. Arthralgia of lower leg, unspecified laterality    Orders Placed This Encounter  Procedures  . D-dimer, quantitative (not at [facility])  . CBC with Differential/Platelet  . VAS [place] LOWER EXTREMITY VENOUS (DVT)   No orders of the defined types were placed in this encounter.  I communicated with the translator and the patient that I would order an ultrasound of her lower extremity and check to see if a D-dimer was  elevated.  From her exam, i appreciate no warmth, redness, swelling or any concern of a blood clot but will check per patient's insistence.  Glyn Ade, NP

## 2019-09-02 ENCOUNTER — Ambulatory Visit: Payer: BC Managed Care – PPO | Admitting: Internal Medicine

## 2019-09-02 ENCOUNTER — Encounter: Payer: Self-pay | Admitting: Internal Medicine

## 2019-09-02 VITALS — BP 123/89 | HR 78 | Temp 98.0°F | Ht 65.0 in | Wt 202.3 lb

## 2019-09-02 DIAGNOSIS — Z6835 Body mass index (BMI) 35.0-35.9, adult: Secondary | ICD-10-CM

## 2019-09-02 DIAGNOSIS — M25569 Pain in unspecified knee: Secondary | ICD-10-CM | POA: Diagnosis not present

## 2019-09-02 DIAGNOSIS — R7309 Other abnormal glucose: Secondary | ICD-10-CM

## 2019-09-02 DIAGNOSIS — M79604 Pain in right leg: Secondary | ICD-10-CM | POA: Diagnosis not present

## 2019-09-02 DIAGNOSIS — M79605 Pain in left leg: Secondary | ICD-10-CM

## 2019-09-02 DIAGNOSIS — M7918 Myalgia, other site: Secondary | ICD-10-CM

## 2019-09-02 NOTE — Progress Notes (Addendum)
CC: Leg pain  HPI: Kim [name] is a 49 y.o. with PMH listed below presenting with complaint of leg pain. Please see problem based assessment and plan for further details.  Surgical History Cervical laser biopsy  Social History: Lives with family (5 people household). Currently unemployed. Occasionally drinks alcohol (weekends). Denies tobacco use or illicit substance use  Family History: Denies any familiar hx of thrombosis or bleeding.  Family History  Problem Relation Age of Onset   Diabetes Mother    Hypertension Mother    Hyperlipidemia Father    Diabetes Father    Hypertension Father    Heart disease Sister    Diabetes Brother    Hyperlipidemia Brother    Drug abuse Paternal Grandmother    Kidney disease Other    Birth defects Other        anal atresia/stenosis   Past Medical History:  Diagnosis Date   Abnormal Pap smear    patient states she had cancer that was removed from her cervix   Asthma    when pregnant/ once 6 yrs ago only time asthma attack the dr said   Cancer [facility])    Clotting disorder ([place])    Gestational diabetes    Intramural leiomyoma of uterus 02/29/2016   Pulmonary embolism ([place])    Renal insufficiency    Vitamin D deficiency    Vitamin D deficiency    Review of Systems: Review of Systems  Constitutional: Negative for chills, fever and malaise/fatigue.  Eyes: Negative for blurred vision.  Respiratory: Negative for cough, hemoptysis and wheezing.   Cardiovascular: Negative for chest pain, palpitations and leg swelling.  Gastrointestinal: Negative for constipation, diarrhea, nausea and vomiting.  Musculoskeletal: Positive for back pain, joint pain and myalgias. Negative for falls.  Neurological: Negative for dizziness and headaches.  All other systems reviewed and are negative.    Physical Exam: Vitals:   09/02/19 1510  BP: 123/89  Pulse: 78  Temp: 98 F (36.7 C)  TempSrc: Oral  SpO2: 98%    Weight: 202 lb 4.8 oz (91.8 kg)  Height: 5\' 5"  (1.651 m)   Physical Exam  Constitutional: She is oriented to person, place, and time. She appears well-developed and well-nourished. No distress.  Cardiovascular: Normal rate, regular rhythm, normal heart sounds and intact distal pulses.  No murmur heard. Respiratory: Effort normal and breath sounds normal. She has no wheezes. She has no rales.  GI: Soft. Bowel sounds are normal. She exhibits no distension. There is no abdominal tenderness.  Musculoskeletal:        General: Tenderness (Diffuse tenderness of bilateral lower extremities) present. No edema (equal diameter of bilateral calves and thighs ). Normal range of motion.  Neurological: She is alert and oriented to person, place, and time.  Skin: Skin is warm and dry.     Assessment & Plan:   Myalgia, other site Kim [name] Generous is a 49 yo F w/ PMH of Fibroid, GERD presenting with complaints of bilateral proximal lower extremity pain. Kim [name] is Spanish speaking and is evaluated with her sister present who acts as Optometrist per patient's request. She states that she was in her usual state of health until about a month ago when she received her second Moderna vaccine. Since then, she has been experiencing diffuse muscle and joint aches in her back and thighs. She mentions that the pain is constant and despite being a month since her COVID vaccine, she continues to experience pain. She mentions  being seen at [facility] urgent care couple weeks ago and was screened for possible blood clot due to her prior history of blood clots and was told to get ultrasound of her lower extremities which she has not yet gotten done. She denies any long car rides. She does travel to [place] at times which is about 2 hr flight. She denies any surgeries. She mentions prior hx of cervical ca 20 years ago which was treated with biopsy. She denies any family hx of bleeding or clotting disorders. Mentions good  adherence to contraceptive use with condoms. Denies any new medication changes.  A/P Kim [name] presents to [facility] to re-establish care. Currently experiencing bilateral lower extremity pain of unclear etiology. Work-up for DVT started at Urgent Care with elevated D-dimer. Wells score of 1 due to prior hx of provoked DVT/PE while pregnant requiring 3 months of anticoagulation therapy. Ultrasound ordered but not yet performed. Other differential for pain includes auto-immune vs inflammatory myositis, tendonitis, or fibromyalgia. Physical exam only shows tenderness to palpation.  - CK, CMP - F/u lower extremity ultrasound - Can use OTC tylneol or NSAIDs based on results of above blood test  Other abnormal glucose Lab Results  Component Value Date   HGBA1C 5.3 06/12/2017   Chart review reveals prior random glucose elevated at 126. Unclear if fasting. Previous screening for hgb a1c negative for diabetes at 5.3 but checked 2 years ago. Patient's blood drawn prior to adding on screening hgb a1c. Attmpted to add-on hgb a1c to drawn labs but unable to do so due to different vial requirement.  - Screen for diabetes at next visit    Patient discussed with Dr. Evette Doffing   -Gilberto Better, [facility] Pager: [phone]

## 2019-09-02 NOTE — Patient Instructions (Addendum)
Dear [facility],  Thank you for allowing [place] to provide your care today. Today we discussed your leg pain    I have ordered cmp, ck labs for you. I will call if any are abnormal.    Today we made the following changes to your medications:    Please start ibuprofen 600mg  daily  Please follow-up in 2 weeks .    Should you have any questions or concerns please call the internal medicine clinic at [phone].    Thank you for choosing [facility].   Trombosis venosa profunda Deep Vein Thrombosis  La trombosis venosa profunda (TVP) es una afeccin en la que se forma un cogulo de sangre en una vena profunda, como una vena de la parte inferior de la pierna, del muslo o del brazo. Un cogulo es sangre que se ha espesado y convertido en gel o se ha solidificado. Esta afeccin es peligrosa. Puede causar complicaciones graves e incluso potencialmente mortales si el cogulo llega hasta los pulmones y causa una obstruccin (embolia pulmonar). Adems, puede daar las venas de la pierna. Puede causar dolor, hinchazn, manchas y llagas en la pierna (sndrome postrombtico). Cules son las causas? Esta afeccin puede ser causada por lo siguiente:  Ardelia Mems ralentizacin de la circulacin sangunea.  Dao en una vena.  Una afeccin que hace que la sangre se coagule ms fcilmente, como un trastorno de Barrister's clerk. Qu incrementa el riesgo? Los siguientes factores pueden hacer que usted sea ms propenso a tener esta afeccin:  Tener sobrepeso.  Ser Ardelia Mems persona mayor de edad, en especial mayor de 60aos.  Sentarse o acostarse durante ms de cuatro horas.  Estar en el hospital.  Falta de actividad fsica (estilo de vida sedentario).  Glennis Brink, parto o haber dado a luz recientemente.  Tomar medicamentos que contienen estrgeno, como los medicamentos para Patent attorney.  Fumar.  Antecedentes de cualquiera de los siguientes: ? Cogulos de sangre o una  enfermedad de coagulacin de Herbalist. ? Enfermedad vascular perifrica. ? Enfermedad inflamatoria del intestino. ? Cncer. ? Enfermedad cardaca. ? Trastornos genticos que afectan la coagulacin de la sangre, tales como la mutacin del factor V Leiden. ? Enfermedades neurolgicas que afectan las piernas (paresia de piernas). ? Una lesin reciente, como un accidente automovilstico. ? Ciruga mayor o de Health and safety inspector. ? Una va central colocada en una vena grande. Cules son los signos o los sntomas? Los sntomas de esta afeccin [employer] siguientes:  Leslie, dolor o sensibilidad en un brazo o una pierna.  Calor, enrojecimiento o manchas en un brazo o una pierna. Si el cogulo est ubicado en la pierna, los sntomas pueden ser ms notorios o empeorar al pararse o Writer. Algunas personas no presentan sntomas. Cmo se diagnostica? Esta afeccin se diagnostica mediante lo siguiente:  Un examen fsico y los antecedentes mdicos.  Estudios, como por ejemplo: ? Anlisis de [place]. Se realizan para determinar qu tan bien coagula la sangre. ? Ecografa. Se realiza para detectar la presencia de cogulos. ? Venograma. Para esta prueba, se inyecta un tinte de contraste en una vena y se toman radiografas para verificar si hay cogulos. Cmo se trata? El tratamiento de esta afeccin depende de lo siguiente:  La causa de su TVP.  Su riesgo de tener una hemorragia y tener ms cogulos.  Otras afecciones que padezca. El tratamiento puede incluir lo siguiente:  Tomar un diluyente sanguneo (anticoagulante). Este tipo de medicamento evita la formacin de cogulos. Se pueden administrar por va  oral, con una inyeccin debajo de la piel o a travs de una va intravenosa (catter).  Inyeccin de medicamentos para disolver los cogulos en la vena afectada (tromblisis dirigida por catter).  Someterse a [place]. Se puede realizar Clementeen Hoof para lo siguiente: ? Corporate investment banker. ? Colocar un filtro en una vena grande para capturar los cogulos de sangre antes de que lleguen a los pulmones. Es posible que algunos tratamientos deban continuarse hasta por seis meses. Siga estas indicaciones en su casa: Si toma anticoagulantes, tenga en cuenta lo siguiente:  Tmelos exactamente como se lo haya indicado el mdico. Algunos anticoagulantes deben tomarse a la [employer]. No se saltee una dosis.  Hable con su mdico antes de tomar cualquier medicamento que contenga aspirina o antiinflamatorios no esteroides (AINE). Estos medicamentos aumentan el riesgo de tener una hemorragia peligrosa.  Pregntele a su mdico sobre los alimentos y medicamentos que pueden cambiar la forma en que funciona el medicamento (pueden Counselling psychologist). Evtelos si el mdico se lo indica.  Los anticoagulantes pueden causar hematomas fcilmente y hacer que dejar de sangrar sea ms difcil. Por este motivo: ? Tenga sumo cuidado al usar cuchillos, tijeras u otros objetos filosos. ? Afitese con Donnal Debar elctrica en lugar de hacerlo con una hoja de afeitar. ? Evite actividades que podran causar lesiones o moretones y [place] cadas.  Use un brazalete de alerta mdica o lleve una tarjeta que muestre qu medicamentos toma. Instrucciones generales  Delphi de venta libre y los recetados solamente como se lo haya indicado el mdico.  Reanude sus actividades normales como se lo haya indicado el mdico. Pregntele al mdico qu actividades son seguras para usted.  Use medias de compresin si se lo recomienda el mdico.  Concurra a todas las visitas de control como se lo haya indicado el mdico. Esto es importante. Cmo se evita? Para disminuir el riesgo de volver a sufrir esta afeccin:  Durante 30 minutos o ms [employer], realice una actividad que: ? Conseco y las piernas. ? Aumente su  frecuencia cardaca.  Cuando viaje durante ms de cuatro horas: ? Engineer, mining con los brazos y las piernas cada una hora. ? Beba abundante agua. ? Evite el consumo de alcohol.  Evite permanecer sentado o parado durante perodos prolongados sin mover las piernas.  Si se somete a una ciruga o est hospitalizado, pregunte cmo evitar los cogulos de [place]. Esto puede incluir caminar frecuentemente o tomar anticoagulantes.  Mantenga un peso saludable.  Si es [employer] Corporation mayor de 35aos, evite el uso innecesario de medicamentos que contengan estrgeno, como las pldoras anticonceptivas.  No consuma ningn producto que contenga nicotina o tabaco, como cigarrillos y Psychologist, sport and exercise. Esto es de especial importancia si toma medicamentos con estrgeno. Si necesita ayuda para dejar de fumar, consulte al mdico. Comunquese con un mdico si:  Se saltea una dosis del anticoagulante.  Su perodo menstrual es ms abundante que lo normal.  Tiene hematomas fuera de lo comn. Solicite ayuda de inmediato si:  Tiene los siguientes sntomas: ? Social research officer, government, hinchazn o enrojecimiento nuevos en un brazo o una pierna, o un aumento de alguno de estos sntomas. ? Entumecimiento u hormigueo en un brazo o una pierna. ? Falta de aire. ? Tourist information centre manager. ? Latidos cardacos irregulares o rpidos. ? Dolor de cabeza intenso o confusin. ? Un corte que no deja de sangrar.  Newman Pies  sangre en el vmito, las heces o la [place].  Sufre una cada o un accidente grave o se golpea la cabeza.  Se siente mareado o siente que va a desvanecerse.  Tose y escupe sangre. Estos sntomas pueden representar un problema grave que constituye Engineer, maintenance (IT). No espere hasta que los sntomas desaparezcan. Solicite atencin mdica de inmediato. Comunquese con el servicio de emergencias de su localidad (911 en los [place]). No conduzca por sus propios medios [employer] hospital. Resumen  La trombosis venosa  profunda (TVP) es una afeccin en la que se forma un cogulo de sangre en una vena profunda, como una vena de la parte inferior de la pierna, del muslo o del brazo.  Los sntomas pueden incluir hinchazn, Freight forwarder, Social research officer, government, enrojecimiento en el brazo o la pierna.  Esta afeccin puede tratarse con un diluyente de la sangre (anticoagulante), un medicamento que se inyecta para [employer] Corporation cogulos de sangre,medias de compresin o [place].  Si le recetan anticoagulantes, tmelos exactamente como se lo hayan indicado. Esta informacin no tiene Marine scientist el consejo del mdico. Asegrese de hacerle al mdico cualquier pregunta que tenga. Document Revised: 10/18/2016 Document Reviewed: 10/18/2016 Elsevier Patient Education  [place].

## 2019-09-03 ENCOUNTER — Encounter: Payer: Self-pay | Admitting: Internal Medicine

## 2019-09-03 ENCOUNTER — Telehealth: Payer: Self-pay | Admitting: Adult Health Nurse Practitioner

## 2019-09-03 DIAGNOSIS — R7309 Other abnormal glucose: Secondary | ICD-10-CM | POA: Insufficient documentation

## 2019-09-03 DIAGNOSIS — M25569 Pain in unspecified knee: Secondary | ICD-10-CM

## 2019-09-03 LAB — CMP14 + ANION GAP
ALT: 12 IU/L (ref 0–32)
AST: 18 IU/L (ref 0–40)
Albumin/Globulin Ratio: 1.3 (ref 1.2–2.2)
Albumin: 4.1 g/dL (ref 3.8–4.8)
Alkaline Phosphatase: 106 IU/L (ref 48–121)
Anion Gap: 18 mmol/L (ref 10.0–18.0)
BUN/Creatinine Ratio: 27 — ABNORMAL HIGH (ref 9–23)
BUN: 13 mg/dL (ref 6–24)
Bilirubin Total: 0.3 mg/dL (ref 0.0–1.2)
CO2: 21 mmol/L (ref 20–29)
Calcium: 9.2 mg/dL (ref 8.7–10.2)
Chloride: 99 mmol/L (ref 96–106)
Creatinine, Ser: 0.49 mg/dL — ABNORMAL LOW (ref 0.57–1.00)
GFR calc Af Amer: 133 mL/min/{1.73_m2} (ref >59)
GFR calc non Af Amer: 116 mL/min/{1.73_m2} (ref >59)
Globulin, Total: 3.1 g/dL (ref 1.5–4.5)
Glucose: 92 mg/dL (ref 65–99)
Potassium: 4.1 mmol/L (ref 3.5–5.2)
Sodium: 138 mmol/L (ref 134–144)
Total Protein: 7.2 g/dL (ref 6.0–8.5)

## 2019-09-03 LAB — CK: Total CK: 86 U/L (ref 32–182)

## 2019-09-03 NOTE — Telephone Encounter (Signed)
[facility] called needs a new order in epic . Provider wrote as vascular ultra sound . They cannot use . Needs to be changed to LFT ,RGT or bilateral.  Pt called to schedule appt . They where not ablle  because of how ordered  Please change or send new order    Spoke with Prentiss Bells at [phone]

## 2019-09-03 NOTE — Assessment & Plan Note (Addendum)
Kim [name] is a 49 yo F w/ PMH of Fibroid, GERD presenting with complaints of bilateral proximal lower extremity pain. Kim [name] is Spanish speaking and is evaluated with her sister present who acts as Optometrist per patient's request. She states that she was in her usual state of health until about a month ago when she received her second Moderna vaccine. Since then, she has been experiencing diffuse muscle and joint aches in her back and thighs. She mentions that the pain is constant and despite being a month since her COVID vaccine, she continues to experience pain. She mentions being seen at [facility] urgent care couple weeks ago and was screened for possible blood clot due to her prior history of blood clots and was told to get ultrasound of her lower extremities which she has not yet gotten done. She denies any long car rides. She does travel to [place] at times which is about 2 hr flight. She denies any surgeries. She mentions prior hx of cervical ca 20 years ago which was treated with biopsy. She denies any family hx of bleeding or clotting disorders. Mentions good adherence to contraceptive use with condoms. Denies any new medication changes.  A/P Kim [name] presents to [facility] to re-establish care. Currently experiencing bilateral lower extremity pain of unclear etiology. Work-up for DVT started at Urgent Care with elevated D-dimer. Wells score of 1 due to prior hx of provoked DVT/PE while pregnant requiring 3 months of anticoagulation therapy. Ultrasound ordered but not yet performed. Other differential for pain includes auto-immune vs inflammatory myositis, tendonitis, or fibromyalgia. Physical exam only shows tenderness to palpation.  - CK, CMP - F/u lower extremity ultrasound - Can use OTC tylneol or NSAIDs based on results of above blood test

## 2019-09-03 NOTE — Telephone Encounter (Signed)
I know this is not your pt but could you switch this or advise what to do.

## 2019-09-03 NOTE — Assessment & Plan Note (Signed)
Lab Results  Component Value Date   HGBA1C 5.3 06/12/2017   Chart review reveals prior random glucose elevated at 126. Unclear if fasting. Previous screening for hgb a1c negative for diabetes at 5.3 but checked 2 years ago. Patient's blood drawn prior to adding on screening hgb a1c. Attmpted to add-on hgb a1c to drawn labs but unable to do so due to different vial requirement.  - Screen for diabetes at next visit

## 2019-09-04 NOTE — Progress Notes (Signed)
Internal Medicine Clinic Attending  Case discussed with Dr. Chundi at the time of the visit.  We reviewed the resident's history and exam and pertinent patient test results.  I agree with the assessment, diagnosis, and plan of care documented in the resident's note. 

## 2019-09-05 NOTE — Telephone Encounter (Signed)
Patient is calling back again.Kim [name] it is urgent that we call her and let her know when and where she is to go  . Patient is still in pain   .   Spoke with her son   Please reach out to patient

## 2019-09-06 ENCOUNTER — Other Ambulatory Visit: Payer: Self-pay | Admitting: Registered Nurse

## 2019-09-06 DIAGNOSIS — M25569 Pain in unspecified knee: Secondary | ICD-10-CM

## 2019-09-06 NOTE — Telephone Encounter (Signed)
LVM on the home # to let the pt know that I spoke with Orland Mustard and he said that he has placed a STAT referral for the pt so now she just has to wait til they give her a call to get her in.

## 2019-09-06 NOTE — Telephone Encounter (Signed)
I have spoke to a provider to correct the order that has been placed.   I have then called [facility] and other imaging offices with no luck in getting the pt scheduled for today.   I have informed the pt via an [place] interpreter the information above and gave her the number to [facility] to see if they have any cancellations. If not she was instructed to go to the ED either [facility] or [facility] to get the STAT US Bilateral done.   She stated understanding and was upset that it is taking a while with communication.   Thanks

## 2019-09-12 ENCOUNTER — Other Ambulatory Visit: Payer: Self-pay | Admitting: Internal Medicine

## 2019-09-12 ENCOUNTER — Ambulatory Visit (HOSPITAL_COMMUNITY)
Admission: RE | Admit: 2019-09-12 | Discharge: 2019-09-12 | Disposition: A | Discharge status: Home / Self Care | Payer: BC Managed Care – PPO | Source: Ambulatory Visit | Attending: Internal Medicine | Admitting: Internal Medicine

## 2019-09-12 ENCOUNTER — Other Ambulatory Visit: Payer: Self-pay

## 2019-09-12 DIAGNOSIS — M25561 Pain in right knee: Secondary | ICD-10-CM

## 2019-09-12 DIAGNOSIS — Z8759 Personal history of other complications of pregnancy, childbirth and the puerperium: Secondary | ICD-10-CM | POA: Diagnosis not present

## 2019-09-12 DIAGNOSIS — Z86711 Personal history of pulmonary embolism: Secondary | ICD-10-CM | POA: Diagnosis not present

## 2019-09-12 DIAGNOSIS — M25562 Pain in left knee: Secondary | ICD-10-CM

## 2019-09-12 NOTE — Progress Notes (Signed)
VASCULAR LAB PRELIMINARY  PRELIMINARY  PRELIMINARY  PRELIMINARY  Bilateral lower extremity venous duplex completed.    Preliminary report:  See CV proc for preliminary results.  Paged [phone] with no return call  Mase Dhondt, RVT 09/12/2019, 11:43 AM

## 2019-09-17 ENCOUNTER — Ambulatory Visit: Payer: Self-pay | Admitting: Psychology

## 2019-09-19 DIAGNOSIS — M542 Cervicalgia: Secondary | ICD-10-CM | POA: Diagnosis not present

## 2019-09-19 DIAGNOSIS — G44329 Chronic post-traumatic headache, not intractable: Secondary | ICD-10-CM | POA: Diagnosis not present

## 2019-09-24 ENCOUNTER — Encounter: Payer: Self-pay | Admitting: Internal Medicine

## 2019-09-24 ENCOUNTER — Ambulatory Visit: Payer: BC Managed Care – PPO | Admitting: Internal Medicine

## 2019-09-24 VITALS — BP 117/67 | HR 74 | Temp 98.0°F | Ht 65.0 in | Wt 200.4 lb

## 2019-09-24 DIAGNOSIS — R071 Chest pain on breathing: Secondary | ICD-10-CM | POA: Diagnosis not present

## 2019-09-24 DIAGNOSIS — R7309 Other abnormal glucose: Secondary | ICD-10-CM | POA: Diagnosis not present

## 2019-09-24 DIAGNOSIS — M7918 Myalgia, other site: Secondary | ICD-10-CM

## 2019-09-24 LAB — GLUCOSE, CAPILLARY: Glucose-Capillary: 102 mg/dL — ABNORMAL HIGH (ref 70–99)

## 2019-09-24 LAB — POCT GLYCOSYLATED HEMOGLOBIN (HGB A1C): Hemoglobin A1C: 5.2 % (ref 4.0–5.6)

## 2019-09-24 MED ORDER — MELOXICAM 7.5 MG PO TABS: 7.5000 mg | ORAL_TABLET | Freq: Every day | ORAL | 2 refills | Status: DC

## 2019-09-24 NOTE — Progress Notes (Signed)
CC: Chest pain  HPI: [name] [name] is a 49 y.o. with PMH listed below presenting with complaint of chest pain. Please see problem based assessment and plan for further details.  Past Medical History:  Diagnosis Date  . Abnormal Pap smear    patient states she had cancer that was removed from her cervix  . Asthma    when pregnant/ once 6 yrs ago only time asthma attack the dr said  . Cancer ([place])   . Clotting disorder ([place])   . Gestational diabetes   . Intramural leiomyoma of uterus 02/29/2016  . Pulmonary embolism ([place])   . Renal insufficiency   . Vitamin D deficiency   . Vitamin D deficiency    Review of Systems: Review of Systems  Constitutional: Negative for chills, fever and malaise/fatigue.  Eyes: Negative for blurred vision and double vision.  Respiratory: Negative for cough, sputum production and shortness of breath.   Cardiovascular: Positive for palpitations. Negative for chest pain and leg swelling.  Gastrointestinal: Negative for constipation, diarrhea, nausea and vomiting.  Neurological: Negative for dizziness and headaches.    Physical Exam: Vitals:   09/24/19 1450  BP: 117/67  Pulse: 74  Temp: 98 F (36.7 C)  TempSrc: Oral  SpO2: 98%  Weight: 200 lb 6.4 oz (90.9 kg)  Height: 5\' 5"  (1.651 m)   Physical Exam  Constitutional: She is oriented to person, place, and time. She appears well-developed and well-nourished. She appears distressed (Appear uncomfortable and in pain).  HENT:  Mouth/Throat: Oropharynx is clear and moist.  Eyes: Conjunctivae are normal.  Neck: No JVD present.  Cardiovascular: Normal rate, regular rhythm, normal heart sounds and intact distal pulses.  No murmur heard. Respiratory: Effort normal and breath sounds normal. She has no wheezes. She has no rales. She exhibits no tenderness (No chest wall tenderness to palpation).  GI: Soft. Bowel sounds are normal. She exhibits no distension. There is no abdominal tenderness.   Musculoskeletal:        General: No edema. Normal range of motion.     Cervical back: Normal range of motion and neck supple.  Neurological: She is alert and oriented to person, place, and time.  Skin: Skin is warm and dry.    Assessment & Plan:   Other abnormal glucose Noted to have random glucose elevated at 126 during prior labs. Noted to have previous hemoglobin a1c of 5.3 in 2019. Denies any polyuria, polydipsia, polyphagia.  - Check hgb a1c  Addendum: Hgb a1c 5.2, does not have diabetes  Chest pain on breathing Ms.[name] Tennis Must [name] is a 49 yo F w/ PMH of provoked PE presenting w/ complaint of chest pain. She is Spanish speaking and she is examined and evaluated with family member acting as interpreter per patient request. She presents for follow up for her myalgia. She recounts how about a month ago she was in her usual state of health until she had gradual onset stabbing pain in various areas of her body, including her legs. She was worked up for possible DVT w/ elevated d-dimer at an Urgent Care center at the time. Since then, she mentions that her pain appears to be now located in her chest. She mentions having gradual worsening severity with worsening pleuritic pain with inspiration, describes location as central. She denies any dyspnea, fevers, chills, cough, hemoptysis. She denies any recent travel, surgery or hx of cancer. She was previously treated for provoked PE during pregnancy w/ warfarain.  A/P Presents for  chest pain work-up. Duration and description not consistent with ACS. Does have hx of PE but Wells score of 1.5 w/ 1.3% risk of PE. However, on recent work-up for her leg pain, she was noted to have elevated d-dimer at 0.85. Considering her prior hx, will get CTA to r/o PE. Other differential includes panic attacks, fibromyalgia or GERD. - Meloxicam 7.5 for pain - CTA chest  Myalgia, other site Presents for f/u visit for leg pain. Initially thought to be related to her  COVID vaccine due to possible myalgia from vaccine side effect but CK negative. Recent lab at urgent care w/ elevated d-dimer. Lower extremity ultrasound was negative for blood clots. Continues to experience bilateral leg pains but more concerned about chest pain this visit.  - NSAIDs PRN    Patient discussed with Dr.Guilloud  -Gilberto Better, PGY2 [facility] Pager: [phone]

## 2019-09-24 NOTE — Assessment & Plan Note (Signed)
Presents for f/u visit for leg pain. Initially thought to be related to her COVID vaccine due to possible myalgia from vaccine side effect but CK negative. Recent lab at urgent care w/ elevated d-dimer. Lower extremity ultrasound was negative for blood clots. Continues to experience bilateral leg pains but more concerned about chest pain this visit.  - NSAIDs PRN

## 2019-09-24 NOTE — Assessment & Plan Note (Addendum)
Kim [name] is a 49 yo F w/ PMH of provoked PE presenting w/ complaint of chest pain. She is Spanish speaking and she is examined and evaluated with family member acting as interpreter per patient request. She presents for follow up for her myalgia. She recounts how about a month ago she was in her usual state of health until she had gradual onset stabbing pain in various areas of her body, including her legs. She was worked up for possible DVT w/ elevated d-dimer at an Urgent Care center at the time. Since then, she mentions that her pain appears to be now located in her chest. She mentions having gradual worsening severity with worsening pleuritic pain with inspiration, describes location as central. She denies any dyspnea, fevers, chills, cough, hemoptysis. She denies any recent travel, surgery or hx of cancer. She was previously treated for provoked PE during pregnancy w/ warfarain.  A/P Presents for chest pain work-up. Duration and description not consistent with ACS. Does have hx of PE but Wells score of 1.5 w/ 1.3% risk of PE. However, on recent work-up for her leg pain, she was noted to have elevated d-dimer at 0.85. Considering her prior hx, will get CTA to r/o PE. Other differential includes panic attacks, fibromyalgia or GERD. - Meloxicam 7.5 for pain - CTA chest

## 2019-09-24 NOTE — Patient Instructions (Addendum)
Dear Ms[facility],  Thank you for allowing [place] to provide your care today. Today we discussed your chest pain    I have ordered CT angiography of the chest for you. I will call if any are abnormal.    Today we made the following changes to your medications:    Please take meloxicam as needed for pain  Please follow-up in 3 months.    Should you have any questions or concerns please call the internal medicine clinic at [phone].    Thank you for choosing [facility].  Embolia pulmonar Pulmonary Embolism  Una embolia pulmonar (EP) es una obstruccin repentina o una disminucin del flujo de sangre en uno o en ambos pulmones. La mayora de las obstrucciones proviene de un cogulo de sangre que se forma en una vena de una pierna, un muslo o un brazo (trombosis venosa profunda, TVP) y se traslada a los pulmones. Un cogulo es sangre que se ha espesado y convertido en gel o se ha solidificado. La EP es una afeccin peligrosa y potencialmente mortal que requiere tratamiento inmediato. Cules son las causas? Esta afeccin generalmente es causada por un cogulo de sangre que se forma en una vena y se traslada a los pulmones. En algunos casos raros, puede causarla aire, [place], parte de un tumor u otro tejido que se mueve a travs de las [employer]. Qu incrementa el riesgo? Los siguientes factores pueden hacer que sea ms propenso a Armed forces training and education officer afeccin:  Tener una lesin traumtica, como fractura de cadera o pierna.  Tener: ? Lesin en la mdula espinal. ? Ciruga ortopdica, especialmente reemplazo de cadera o rodilla. ? Cualquier Chief Technology Officer. ? Un accidente cerebrovascular. ? Trombosis venosa profunda (TVP). ? Cogulos de [place] o enfermedad de coagulacin de Herbalist. ? Una enfermedad cardaca o pulmonar a largo plazo (crnica). ? Tratamiento para el cncer con quimioterapia. ? Un catter venoso central.  Tomar medicamentos que contengan  estrgenos. Estos incluyen las pldoras anticonceptivas y la terapia de reemplazo hormonal.  Tener las siguientes caractersticas: ? Mujeres embarazadas. ? Estar en el perodo de tiempo despus del parto (posparto). ? Mayores de 01UXN. ? Sobrepeso. ? Ser fumador, especialmente si tiene otros riesgos. Cules son los signos o los sntomas? Los sntomas de esta afeccin generalmente comienzan de [place] repentina e incluyen lo siguiente:  Falta de aire durante la actividad o en reposo.  Tos o tos con sangre o expulsar mucosidad sanguinolenta al toser.  Dolor de pecho que empeora al inspirar profundamente.  Latidos cardacos irregulares o rpidos.  Sentirse aturdido o [employer].  Desmayos.  Sensacin de ansiedad.  Cristy Hilts.  Sudoracin.  Dolor e hinchazn en una pierna. Este es un sntoma de TVP, que puede desencadenar [place]. Cmo se diagnostica? Esta afeccin se puede diagnosticar en funcin de lo siguiente:  Sus antecedentes mdicos.  Un examen fsico.  Anlisis de sangre.  Angiograma pulmonar por tomografa computarizada. Esta prueba analiza el flujo sanguneo dentro y alrededor [employer].  Gammagrafa pulmonar de ventilacin y perfusin, tambin llamada exploracin de VQ. Esta prueba mide el flujo de aire y el flujo de sangre que llega a los pulmones.  [place] piernas. Cmo se trata? El tratamiento de esta afeccin depende de varios factores, tales como la causa de su EP, el riesgo de hemorragia o de que se formen ms cogulos, y cualquier otra afeccin que tenga. El tratamiento pretende quitar, Radiation protection practitioner o Ambulance person formacin o el crecimiento de  cogulos de [place]. El tratamiento puede incluir:  Medicamentos, como por ejemplo: ? Medicamentos diluyentes de la sangre (anticoagulantes) para Ambulance person formacin de cogulos. ? Medicamentos que [employer] (trombolticos).  Procedimientos, por ejemplo: ? Usar un tubo flexible para extraer  un cogulo de sangre (embolectoma) o para administrar medicamentos para destruirlo (tromblisis dirigida por catter). ? Insertar un filtro en una vena grande que lleva sangre al corazn (vena cava inferior). Este filtro (filtro de vena cava) atrapa los cogulos de sangre antes de que lleguen a los pulmones. ? Ciruga para eliminar el cogulo (embolectoma [place]). Esto es poco frecuente. Es posible que necesite un tratamiento inmediato, uno a Barrister's clerk (hasta 3 meses despus del diagnstico) y uno prolongado (ms de 3 meses despus del diagnstico). Su tratamiento puede continuar por varios meses (terapia de mantenimiento). Usted junto con su mdico trabajarn para elegir el mejor programa de tratamiento para usted. Siga estas indicaciones en su casa: Medicamentos  Tome los medicamentos de venta libre y los recetados solamente como se lo haya indicado el mdico.  Si toma un anticoagulante: ? Theme park manager medicamento todos los das a la misma hora. ? Sepa cules alimentos y frmacos interactan con su medicamento. ? Conozca los efectos secundarios de este medicamento, incluida la excesiva formacin de moretones o sangrado. Consulte al mdico o al farmacutico acerca de otros efectos secundarios. Indicaciones generales  Use un brazalete de alerta mdica o lleve una tarjeta de alerta mdica que diga que ha tenido [place] EP y que [employer] medicamentos que [place].  Pregntele al mdico cundo podr retomar sus [employer]. Evite estar sentado o acostado durante perodos largos sin moverse.  Mantenga un peso saludable. Pregunte a su mdico cul es un peso saludable para usted.  No consuma ningn producto que contenga nicotina o tabaco, como cigarrillos, cigarrillos electrnicos y tabaco de Higher education careers adviser. Si necesita ayuda para dejar de consumir, consulte al mdico.  Hable sobre sus planes de viaje con su mdico. Es importante que se asegure de que an podr tomar su medicamento mientras est de  viaje.  Concurra a todas las visitas de control como se lo haya indicado el mdico. Esto es importante. Comunquese con un mdico si:  Se salte una dosis del anticoagulante. Solicite ayuda inmediatamente si:  Tiene los siguientes sntomas: ? Dolor, hinchazn, calor o enrojecimiento nuevos en un brazo o una pierna o presenta un aumento de alguno de estos sntomas. ? Entumecimiento u hormigueo en un brazo o una pierna. ? Falta de Nordstrom o en reposo. ? Cristy Hilts. ? Tourist information centre manager. ? Latidos cardacos irregulares o rpidos. ? Dolor de cabeza intenso. ? Cambios en la visin. ? Una cada o un accidente grave, o se golpea la cabeza. ? Dolor de estmago (abdominal). ? [employer], las heces o la [place]. ? Un corte que no deja de sangrar.  Tose y escupe sangre.  Se siente aturdido o mareado.  No puede mover los brazos o las piernas.  Se siente desorientado o pierde la memoria. Estos sntomas pueden representar un problema grave que constituye Engineer, maintenance (IT). No espere a ver si los sntomas desaparecen. Solicite atencin mdica de inmediato. Comunquese con el servicio de emergencias de su localidad (911 en los [place]). No conduzca por sus propios medios Goldman [employer] hospital. Resumen  Una embolia pulmonar (EP) es una obstruccin repentina o una disminucin del flujo de sangre en uno o en ambos pulmones. La EP es una afeccin peligrosa y  potencialmente mortal que requiere tratamiento inmediato.  Los tratamientos para esta afeccin generalmente incluyen medicamentos para diluir la sangre (anticoagulantes) o medicamentos para [employer] cogulos de Biochemist, clinical (trombolticos).  Si le dan anticoagulantes, es importante que tome el medicamento a la misma hora [employer].  Sepa cules alimentos y frmacos interactan con los medicamentos que est tomando.  Comunquese con el servicio de emergencias de su localidad (911 en los [place]) si presenta signos  de EP o TVP. Esta informacin no tiene Marine scientist el consejo del mdico. Asegrese de hacerle al mdico cualquier pregunta que tenga. Document Revised: 02/19/2018 Document Reviewed: 02/19/2018 Elsevier Patient Education  [place].

## 2019-09-24 NOTE — Assessment & Plan Note (Signed)
Noted to have random glucose elevated at 126 during prior labs. Noted to have previous hemoglobin a1c of 5.3 in 2019. Denies any polyuria, polydipsia, polyphagia.  - Check hgb a1c  Addendum: Hgb a1c 5.2, does not have diabetes

## 2019-09-25 ENCOUNTER — Ambulatory Visit (HOSPITAL_COMMUNITY)
Admission: RE | Admit: 2019-09-25 | Discharge: 2019-09-25 | Disposition: A | Discharge status: Home / Self Care | Payer: BC Managed Care – PPO | Source: Ambulatory Visit | Attending: Student in an Organized Health Care Education/Training Program | Admitting: Student in an Organized Health Care Education/Training Program

## 2019-09-25 DIAGNOSIS — R071 Chest pain on breathing: Secondary | ICD-10-CM | POA: Diagnosis not present

## 2019-09-25 DIAGNOSIS — R0602 Shortness of breath: Secondary | ICD-10-CM | POA: Diagnosis not present

## 2019-09-25 MED ORDER — IOHEXOL 350 MG/ML SOLN
100.0000 mL | Freq: Once | INTRAVENOUS | Status: AC | PRN
  Administered 2019-09-25: 100 mL via INTRAVENOUS

## 2019-09-25 NOTE — Addendum Note (Signed)
Addended by: Marcelino Duster on: 09/25/2019 04:01 PM   Modules accepted: Orders

## 2019-09-25 NOTE — Progress Notes (Signed)
Internal Medicine Clinic Attending ° °Case discussed with Dr. Lee at the time of the visit.  We reviewed the resident’s history and exam and pertinent patient test results.  I agree with the assessment, diagnosis, and plan of care documented in the resident’s note.  °

## 2019-09-28 ENCOUNTER — Telehealth: Payer: Self-pay | Admitting: Internal Medicine

## 2019-09-28 NOTE — Telephone Encounter (Signed)
Attempted to contact [name] [facility] to discuss CT results. Mobile number not on. Voicemail full. Home number voicemail message with different name. Unable to verify contact number.

## 2019-10-30 ENCOUNTER — Ambulatory Visit: Payer: BC Managed Care – PPO | Admitting: Obstetrics & Gynecology

## 2019-10-30 ENCOUNTER — Other Ambulatory Visit: Payer: Self-pay

## 2019-10-30 ENCOUNTER — Encounter: Payer: Self-pay | Admitting: Obstetrics & Gynecology

## 2019-10-30 VITALS — BP 120/82

## 2019-10-30 DIAGNOSIS — G44329 Chronic post-traumatic headache, not intractable: Secondary | ICD-10-CM | POA: Diagnosis not present

## 2019-10-30 DIAGNOSIS — K640 First degree hemorrhoids: Secondary | ICD-10-CM

## 2019-10-30 DIAGNOSIS — R300 Dysuria: Secondary | ICD-10-CM

## 2019-10-30 DIAGNOSIS — N914 Secondary oligomenorrhea: Secondary | ICD-10-CM

## 2019-10-30 DIAGNOSIS — Z01419 Encounter for gynecological examination (general) (routine) without abnormal findings: Secondary | ICD-10-CM | POA: Diagnosis not present

## 2019-10-30 DIAGNOSIS — M542 Cervicalgia: Secondary | ICD-10-CM | POA: Diagnosis not present

## 2019-10-30 LAB — PROLACTIN: Prolactin: 2.2 ng/mL

## 2019-10-30 LAB — TSH: TSH: 1.57 mIU/L

## 2019-10-30 NOTE — Progress Notes (Signed)
[place] 24-Nov-1970 [id number]   History:    49 y.o. G5P5L5 Married  RP:  Established patient presenting for annual gyn exam   HPI:  Oligomenorrhea with cycles every 1-3 months, normal flow.  No BTB.  H/O PE.  Rarely sexually active. Increased vaginal d/c.  No pelvic pain currently, but intermittent lower abdominal discomfort.  C/O dysuria, no other UTI Sx. BMs wnl, but rectal itching.  Breasts wnl.  BMI declined weight.  Health Labs with Fam MD.   Past medical history,surgical history, family history and social history were all reviewed and documented in the EPIC chart.  Gynecologic History Patient's last menstrual period was 09/03/2019.  Obstetric History OB History  Gravida Para Term Preterm AB Living  5 5 5     5   SAB TAB Ectopic Multiple Live Births        0 4    # Outcome Date GA Lbr Len/2nd Weight Sex Delivery Anes PTL Lv  5 Term 11/04/14 [redacted]w[redacted]d  7 lb 8.5 oz (3.416 kg) F Vag-Spont EPI  LIV  4 Term 03/18/08 [redacted]w[redacted]d    Vag-Spont  N LIV  3 Term 11/07/00 [redacted]w[redacted]d    Vag-Spont  N LIV  2 Term 07/09/90 [redacted]w[redacted]d    Vag-Spont  N LIV  1 Term 08/24/89 [redacted]w[redacted]d    Vag-Spont        ROS: A ROS was performed and pertinent positives and negatives are included in the history.  GENERAL: No fevers or chills. HEENT: No change in vision, no earache, sore throat or sinus congestion. NECK: No pain or stiffness. CARDIOVASCULAR: No chest pain or pressure. No palpitations. PULMONARY: No shortness of breath, cough or wheeze. GASTROINTESTINAL: No abdominal pain, nausea, vomiting or diarrhea, melena or bright red blood per rectum. GENITOURINARY: No urinary frequency, urgency, hesitancy or dysuria. MUSCULOSKELETAL: No joint or muscle pain, no back pain, no recent trauma. DERMATOLOGIC: No rash, no itching, no lesions. ENDOCRINE: No polyuria, polydipsia, no heat or cold intolerance. No recent change in weight. HEMATOLOGICAL: No anemia or easy bruising or bleeding. NEUROLOGIC: No headache, seizures,  numbness, tingling or weakness. PSYCHIATRIC: No depression, no loss of interest in normal activity or change in sleep pattern.     Exam:   BP 120/82 (BP Location: Right Arm, Patient Position: Sitting, Cuff Size: Normal)   LMP 09/03/2019   There is no height or weight on file to calculate BMI.  General appearance : Well developed well nourished female. No acute distress HEENT: Eyes: no retinal hemorrhage or exudates,  Neck supple, trachea midline, no carotid bruits, no thyroidmegaly Lungs: Clear to auscultation, no rhonchi or wheezes, or rib retractions  Heart: Regular rate and rhythm, no murmurs or gallops Breast:Examined in sitting and supine position were symmetrical in appearance, no palpable masses or tenderness,  no skin retraction, no nipple inversion, no nipple discharge, no skin discoloration, no axillary or supraclavicular lymphadenopathy Abdomen: no palpable masses or tenderness, no rebound or guarding Extremities: no edema or skin discoloration or tenderness  Pelvic: Vulva: Normal             Vagina: No gross lesions or discharge  Cervix: No gross lesions or discharge.  Pap with HPV HR done.  Uterus  AV, normal size, shape and consistency, non-tender and mobile  Adnexa  Without masses or tenderness  Anus: Normal.  No external hemorrhoid visible at this time.  U/A: Yellow clear, nitrites negative, proteins negative, white blood cells 0-5, red blood cells negative, few bacteria.  Urine culture pending.   Assessment/Plan:  49 y.o. female for annual exam   1. Encounter for routine gynecological examination with Papanicolaou smear of cervix Normal gynecologic exam.  Pap with HR HPV done.  Breast exam normal.  Health Labs with Fam MD.  2. Secondary oligomenorrhea Probable Perimenopause.  Will r/o Endocrine dysfunction. - Prolactin - TSH  3. Dysuria U/A Negative. - Urinalysis,Complete w/RFL Culture  4. Grade I hemorrhoids Refer to General Surgery at patient's request.   Used CS cream, but still itching and patient sees/feels an external hemorrhoid after BMs.  Other orders - zonisamide (ZONEGRAN) 100 MG capsule; Take 300 mg by mouth daily.  Princess Bruins MD, 9:53 AM 10/30/2019

## 2019-10-31 ENCOUNTER — Telehealth: Payer: Self-pay | Admitting: *Deleted

## 2019-10-31 NOTE — Telephone Encounter (Signed)
Staff message sent to [place] referral coordinator to call and schedule at [facility] surgery, referral also placed in [facility].

## 2019-10-31 NOTE — Telephone Encounter (Signed)
-----   Message from Princess Bruins, MD sent at 10/30/2019 10:25 AM EDT ----- Regarding: Refer to General Surgery Refer to General Surgery for symptomatic hemorrhoids.  Not visualized today, but per patient painful and bleeding and visible outside after pushing.  Not improved with CS cream.

## 2019-11-01 LAB — URINALYSIS, COMPLETE W/RFL CULTURE
Bilirubin Urine: NEGATIVE
Glucose, UA: NEGATIVE
Hgb urine dipstick: NEGATIVE
Hyaline Cast: NONE SEEN /LPF
Ketones, ur: NEGATIVE
Nitrites, Initial: NEGATIVE
Protein, ur: NEGATIVE
RBC / HPF: NONE SEEN /HPF (ref 0–2)
Specific Gravity, Urine: 1.01 (ref 1.001–1.03)
pH: 6 (ref 5.0–8.0)

## 2019-11-01 LAB — URINE CULTURE
MICRO NUMBER:: 10704154
SPECIMEN QUALITY:: ADEQUATE
Sample Source: 0

## 2019-11-01 LAB — CULTURE INDICATED

## 2019-11-04 ENCOUNTER — Encounter: Payer: Self-pay | Admitting: Obstetrics & Gynecology

## 2019-11-04 DIAGNOSIS — I671 Cerebral aneurysm, nonruptured: Secondary | ICD-10-CM | POA: Diagnosis not present

## 2019-11-04 LAB — PAP, TP IMAGING W/ HPV RNA, RFLX HPV TYPE 16,18/45: HPV DNA High Risk: NOT DETECTED

## 2019-11-04 NOTE — Telephone Encounter (Signed)
Patient scheduled on 11/20/19 with Dr.[name] [place] Silos.

## 2019-11-05 ENCOUNTER — Ambulatory Visit (INDEPENDENT_AMBULATORY_CARE_PROVIDER_SITE_OTHER): Payer: BC Managed Care – PPO | Admitting: Psychology

## 2019-11-05 DIAGNOSIS — F4322 Adjustment disorder with anxiety: Secondary | ICD-10-CM | POA: Diagnosis not present

## 2019-12-12 DIAGNOSIS — M542 Cervicalgia: Secondary | ICD-10-CM | POA: Diagnosis not present

## 2019-12-12 DIAGNOSIS — G44329 Chronic post-traumatic headache, not intractable: Secondary | ICD-10-CM | POA: Diagnosis not present

## 2019-12-17 ENCOUNTER — Ambulatory Visit (INDEPENDENT_AMBULATORY_CARE_PROVIDER_SITE_OTHER): Payer: BC Managed Care – PPO | Admitting: Psychology

## 2019-12-17 DIAGNOSIS — F4323 Adjustment disorder with mixed anxiety and depressed mood: Secondary | ICD-10-CM | POA: Diagnosis not present

## 2019-12-17 DIAGNOSIS — K641 Second degree hemorrhoids: Secondary | ICD-10-CM | POA: Diagnosis not present

## 2019-12-24 ENCOUNTER — Other Ambulatory Visit: Payer: Self-pay | Admitting: Internal Medicine

## 2019-12-24 DIAGNOSIS — R071 Chest pain on breathing: Secondary | ICD-10-CM

## 2020-01-07 ENCOUNTER — Encounter: Payer: BC Managed Care – PPO | Admitting: Obstetrics & Gynecology

## 2020-01-09 ENCOUNTER — Ambulatory Visit: Payer: BC Managed Care – PPO | Admitting: Psychology

## 2020-01-22 ENCOUNTER — Other Ambulatory Visit: Payer: Self-pay | Admitting: Student

## 2020-01-22 DIAGNOSIS — R071 Chest pain on breathing: Secondary | ICD-10-CM

## 2020-01-22 NOTE — Telephone Encounter (Signed)
Attempted to call patient with Spanish Interpreter services; however, patient unavailable and voicemail full. Will refill Mobic to pharmacy and reach out to front desk to attempt to schedule for follow-up appointment with Dr. Truman Hayward this month.  Jeralyn Bennett, PGY1 Internal Medicine [phone]

## 2020-01-23 ENCOUNTER — Telehealth: Payer: Self-pay | Admitting: Internal Medicine

## 2020-01-23 DIAGNOSIS — M542 Cervicalgia: Secondary | ICD-10-CM | POA: Diagnosis not present

## 2020-01-23 DIAGNOSIS — G44329 Chronic post-traumatic headache, not intractable: Secondary | ICD-10-CM | POA: Diagnosis not present

## 2020-01-23 NOTE — Telephone Encounter (Signed)
Please refer to message below.  Patient was called at the request of Dr. Konrad Penta.  I was able to speak with the patient.  She agreed to an appointment next Thursday 01/30/2020 at 10:15 am with Dr. Truman Hayward.  Forwarding message back to yellow team.

## 2020-01-23 NOTE — Telephone Encounter (Signed)
-----   Message from Jeralyn Bennett, MD sent at 01/22/2020  9:19 AM EDT ----- Regarding: Appointment Needed Hello,  I attempted to reach out to Ms. Montes Jenison to schedule an appointment but was unable to reach her. Could we please try calling again to see if she is able to come in for follow up appointment with Dr. Truman Hayward this month?  Thank you,  Jeralyn Bennett, Pgy1

## 2020-01-29 DIAGNOSIS — K648 Other hemorrhoids: Secondary | ICD-10-CM | POA: Diagnosis not present

## 2020-01-30 ENCOUNTER — Other Ambulatory Visit: Payer: Self-pay

## 2020-01-30 ENCOUNTER — Encounter: Payer: Self-pay | Admitting: Internal Medicine

## 2020-01-30 ENCOUNTER — Ambulatory Visit: Payer: BC Managed Care – PPO | Admitting: Internal Medicine

## 2020-01-30 VITALS — BP 99/73 | HR 66 | Temp 97.7°F | Ht 65.0 in | Wt 195.9 lb

## 2020-01-30 DIAGNOSIS — F411 Generalized anxiety disorder: Secondary | ICD-10-CM

## 2020-01-30 DIAGNOSIS — F418 Other specified anxiety disorders: Secondary | ICD-10-CM

## 2020-01-30 DIAGNOSIS — F4521 Hypochondriasis: Secondary | ICD-10-CM

## 2020-01-30 DIAGNOSIS — I671 Cerebral aneurysm, nonruptured: Secondary | ICD-10-CM | POA: Diagnosis not present

## 2020-01-30 DIAGNOSIS — Z23 Encounter for immunization: Secondary | ICD-10-CM | POA: Diagnosis not present

## 2020-01-30 MED ORDER — CITALOPRAM HYDROBROMIDE 20 MG PO TABS: 20.0000 mg | ORAL_TABLET | Freq: Every day | ORAL | 2 refills | Status: DC

## 2020-01-30 NOTE — Patient Instructions (Addendum)
Thank you for allowing [place] to provide your care today. Today we discussed your headaches    I have ordered no labs for you. I will call if any are abnormal.    Today we made no changes to your medications.    Please follow-up in 6 months.    Should you have any questions or concerns please call the internal medicine clinic at [phone].     Cefalea de rebote por consumo excesivo de analgsicos (Analgesic Rebound Headache) Una cefalea de rebote por consumo excesivo de analgsicos, a veces llamada cefalea por abuso de medicamentos, es un dolor de cabeza que se manifiesta despus de que desaparece el efecto de los analgsicos tomados para tratar el dolor de cabeza original (primario). Cualquier tipo de dolor de cabeza primario puede regresar como una cefalea de rebote si una persona toma analgsicos regularmente ms de tres veces por semana para tratarlo. Los tipos de Social research officer, government de cabeza primario que se suelen TEFL teacher a las cefaleas de rebote incluyen:  Migraas.  Dolores de cabeza que surgen por una tensin muscular en la zona de la cabeza y del cuello (cefaleas tensionales).  Dolores de cabeza que aparecen y vuelven a Arts administrator (recurrentes) de un lado de la cabeza y alrededor del ojo (cefaleas en racimos). Si las cefaleas de rebote continan, estas se convierten en dolores de cabeza diarios crnicos. CAUSAS Esta afeccin puede ser causada por el uso frecuente de:  Medicamentos de venta libre, como aspirina, ibuprofeno y paracetamol.  Antisinusticos y otros medicamentos que contienen cafena.  Analgsicos opiceos, como codena y [place]. SNTOMAS Los sntomas de la cefalea de rebote son los mismos que los del dolor de [place] original. Algunos de los sntomas de tipos especficos de cefaleas incluyen lo siguiente: Consulting civil engineer  Dolor pulstil e intermitente en uno o ambos lados de la cabeza.  Dolor intenso que dificulta las actividades cotidianas.  Dolor que empeora con la  actividad fsica.  Nuseas, vmitos o ambos.  Dolor ante la exposicin a luces brillantes, ruidos fuertes o aromas intensos.  Sensibilidad general a las luces brillantes, a los ruidos fuertes o a los aromas intensos.  Cambios en la visin.  Adormecimiento de uno o [employer]. Cefalea tensional.  Presin alrededor de la cabeza.  Dolor "sordo" en la cabeza.  Dolor que siente sobre la frente y los lados de la cabeza.  Sensibilidad en los msculos de la cabeza, del cuello y de los hombros. [place] en racimos  Dolor intenso que comienza en un ojo, alrededor de un ojo o en la sien.  Enrojecimiento y lagrimeo del ojo del mismo lado del dolor.  Prpados cados o hinchados.  Dolor en un lado de la cabeza.  Nuseas.  Secrecin nasal.  Piel del rostro sudorosa y plida.  Agitacin. DIAGNSTICO Esta afeccin se diagnostica mediante:  Una revisin de sus antecedentes mdicos. Esto incluye la naturaleza de sus dolores de cabeza primarios.  Una revisin de los tipos de analgsicos que ha estado tomando para tratar las cefaleas y la frecuencia con que los toma. TRATAMIENTO Esta afeccin se puede tratar de la siguiente manera:  Interrumpir el uso frecuente de los analgsicos. Al principio, esto puede [employer] cefaleas, pero, con el tiempo, el dolor debe volverse ms controlable, menos frecuente y [employer].  Consultar a un especialista en cefaleas. Tal vez este profesional pueda ayudarlo a Chief Technology Officer las cefaleas y ayudar a Programmer, multimedia que estas no tengan otra causa.  Emplear mtodos alternativos de [place] del estrs, como la [place],  la psicoterapia, la biorregulacin y los masajes tambin pueden ayudar. Hable con el mdico respecto de qu mtodo podra ser adecuado para su caso. [place] los medicamentos de venta libre y los recetados solamente como se lo haya indicado el mdico.  Interrumpa el uso repetido de los [place],  como se lo haya indicado el mdico. Tal interrupcin puede ser difcil. Siga atentamente las indicaciones del mdico.  Evite los factores desencadenantes que se sabe le causan las cefaleas primarias.  Concurra a todas las visitas de control como se lo haya indicado el mdico. Esto es importante. SOLICITE ATENCIN MDICA SI:  Sigue teniendo cefaleas despus de Optometrist los tratamientos que el mdico recomend. SOLICITE ATENCIN MDICA DE INMEDIATO SI:  Tiene nuevas cefaleas.  Tiene cefaleas que son diferentes de las que tuvo en el pasado.  Tiene adormecimiento u hormigueos en los brazos o las piernas.  Tiene cambios en la visin o en el habla. Esta informacin no tiene Marine scientist el consejo del mdico. Asegrese de hacerle al mdico cualquier pregunta que tenga. Document Revised: 02/23/2016 Document Reviewed: 09/07/2015 Elsevier Patient Education  2020 [employer].

## 2020-01-31 ENCOUNTER — Encounter: Payer: Self-pay | Admitting: Internal Medicine

## 2020-01-31 DIAGNOSIS — F410 Panic disorder [episodic paroxysmal anxiety] without agoraphobia: Secondary | ICD-10-CM | POA: Insufficient documentation

## 2020-01-31 DIAGNOSIS — F418 Other specified anxiety disorders: Secondary | ICD-10-CM | POA: Insufficient documentation

## 2020-01-31 DIAGNOSIS — F064 Anxiety disorder due to known physiological condition: Secondary | ICD-10-CM | POA: Insufficient documentation

## 2020-01-31 DIAGNOSIS — I671 Cerebral aneurysm, nonruptured: Secondary | ICD-10-CM | POA: Insufficient documentation

## 2020-01-31 NOTE — Assessment & Plan Note (Addendum)
Kim [name] is a 49 yo F w/ PMh of GERD, Gestational diabetes, Provoked Peripartum pulmonary embolism presenting to [facility] with complaint of new headache. She is Spanish speaking and history was obtained with assistance from video interpreter. She mentions that she was in her usual state of health until couple months ago when she had MRA done for her head. Since then she feels she has episodes of headaches and abnormal sensation in her head that is 'hard to describe.' She mentions that she has not noticed any new focal weakness, blurry vision, tingling. She does have some numbness when she sits on her legs for prolonged periods of time. She admits that she has been distraught that her aneurysm may be growing due to knowing someone personally who have had a sudden death from aneurysm rupture and she admits that she 'reads things online too much.'  A/p Presents w/ non-specific sensory disturbances after diagnosis of cerebral aneurysm. No neurological deficits on through neuro exam. Likely due to anxiety. Chart review shows previously on sertraline, amitriptyline and citalopram for anxiety but has self-discontinued due to lack of improvement. Complicated by poor health literacy. Provided reassurance and discussed restarting SSRI therapy, which she is agreeable to. - Start citalopram 20mg  daily - Reassurance provided

## 2020-01-31 NOTE — Assessment & Plan Note (Signed)
Follows with interventional radiology for observation of internal carotid aneurysms. Recently had MRA head performed showing stable aneurysm without increase in size. Currently mentions headache but no focal neurological deficits on exam. Headache likely due to illness anxiety.   - Discussed importance of blood pressure monitoring and avoiding meds/supplements that may provoke bleeding - F/u with IR

## 2020-01-31 NOTE — Progress Notes (Signed)
CC: headache  HPI: [name] is a 48 y.o. with PMH listed below presenting with complaint of headache. Please see problem based assessment and plan for further details.  Past Medical History:  Diagnosis Date  . Abnormal Pap smear    patient states she had cancer that was removed from her cervix  . Asthma    when pregnant/ once 6 yrs ago only time asthma attack the dr said  . Cancer ([place])   . Clotting disorder ([place])   . Gestational diabetes   . Intramural leiomyoma of uterus 02/29/2016  . Pulmonary embolism ([place])   . Renal insufficiency   . Vitamin D deficiency   . Vitamin D deficiency    Review of Systems: Review of Systems  Constitutional: Negative for chills, fever and malaise/fatigue.  Eyes: Negative for blurred vision.  Respiratory: Negative for shortness of breath.   Cardiovascular: Negative for chest pain, palpitations and leg swelling.  Gastrointestinal: Negative for constipation, diarrhea, nausea and vomiting.  Genitourinary: Negative for dysuria.  Musculoskeletal: Negative for back pain and joint pain.  Neurological: Positive for headaches. Negative for dizziness, tingling, sensory change, focal weakness and weakness.  All other systems reviewed and are negative.    Physical Exam: Vitals:   01/30/20 1028  BP: 99/73  Pulse: 66  Temp: 97.7 F (36.5 C)  TempSrc: Oral  SpO2: 98%  Weight: 195 lb 14.4 oz (88.9 kg)  Height: 5\' 5"  (1.651 m)   Gen: Well-developed, well nourished, NAD HEENT: NCAT head, hearing intact, no sinus tenderness CV: RRR, S1, S2 normal Pulm: CTAB, No rales, no wheezes Extm: ROM intact, Peripheral pulses intact, No peripheral edema Skin: Dry, Warm, normal turgor, no wounds, no rashes, no lesions Neurologic exam: Mental status: A&Ox3 Cranial Nerves:             II: PERRL             III, IV, VI: Extra-occular motions intact bilaterally             V, VII: Face symmetric, sensation intact in all 3 divisions                VIII: hearing normal to rubbing fingers bilaterally               IX, X: palate rises symmetrically             XI: Head turn and shoulder shrug normal bilaterally               XII: tongue midline    Motor: Strength 5/5 on all upper and lower extremities, bulk muscle and tone are normal Deep Tendon Reflexes: 2+ symmetric   Gait: Normal Sensory: Light touch intact and symmetric bilaterally  Coordination: There is no dysmetria on finger-to-nose. Rapid alternating movement test normal. Psychiatric: Normal mood and affect  Assessment & Plan:   Need for immunization against influenza Agrees to receive influenza vaccine this visit.  Anxiety with somatic features [name] is a 49 yo F w/ PMh of GERD, Gestational diabetes, Provoked Peripartum pulmonary embolism presenting to [facility] with complaint of new headache. She is Spanish speaking and history was obtained with assistance from video interpreter. She mentions that she was in her usual state of health until couple months ago when she had MRA done for her head. Since then she feels she has episodes of headaches and abnormal sensation in her head that is 'hard to describe.' She mentions that she has not  noticed any new focal weakness, blurry vision, tingling. She does have some numbness when she sits on her legs for prolonged periods of time. She admits that she has been distraught that her aneurysm may be growing due to knowing someone personally who have had a sudden death from aneurysm rupture and she admits that she 'reads things online too much.'  A/p Presents w/ non-specific sensory disturbances after diagnosis of cerebral aneurysm. No neurological deficits on through neuro exam. Likely due to anxiety. Chart review shows previously on sertraline, amitriptyline and citalopram for anxiety but has self-discontinued due to lack of improvement. Complicated by poor health literacy. Provided reassurance and discussed restarting SSRI therapy, which  she is agreeable to. - Start citalopram 20mg  daily - Reassurance provided   Cerebral aneurysm without rupture Follows with interventional radiology for observation of internal carotid aneurysms. Recently had MRA head performed showing stable aneurysm without increase in size. Currently mentions headache but no focal neurological deficits on exam. Headache likely due to illness anxiety.   - Discussed importance of blood pressure monitoring and avoiding meds/supplements that may provoke bleeding - F/u with IR    Patient discussed with Dr. Rebeca Alert  -Gilberto Better, [facility] Pager: [phone]

## 2020-01-31 NOTE — Assessment & Plan Note (Signed)
Agrees to receive influenza vaccine this visit 

## 2020-01-31 NOTE — Progress Notes (Signed)
Internal Medicine Clinic Attending  Case discussed with Dr. Lee at the time of the visit.  We reviewed the resident's history and exam and pertinent patient test results.  I agree with the assessment, diagnosis, and plan of care documented in the resident's note.  Alexander Raines, M.D., Ph.D.  

## 2020-02-03 ENCOUNTER — Other Ambulatory Visit: Payer: Self-pay

## 2020-02-03 ENCOUNTER — Emergency Department (HOSPITAL_COMMUNITY): Payer: BC Managed Care – PPO

## 2020-02-03 ENCOUNTER — Emergency Department (HOSPITAL_COMMUNITY)
Admission: EM | Admit: 2020-02-03 | Discharge: 2020-02-04 | Disposition: A | Discharge status: Home / Self Care | Payer: BC Managed Care – PPO | Attending: Emergency Medicine | Admitting: Emergency Medicine

## 2020-02-03 DIAGNOSIS — Z8679 Personal history of other diseases of the circulatory system: Secondary | ICD-10-CM | POA: Insufficient documentation

## 2020-02-03 DIAGNOSIS — M546 Pain in thoracic spine: Secondary | ICD-10-CM | POA: Diagnosis not present

## 2020-02-03 DIAGNOSIS — Z91040 Latex allergy status: Secondary | ICD-10-CM | POA: Insufficient documentation

## 2020-02-03 DIAGNOSIS — J45909 Unspecified asthma, uncomplicated: Secondary | ICD-10-CM | POA: Insufficient documentation

## 2020-02-03 DIAGNOSIS — F450 Somatization disorder: Secondary | ICD-10-CM | POA: Insufficient documentation

## 2020-02-03 DIAGNOSIS — Z79899 Other long term (current) drug therapy: Secondary | ICD-10-CM | POA: Diagnosis not present

## 2020-02-03 DIAGNOSIS — Z8742 Personal history of other diseases of the female genital tract: Secondary | ICD-10-CM | POA: Diagnosis not present

## 2020-02-03 DIAGNOSIS — Z859 Personal history of malignant neoplasm, unspecified: Secondary | ICD-10-CM | POA: Diagnosis not present

## 2020-02-03 DIAGNOSIS — F418 Other specified anxiety disorders: Secondary | ICD-10-CM

## 2020-02-03 DIAGNOSIS — R0789 Other chest pain: Secondary | ICD-10-CM | POA: Diagnosis not present

## 2020-02-03 DIAGNOSIS — M549 Dorsalgia, unspecified: Secondary | ICD-10-CM | POA: Diagnosis not present

## 2020-02-03 LAB — CBC
HCT: 40.5 % (ref 36.0–46.0)
Hemoglobin: 13.4 g/dL (ref 12.0–15.0)
MCH: 30.7 pg (ref 26.0–34.0)
MCHC: 33.1 g/dL (ref 30.0–36.0)
MCV: 92.7 fL (ref 80.0–100.0)
Platelets: 258 10*3/uL (ref 150–400)
RBC: 4.37 MIL/uL (ref 3.87–5.11)
RDW: 14.3 % (ref 11.5–15.5)
WBC: 9.5 10*3/uL (ref 4.0–10.5)
nRBC: 0 % (ref 0.0–0.2)

## 2020-02-03 MED ORDER — LORAZEPAM 2 MG/ML IJ SOLN
0.5000 mg | Freq: Once | INTRAMUSCULAR | Status: AC
  Administered 2020-02-03: 0.5 mg via INTRAVENOUS
  Filled 2020-02-03: qty 1

## 2020-02-03 NOTE — ED Provider Notes (Signed)
[facility] EMERGENCY DEPARTMENT Provider Note   CSN: [id number] Arrival date & time: 02/03/20  2134     History Chief Complaint  Patient presents with  . Chest Pain    [facility] [name] is a 49 y.o. female with a h/o of provoked peripartum PE, asthma, cerebral aneurysm, internal carotid aneurysms, gestational diabetes, GERD, intramural leiomyoma of uterus, anxiety with somatic features who presents to the emergency department by EMS with a chief complaint of back pain.  The patient's reports that she called EMS tonight due to chest pain in her left upper back.  She characterizes the pain as pressure.  Pain was present when she awoke this morning and has remained constant throughout the day.  It has been gradually worsening since onset.  Pain is not worse with positional changes, and there are no other known aggravating or alleviating factors.  It is nonradiating.  She is unable to tell me if she is previously had similar pain.   She feels that she is having some difficulty breathing due to the pressure in her back.  Shortness of breath is not worse with exertion.  She also reports that she is having "cramping" discomfort in her bilateral arms and legs that has been present for the last 2 days.  She is also endorsing a headache and nausea.  Per triage note, EMS reported that the patient was having chest pain.  However, the patient denies this complaint to me.  She denies numbness, weakness, cough, fever, chills, vomiting, diarrhea, rash, abdominal pain, dysuria, flank pain, slurred speech, visual changes.  She also reports that she is very nervous and anxious about the known aneurysms in her bilateral internal carotid arteries, and intracranial aneurysms.  She is followed by Dr. Katherine Roan with [facility] for aneurysms.  She was last seen and evaluated with imaging in July 2021 where no change was found in the size of the aneurysms.  Over the last week, she  has been increasingly worried about her health.  Earlier this week, a friend told her that having an aneurysm is worse than cancer.  She also had a conversation with another friend who told her that a family member suddenly died and it was due to a ruptured aneurysm and has been researching her symptoms online.  She also reports the death of someone close to her within the last few weeks.  Her husband also adds that a family member has been having marital issues and has been confiding in the patient, which has also made her more anxious.  Her husband notes that she has become so anxious that she does not want to be left alone in their home because she is afraid she might die.  He is advised her to try and relax and turn off her phone, but she is unable to stop ruminating about her health.    Earlier today, her husband told her that if she was having a headache that it might be her aneurysm, which caused her symptoms to worsen.  Per chart review, she was seen by her PCP on 10/14 and patient was found to have self discontinued her home sertraline, amitriptyline, and citalopram.  She restarted taking citalopram and has been compliant with the medication.  The history is provided by the patient and medical records. A language interpreter was used ([place]).       Past Medical History:  Diagnosis Date  . Abnormal Pap smear    patient states she had cancer  that was removed from her cervix  . Asthma    when pregnant/ once 6 yrs ago only time asthma attack the dr said  . Cancer ([place])   . Clotting disorder ([place])   . Gestational diabetes   . Intramural leiomyoma of uterus 02/29/2016  . Pulmonary embolism ([place])   . Renal insufficiency   . Vitamin D deficiency   . Vitamin D deficiency     Patient Active Problem List   Diagnosis Date Noted  . Anxiety with somatic features 01/31/2020  . Cerebral aneurysm without rupture 01/31/2020  . Other abnormal glucose 09/03/2019  . Intramural leiomyoma of  uterus 02/29/2016  . GERD (gastroesophageal reflux disease) 10/19/2015  . Need for immunization against influenza 01/28/2015  . Hx of pulmonary embolus during pregnancy 11/09/2014    Past Surgical History:  Procedure Laterality Date  . CHOLECYSTECTOMY       OB History    Gravida  5   Para  5   Term  5   Preterm      AB      Living  5     SAB      TAB      Ectopic      Multiple  0   Live Births  4           Family History  Problem Relation Age of Onset  . Diabetes Mother   . Hypertension Mother   . Hyperlipidemia Father   . Diabetes Father   . Hypertension Father   . Heart disease Sister   . Diabetes Brother   . Hyperlipidemia Brother   . Drug abuse Paternal Grandmother   . Kidney disease Other   . Birth defects Other        anal atresia/stenosis    Social History   Tobacco Use  . Smoking status: Never Smoker  . Smokeless tobacco: Never Used  Vaping Use  . Vaping Use: Never used  Substance Use Topics  . Alcohol use: Yes    Alcohol/week: 0.0 standard drinks    Comment: Occasionally.  . Drug use: No    Home Medications Prior to Admission medications   Medication Sig Start Date End Date Taking? Authorizing Provider  citalopram (CELEXA) 20 MG tablet Take 1 tablet (20 mg total) by mouth daily. 01/30/20 01/29/21 Yes Mosetta Anis, MD  gabapentin (NEURONTIN) 300 MG capsule Take 300 mg by mouth daily. 01/27/20  Yes [provider]  olopatadine (PATANOL) 0.1 % ophthalmic solution Place 1 drop into both eyes daily. 01/24/20  Yes [provider]  zonisamide (ZONEGRAN) 100 MG capsule Take 300 mg by mouth daily. 08/27/19  Yes [provider]  hydrOXYzine (ATARAX/VISTARIL) 25 MG tablet Take 1 tablet (25 mg total) by mouth every 6 (six) hours. 02/04/20   Marquett Bertoli A, PA-C    Allergies    Latex  Review of Systems   Review of Systems  Constitutional: Negative for activity change, chills and fever.  HENT: Negative for  congestion and sore throat.   Eyes: Negative for visual disturbance.  Respiratory: Positive for shortness of breath. Negative for cough, chest tightness and wheezing.   Cardiovascular: Negative for chest pain.  Gastrointestinal: Positive for nausea. Negative for abdominal pain, constipation, diarrhea and vomiting.  Genitourinary: Negative for dysuria, flank pain, hematuria, urgency and vaginal discharge.  Musculoskeletal: Positive for back pain. Negative for joint swelling, myalgias, neck pain and neck stiffness.  Skin: Negative for rash.  Allergic/Immunologic: Negative for immunocompromised state.  Neurological: Positive for headaches. Negative for dizziness, tremors, seizures, syncope, facial asymmetry, weakness, light-headedness and numbness.  Psychiatric/Behavioral: Negative for confusion.    Physical Exam Updated Vital Signs BP 105/80   Pulse 65   Temp 98.4 F (36.9 C) (Oral)   Resp 17   Ht 5\' 5"  (1.651 m)   Wt 88 kg   LMP 12/04/2019   SpO2 99%   BMI 32.28 kg/m   Physical Exam Vitals and nursing note reviewed.  Constitutional:      General: She is not in acute distress.    Appearance: She is not toxic-appearing.  HENT:     Head: Normocephalic.  Eyes:     Conjunctiva/sclera: Conjunctivae normal.  Cardiovascular:     Rate and Rhythm: Normal rate and regular rhythm.     Heart sounds: No murmur heard.  No friction rub. No gallop.   Pulmonary:     Effort: Pulmonary effort is normal. No respiratory distress.     Breath sounds: No stridor. No wheezing, rhonchi or rales.     Comments: Lungs are clear to auscultation bilaterally.  No increased work of breathing. Chest:     Chest wall: No tenderness.  Abdominal:     General: There is no distension.     Palpations: Abdomen is soft. There is no mass.     Tenderness: There is no abdominal tenderness. There is no right CVA tenderness, left CVA tenderness, guarding or rebound.     Hernia: No hernia is present.    Musculoskeletal:        General: No tenderness.     Cervical back: Neck supple.     Right lower leg: No edema.     Left lower leg: No edema.     Comments: No tenderness to the spinous processes or paraspinal muscles of the cervical, thoracic, or lumbar spine.  Skin:    General: Skin is warm.     Findings: No rash.  Neurological:     General: No focal deficit present.     Mental Status: She is alert.  Psychiatric:        Mood and Affect: Mood is anxious and depressed. Affect is tearful.        Speech: Speech normal.        Behavior: Behavior is slowed and withdrawn.        Thought Content: Thought content does not include homicidal or suicidal ideation. Thought content does not include homicidal or suicidal plan.     Comments: Very anxious appearing.  She is tearful on exam.     ED Results / Procedures / Treatments   Labs (all labs ordered are listed, but only abnormal results are displayed) Labs Reviewed  CBC  BASIC METABOLIC PANEL  I-STAT BETA HCG BLOOD, ED ([facility], [facility], [facility] ONLY)  TROPONIN I (HIGH SENSITIVITY)  TROPONIN I (HIGH SENSITIVITY)    EKG None  Radiology DG Chest 2 View  Result Date: 02/03/2020 CLINICAL DATA:  Left-sided back pain EXAM: CHEST - 2 VIEW COMPARISON:  12/11/2017, CT 09/25/2019 FINDINGS: The heart size and mediastinal contours are within normal limits. Both lungs are clear. The visualized skeletal structures are unremarkable. IMPRESSION: No active cardiopulmonary disease. Electronically Signed   By: Donavan Foil M.D.   On: 02/03/2020 23:23    Procedures Procedures (including critical care time)  Medications Ordered in ED Medications  LORazepam (ATIVAN) injection 0.5 mg (0.5 mg Intravenous Given 02/03/20 2339)  ketorolac (TORADOL) 30 MG/ML injection 30 mg (30 mg Intravenous Given  02/04/20 0136)    ED Course  I have reviewed the triage vital signs and the nursing notes.  Pertinent labs & imaging results that were available during my care of the  patient were reviewed by me and considered in my medical decision making (see chart for details).    MDM Rules/Calculators/A&P                          49 year old female with a h/o of provoked peripartum PE, asthma, cerebral aneurysm, internal carotid aneurysms, gestational diabetes, GERD, intramural leiomyoma of uterus, anxiety with somatic features presenting with left upper back pressure, shortness of breath, headache, and pain in her bilateral arms and legs.  Vital signs are normal.  Physical exam is overall reassuring.  No neurologic deficits.  She is very tearful and anxious appearing.  The patient's chart has been reviewed.  There is initial reports of chest pain, but patient adamantly denies chest pain on my evaluation.  However, given her thoracic back pain, EKG was obtained and independently reviewed and interpreted by me.  EKG unchanged from previous.  Labs and imaging reviewed independently interpreted by me.  Troponin is not elevated.  No metabolic derangements.  Pregnancy test is negative.  CBC is within normal limits.  Chest x-ray is unremarkable.  I have a low suspicion that patient's left thoracic back pressure is ACS, cholecystitis, PE, aortic dissection, COVID-19, tension pneumothorax.   Patient is very anxious appearing.  Symptoms have significantly worsened over the last few days after conversations with close family and friends.  Her symptoms that she presented with today suddenly worsens after she mentioned to her husband that she had a headache and he commented that her headache could be her aneurysm.  Aneurysms have been followed by neurosurgery at [facility] and have been stable.  She has no neurologic deficits today.  I have a low suspicion for ruptured carotid or cerebral aneurysm.  I strongly suspect the patient's symptoms are related to anxiety since she does have a history of anxiety with somatic features.  Her headache has been treated with Toradol in the ER.   Patient also notes that her back pain, shortness of breath, and myalgias have significantly improved with Ativan.  Given that patient's anxiety has significantly worsened over the last week and is starting to impact multiple areas of her life, I had a shared decision-making conversation with the patient and her husband regarding behavioral health evaluation while she was in the ER.  Patient declined at this time as she has an appointment with psychology in the morning.  She has been trying to schedule follow-up appointment and reports that the psychologist has significantly helped her previously.  She would like to keep the appointment.  She would like to be discharged with something for anxiety and discussed that she can be trialed on a dose of hydroxyzine.  All questions answered.  She was strongly encouraged to keep follow-up appointment with psychology.  Patient has no SI or HI at this time.   ER return precautions given.  She is hemodynamically stable to no acute distress.  Safe for discharge to home with outpatient follow-up as indicated.   Final Clinical Impression(s) / ED Diagnoses Final diagnoses:  Anxiety with somatic features    Rx / DC Orders ED Discharge Orders         Ordered    hydrOXYzine (ATARAX/VISTARIL) 25 MG tablet  Every 6 hours  02/04/20 0130           Joanne Gavel, PA-C 02/04/20 0424    Margette Fast, MD 02/06/20 [phone]

## 2020-02-03 NOTE — ED Triage Notes (Signed)
Pt arrives ems from home due to chest pain. Pt reports right side chest pressure that started today. Pt also reports weakness numbness/tingling in legs and hands x 2days.

## 2020-02-04 ENCOUNTER — Ambulatory Visit (INDEPENDENT_AMBULATORY_CARE_PROVIDER_SITE_OTHER): Payer: BC Managed Care – PPO | Admitting: Psychology

## 2020-02-04 ENCOUNTER — Telehealth: Payer: Self-pay | Admitting: *Deleted

## 2020-02-04 DIAGNOSIS — F411 Generalized anxiety disorder: Secondary | ICD-10-CM

## 2020-02-04 LAB — BASIC METABOLIC PANEL
Anion gap: 11 (ref 5–15)
BUN: 13 mg/dL (ref 6–20)
CO2: 24 mmol/L (ref 22–32)
Calcium: 9.1 mg/dL (ref 8.9–10.3)
Chloride: 103 mmol/L (ref 98–111)
Creatinine, Ser: 0.56 mg/dL (ref 0.44–1.00)
GFR, Estimated: >60 mL/min (ref >60)
Glucose, Bld: 95 mg/dL (ref 70–99)
Potassium: 3.6 mmol/L (ref 3.5–5.1)
Sodium: 138 mmol/L (ref 135–145)

## 2020-02-04 LAB — I-STAT BETA HCG BLOOD, ED (MC, WL, AP ONLY): I-stat hCG, quantitative: <5 m[IU]/mL (ref <5)

## 2020-02-04 LAB — TROPONIN I (HIGH SENSITIVITY): Troponin I (High Sensitivity): 3 ng/L (ref <18)

## 2020-02-04 MED ORDER — KETOROLAC TROMETHAMINE 30 MG/ML IJ SOLN
30.0000 mg | Freq: Once | INTRAMUSCULAR | Status: AC
  Administered 2020-02-04: 30 mg via INTRAVENOUS
  Filled 2020-02-04: qty 1

## 2020-02-04 MED ORDER — HYDROXYZINE HCL 25 MG PO TABS: 25.0000 mg | ORAL_TABLET | Freq: Four times a day (QID) | ORAL | 0 refills | Status: DC

## 2020-02-04 NOTE — Discharge Instructions (Addendum)
Gracias por permitirme at[facility] [employer] [facility].  Por favor, asista a su cita maana con psicologa.  Puede intentar tomar 1 tableta de hidroxicina cada 6 horas segn sea necesario para la ansiedad. Siga de cerca con atencin primaria si este medicamento no mejora significativamente sus sntomas.  Regrese al departamento de emergencias si presenta una nueva debilidad, dificultad para hablar, declive facial, si se desmaya, si presenta dificultad respiratoria, dolor en el pecho con sudoracin significativa u otros sntomas nuevos que le preocupen.  Thank you for allowing me to care for you today in the Emergency Department.   Please keep your appointment tomorrow with psychology.  You can try taking 1 tablet of hydroxyzine every 6 hours as needed for anxiety.  Please follow closely with primary care if this medication does not significantly improve your symptoms.  Return to the emergency department if you develop new weakness, slurred speech, facial droop, if you pass out, if you develop respiratory distress, chest pain with significant sweating, or other new, concerning symptoms.

## 2020-02-04 NOTE — Telephone Encounter (Signed)
Spoke with patient through interpretor.  Patient stated she has another provider outside of Primary Care at [facility]

## 2020-02-12 ENCOUNTER — Other Ambulatory Visit: Payer: Self-pay

## 2020-02-12 ENCOUNTER — Ambulatory Visit (INDEPENDENT_AMBULATORY_CARE_PROVIDER_SITE_OTHER): Payer: BC Managed Care – PPO | Admitting: Psychology

## 2020-02-12 ENCOUNTER — Encounter (HOSPITAL_BASED_OUTPATIENT_CLINIC_OR_DEPARTMENT_OTHER): Payer: Self-pay | Admitting: *Deleted

## 2020-02-12 ENCOUNTER — Emergency Department (HOSPITAL_BASED_OUTPATIENT_CLINIC_OR_DEPARTMENT_OTHER): Payer: BC Managed Care – PPO

## 2020-02-12 ENCOUNTER — Encounter (HOSPITAL_COMMUNITY): Payer: Self-pay

## 2020-02-12 ENCOUNTER — Emergency Department (HOSPITAL_BASED_OUTPATIENT_CLINIC_OR_DEPARTMENT_OTHER)
Admission: EM | Admit: 2020-02-12 | Discharge: 2020-02-12 | Disposition: A | Discharge status: Home / Self Care | Payer: BC Managed Care – PPO | Source: Home / Self Care | Attending: Emergency Medicine | Admitting: Emergency Medicine

## 2020-02-12 ENCOUNTER — Emergency Department (HOSPITAL_COMMUNITY)
Admission: EM | Admit: 2020-02-12 | Discharge: 2020-02-12 | Disposition: A | Discharge status: Home / Self Care | Payer: BC Managed Care – PPO | Attending: Emergency Medicine | Admitting: Emergency Medicine

## 2020-02-12 DIAGNOSIS — R519 Headache, unspecified: Secondary | ICD-10-CM | POA: Insufficient documentation

## 2020-02-12 DIAGNOSIS — F411 Generalized anxiety disorder: Secondary | ICD-10-CM | POA: Diagnosis not present

## 2020-02-12 DIAGNOSIS — M25532 Pain in left wrist: Secondary | ICD-10-CM | POA: Insufficient documentation

## 2020-02-12 DIAGNOSIS — M5459 Other low back pain: Secondary | ICD-10-CM | POA: Diagnosis not present

## 2020-02-12 DIAGNOSIS — Z91040 Latex allergy status: Secondary | ICD-10-CM | POA: Insufficient documentation

## 2020-02-12 DIAGNOSIS — J341 Cyst and mucocele of nose and nasal sinus: Secondary | ICD-10-CM | POA: Diagnosis not present

## 2020-02-12 DIAGNOSIS — Z5321 Procedure and treatment not carried out due to patient leaving prior to being seen by health care provider: Secondary | ICD-10-CM | POA: Insufficient documentation

## 2020-02-12 DIAGNOSIS — M545 Low back pain, unspecified: Secondary | ICD-10-CM | POA: Insufficient documentation

## 2020-02-12 DIAGNOSIS — M79602 Pain in left arm: Secondary | ICD-10-CM | POA: Insufficient documentation

## 2020-02-12 DIAGNOSIS — M47816 Spondylosis without myelopathy or radiculopathy, lumbar region: Secondary | ICD-10-CM | POA: Diagnosis not present

## 2020-02-12 DIAGNOSIS — R079 Chest pain, unspecified: Secondary | ICD-10-CM | POA: Diagnosis not present

## 2020-02-12 DIAGNOSIS — Z859 Personal history of malignant neoplasm, unspecified: Secondary | ICD-10-CM | POA: Insufficient documentation

## 2020-02-12 DIAGNOSIS — M7918 Myalgia, other site: Secondary | ICD-10-CM

## 2020-02-12 DIAGNOSIS — M25512 Pain in left shoulder: Secondary | ICD-10-CM | POA: Diagnosis not present

## 2020-02-12 DIAGNOSIS — M47817 Spondylosis without myelopathy or radiculopathy, lumbosacral region: Secondary | ICD-10-CM | POA: Diagnosis not present

## 2020-02-12 DIAGNOSIS — J45909 Unspecified asthma, uncomplicated: Secondary | ICD-10-CM | POA: Insufficient documentation

## 2020-02-12 DIAGNOSIS — Y9389 Activity, other specified: Secondary | ICD-10-CM | POA: Diagnosis not present

## 2020-02-12 DIAGNOSIS — M25562 Pain in left knee: Secondary | ICD-10-CM | POA: Insufficient documentation

## 2020-02-12 DIAGNOSIS — R112 Nausea with vomiting, unspecified: Secondary | ICD-10-CM | POA: Insufficient documentation

## 2020-02-12 DIAGNOSIS — Y92410 Unspecified street and highway as the place of occurrence of the external cause: Secondary | ICD-10-CM | POA: Insufficient documentation

## 2020-02-12 DIAGNOSIS — M795 Residual foreign body in soft tissue: Secondary | ICD-10-CM | POA: Diagnosis not present

## 2020-02-12 DIAGNOSIS — M542 Cervicalgia: Secondary | ICD-10-CM | POA: Insufficient documentation

## 2020-02-12 DIAGNOSIS — S3992XA Unspecified injury of lower back, initial encounter: Secondary | ICD-10-CM | POA: Diagnosis not present

## 2020-02-12 MED ORDER — MORPHINE SULFATE (PF) 4 MG/ML IV SOLN
4.0000 mg | Freq: Once | INTRAVENOUS | Status: AC
  Administered 2020-02-12: 4 mg via INTRAVENOUS
  Filled 2020-02-12: qty 1

## 2020-02-12 MED ORDER — NAPROXEN 500 MG PO TABS: 500.0000 mg | ORAL_TABLET | Freq: Two times a day (BID) | ORAL | 0 refills | Status: DC

## 2020-02-12 MED ORDER — METHOCARBAMOL 500 MG PO TABS: 500.0000 mg | ORAL_TABLET | Freq: Two times a day (BID) | ORAL | 0 refills | Status: DC

## 2020-02-12 MED ORDER — METHOCARBAMOL 500 MG PO TABS
500.0000 mg | ORAL_TABLET | Freq: Once | ORAL | Status: AC
  Administered 2020-02-12: 500 mg via ORAL
  Filled 2020-02-12: qty 1
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
## 2020-02-12 NOTE — ED Notes (Signed)
Pt given ice pack for lt shoulder and lt fore arm
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
## 2020-02-14 ENCOUNTER — Other Ambulatory Visit: Payer: Self-pay | Admitting: Student

## 2020-02-14 DIAGNOSIS — R071 Chest pain on breathing: Secondary | ICD-10-CM

## 2020-02-18 ENCOUNTER — Ambulatory Visit (INDEPENDENT_AMBULATORY_CARE_PROVIDER_SITE_OTHER): Payer: BC Managed Care – PPO | Admitting: Psychology

## 2020-02-18 DIAGNOSIS — F331 Major depressive disorder, recurrent, moderate: Secondary | ICD-10-CM

## 2020-02-24 ENCOUNTER — Ambulatory Visit (INDEPENDENT_AMBULATORY_CARE_PROVIDER_SITE_OTHER): Payer: BC Managed Care – PPO | Admitting: Psychology

## 2020-02-24 DIAGNOSIS — F4323 Adjustment disorder with mixed anxiety and depressed mood: Secondary | ICD-10-CM

## 2020-02-26 ENCOUNTER — Encounter: Payer: Self-pay | Admitting: Internal Medicine

## 2020-02-26 ENCOUNTER — Other Ambulatory Visit: Payer: Self-pay

## 2020-02-26 ENCOUNTER — Ambulatory Visit (INDEPENDENT_AMBULATORY_CARE_PROVIDER_SITE_OTHER): Payer: BC Managed Care – PPO | Admitting: Internal Medicine

## 2020-02-26 VITALS — BP 122/76 | HR 70 | Temp 97.8°F | Ht 65.0 in | Wt 197.6 lb

## 2020-02-26 DIAGNOSIS — F064 Anxiety disorder due to known physiological condition: Secondary | ICD-10-CM

## 2020-02-26 DIAGNOSIS — F410 Panic disorder [episodic paroxysmal anxiety] without agoraphobia: Secondary | ICD-10-CM

## 2020-02-26 MED ORDER — HYDROXYZINE HCL 25 MG PO TABS: ORAL_TABLET | ORAL | 0 refills | Status: DC

## 2020-02-26 MED ORDER — CITALOPRAM HYDROBROMIDE 40 MG PO TABS: ORAL_TABLET | ORAL | 2 refills | Status: DC

## 2020-02-26 NOTE — Patient Instructions (Addendum)
It was nice seeing you today! Thank you for choosing [facility] for your Primary Care.    Today we talked about:   1. Anxiety and Depression: We will continue working together toward improving your anxiety levels and depression. Today we made some medication changes:    Increased dose of Citalopram (Celexa) to 40 mg. This is a medication that you need to take everyday.    Added Hydroxyzine (Atarax). This is a medication you only take as needed. This is not to take around the  clock. When your anxiety is severe or/and you feel as though you may be having a panic attack, take 1 tablet  at that time.  ------------------------------------------------------------------------------- Alm [place] un placer verte hoy! Gracias por elegir [facility] para su atencin primaria.   Hoy hablamos de:  1. Ansiedad y depresin: Seguiremos trabajando juntos para mejorar sus niveles de ansiedad y depresin. Hoy hicimos algunos cambios de medicacin:   Aumento de la dosis de Citalopram (Celexa) a 40 mg. Este es un medicamento que debe tomar todos  [place].   Hidroxicina aadida (Atarax). Este es un medicamento que solo toma cuando lo necesita. Esto no es para  tomarlo [place]. Cuando su ansiedad sea severa y / o sienta que puede estar teniendo un ataque de  pnico, tome 1 tableta en ese momento.

## 2020-02-26 NOTE — Progress Notes (Signed)
   CC: Anxiety  HPI:  [name] is a 49 y.o. with a PMHx as listed below who presents to the clinic for anxiety.   Please see the Encounters tab for problem-based Assessment & Plan regarding status of patient's acute and chronic conditions.  Past Medical History:  Diagnosis Date  . Abnormal Pap smear    patient states she had cancer that was removed from her cervix  . Asthma    when pregnant/ once 6 yrs ago only time asthma attack the dr said  . Cancer ([place])   . Clotting disorder ([place])   . Gestational diabetes   . Intramural leiomyoma of uterus 02/29/2016  . Pulmonary embolism ([place])   . Renal insufficiency   . Vitamin D deficiency   . Vitamin D deficiency    Review of Systems: Review of Systems  Neurological: Negative for dizziness and headaches.  Psychiatric/Behavioral: Positive for depression. Negative for hallucinations, memory loss, substance abuse and suicidal ideas. The patient is nervous/anxious. The patient does not have insomnia.    Physical Exam:  Vitals:   02/26/20 1532  BP: 122/76  Pulse: 70  Temp: 97.8 F (36.6 C)  TempSrc: Oral  SpO2: 99%  Weight: 197 lb 9.6 oz (89.6 kg)    Physical Exam Vitals and nursing note reviewed.  Constitutional:      General: She is not in acute distress.    Appearance: She is normal weight.  Neurological:     General: No focal deficit present.     Mental Status: She is alert and oriented to person, place, and time. Mental status is at baseline.  Psychiatric:        Attention and Perception: Attention and perception normal.        Mood and Affect: Mood is anxious. Affect is tearful.        Speech: Speech is tangential.        Behavior: Behavior normal. Behavior is cooperative.        Thought Content: Thought content normal.        Cognition and Memory: Cognition and memory normal.    Assessment & Plan:   See Encounters Tab for problem based charting.  Patient discussed with Dr. Jimmye Norman

## 2020-02-28 NOTE — Progress Notes (Signed)
Internal Medicine Clinic Attending  Case discussed with Dr. Basaraba  At the time of the visit.  We reviewed the resident's history and exam and pertinent patient test results.  I agree with the assessment, diagnosis, and plan of care documented in the resident's note.  
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
## 2020-03-02 ENCOUNTER — Ambulatory Visit (INDEPENDENT_AMBULATORY_CARE_PROVIDER_SITE_OTHER): Payer: BC Managed Care – PPO | Admitting: Psychology

## 2020-03-02 DIAGNOSIS — F411 Generalized anxiety disorder: Secondary | ICD-10-CM | POA: Diagnosis not present

## 2020-03-04 DIAGNOSIS — K648 Other hemorrhoids: Secondary | ICD-10-CM | POA: Diagnosis not present

## 2020-03-06 ENCOUNTER — Other Ambulatory Visit: Payer: Self-pay | Admitting: Internal Medicine

## 2020-03-06 DIAGNOSIS — F064 Anxiety disorder due to known physiological condition: Secondary | ICD-10-CM

## 2020-03-09 DIAGNOSIS — G44329 Chronic post-traumatic headache, not intractable: Secondary | ICD-10-CM | POA: Diagnosis not present

## 2020-03-09 DIAGNOSIS — M542 Cervicalgia: Secondary | ICD-10-CM | POA: Diagnosis not present

## 2020-03-16 ENCOUNTER — Ambulatory Visit (INDEPENDENT_AMBULATORY_CARE_PROVIDER_SITE_OTHER): Payer: BC Managed Care – PPO | Admitting: Psychology

## 2020-03-16 DIAGNOSIS — F331 Major depressive disorder, recurrent, moderate: Secondary | ICD-10-CM

## 2020-03-21 ENCOUNTER — Other Ambulatory Visit: Payer: Self-pay | Admitting: Internal Medicine

## 2020-03-21 DIAGNOSIS — F064 Anxiety disorder due to known physiological condition: Secondary | ICD-10-CM

## 2020-03-30 ENCOUNTER — Ambulatory Visit (INDEPENDENT_AMBULATORY_CARE_PROVIDER_SITE_OTHER): Payer: BC Managed Care – PPO | Admitting: Psychology

## 2020-03-30 DIAGNOSIS — F331 Major depressive disorder, recurrent, moderate: Secondary | ICD-10-CM

## 2020-03-31 ENCOUNTER — Other Ambulatory Visit: Payer: Self-pay | Admitting: Student

## 2020-03-31 DIAGNOSIS — F410 Panic disorder [episodic paroxysmal anxiety] without agoraphobia: Secondary | ICD-10-CM

## 2020-03-31 DIAGNOSIS — F064 Anxiety disorder due to known physiological condition: Secondary | ICD-10-CM

## 2020-04-13 ENCOUNTER — Ambulatory Visit: Payer: BC Managed Care – PPO | Admitting: Psychology

## 2020-04-23 ENCOUNTER — Ambulatory Visit: Payer: BC Managed Care – PPO | Admitting: Psychology

## 2020-04-27 ENCOUNTER — Ambulatory Visit (INDEPENDENT_AMBULATORY_CARE_PROVIDER_SITE_OTHER): Payer: BC Managed Care – PPO | Admitting: Psychology

## 2020-04-27 ENCOUNTER — Other Ambulatory Visit: Payer: Self-pay | Admitting: Student

## 2020-04-27 DIAGNOSIS — M542 Cervicalgia: Secondary | ICD-10-CM | POA: Diagnosis not present

## 2020-04-27 DIAGNOSIS — G44329 Chronic post-traumatic headache, not intractable: Secondary | ICD-10-CM | POA: Diagnosis not present

## 2020-04-27 DIAGNOSIS — F411 Generalized anxiety disorder: Secondary | ICD-10-CM | POA: Diagnosis not present

## 2020-04-27 DIAGNOSIS — F064 Anxiety disorder due to known physiological condition: Secondary | ICD-10-CM

## 2020-04-30 ENCOUNTER — Other Ambulatory Visit: Payer: Self-pay | Admitting: Student

## 2020-04-30 DIAGNOSIS — F064 Anxiety disorder due to known physiological condition: Secondary | ICD-10-CM

## 2020-04-30 DIAGNOSIS — F410 Panic disorder [episodic paroxysmal anxiety] without agoraphobia: Secondary | ICD-10-CM

## 2020-05-11 ENCOUNTER — Ambulatory Visit (INDEPENDENT_AMBULATORY_CARE_PROVIDER_SITE_OTHER): Payer: BC Managed Care – PPO | Admitting: Psychology

## 2020-05-11 DIAGNOSIS — F411 Generalized anxiety disorder: Secondary | ICD-10-CM | POA: Diagnosis not present

## 2020-05-25 ENCOUNTER — Ambulatory Visit: Payer: BC Managed Care – PPO | Admitting: Psychology

## 2020-06-02 ENCOUNTER — Other Ambulatory Visit: Payer: Self-pay | Admitting: Internal Medicine

## 2020-06-02 DIAGNOSIS — F064 Anxiety disorder due to known physiological condition: Secondary | ICD-10-CM

## 2020-06-02 NOTE — Telephone Encounter (Signed)
Needs follow-up

## 2020-06-03 NOTE — Telephone Encounter (Signed)
A letter was mailed to the patient as we have not been able to contact her by phone.

## 2020-06-08 ENCOUNTER — Ambulatory Visit: Payer: BC Managed Care – PPO | Admitting: Psychology

## 2020-06-15 DIAGNOSIS — G44329 Chronic post-traumatic headache, not intractable: Secondary | ICD-10-CM | POA: Diagnosis not present

## 2020-06-15 DIAGNOSIS — M542 Cervicalgia: Secondary | ICD-10-CM | POA: Diagnosis not present

## 2020-06-22 ENCOUNTER — Ambulatory Visit (INDEPENDENT_AMBULATORY_CARE_PROVIDER_SITE_OTHER): Payer: BC Managed Care – PPO | Admitting: Psychology

## 2020-06-22 DIAGNOSIS — F330 Major depressive disorder, recurrent, mild: Secondary | ICD-10-CM | POA: Diagnosis not present

## 2020-06-25 ENCOUNTER — Encounter: Payer: BC Managed Care – PPO | Admitting: Student

## 2020-07-06 ENCOUNTER — Ambulatory Visit: Payer: BC Managed Care – PPO | Admitting: Psychology

## 2020-07-10 ENCOUNTER — Telehealth: Payer: Self-pay | Admitting: *Deleted

## 2020-07-10 NOTE — Telephone Encounter (Signed)
Fax from [facility] pharmacy for refill on Hydroxyzine 25 mg . Not on current med list. It was refilled and discontinued on 06/02/20.  Please review.

## 2020-07-10 NOTE — Telephone Encounter (Signed)
Patient will prescribe hydroxyzine as needed by Dr. Charleen Kirks last November.  It looks like Dr. Eileen Stanford discontinued this medication in 2/15/122 but unsure of the reason.  I tried to call patient to assess her anxiety but unsuccessful attempt to reach patient via phone.

## 2020-07-20 ENCOUNTER — Ambulatory Visit: Payer: BC Managed Care – PPO | Admitting: Psychology

## 2020-07-28 ENCOUNTER — Encounter: Payer: Self-pay | Admitting: Internal Medicine

## 2020-08-03 ENCOUNTER — Ambulatory Visit: Payer: BC Managed Care – PPO | Admitting: Psychology

## 2020-08-17 ENCOUNTER — Ambulatory Visit: Payer: BC Managed Care – PPO | Admitting: Psychology

## 2020-09-05 ENCOUNTER — Encounter: Payer: Self-pay | Admitting: *Deleted

## 2020-09-15 DIAGNOSIS — G44329 Chronic post-traumatic headache, not intractable: Secondary | ICD-10-CM | POA: Diagnosis not present

## 2020-09-15 DIAGNOSIS — M542 Cervicalgia: Secondary | ICD-10-CM | POA: Diagnosis not present

## 2020-12-23 ENCOUNTER — Encounter: Payer: Self-pay | Admitting: Obstetrics & Gynecology

## 2020-12-23 ENCOUNTER — Ambulatory Visit: Payer: BC Managed Care – PPO | Admitting: Obstetrics & Gynecology

## 2020-12-23 ENCOUNTER — Other Ambulatory Visit: Payer: Self-pay

## 2020-12-23 ENCOUNTER — Telehealth: Payer: Self-pay | Admitting: *Deleted

## 2020-12-23 VITALS — BP 120/80 | Temp 98.2°F | Resp 16

## 2020-12-23 DIAGNOSIS — M25541 Pain in joints of right hand: Secondary | ICD-10-CM | POA: Diagnosis not present

## 2020-12-23 DIAGNOSIS — Z780 Asymptomatic menopausal state: Secondary | ICD-10-CM | POA: Diagnosis not present

## 2020-12-23 DIAGNOSIS — R1032 Left lower quadrant pain: Secondary | ICD-10-CM

## 2020-12-23 DIAGNOSIS — M25542 Pain in joints of left hand: Secondary | ICD-10-CM

## 2020-12-23 LAB — FOLLICLE STIMULATING HORMONE: FSH: 33.9 m[IU]/mL

## 2020-12-23 LAB — TSH: TSH: 3.08 mIU/L

## 2020-12-23 NOTE — Telephone Encounter (Signed)
Referral placed. That office will contact patient for appointment

## 2020-12-23 NOTE — Progress Notes (Signed)
    [place] 07/21/1970 [id number]        50 y.o.  [id number]   RP: Intermittent left pelvic pain  HPI: Intermittent left pelvic pain with a sensation of being bloated.  No menstrual period for about a year.  Occasional hot flushes.  No urinary tract infection symptoms.  No vaginal discharge.  No fever.  Complains of bone pains especially at the finger joints and at the jaw.   OB History  Gravida Para Term Preterm AB Living  '5 5 5     5  '$ SAB IAB Ectopic Multiple Live Births        0 4    # Outcome Date GA Lbr Len/2nd Weight Sex Delivery Anes PTL Lv  5 Term 11/04/14 [redacted]w[redacted]d 7 lb 8.5 oz (3.416 kg) F Vag-Spont EPI  LIV  4 Term 03/18/08 428w0d  Vag-Spont  N LIV  3 Term 11/07/00 [id number] Vag-Spont  N LIV  2 Term 07/09/90 [id number]-Spont  N LIV  1 Term 08/24/89 [id number]-Spont       Past medical history,surgical history, problem list, medications, allergies, family history and social history were all reviewed and documented in the EPIC chart.   Directed ROS with pertinent positives and negatives documented in the history of present illness/assessment and plan.  Exam:  Vitals:   12/23/20 1034  BP: 120/80  Resp: 16  Temp: 98.2 F (36.8 C)  TempSrc: Oral   General appearance:  Normal  Abdomen: Normal  Gynecologic exam: Normal vulva.  Bimanual exam:  Uterus AV, normal volume, NT, mobile.  No adnexal mass, NT bilaterally.  U/A: Yellow, slightly cloudy, protein negative, nitrites negative, white blood cells 0-5, red blood cells negative, bacteria few.  Urine culture pending.   Assessment/Plan:  49 y.24 [id number] Intermittent left lower quadrant abdominal pain Intermittent left pelvic pain with a feeling of being bloated.  Urine analysis very mildly perturbed, will wait on urine culture to decide if treatment is needed.  F/U with a Pelvic [place]/o left ovarian pathology.  Possible GI origin. - Urinalysis,Complete w/RFL Culture - US TrKoreasvaginal Non-OB;  Future  2. Menopause Amenorrhea x about 1 year.  Hot flushes present. - FSH - TSH  3. Arthralgia of both hands  Referred to Rheumatologist.  MariePrincess Bruins10:43 AM 12/23/2020

## 2020-12-23 NOTE — Telephone Encounter (Signed)
-----   Message from Leron Croak, [place] sent at 12/23/2020 11:10 AM EDT ----- Regarding: FW: Refer to Rheumatologist Referral to rheumatologist  ----- Message ----- From: Princess Bruins, MD Sent: 12/23/2020  10:56 AM EDT To: [facility] Subject: Refer to Rheumatologist                        Bilateral joint pain at hands/fingers and jaw pain.

## 2020-12-26 LAB — URINALYSIS, COMPLETE W/RFL CULTURE
Bilirubin Urine: NEGATIVE
Glucose, UA: NEGATIVE
Hgb urine dipstick: NEGATIVE
Hyaline Cast: NONE SEEN /LPF
Ketones, ur: NEGATIVE
Leukocyte Esterase: NEGATIVE
Nitrites, Initial: NEGATIVE
Protein, ur: NEGATIVE
RBC / HPF: NONE SEEN /HPF (ref 0–2)
Specific Gravity, Urine: 1.02 (ref 1.001–1.035)
pH: 5.5 (ref 5.0–8.0)

## 2020-12-26 LAB — URINE CULTURE
MICRO NUMBER:: 12341153
SPECIMEN QUALITY:: ADEQUATE

## 2020-12-26 LAB — CULTURE INDICATED

## 2020-12-28 ENCOUNTER — Ambulatory Visit: Payer: BC Managed Care – PPO | Admitting: Internal Medicine

## 2020-12-28 ENCOUNTER — Encounter: Payer: Self-pay | Admitting: Internal Medicine

## 2020-12-28 ENCOUNTER — Other Ambulatory Visit: Payer: Self-pay

## 2020-12-28 VITALS — BP 116/74 | HR 53 | Temp 98.1°F | Ht 65.0 in | Wt 192.7 lb

## 2020-12-28 DIAGNOSIS — R6884 Jaw pain: Secondary | ICD-10-CM

## 2020-12-28 DIAGNOSIS — Z23 Encounter for immunization: Secondary | ICD-10-CM | POA: Diagnosis not present

## 2020-12-28 DIAGNOSIS — S0340XA Sprain of jaw, unspecified side, initial encounter: Secondary | ICD-10-CM

## 2020-12-28 NOTE — Patient Instructions (Signed)
Ferron, it was a pleasure seeing you today!  Today we discussed: Jaw pain, headache, hand pain. I have ordered some blood tests for further evaluation. I will call you tomorrow when the results return.   Please make sure to arrive 15 minutes prior to your next appointment. If you arrive late, you may be asked to reschedule.   We look forward to seeing you next time. Please call our clinic at [phone] if you have any questions or concerns. The best time to call is Monday-Friday from 9am-4pm, but there is someone available 24/7. If after hours or the weekend, call the main hospital number and ask for the Internal Medicine Resident On-Call. If you need medication refills, please notify your pharmacy one week in advance and they will send [place] a request.  Thank you for letting us take part in your care. Wishing you the best!

## 2020-12-29 ENCOUNTER — Encounter: Payer: BC Managed Care – PPO | Admitting: Internal Medicine

## 2020-12-29 DIAGNOSIS — S0340XA Sprain of jaw, unspecified side, initial encounter: Secondary | ICD-10-CM | POA: Insufficient documentation

## 2020-12-29 HISTORY — DX: Sprain of jaw, unspecified side, initial encounter: S03.40XA

## 2020-12-29 LAB — CBC WITH DIFFERENTIAL/PLATELET
Basophils Absolute: 0.1 10*3/uL (ref 0.0–0.2)
Basos: 1 %
EOS (ABSOLUTE): 0.2 10*3/uL (ref 0.0–0.4)
Eos: 2 %
Hematocrit: 43.9 % (ref 34.0–46.6)
Hemoglobin: 14.9 g/dL (ref 11.1–15.9)
Immature Grans (Abs): 0 10*3/uL (ref 0.0–0.1)
Immature Granulocytes: 0 %
Lymphocytes Absolute: 3.6 10*3/uL — ABNORMAL HIGH (ref 0.7–3.1)
Lymphs: 41 %
MCH: 31 pg (ref 26.6–33.0)
MCHC: 33.9 g/dL (ref 31.5–35.7)
MCV: 91 fL (ref 79–97)
Monocytes Absolute: 0.6 10*3/uL (ref 0.1–0.9)
Monocytes: 6 %
Neutrophils Absolute: 4.5 10*3/uL (ref 1.4–7.0)
Neutrophils: 50 %
Platelets: 303 10*3/uL (ref 150–450)
RBC: 4.81 x10E6/uL (ref 3.77–5.28)
RDW: 11.9 % (ref 11.7–15.4)
WBC: 9 10*3/uL (ref 3.4–10.8)

## 2020-12-29 LAB — BASIC METABOLIC PANEL
BUN/Creatinine Ratio: 20 (ref 9–23)
BUN: 11 mg/dL (ref 6–24)
CO2: 23 mmol/L (ref 20–29)
Calcium: 9.4 mg/dL (ref 8.7–10.2)
Chloride: 102 mmol/L (ref 96–106)
Creatinine, Ser: 0.54 mg/dL — ABNORMAL LOW (ref 0.57–1.00)
Glucose: 81 mg/dL (ref 65–99)
Potassium: 4.2 mmol/L (ref 3.5–5.2)
Sodium: 142 mmol/L (ref 134–144)
eGFR: 113 mL/min/{1.73_m2} (ref >59)

## 2020-12-29 LAB — C-REACTIVE PROTEIN: CRP: 4 mg/L (ref 0–10)

## 2020-12-29 LAB — SEDIMENTATION RATE: Sed Rate: 9 mm/hr (ref 0–32)

## 2020-12-29 NOTE — Assessment & Plan Note (Addendum)
HPI  She presents for jaw pain and bilateral hand/wrist pain x8w. Jaw pain is right side predominant and is associated with headache and ear pain on the right. Pain is exacerbated by opening/closing her mouth. Denies vision changes. Endorses some diminished hearing on the right. Denies fevers, chills, night sweats. No recent URIs. No prior symptoms. No inciting event. She denies teeth grinding or gum chewing. Bilateral hand and wrist pain started around the same time as the jaw pain. Pain is diffuse in her hands and wrists. She is unable to localize to specific joints. She denies swelling or rash. No prior symptoms, no inciting events.  No known family history of autoimmune type diseases. Exam: pain with palpation over the temporal region and jaw bilaterally, R>L. Clicks appeciated bilaterally with jaw opening/closing.  Assessment Primary concern, at the time of her visit, was GCA. I checked ESR/CRP, both of which are normal, which, in combination with her age, make GCA unlikely. Question TMJ>headache and ear pain. Unsure about the correlation with the hand pain. Normal ESR/CRP make autoimmune process less likely. Arthritis? Plan  Recommend supportive therapy with ibuprofen and tylenol  Counseled on self-care measures including optimal head posture, jaw exercises, proper sleep hygiene, as well as avoidance of triggers (eg, oral behaviors such as nail biting, pen chewing)   Recommend evaluation by dentistry for consideration of splinting  She will follow up in our office in 2 months for the rest of her primary care needs. If symptoms persist at that time, would recommend referral to ENT  If hand pain persists, consider obtaining basic films 

## 2020-12-29 NOTE — Progress Notes (Signed)
   Office Visit   Patient ID: Kim [name], female    DOB: 1970/12/12, 50 y.o.   MRN: [id number]   PCP: Orvis Brill, MD   Subjective:  [facility] Kim [name] is a 50 y.o. year old female who presents for evaluation of jaw and bilateral hand pain. Please refer to problem based charting for assessment and plan.  Objective:   BP 116/74 (BP Location: Right Arm, Patient Position: Sitting, Cuff Size: Normal)   Pulse (!) 53   Temp 98.1 F (36.7 C) (Oral)   Ht $R'5\' 5"'kr$  (1.651 m)   Wt 192 lb 11.2 oz (87.4 kg)   SpO2 97%   BMI 32.07 kg/m  BP Readings from Last 3 Encounters:  12/28/20 116/74  12/23/20 120/80  02/26/20 122/76   General: well appearing, no distress HENT: pain with palpation over the temporal region and jaw bilaterally, R>L. Clicks appeciated bilaterally with jaw opening/closing. No temporal vessel palpated. No erythema or effusion of the right ear. Good dentition. Cardiac: RRR Skin: no rash on limited exam MSK: diffuse pain on palpation of the bilateral hands and wrists. No joint effusion or erythema.   Assessment & Plan:   Problem List Items Addressed This Visit       Musculoskeletal and Integument   TMJ (sprain of temporomandibular joint), initial encounter    HPI  She presents for jaw pain and bilateral hand/wrist pain x8w. Jaw pain is right side predominant and is associated with headache and ear pain on the right. Pain is exacerbated by opening/closing her mouth. Denies vision changes. Endorses some diminished hearing on the right. Denies fevers, chills, night sweats. No recent URIs. No prior symptoms. No inciting event. She denies teeth grinding or gum chewing. Bilateral hand and wrist pain started around the same time as the jaw pain. Pain is diffuse in her hands and wrists. She is unable to localize to specific joints. She denies swelling or rash. No prior symptoms, no inciting events.  No known family history of autoimmune type diseases. Exam: pain  with palpation over the temporal region and jaw bilaterally, R>L. Clicks appeciated bilaterally with jaw opening/closing.  Assessment Primary concern, at the time of her visit, was GCA. I checked ESR/CRP, both of which are normal, which, in combination with her age, make GCA unlikely. Question TMJ>headache and ear pain. Unsure about the correlation with the hand pain. Normal ESR/CRP make autoimmune process less likely. Arthritis? Plan Recommend supportive therapy with ibuprofen and tylenol Counseled on self-care measures including optimal head posture, jaw exercises, proper sleep hygiene, as well as avoidance of triggers (eg, oral behaviors such as nail biting, pen chewing)  Recommend evaluation by dentistry for consideration of splinting She will follow up in our office in 2 months for the rest of her primary care needs. If symptoms persist at that time, would recommend referral to ENT If hand pain persists, consider obtaining basic films        Other   Need for immunization against influenza - Primary   Relevant Orders   Flu Vaccine QUAD 50mo+IM (Fluarix, Fluzone & Alfiuria Quad PF) (Completed)     Return in about 2 months (around 02/27/2021) for routine follow up with Dr. Lorin Glass.   Pt discussed with Dr. Forde Radon, MD Internal Medicine Resident PGY-3 [facility] 12/29/2020 1:33 PM

## 2021-01-19 ENCOUNTER — Other Ambulatory Visit: Payer: BC Managed Care – PPO | Admitting: Obstetrics & Gynecology

## 2021-01-19 ENCOUNTER — Other Ambulatory Visit: Payer: BC Managed Care – PPO

## 2021-01-21 ENCOUNTER — Other Ambulatory Visit: Payer: Self-pay

## 2021-01-21 ENCOUNTER — Ambulatory Visit (INDEPENDENT_AMBULATORY_CARE_PROVIDER_SITE_OTHER): Payer: BC Managed Care – PPO

## 2021-01-21 ENCOUNTER — Ambulatory Visit: Payer: BC Managed Care – PPO | Admitting: Obstetrics & Gynecology

## 2021-01-21 ENCOUNTER — Encounter: Payer: Self-pay | Admitting: Obstetrics & Gynecology

## 2021-01-21 VITALS — BP 116/78

## 2021-01-21 DIAGNOSIS — R1032 Left lower quadrant pain: Secondary | ICD-10-CM

## 2021-01-21 DIAGNOSIS — R140 Abdominal distension (gaseous): Secondary | ICD-10-CM | POA: Diagnosis not present

## 2021-01-21 DIAGNOSIS — N9412 Deep dyspareunia: Secondary | ICD-10-CM

## 2021-01-21 NOTE — Progress Notes (Signed)
    Kim [name] 11/16/1970 [id number]        50 y.o.  [id number]   RP: Intermittent LLQ pain for Pelvic US  HPI: Feeling bloated intermittently with LLQ discomfort.  Postmenopausal well on no HRT. No PMB.  C/O deep dyspareunia.   OB History  Gravida Para Term Preterm AB Living  5 5 5     5   SAB IAB Ectopic Multiple Live Births        0 5    # Outcome Date GA Lbr Len/2nd Weight Sex Delivery Anes PTL Lv  5 Term 11/04/14 [redacted]w[redacted]d  7 lb 8.5 oz (3.416 kg) F Vag-Spont EPI  LIV  4 Term 03/18/08 [redacted]w[redacted]d    Vag-Spont  N LIV  3 Term 11/07/00 [redacted]w[redacted]d    Vag-Spont  N LIV  2 Term 07/09/90 [redacted]w[redacted]d    Vag-Spont  N LIV  1 Term 08/24/89 [redacted]w[redacted]d    Vag-Spont       Past medical history,surgical history, problem list, medications, allergies, family history and social history were all reviewed and documented in the EPIC chart.   Directed ROS with pertinent positives and negatives documented in the history of present illness/assessment and plan.  Exam:  Vitals:   01/21/21 0924  BP: 116/78   General appearance:  Normal  Pelvic US today: Non latex probe cover used.  T/V images.  Anteverted uterus normal in size and shape with 2 subserosal fibroids, largest measured at 2.6 cm posteriorly.  The overall uterine size is measured at 7.27 x 5.32 x 4.83 cm.  Thin symmetrical endometrial lining measured at 2.77 mm.  No mass or thickening or abnormal blood flow seen.  Both ovaries are small with atrophic appearance.  No adnexal mass.  No free fluid in the posterior cul-de-sac.   Assessment/Plan:  50 y.o. [id number]   1. Intermittent left lower quadrant abdominal pain  Pelvic ultrasound findings thoroughly reviewed with patient.  Left ovary normal with no adnexal mass.  No free fluid in the pelvis.  Uterus with only 2 small subserosal fibroids.  Patient reassured.  2. Deep dyspareunia Counseling done on dyspareunia.  Recommended using coconut oil with intercourse.  We will try changes in position and less  deep penetration.  3. Bloating  Left lower quadrant intermittent pain probably associated with intestinal gas causing bloating.  Will modify nutrition and increased walking to pass gas better.  Gastro referral as needed.  Princess Bruins MD, 9:49 AM 01/21/2021

## 2021-01-24 ENCOUNTER — Encounter: Payer: Self-pay | Admitting: Obstetrics & Gynecology

## 2021-02-02 NOTE — Progress Notes (Deleted)
Office Visit Note  Patient: Kim [name]             Date of Birth: October 30, 1970           MRN: [id number]             PCP: Kim Brill, MD Referring: Kim Bruins, MD Visit Date: 02/03/2021 Occupation: @GUAROCC @  Subjective:  No chief complaint on file.   History of Present Illness: [name] is a 50 y.o. female here for joint pain in bilateral hands and in her jaw.***   Labs reviewed 12/2020 ESR wnl CRP wnl  Activities of Daily Living:  Patient reports morning stiffness for *** {minute/hour:19697}.   Patient {ACTIONS;DENIES/REPORTS:21021675::"Denies"} nocturnal pain.  Difficulty dressing/grooming: {ACTIONS;DENIES/REPORTS:21021675::"Denies"} Difficulty climbing stairs: {ACTIONS;DENIES/REPORTS:21021675::"Denies"} Difficulty getting out of chair: {ACTIONS;DENIES/REPORTS:21021675::"Denies"} Difficulty using hands for taps, buttons, cutlery, and/or writing: {ACTIONS;DENIES/REPORTS:21021675::"Denies"}  No Rheumatology ROS completed.   PMFS History:  Patient Active Problem List   Diagnosis Date Noted   TMJ (sprain of temporomandibular joint), initial encounter 12/29/2020   Anxiety disorder due to general medical condition with panic attack 01/31/2020   Cerebral aneurysm without rupture 01/31/2020   Other abnormal glucose 09/03/2019   Intramural leiomyoma of uterus 02/29/2016   Need for immunization against influenza 01/28/2015   Hx of pulmonary embolus during pregnancy 11/09/2014    Past Medical History:  Diagnosis Date   Abnormal Pap smear    patient states she had cancer that was removed from her cervix   Asthma    when pregnant/ once 6 yrs ago only time asthma attack the dr said   Cancer [facility])    Clotting disorder ([place])    GERD (gastroesophageal reflux disease) 10/19/2015   Gestational diabetes    Intramural leiomyoma of uterus 02/29/2016   Pulmonary embolism ([place])    Renal insufficiency    Vitamin D deficiency    Vitamin D  deficiency     Family History  Problem Relation Age of Onset   Diabetes Mother    Hypertension Mother    Hyperlipidemia Father    Diabetes Father    Hypertension Father    Heart disease Sister    Diabetes Brother    Hyperlipidemia Brother    Drug abuse Paternal Grandmother    Kidney disease Other    Birth defects Other        anal atresia/stenosis   Past Surgical History:  Procedure Laterality Date   CHOLECYSTECTOMY     Social History   Social History Narrative   Married with five children. Came to [place] from [place] in 2001. Unemployed currently. Spanish speaking.   Immunization History  Administered Date(s) Administered   Influenza,inj,Quad PF,6+ Mos 05/20/2014, 01/28/2015, 02/03/2017, 02/11/2019, 01/30/2020, 12/28/2020   Moderna Sars-Covid-2 Vaccination 07/17/2019, 08/14/2019   Tdap 06/26/2013     Objective: Vital Signs: There were no vitals taken for this visit.   Physical Exam   Musculoskeletal Exam: ***  CDAI Exam: CDAI Score: -- Patient Global: --; Provider Global: -- Swollen: --; Tender: -- Joint Exam 02/03/2021   No joint exam has been documented for this visit   There is currently no information documented on the homunculus. Go to the Rheumatology activity and complete the homunculus joint exam.  Investigation: No additional findings.  Imaging: US Transvaginal Non-OB  Result Date: 01/29/2021  Non latex probe cover used.  T/V images.  Anteverted uterus normal in size and shape with 2 subserosal fibroids, largest measured at 2.6 cm posteriorly.  The overall uterine  size is measured at 7.27 x 5.32 x 4.83 cm.  Thin symmetrical endometrial lining measured at 2.77 mm.  No mass or thickening or abnormal blood flow seen.  Both ovaries are small with atrophic appearance.  No adnexal mass.  No free fluid in the posterior cul-de-sac.     Recent Labs: Lab Results  Component Value Date   WBC 9.0 12/28/2020   HGB 14.9 12/28/2020   PLT 303 12/28/2020    NA 142 12/28/2020   K 4.2 12/28/2020   CL 102 12/28/2020   CO2 23 12/28/2020   GLUCOSE 81 12/28/2020   BUN 11 12/28/2020   CREATININE 0.54 (L) 12/28/2020   BILITOT 0.3 09/02/2019   ALKPHOS 106 09/02/2019   AST 18 09/02/2019   ALT 12 09/02/2019   PROT 7.2 09/02/2019   ALBUMIN 4.1 09/02/2019   CALCIUM 9.4 12/28/2020   GFRAA 133 09/02/2019    Speciality Comments: No specialty comments available.  Procedures:  No procedures performed Allergies: Latex   Assessment / Plan:     Visit Diagnoses: No diagnosis found.  Orders: No orders of the defined types were placed in this encounter.  No orders of the defined types were placed in this encounter.   Face-to-face time spent with patient was *** minutes. Greater than 50% of time was spent in counseling and coordination of care.  Follow-Up Instructions: No follow-ups on file.   Kim Salina, MD  Note - This record has been created using [employer].  Chart creation errors have been sought, but may not always  have been located. Such creation errors do not reflect on  the standard of medical care.

## 2021-02-03 ENCOUNTER — Ambulatory Visit: Payer: BC Managed Care – PPO | Admitting: Internal Medicine

## 2021-03-10 ENCOUNTER — Ambulatory Visit: Payer: BC Managed Care – PPO | Admitting: Internal Medicine

## 2021-07-16 ENCOUNTER — Telehealth: Payer: Self-pay | Admitting: *Deleted

## 2021-07-16 ENCOUNTER — Emergency Department (HOSPITAL_COMMUNITY)
Admission: EM | Admit: 2021-07-16 | Discharge: 2021-07-16 | Disposition: A | Discharge status: Home / Self Care | Payer: BC Managed Care – PPO | Attending: Emergency Medicine | Admitting: Emergency Medicine

## 2021-07-16 ENCOUNTER — Emergency Department (HOSPITAL_COMMUNITY): Payer: BC Managed Care – PPO

## 2021-07-16 ENCOUNTER — Encounter (HOSPITAL_COMMUNITY): Payer: Self-pay | Admitting: Emergency Medicine

## 2021-07-16 ENCOUNTER — Other Ambulatory Visit: Payer: Self-pay

## 2021-07-16 DIAGNOSIS — R0602 Shortness of breath: Secondary | ICD-10-CM | POA: Insufficient documentation

## 2021-07-16 DIAGNOSIS — R0789 Other chest pain: Secondary | ICD-10-CM | POA: Diagnosis not present

## 2021-07-16 DIAGNOSIS — Z91040 Latex allergy status: Secondary | ICD-10-CM | POA: Insufficient documentation

## 2021-07-16 DIAGNOSIS — J45909 Unspecified asthma, uncomplicated: Secondary | ICD-10-CM | POA: Diagnosis not present

## 2021-07-16 DIAGNOSIS — Z8541 Personal history of malignant neoplasm of cervix uteri: Secondary | ICD-10-CM | POA: Insufficient documentation

## 2021-07-16 DIAGNOSIS — R519 Headache, unspecified: Secondary | ICD-10-CM | POA: Diagnosis not present

## 2021-07-16 DIAGNOSIS — R079 Chest pain, unspecified: Secondary | ICD-10-CM | POA: Diagnosis not present

## 2021-07-16 LAB — CBC
HCT: 40.1 % (ref 36.0–46.0)
Hemoglobin: 13.4 g/dL (ref 12.0–15.0)
MCH: 30.7 pg (ref 26.0–34.0)
MCHC: 33.4 g/dL (ref 30.0–36.0)
MCV: 92 fL (ref 80.0–100.0)
Platelets: 252 10*3/uL (ref 150–400)
RBC: 4.36 MIL/uL (ref 3.87–5.11)
RDW: 15 % (ref 11.5–15.5)
WBC: 7 10*3/uL (ref 4.0–10.5)
nRBC: 0.6 % — ABNORMAL HIGH (ref 0.0–0.2)

## 2021-07-16 LAB — BASIC METABOLIC PANEL
Anion gap: 11 (ref 5–15)
BUN: 11 mg/dL (ref 6–20)
CO2: 23 mmol/L (ref 22–32)
Calcium: 8.7 mg/dL — ABNORMAL LOW (ref 8.9–10.3)
Chloride: 105 mmol/L (ref 98–111)
Creatinine, Ser: 0.51 mg/dL (ref 0.44–1.00)
GFR, Estimated: >60 mL/min (ref >60)
Glucose, Bld: 122 mg/dL — ABNORMAL HIGH (ref 70–99)
Potassium: 3.7 mmol/L (ref 3.5–5.1)
Sodium: 139 mmol/L (ref 135–145)

## 2021-07-16 LAB — TROPONIN I (HIGH SENSITIVITY)
Troponin I (High Sensitivity): 2 ng/L (ref <18)
Troponin I (High Sensitivity): 3 ng/L (ref <18)

## 2021-07-16 MED ORDER — NAPROXEN 375 MG PO TABS: 375.0000 mg | ORAL_TABLET | Freq: Two times a day (BID) | ORAL | 0 refills | Status: DC

## 2021-07-16 MED ORDER — KETOROLAC TROMETHAMINE 15 MG/ML IJ SOLN
15.0000 mg | Freq: Once | INTRAMUSCULAR | Status: AC
  Administered 2021-07-16: 15 mg via INTRAMUSCULAR
  Filled 2021-07-16: qty 1

## 2021-07-16 NOTE — Telephone Encounter (Signed)
Patient walked in c/o chest pain radiating down left arm (via interpreter on her phone). Escorted to ED via w/c and handoff given to [place], EMT. ?

## 2021-07-16 NOTE — ED Provider Notes (Addendum)
?[facility] ?Provider Note ? ? ?CSN: [id number] ?Arrival date & time: 07/16/21  1200 ? ?  ? ?History ? ?Chief Complaint  ?Patient presents with  ? Chest Pain  ? ? ?[name] is a 51 y.o. female. ? ?51 year old female presents today for evaluation of 2-day duration of left-sided chest pain that radiates to her left arm and was associated with shortness of breath today.  Patient was evaluated at internal medicine office and was referred to emergency room for further evaluation.  She has history of PE during gestation otherwise without history of cardiac disease or other significant past medical history.  She denies lifting anything heavy recently.  She also endorses a headache however this has been a chronic issue and is not new for today.  She denies any visual change.  Denies recent travel, leg swelling, leg pain. ? ?The history is provided by the patient. A language interpreter was used.  ? ?  ? ?Home Medications ?Prior to Admission medications   ?Not on File  ?   ? ?Allergies    ?Latex   ? ?Review of Systems   ?Review of Systems  ?Constitutional:  Negative for chills and fever.  ?Respiratory:  Negative for shortness of breath.   ?Cardiovascular:  Positive for chest pain. Negative for palpitations and leg swelling.  ?Gastrointestinal:  Negative for abdominal pain, nausea and vomiting.  ?Neurological:  Positive for headaches (Chronic). Negative for syncope and light-headedness.  ?All other systems reviewed and are negative. ? ?Physical Exam ?Updated Vital Signs ?BP 133/89   Pulse (!) 49   Temp 98.3 ?F (36.8 ?C)   Resp 14   Ht '5\' 5"'$  (1.651 m)   Wt 87.4 kg   SpO2 100%   BMI 32.06 kg/m?  ?Physical Exam ?Vitals and nursing note reviewed.  ?Constitutional:   ?   General: She is not in acute distress. ?   Appearance: Normal appearance. She is not ill-appearing.  ?HENT:  ?   Head: Normocephalic and atraumatic.  ?   Nose: Nose normal.  ?Eyes:  ?   General: No scleral  icterus. ?   Extraocular Movements: Extraocular movements intact.  ?   Conjunctiva/sclera: Conjunctivae normal.  ?Cardiovascular:  ?   Rate and Rhythm: Normal rate and regular rhythm.  ?   Pulses: Normal pulses.  ?   Heart sounds: Normal heart sounds.  ?Pulmonary:  ?   Effort: Pulmonary effort is normal. No respiratory distress.  ?   Breath sounds: Normal breath sounds. No wheezing or rales.  ?Abdominal:  ?   General: There is no distension.  ?   Palpations: Abdomen is soft.  ?   Tenderness: There is no abdominal tenderness. There is no guarding.  ?Musculoskeletal:     ?   General: Normal range of motion.  ?   Cervical back: Normal range of motion.  ?   Comments: Reproducible left-sided chest pain  ?Skin: ?   General: Skin is warm and dry.  ?Neurological:  ?   General: No focal deficit present.  ?   Mental Status: She is alert. Mental status is at baseline.  ? ? ?ED Results / Procedures / Treatments   ?Labs ?(all labs ordered are listed, but only abnormal results are displayed) ?Labs Reviewed  ?BASIC METABOLIC PANEL - Abnormal; Notable for the following components:  ?    Result Value  ? Glucose, Bld 122 (*)   ? Calcium 8.7 (*)   ? All  other components within normal limits  ?CBC - Abnormal; Notable for the following components:  ? nRBC 0.6 (*)   ? All other components within normal limits  ?TROPONIN I (HIGH SENSITIVITY)  ?TROPONIN I (HIGH SENSITIVITY)  ? ? ?EKG ?EKG Interpretation ? ?Date/Time:  Friday July 16 2021 12:08:00 EDT ?Ventricular Rate:  75 ?PR Interval:  170 ?QRS Duration: 90 ?QT Interval:  362 ?QTC Calculation: 404 ?R Axis:   58 ?Text Interpretation: Normal sinus rhythm Normal ECG When compared with ECG of 03-Feb-2020 21:41, PREVIOUS ECG IS PRESENT No significant change since last tracing Confirmed by Isla Pence [phone]) on 07/16/2021 3:13:31 PM ? ?Radiology ?DG Chest 2 View ? ?Result Date: 07/16/2021 ?CLINICAL DATA:  Chest pain radiating to left arm, shortness of breath, lightheadedness EXAM: CHEST -  2 VIEW COMPARISON:  Chest radiograph 02/12/2020 FINDINGS: The cardiomediastinal silhouette is normal. There is no focal consolidation or pulmonary edema. There is no pleural effusion or pneumothorax There is no acute osseous abnormality. Cholecystectomy clips are noted. IMPRESSION: No radiographic evidence of acute cardiopulmonary process. Electronically Signed   By: Valetta Mole M.D.   On: 07/16/2021 12:55   ? ?Procedures ?Procedures  ? ? ?Medications Ordered in ED ?Medications  ?ketorolac (TORADOL) 15 MG/ML injection 15 mg (has no administration in time range)  ? ? ?ED Course/ Medical Decision Making/ A&P ?  ?                        ?Medical Decision Making ?Risk ?Prescription drug management. ? ? ?Medical Decision Making / ED Course ? ? ?This patient presents to the ED for concern of chest pain, this involves an extensive number of treatment options, and is a complaint that carries with it a high risk of complications and morbidity.  The differential diagnosis includes ACS, pneumonia, PE, pneumothorax, MSK pain ? ?MDM: ?51 year old female with past medical history of GERD, peripartum PE, headaches with known nonruptured cerebral aneurysm, cervical cancer that was treated 25 years ago in [place] with 2-day history of chest pain with radiation to her left arm.  Currently mild chest pain that is reproducible on exam.  Chest x-ray unremarkable.  CBC and BMP without acute abnormalities.  Troponin negative x2.  Toradol given with significant improvement in pain.  CBC, BMP unremarkable.  Troponin negative x2.  Chest x-ray unremarkable.  Low PE risk with Wells criteria.  Without tachycardia, hypoxia, or tachypnea.  Doubt PE.  Pain is reproducible with palpation on exam.  Most likely musculoskeletal.  Will provide naproxen given she had improvement with Toradol.  Previous clinic visits reviewed which confirm patient is baseline bradycardic.  Patient is appropriate for discharge.  Discharged in stable condition.  Return  precautions discussed. ? ?Lab Tests: ?-I ordered, reviewed, and interpreted labs.   ?The pertinent results include:   ?Labs Reviewed  ?BASIC METABOLIC PANEL - Abnormal; Notable for the following components:  ?    Result Value  ? Glucose, Bld 122 (*)   ? Calcium 8.7 (*)   ? All other components within normal limits  ?CBC - Abnormal; Notable for the following components:  ? nRBC 0.6 (*)   ? All other components within normal limits  ?TROPONIN I (HIGH SENSITIVITY)  ?TROPONIN I (HIGH SENSITIVITY)  ?  ? ? ?EKG ? EKG Interpretation ? ?Date/Time:  Friday July 16 2021 12:08:00 EDT ?Ventricular Rate:  75 ?PR Interval:  170 ?QRS Duration: 90 ?QT Interval:  362 ?QTC Calculation: 404 ?R Axis:  58 ?Text Interpretation: Normal sinus rhythm Normal ECG When compared with ECG of 03-Feb-2020 21:41, PREVIOUS ECG IS PRESENT No significant change since last tracing Confirmed by Isla Pence [phone]) on 07/16/2021 3:13:31 PM ?  ? ?  ? ? ? ?Imaging Studies ordered: ?I ordered imaging studies including chest x-ray ?I independently visualized and interpreted imaging. ?I agree with the radiologist interpretation ? ? ?Medicines ordered and prescription drug management: ?Meds ordered this encounter  ?Medications  ? ketorolac (TORADOL) 15 MG/ML injection 15 mg  ?  ?-I have reviewed the patients home medicines and have made adjustments as needed ? ?Reevaluation: ?After the interventions noted above, I reevaluated the patient and found that they have :improved ? ?Co morbidities that complicate the patient evaluation ? ?Past Medical History:  ?Diagnosis Date  ? Abnormal Pap smear   ? patient states she had cancer that was removed from her cervix  ? Asthma   ? when pregnant/ once 6 yrs ago only time asthma attack the dr said  ? Cancer [facility])   ? Clotting disorder ([place])   ? GERD (gastroesophageal reflux disease) 10/19/2015  ? Gestational diabetes   ? Intramural leiomyoma of uterus 02/29/2016  ? Pulmonary embolism ([place])   ? Renal insufficiency   ?  Vitamin D deficiency   ? Vitamin D deficiency   ?  ? ? ?Dispostion: ?Patient is appropriate for discharge.  Discharged in stable condition.  Return precautions discussed.  Patient voices understanding and is in

## 2021-07-16 NOTE — ED Provider Triage Note (Signed)
Emergency Medicine Provider Triage Evaluation Note ? ?[facility] , a 51 y.o. female  was evaluated in triage.  Pt complains of chest pain and shortness of breath along with a headache and left arm pain for the last 2 days.  The patient states that she was seen at her PCP who sent her over here for further evaluation.  Patient denies smoking history, cardiac history. ? ?Review of Systems  ?Positive: Chest pain, shortness of breath, headache, left arm pain ?Negative: Nausea, vomiting, abdominal pain ? ?Physical Exam  ?BP 137/84 (BP Location: Right Arm)   Pulse 69   Temp 98.3 ?F (36.8 ?C)   Resp 14   SpO2 94%  ?Gen:   Awake, no distress   ?Resp:  Normal effort  ?MSK:   Moves extremities without difficulty  ?Other:  Clear to auscultation bilaterally, no leg swelling, no JVD ? ?Medical Decision Making  ?Medically screening exam initiated at 12:34 PM.  Appropriate orders placed.  [facility] [name] was informed that the remainder of the evaluation will be completed by another provider, this initial triage assessment does not replace that evaluation, and the importance of remaining in the ED until their evaluation is complete. ? ? ?  ?Azucena Cecil, PA-C ?07/16/21 1235 ? ?

## 2021-07-16 NOTE — ED Notes (Signed)
Reviewed discharge instructions with patient. Follow-up care and medications reviewed. Patient  verbalized understanding. Patient A&Ox4, VSS, and ambulatory with steady gait upon discharge.  °

## 2021-07-16 NOTE — Discharge Instructions (Addendum)
Your work-up today was reassuring against a serious cause of your chest pain.  Your pain is most likely musculoskeletal in nature.  It improved after use received a shot of Toradol.  I will send naproxen into the pharmacy for you.  This is a anti-inflammatory medication.  Do not take ibuprofen along with this.  If you have any worsening symptoms please return to the emergency room.  Otherwise I recommend you follow-up with your primary care provider. ?

## 2021-07-16 NOTE — ED Triage Notes (Signed)
Patient here with complaint of chest pain radiating into her left arm, shortness of breath, lightheadedness, and headache that started a few days ago. Patient alert, oriented, speaking in complete sentences, and is in no apparent distress at this time. ?

## 2021-09-21 ENCOUNTER — Other Ambulatory Visit: Payer: Self-pay | Admitting: Obstetrics & Gynecology

## 2021-09-21 DIAGNOSIS — Z1231 Encounter for screening mammogram for malignant neoplasm of breast: Secondary | ICD-10-CM

## 2021-10-05 ENCOUNTER — Ambulatory Visit
Admission: RE | Admit: 2021-10-05 | Discharge: 2021-10-05 | Disposition: A | Discharge status: Home / Self Care | Payer: BC Managed Care – PPO | Source: Ambulatory Visit | Attending: Obstetrics & Gynecology | Admitting: Obstetrics & Gynecology

## 2021-10-05 DIAGNOSIS — Z1231 Encounter for screening mammogram for malignant neoplasm of breast: Secondary | ICD-10-CM

## 2021-10-09 ENCOUNTER — Encounter: Payer: Self-pay | Admitting: *Deleted

## 2021-11-01 DIAGNOSIS — I671 Cerebral aneurysm, nonruptured: Secondary | ICD-10-CM | POA: Diagnosis not present

## 2021-11-23 ENCOUNTER — Other Ambulatory Visit: Payer: Self-pay

## 2021-11-23 ENCOUNTER — Ambulatory Visit (INDEPENDENT_AMBULATORY_CARE_PROVIDER_SITE_OTHER): Payer: BC Managed Care – PPO | Admitting: Student

## 2021-11-23 ENCOUNTER — Encounter: Payer: Self-pay | Admitting: Student

## 2021-11-23 VITALS — BP 108/70 | HR 63 | Temp 98.8°F | Resp 24 | Ht 65.0 in | Wt 172.7 lb

## 2021-11-23 DIAGNOSIS — K1379 Other lesions of oral mucosa: Secondary | ICD-10-CM | POA: Diagnosis not present

## 2021-11-23 DIAGNOSIS — I671 Cerebral aneurysm, nonruptured: Secondary | ICD-10-CM | POA: Diagnosis not present

## 2021-11-23 DIAGNOSIS — K0889 Other specified disorders of teeth and supporting structures: Secondary | ICD-10-CM

## 2021-11-23 MED ORDER — BENZOCAINE 10 % MT GEL: 1.0000 | OROMUCOSAL | 0 refills | Status: DC | PRN

## 2021-11-23 NOTE — Patient Instructions (Signed)
[name],  It was nice seeing you in the clinic today.  I am sorry that you are mouth has been hurting.  Your right wisdom tooth is irritating the lining of your mouth and causing some mouth sores.  I placed a referral to a dentist to help with that tooth pain.  I ordered Orajel, which you can apply on the sore to help with the pain when eating.  If the pain does not improve within 1 week, please call us back and we can order a course of antibiotic.  Please return in about 1 month for follow-up, sooner if needed.  Take care,  It was nice seeing you in the clinic today.  I am sorry that you are mouth has been hurting.  Your right wisdom tooth is irritating the lining of your mouth and causing some mouth sores.  I placed a referral to a dentist to help with that tooth pain.  I ordered Orajel, which you can apply on the sore to help with the pain when eating.  If the pain does not improve within 1 week, please call us back and we can order a course of antibiotic.  Please return in about 1 month for follow-up, sooner if needed.  Take care,  Dr. Alfonse Spruce

## 2021-11-23 NOTE — Assessment & Plan Note (Signed)
Patient is following up with IR.  Most recent appointment on 7/17.  Her aneurysms are unchanged and stable.  She will follow-up with IR in 3 years.

## 2021-11-23 NOTE — Assessment & Plan Note (Addendum)
Patient endorses pain in her mouth that radiates to her right ear for the last 3 days.  Pain is worse with talking or eating.  She has been taking ibuprofen with minimal relief.  Patient denies any fever or chills.  She reports mild nausea with mild rhinorrhea.  Also endorses right wisdom tooth pain.  She denies any sinus issue or pressure.  Denies any sick contact.  Physical exam showed multiple ulcers where her right wisdom tooth is in contact with the mucosal membrane.  She also has several smaller aphthous ulcers along her gum on the right side.  Her gingiva is mildly swollen and erythematous without any active drainage.  No obvious ulcer noted on tongue.  Her ear exam were normal without signs of infection.  There was mild tenderness to palpation of the submandibular and postauricular area but no obvious lymphadenopathy palpated.  Mouth pain is from aphthous ulcer due to irritation of her right wisdom tooth.  Her ear pain is likely from lymphadenopathy.  No active ear infection.  Her gingiva is slightly erythematous but no signs of active infection.  -Refer patient to dentist for her wisdom tooth pain. -Prescribed Orajel as needed -Advised patient to continue ibuprofen as needed for inflammation -Patient is advised to call us back if pain does not improve in 1 week.  If so can start a short course of Augmentin

## 2021-11-23 NOTE — Progress Notes (Signed)
CC: Mouth pain  HPI:  Kim [name] is a 51 y.o. female with medical condition of cerebral aneurysm who presents to the clinic for 3 days of mouth pain  Please see problem based charting for detail  Past Medical History:  Diagnosis Date   Abnormal Pap smear    patient states Kim [name] had cancer that was removed from her cervix   Asthma    when pregnant/ once 6 yrs ago only time asthma attack the dr said   Cancer [facility])    Clotting disorder ([place])    GERD (gastroesophageal reflux disease) 10/19/2015   Gestational diabetes    Intramural leiomyoma of uterus 02/29/2016   Pulmonary embolism ([place])    Renal insufficiency    Vitamin D deficiency    Vitamin D deficiency    Review of Systems:  per HPI  Physical Exam:  Vitals:   11/23/21 1456  BP: 108/70  Pulse: 63  Resp: (!) 24  Temp: 98.8 F (37.1 C)  TempSrc: Oral  SpO2: 99%  Weight: 172 lb 11.3 oz (78.3 kg)  Height: '5\' 5"'$  (1.651 m)   Physical Exam Constitutional:      General: Kim [name] is not in acute distress.    Appearance: Kim [name] is not ill-appearing.  HENT:     Head: Normocephalic.     Right Ear: Tympanic membrane, ear canal and external ear normal. There is no impacted cerumen.     Left Ear: Tympanic membrane, ear canal and external ear normal. There is no impacted cerumen.     Ears:     Comments: No signs of effusion or purulent drainage of bilateral tympanic membrane    Nose: Nose normal. No congestion or rhinorrhea.     Mouth/Throat:     Comments: Multiple ulcers noted where her right wisdom tooth is in contact with the mucosal membrane.  Kim [name] also has several smaller aphthous ulcers along her gum on the right side.  Her gingiva is mildly swollen and erythematous without any active drainage. Eyes:     General: No scleral icterus.       Right eye: No discharge.        Left eye: No discharge.  Neck:     Comments: Mild tenderness to palpation of the submandibular and postauricular.  No obvious lymphadenopathy  palpated. Cardiovascular:     Rate and Rhythm: Normal rate and regular rhythm.  Pulmonary:     Effort: Pulmonary effort is normal. No respiratory distress.     Breath sounds: Normal breath sounds. No wheezing.  Abdominal:     General: Bowel sounds are normal.     Palpations: Abdomen is soft.  Musculoskeletal:        General: Normal range of motion.  Skin:    General: Skin is warm.  Neurological:     General: No focal deficit present.     Mental Status: Kim [name] is alert.  Psychiatric:        Mood and Affect: Mood normal.      Assessment & Plan:   See Encounters Tab for problem based charting.  Mouth pain Patient endorses pain in her mouth that radiates to her right ear for the last 3 days.  Pain is worse with talking or eating.  Kim [name] has been taking ibuprofen with minimal relief.  Patient denies any fever or chills.  Kim [name] reports mild nausea with mild rhinorrhea.  Also endorses right wisdom tooth pain.  Kim [name] denies any sinus issue or pressure.  Denies any sick  contact.  Physical exam showed multiple ulcers where her right wisdom tooth is in contact with the mucosal membrane.  Kim [name] also has several smaller aphthous ulcers along her gum on the right side.  Her gingiva is mildly swollen and erythematous without any active drainage.  No obvious ulcer noted on tongue.  Her ear exam were normal without signs of infection.  There was mild tenderness to palpation of the submandibular and postauricular area but no obvious lymphadenopathy palpated.  Mouth pain is from aphthous ulcer due to irritation of her right wisdom tooth.  Her ear pain is likely from lymphadenopathy.  No active ear infection.  Her gingiva is slightly erythematous but no signs of active infection.  -Refer patient to dentist for her wisdom tooth pain. -Prescribed Orajel as needed -Advised patient to continue ibuprofen as needed for inflammation -Patient is advised to call us back if pain does not improve in 1 week.  If so can  start a short course of Augmentin  Cerebral aneurysm without rupture Patient is following up with IR.  Most recent appointment on 7/17.  Her aneurysms are unchanged and stable.  Kim [name] will follow-up with IR in 3 years.   Patient discussed with Dr. Heber Gol[place]a

## 2021-11-25 NOTE — Progress Notes (Signed)
Internal Medicine Clinic Attending  Case discussed with the resident at the time of the visit.  We reviewed the resident's history and exam and pertinent patient test results.  I agree with the assessment, diagnosis, and plan of care documented in the resident's note.  

## 2021-12-27 ENCOUNTER — Encounter: Payer: BC Managed Care – PPO | Admitting: Student

## 2021-12-27 NOTE — Assessment & Plan Note (Deleted)
Dentist?

## 2022-04-13 ENCOUNTER — Ambulatory Visit (HOSPITAL_COMMUNITY)
Admission: EM | Admit: 2022-04-13 | Discharge: 2022-04-13 | Disposition: A | Discharge status: Home / Self Care | Payer: BC Managed Care – PPO | Attending: Internal Medicine | Admitting: Internal Medicine

## 2022-04-13 ENCOUNTER — Encounter (HOSPITAL_COMMUNITY): Payer: Self-pay

## 2022-04-13 DIAGNOSIS — R1013 Epigastric pain: Secondary | ICD-10-CM

## 2022-04-13 LAB — POCT URINALYSIS DIPSTICK, ED / UC
Bilirubin Urine: NEGATIVE
Glucose, UA: NEGATIVE mg/dL
Ketones, ur: NEGATIVE mg/dL
Nitrite: NEGATIVE
Protein, ur: 30 mg/dL — AB
Specific Gravity, Urine: 1.025 (ref 1.005–1.030)
Urobilinogen, UA: 0.2 mg/dL (ref 0.0–1.0)
pH: 6 (ref 5.0–8.0)

## 2022-04-13 MED ORDER — ALUM & MAG HYDROXIDE-SIMETH 200-200-20 MG/5ML PO SUSP
ORAL | Status: AC
  Filled 2022-04-13: qty 30

## 2022-04-13 MED ORDER — LIDOCAINE VISCOUS HCL 2 % MT SOLN: 15.0000 mL | Freq: Once | OROMUCOSAL | Status: DC

## 2022-04-13 MED ORDER — SUCRALFATE 1 G PO TABS: 1.0000 g | ORAL_TABLET | Freq: Three times a day (TID) | ORAL | 0 refills | Status: DC

## 2022-04-13 MED ORDER — LIDOCAINE VISCOUS HCL 2 % MT SOLN
OROMUCOSAL | Status: AC
  Filled 2022-04-13: qty 15

## 2022-04-13 MED ORDER — ESOMEPRAZOLE MAGNESIUM 20 MG PO CPDR: 20.0000 mg | DELAYED_RELEASE_CAPSULE | Freq: Every day | ORAL | 0 refills | Status: AC

## 2022-04-13 MED ORDER — ALUMINUM-MAGNESIUM-SIMETHICONE 200-200-20 MG/5ML PO SUSP
15.0000 mL | Freq: Three times a day (TID) | ORAL | 0 refills | Status: AC
Start: 2022-04-13 — End: ?

## 2022-04-13 MED ORDER — ALUM & MAG HYDROXIDE-SIMETH 200-200-20 MG/5ML PO SUSP: 30.0000 mL | Freq: Once | ORAL | Status: DC

## 2022-04-13 NOTE — ED Triage Notes (Signed)
Pt is here for nausea,dizziness  headache  abdominal pain with diarrhea x 3-4 days

## 2022-04-13 NOTE — Discharge Instructions (Addendum)
Your symptoms are concerning for gastritis or a developing peptic ulcer. Please take Nexium once daily 30 minutes before breakfast in the morning. Please take Carafate 4 times daily as prescribed. Please call your PCP and schedule a follow-up in 10 days. You may use the maalox as needed. Return to the clinic or follow-up in the emergency room if your symptoms worsen, vomiting develops, or fever develops.

## 2022-04-13 NOTE — ED Provider Notes (Signed)
Kim [name]    CSN: [id number] Arrival date & time: 04/13/22  1145      History   Chief Complaint Chief Complaint  Patient presents with   Abdominal Pain   Nausea    HPI [name] is a 51 y.o. female.   Pleasant 51 year old female presents today due to concerns of epigastric pain for the past 3 to 4 days.  She states it is only in the center just to her xiphoid.  She denies any radiation of pain.  She states it comes and goes in waves, and does not associate with anything in particular.  She states in the past 2 days all she has been able to eat is applesauce due to the discomfort.  She denies any active nausea in our office.  She has not been vomiting.  She reports having diarrhea all last evening, but otherwise denies any diarrhea today.  She denies a fever.  She did have a slight headache and dizziness last evening.  She denies any additional upper respiratory symptoms.  Denies body aches.  Does have some left-sided flank pain, and states it hurts when she urinates.  She has not tried any over-the-counter medications for her symptoms. Pt does not have a gallbladder.   Abdominal Pain   Past Medical History:  Diagnosis Date   Abnormal Pap smear    patient states she had cancer that was removed from her cervix   Asthma    when pregnant/ once 6 yrs ago only time asthma attack the dr said   Cancer [facility])    Clotting disorder ([place])    GERD (gastroesophageal reflux disease) 10/19/2015   Gestational diabetes    Intramural leiomyoma of uterus 02/29/2016   Pulmonary embolism ([place])    Renal insufficiency    Vitamin D deficiency    Vitamin D deficiency     Patient Active Problem List   Diagnosis Date Noted   Mouth pain 11/23/2021   TMJ (sprain of temporomandibular joint), initial encounter 12/29/2020   Anxiety disorder due to general medical condition with panic attack 01/31/2020   Cerebral aneurysm without rupture 01/31/2020   Intramural leiomyoma of  uterus 02/29/2016   Need for immunization against influenza 01/28/2015   Hx of pulmonary embolus during pregnancy 11/09/2014    Past Surgical History:  Procedure Laterality Date   CHOLECYSTECTOMY      OB History     Gravida  5   Para  5   Term  5   Preterm      AB      Living  5      SAB      IAB      Ectopic      Multiple  0   Live Births  5            Home Medications    Prior to Admission medications   Medication Sig Start Date End Date Taking? Authorizing Provider  aluminum-magnesium hydroxide-simethicone (MAALOX) 629-528-41 MG/5ML SUSP Take 15 mLs by mouth 4 (four) times daily -  before meals and at bedtime. 04/13/22  Yes Persais Ethridge L, PA  esomeprazole (NEXIUM) 20 MG capsule Take 1 capsule (20 mg total) by mouth daily. 04/13/22  Yes Lupita Rosales L, PA  sucralfate (CARAFATE) 1 g tablet Take 1 tablet (1 g total) by mouth 4 (four) times daily -  with meals and at bedtime for 10 days. 04/13/22 04/23/22 Yes Davina Howlett L, PA  benzocaine (ORAJEL) 10 %  mucosal gel Use as directed 1 Application in the mouth or throat as needed for mouth pain. 11/23/21   Gaylan Gerold, DO  naproxen (NAPROSYN) 375 MG tablet Take 1 tablet (375 mg total) by mouth 2 (two) times daily. 07/16/21   Evlyn Courier, PA-C    Family History Family History  Problem Relation Age of Onset   Diabetes Mother    Hypertension Mother    Hyperlipidemia Father    Diabetes Father    Hypertension Father    Heart disease Sister    Diabetes Brother    Hyperlipidemia Brother    Drug abuse Paternal Grandmother    Kidney disease Other    Birth defects Other        anal atresia/stenosis    Social History Social History   Tobacco Use   Smoking status: Never   Smokeless tobacco: Never  Vaping Use   Vaping Use: Never used  Substance Use Topics   Alcohol use: Yes    Alcohol/week: 0.0 standard drinks of alcohol    Comment: Occasionally.   Drug use: No     Allergies   Latex   Review  of Systems Review of Systems  Gastrointestinal:  Positive for abdominal pain.  As per HPI   Physical Exam Triage Vital Signs ED Triage Vitals [04/13/22 1500]  Enc Vitals Group     BP (!) 127/28     Pulse Rate 72     Resp 12     Temp 98 F (36.7 C)     Temp Source Oral     SpO2 97 %     Weight      Height      Head Circumference      Peak Flow      Pain Score      Pain Loc      Pain Edu?      Excl. in [place]?    No data found.  Updated Vital Signs BP 127/78 (BP Location: Left Arm)   Pulse 72   Temp 98 F (36.7 C) (Oral)   Resp 12   SpO2 97%   Visual Acuity Right Eye Distance:   Left Eye Distance:   Bilateral Distance:    Right Eye Near:   Left Eye Near:    Bilateral Near:     Physical Exam Vitals and nursing note reviewed. Exam conducted with a chaperone present.  Constitutional:      Appearance: She is well-developed and normal weight. She is not ill-appearing or toxic-appearing.     Comments: Mildly uncomfortable but NAD  HENT:     Head: Normocephalic and atraumatic.     Mouth/Throat:     Mouth: Mucous membranes are moist.     Pharynx: Oropharynx is clear. No pharyngeal swelling or oropharyngeal exudate.  Eyes:     General: No scleral icterus.    Extraocular Movements: Extraocular movements intact.     Pupils: Pupils are equal, round, and reactive to light.  Cardiovascular:     Rate and Rhythm: Normal rate and regular rhythm.  Pulmonary:     Effort: Pulmonary effort is normal.     Breath sounds: Normal breath sounds.  Abdominal:     General: Abdomen is flat. Bowel sounds are normal. There is no distension.     Palpations: Abdomen is soft. There is no hepatomegaly or splenomegaly.     Tenderness: There is abdominal tenderness in the epigastric area and left upper quadrant. There is no guarding or rebound. Negative  signs include Murphy's sign.  Neurological:     Mental Status: She is alert.      UC Treatments / Results  Labs (all labs ordered are  listed, but only abnormal results are displayed) Labs Reviewed  POCT URINALYSIS DIPSTICK, ED / UC - Abnormal; Notable for the following components:      Result Value   Hgb urine dipstick TRACE (*)    Protein, ur 30 (*)    Leukocytes,Ua TRACE (*)    All other components within normal limits    EKG   Radiology No results found.  Procedures Procedures (including critical care time)  Medications Ordered in UC Medications  alum & mag hydroxide-simeth (MAALOX/MYLANTA) 200-200-20 MG/5ML suspension 30 mL (30 mLs Oral Incomplete 04/13/22 1533)  lidocaine (XYLOCAINE) 2 % viscous mouth solution 15 mL (15 mLs Mouth/Throat Incomplete 04/13/22 1533)    Initial Impression / Assessment and Plan / UC Course  I have reviewed the triage vital signs and the nursing notes.  Pertinent labs & imaging results that were available during my care of the patient were reviewed by me and considered in my medical decision making (see chart for details).     Acute epigastric pain -patient admitted to feeling slight improvement 30 minutes after use of the Maalox lidocaine solution.  Discussed differentials including gastritis, dyspepsia, PUD among others.  Given symptomatic improvement, will take discharge patient on the Maalox solution.  Will also add PPI and Carafate for the next 10 days.  Encouraged patient to follow-up with her PCP for a recheck.  Urinalysis does not indicate UTI or pyelonephritis. VSS, pt appears clinically stable.  Final Clinical Impressions(s) / UC Diagnoses   Final diagnoses:  Acute epigastric pain     Discharge Instructions      Your symptoms are concerning for gastritis or a developing peptic ulcer. Please take Nexium once daily 30 minutes before breakfast in the morning. Please take Carafate 4 times daily as prescribed. Please call your PCP and schedule a follow-up in 10 days. You may use the maalox as needed. Return to the clinic or follow-up in the emergency room if your  symptoms worsen, vomiting develops, or fever develops.     ED Prescriptions     Medication Sig Dispense Auth. Provider   esomeprazole (NEXIUM) 20 MG capsule Take 1 capsule (20 mg total) by mouth daily. 30 capsule Brianni Manthe L, PA   sucralfate (CARAFATE) 1 g tablet Take 1 tablet (1 g total) by mouth 4 (four) times daily -  with meals and at bedtime for 10 days. 40 tablet Frenchie Dangerfield L, PA   aluminum-magnesium hydroxide-simethicone (MAALOX) 117-356-70 MG/5ML SUSP Take 15 mLs by mouth 4 (four) times daily -  before meals and at bedtime. 355 mL Laelyn Blumenthal L, PA      PDMP not reviewed this encounter.   Chaney Malling, [place] 04/13/22 1627

## 2022-09-21 ENCOUNTER — Ambulatory Visit: Payer: BC Managed Care – PPO | Admitting: Student

## 2022-09-21 ENCOUNTER — Encounter: Payer: Self-pay | Admitting: Student

## 2022-09-21 VITALS — BP 106/67 | HR 68 | Ht 65.0 in | Wt 170.7 lb

## 2022-09-21 DIAGNOSIS — A09 Infectious gastroenteritis and colitis, unspecified: Secondary | ICD-10-CM | POA: Insufficient documentation

## 2022-09-21 MED ORDER — AZITHROMYCIN 500 MG PO TABS: 500.0000 mg | ORAL_TABLET | Freq: Every day | ORAL | 0 refills | Status: AC

## 2022-09-21 NOTE — Progress Notes (Signed)
   CC: acute visit for abdominal pain, diarrhea  HPI:  Kim [name] is a 52 y.o. female with history listed below presenting to the [facility] for acute visit for abdominal pain, diarrhea. Please see individualized problem based charting for full HPI.  Past Medical History:  Diagnosis Date   Abnormal Pap smear    patient states she had cancer that was removed from her cervix   Asthma    when pregnant/ once 6 yrs ago only time asthma attack the dr said   Cancer [facility])    Clotting disorder ([facility])    GERD (gastroesophageal reflux disease) 10/19/2015   Gestational diabetes    Intramural leiomyoma of uterus 02/29/2016   Pulmonary embolism ([facility])    Renal insufficiency    Vitamin D deficiency    Vitamin D deficiency     Review of Systems:  Negative aside from that listed in individualized problem based charting.  Physical Exam:  Vitals:   09/21/22 1009  BP: 106/67  Pulse: 68  SpO2: 100%  Weight: 170 lb 11.2 oz (77.4 kg)  Height: 5\' 5"  (1.651 m)   Physical Exam Constitutional:      Appearance: She is well-developed. She is not ill-appearing.  HENT:     Mouth/Throat:     Mouth: Mucous membranes are moist.     Pharynx: Oropharynx is clear.  Eyes:     General: No scleral icterus.    Extraocular Movements: Extraocular movements intact.     Pupils: Pupils are equal, round, and reactive to light.  Cardiovascular:     Rate and Rhythm: Normal rate and regular rhythm.     Heart sounds: Normal heart sounds. No murmur heard.    No friction rub. No gallop.  Pulmonary:     Effort: Pulmonary effort is normal.     Breath sounds: Normal breath sounds. No wheezing, rhonchi or rales.  Abdominal:     General: Bowel sounds are increased. There is no distension.     Palpations: Abdomen is soft.     Tenderness: There is abdominal tenderness in the periumbilical area, left upper quadrant and left lower quadrant. There is no guarding or rebound.     Hernia: No hernia is present.  Skin:     General: Skin is warm and dry.  Neurological:     General: No focal deficit present.     Mental Status: She is alert and oriented to person, place, and time.  Psychiatric:        Mood and Affect: Mood normal.        Behavior: Behavior normal.      Assessment & Plan:   See Encounters Tab for problem based charting.  Patient discussed with Dr. Oswaldo Done

## 2022-09-21 NOTE — Assessment & Plan Note (Signed)
Reports a 2 weeks history of abdominal pain, bloating, and diarrhea. States that she traveled to [place] 3 weeks ago (city[place]side). 2 weeks ago, while there, she ate a pork taco. The following day, she began experiencing abdominal pain, bloating, and watery diarrhea. States that her symptoms were persisting. She went to the doctor there and they gave her an injection (unclear if antibiotics). However, despite this her symptoms have continued. She endorses 7-8 episodes of loose, nonbloody, watery stools daily. She has had to wake up at night on 3 separate occasions with diarrhea as well. She endorses associated chills and nausea, though denies any fevers or vomiting. Her bloating and abdominal pain are partially relieved after passing a bowel movement.   Reports that she has had decreased PO intake. She feels hungry but feels full after eating very little. She is drinking some water but not nearly as much as she did previously. She does endorse decreased frequency of urination as well. She denies any lightheadedness.   Her BP is 106/67 in clinic today. Orthostatic vitals were negative. On exam, she does have hyperactive bowel sounds along with mild tenderness to palpation in the LLQ, LUQ, and periumbilical region without any rigidity, guarding, or rebound tenderness.   Given her constellation of symptoms, this appears to be moderate-to-severe Traveler's diarrhea. Given severity of illness, I will treat with azithromycin 500mg  daily for a 3-day course empirically. She will follow up in 2 weeks to assess if symptoms have improved/resolved. If they are persisting, would recommend testing for Giardia since this can have a similar presentation but treatment would be different. Given decreased PO intake, decreased urination, and ongoing diarrhea, will check BMP to evaluate electrolytes and kidney function.  Plan: -azithromycin 500mg  daily for 3 days -f/u BMP -f/u in 2 weeks -if symptoms persisting or worsening,  would recommend testing for Giardia at follow up

## 2022-09-21 NOTE — Patient Instructions (Addendum)
Kim [name],  [place] sido un placer verte hoy en la clnica.   1. He recetado un antibitico llamado azitromicina. Tome 1 comprimido una vez al da durante 3 [address]  [phone]. 2. Vuelva a vernos en 2 semanas si sus sntomas no han mejorado. Si se siente mejor, entonces no necesita regresar para esa visita. 3. Haz tu mejor esfuerzo para [employer] al da con una buena hidratacin con agua.  Llame a nuestra clnica al [phone] si tiene alguna pregunta o inquietud. El mejor momento para llamar es de lunes a viernes de 9 a.m. a 4 p.m., pero hay alguien disponible las 24 horas del da, los 7 das de la semana en el mismo nmero. Si necesita resurtir [employer], notifique a su farmacia con una semana de anticipacin y nos enviarn una solicitud.   Gracias por permitirnos participar en su atencin. Esperamos verte la prxima! Ms. [name] [name],

## 2022-09-22 ENCOUNTER — Encounter: Payer: BC Managed Care – PPO | Admitting: Student

## 2022-09-22 NOTE — Progress Notes (Signed)
Internal Medicine Clinic Attending  Case discussed with Dr. Jinwala  At the time of the visit.  We reviewed the resident's history and exam and pertinent patient test results.  I agree with the assessment, diagnosis, and plan of care documented in the resident's note.  

## 2022-09-23 LAB — BMP8+ANION GAP
Anion Gap: 16 mmol/L (ref 10.0–18.0)
BUN/Creatinine Ratio: 13 (ref 9–23)
BUN: 7 mg/dL (ref 6–24)
CO2: 21 mmol/L (ref 20–29)
Calcium: 9 mg/dL (ref 8.7–10.2)
Chloride: 104 mmol/L (ref 96–106)
Creatinine, Ser: 0.56 mg/dL — ABNORMAL LOW (ref 0.57–1.00)
Glucose: 73 mg/dL (ref 70–99)
Potassium: 4.2 mmol/L (ref 3.5–5.2)
Sodium: 141 mmol/L (ref 134–144)
eGFR: 110 mL/min/{1.73_m2} (ref >59)

## 2022-10-25 ENCOUNTER — Other Ambulatory Visit: Payer: Self-pay | Admitting: Obstetrics and Gynecology

## 2022-10-25 DIAGNOSIS — Z1231 Encounter for screening mammogram for malignant neoplasm of breast: Secondary | ICD-10-CM

## 2022-10-27 ENCOUNTER — Ambulatory Visit: Payer: BC Managed Care – PPO

## 2022-10-27 DIAGNOSIS — Z1231 Encounter for screening mammogram for malignant neoplasm of breast: Secondary | ICD-10-CM | POA: Diagnosis not present

## 2022-12-01 ENCOUNTER — Telehealth: Payer: Self-pay

## 2022-12-01 NOTE — Telephone Encounter (Signed)
Patient called with her son on the phone and stated she is having active chest pain, spoke to [facility] and advised the patient she needs to go to the ED. Patient stated she will go.

## 2023-03-13 ENCOUNTER — Emergency Department (HOSPITAL_COMMUNITY): Payer: BC Managed Care – PPO

## 2023-03-13 ENCOUNTER — Encounter (HOSPITAL_COMMUNITY): Payer: Self-pay

## 2023-03-13 ENCOUNTER — Ambulatory Visit (HOSPITAL_COMMUNITY)
Admission: EM | Admit: 2023-03-13 | Discharge: 2023-03-13 | Disposition: A | Discharge status: Home / Self Care | Payer: BC Managed Care – PPO

## 2023-03-13 ENCOUNTER — Emergency Department (HOSPITAL_COMMUNITY)
Admission: EM | Admit: 2023-03-13 | Discharge: 2023-03-13 | Disposition: A | Discharge status: Home / Self Care | Payer: BC Managed Care – PPO | Attending: Emergency Medicine | Admitting: Emergency Medicine

## 2023-03-13 ENCOUNTER — Other Ambulatory Visit: Payer: Self-pay

## 2023-03-13 ENCOUNTER — Encounter (HOSPITAL_COMMUNITY): Payer: Self-pay | Admitting: Emergency Medicine

## 2023-03-13 DIAGNOSIS — S01311A Laceration without foreign body of right ear, initial encounter: Secondary | ICD-10-CM | POA: Insufficient documentation

## 2023-03-13 DIAGNOSIS — S0990XA Unspecified injury of head, initial encounter: Secondary | ICD-10-CM | POA: Diagnosis not present

## 2023-03-13 DIAGNOSIS — S060X0A Concussion without loss of consciousness, initial encounter: Secondary | ICD-10-CM | POA: Diagnosis not present

## 2023-03-13 DIAGNOSIS — S161XXA Strain of muscle, fascia and tendon at neck level, initial encounter: Secondary | ICD-10-CM | POA: Diagnosis not present

## 2023-03-13 DIAGNOSIS — W182XXA Fall in (into) shower or empty bathtub, initial encounter: Secondary | ICD-10-CM | POA: Insufficient documentation

## 2023-03-13 DIAGNOSIS — G44319 Acute post-traumatic headache, not intractable: Secondary | ICD-10-CM | POA: Diagnosis not present

## 2023-03-13 DIAGNOSIS — S01311D Laceration without foreign body of right ear, subsequent encounter: Secondary | ICD-10-CM

## 2023-03-13 DIAGNOSIS — Z91040 Latex allergy status: Secondary | ICD-10-CM | POA: Diagnosis not present

## 2023-03-13 MED ORDER — LIDOCAINE 5 % EX PTCH: 1.0000 | MEDICATED_PATCH | CUTANEOUS | 0 refills | Status: DC

## 2023-03-13 MED ORDER — ONDANSETRON HCL 4 MG PO TABS: 4.0000 mg | ORAL_TABLET | Freq: Four times a day (QID) | ORAL | 0 refills | Status: DC

## 2023-03-13 MED ORDER — CYCLOBENZAPRINE HCL 10 MG PO TABS: 10.0000 mg | ORAL_TABLET | Freq: Two times a day (BID) | ORAL | 0 refills | Status: DC | PRN

## 2023-03-13 MED ORDER — ACETAMINOPHEN 500 MG PO TABS
1000.0000 mg | ORAL_TABLET | Freq: Once | ORAL | Status: AC
  Administered 2023-03-13: 1000 mg via ORAL
  Filled 2023-03-13: qty 2

## 2023-03-13 MED ORDER — IBUPROFEN 400 MG PO TABS
600.0000 mg | ORAL_TABLET | Freq: Once | ORAL | Status: AC
  Administered 2023-03-13: 600 mg via ORAL
  Filled 2023-03-13: qty 1

## 2023-03-13 MED ORDER — LIDOCAINE 5 % EX PTCH
1.0000 | MEDICATED_PATCH | Freq: Once | CUTANEOUS | Status: DC
  Administered 2023-03-13: 1 via TRANSDERMAL
  Filled 2023-03-13: qty 1

## 2023-03-13 MED ORDER — ONDANSETRON 4 MG PO TBDP
4.0000 mg | ORAL_TABLET | Freq: Once | ORAL | Status: AC
  Administered 2023-03-13: 4 mg via ORAL
  Filled 2023-03-13: qty 1

## 2023-03-13 MED ORDER — CYCLOBENZAPRINE HCL 10 MG PO TABS
10.0000 mg | ORAL_TABLET | Freq: Once | ORAL | Status: AC
  Administered 2023-03-13: 10 mg via ORAL
  Filled 2023-03-13: qty 1

## 2023-03-13 NOTE — Discharge Instructions (Addendum)
You were seen in the emergency department after your head injury. You had no signs of bleeding in your brain or broken bones. You likely pulled a muscle in your neck and have a concussion. You can take Tylenol and Motrin every 6 hours as needed for pain and can use Flexeril a muscle relaxer as needed for breakthrough pain. This can make you drowsy so do not taking before driving, working or operating heavy machinery. You can use lidocaine patches ice or heat. You can take Zofran as needed for nausea. You can follow up with your primary doctor to have your symptoms rechecked and can follow up in the concussion clinic as needed. You should return to the ER for numbness or weakness in your arms or legs, repetitive vomiting or any other new or concerning symptoms.  Lo atendieron en el departamento de emergencias despus de su lesin en la cabeza. No tena signos de sangrado en el cerebro ni de huesos rotos. Probablemente se desgarr un msculo del cuello y sufri una conmocin cerebral. Puede tomar Tylenol y Motrin cada 6 horas segn sea necesario para el dolor y puede usar Flexeril, un relajante muscular, segn sea necesario para el dolor irruptivo. Esto puede provocar somnolencia, por lo que no lo tome antes de Science writer, trabajar o utilizar maquinaria pesada. Puede utilizar parches de lidocana con hielo o calor. Puede tomar Zofran segn sea necesario para las nuseas. Puede hacer un seguimiento con su mdico de atencin primaria para que vuelvan a controlar sus sntomas y Dentist un seguimiento en la clnica de conmociones cerebrales segn sea necesario. Debe regresar a la sala de emergencias si presenta entumecimiento o debilidad en los brazos o las piernas, vmitos repetitivos o cualquier otro sntoma nuevo o preocupante.

## 2023-03-13 NOTE — ED Provider Notes (Signed)
[name] EMERGENCY DEPARTMENT AT [facility] Provider Note   CSN: [id number] Arrival date & time: 03/13/23  8841     History  Chief Complaint  Patient presents with   [facility] Kim [name] is a 52 y.o. female.  Patient is a 52 year old female with no significant past medical history presenting to the emergency department with a headache after a fall.  Patient states that she was in the shower last night and reached out to grab more conditioner when she lost her balance and slipped and fell and hit the back of her head and her right ear.  She denies any loss of consciousness.  She states that she felt nauseous but has had no vomiting.  Denies any vision changes, numbness or weakness.  Reports some left-sided neck pain.  She went to urgent care this morning who recommended that she come to the ER for CT imaging.  The history is provided by the patient. A language interpreter was used (Spanish ID [phone]).  Fall       Home Medications Prior to Admission medications   Medication Sig Start Date End Date Taking? Authorizing Provider  cyclobenzaprine (FLEXERIL) 10 MG tablet Take 1 tablet (10 mg total) by mouth 2 (two) times daily as needed for muscle spasms. 03/13/23  Yes Kingsley, Turkey K, DO  lidocaine (LIDODERM) 5 % Place 1 patch onto the skin daily. Remove & Discard patch within 12 hours or as directed by MD 03/13/23  Yes Theresia Lo, Benetta Spar K, DO  ondansetron (ZOFRAN) 4 MG tablet Take 1 tablet (4 mg total) by mouth every 6 (six) hours. 03/13/23  Yes Theresia Lo, Turkey K, DO  aluminum-magnesium hydroxide-simethicone (MAALOX) 200-200-20 MG/5ML SUSP Take 15 mLs by mouth 4 (four) times daily -  before meals and at bedtime. Patient not taking: Reported on 03/13/2023 04/13/22   Guy Sandifer L, PA  benzocaine (ORAJEL) 10 % mucosal gel Use as directed 1 Application in the mouth or throat as needed for mouth pain. 11/23/21   Doran Stabler, DO  esomeprazole (NEXIUM) 20  MG capsule Take 1 capsule (20 mg total) by mouth daily. Patient not taking: Reported on 03/13/2023 04/13/22   Guy Sandifer L, PA  naproxen (NAPROSYN) 375 MG tablet Take 1 tablet (375 mg total) by mouth 2 (two) times daily. Patient not taking: Reported on 03/13/2023 07/16/21   Marita Kansas, PA-C  sucralfate (CARAFATE) 1 g tablet Take 1 tablet (1 g total) by mouth 4 (four) times daily -  with meals and at bedtime for 10 days. Patient not taking: Reported on 03/13/2023 04/13/22 04/23/22  Maretta Bees, PA      Allergies    Latex    Review of Systems   Review of Systems  Physical Exam Updated Vital Signs BP 130/77 (BP Location: Right Arm)   Pulse (!) 53   Temp 98.4 F (36.9 C) (Oral)   Resp 16   SpO2 100%  Physical Exam Vitals and nursing note reviewed.  Constitutional:      General: She is not in acute distress.    Appearance: Normal appearance.  HENT:     Head: Normocephalic and atraumatic.     Ears:     Comments: Healing appearing superficial laceration to R earlobe, mild bruising to earlobe but no hematoma, non-gaping, non-bleeding    Nose: Nose normal.     Mouth/Throat:     Mouth: Mucous membranes are moist.     Pharynx: Oropharynx is clear.  Eyes:  Extraocular Movements: Extraocular movements intact.     Conjunctiva/sclera: Conjunctivae normal.     Pupils: Pupils are equal, round, and reactive to light.  Neck:     Comments: No midline neck tenderness, L-sided paraspinal muscle tenderness to palpation, neck rotation intact Cardiovascular:     Rate and Rhythm: Normal rate and regular rhythm.     Heart sounds: Normal heart sounds.  Pulmonary:     Effort: Pulmonary effort is normal.     Breath sounds: Normal breath sounds.  Abdominal:     General: Abdomen is flat.     Palpations: Abdomen is soft.     Tenderness: There is no abdominal tenderness.  Musculoskeletal:        General: Normal range of motion.     Cervical back: Normal range of motion and neck supple.      Comments: No midline back tenderness No bony tenderness to bilateral UE or LE  Skin:    General: Skin is warm and dry.  Neurological:     General: No focal deficit present.     Mental Status: She is alert and oriented to person, place, and time.     Cranial Nerves: No cranial nerve deficit.     Sensory: No sensory deficit.     Motor: No weakness.  Psychiatric:        Mood and Affect: Mood normal.        Behavior: Behavior normal.     ED Results / Procedures / Treatments   Labs (all labs ordered are listed, but only abnormal results are displayed) Labs Reviewed - No data to display  EKG None  Radiology CT Head Wo Contrast  Result Date: 03/13/2023 CLINICAL DATA:  Head trauma, moderate-severe. EXAM: CT HEAD WITHOUT CONTRAST TECHNIQUE: Contiguous axial images were obtained from the base of the skull through the vertex without intravenous contrast. RADIATION DOSE REDUCTION: This exam was performed according to the departmental dose-optimization program which includes automated exposure control, adjustment of the mA and/or kV according to patient size and/or use of iterative reconstruction technique. COMPARISON:  Head CT 02/12/2020. FINDINGS: Brain: No acute intracranial hemorrhage. Gray-white differentiation is preserved. No hydrocephalus or extra-axial collection. No mass effect or midline shift. Vascular: No hyperdense vessel or unexpected calcification. Skull: No calvarial fracture or suspicious bone lesion. Skull base is unremarkable. Sinuses/Orbits: No acute finding. Other: None. IMPRESSION: No acute intracranial abnormality. Electronically Signed   By: Orvan Falconer M.D.   On: 03/13/2023 11:47    Procedures Procedures    Medications Ordered in ED Medications  acetaminophen (TYLENOL) tablet 1,000 mg (has no administration in time range)  lidocaine (LIDODERM) 5 % 1-3 patch (has no administration in time range)  ondansetron (ZOFRAN-ODT) disintegrating tablet 4 mg (has no  administration in time range)  ibuprofen (ADVIL) tablet 600 mg (has no administration in time range)  cyclobenzaprine (FLEXERIL) tablet 10 mg (has no administration in time range)    ED Course/ Medical Decision Making/ A&P Clinical Course as of 03/13/23 1303  Mon Mar 13, 2023  1215 No acute traumatic injury on [facility]. Likely has a concussion. Patient is stable for discharge home with outpatient follow up. [VK]    Clinical Course User Index [VK] Rexford Maus, DO                                 Medical Decision Making This patient presents to the ED with chief complaint(s) of  fall, head injury with no pertinent past medical history of which further complicates the presenting complaint. The complaint involves an extensive differential diagnosis and also carries with it a high risk of complications and morbidity.    The differential diagnosis includes considering ICH/mass effect though is low risk by [place] head CT rule making this less likely, concussion, he has no midline neck tenderness and is low risk by [place] C-spine rule making cervical spine fracture unlikely likely muscle strain or spasm, no presyncopal symptoms taking syncopal fall unlikely  Additional history obtained: Additional history obtained from [name] Records reviewed urgent care records  ED Course and Reassessment: On patient's arrival she is hemodynamically stable in no acute distress.  Was sent here from urgent care for CT imaging.  She is low risk by [place] head CT and C-spine rule out of her will have head CT with her severe headache.  She will be given Tylenol and lidocaine patch for symptomatic management and will be closely reassessed.  Independent labs interpretation:  [name]  Independent visualization of imaging: - I independently visualized the following imaging with scope of interpretation limited to determining acute life threatening conditions related to emergency care: CTH, which revealed no acute  disease  Consultation: - Consulted or discussed management/test interpretation w/ external professional: [name]  Consideration for admission or further workup: Patient has no emergent conditions requiring admission or further work-up at this time and is stable for discharge home with primary care follow-up  Social Determinants of health: [name]    Amount and/or Complexity of Data Reviewed Radiology: ordered.  Risk OTC drugs. Prescription drug management.          Final Clinical Impression(s) / ED Diagnoses Final diagnoses:  Concussion without loss of consciousness, initial encounter  Strain of neck muscle, initial encounter  Laceration of right ear lobe, subsequent encounter    Rx / DC Orders ED Discharge Orders          Ordered    cyclobenzaprine (FLEXERIL) 10 MG tablet  2 times daily PRN        03/13/23 1255    lidocaine (LIDODERM) 5 %  Every 24 hours        03/13/23 1255    ondansetron (ZOFRAN) 4 MG tablet  Every 6 hours        03/13/23 1301              Rexford Maus, DO 03/13/23 1303

## 2023-03-13 NOTE — ED Notes (Addendum)
Patient is being discharged from the Urgent Care and sent to the Emergency Department via pov . Per [facility]. NP, patient is in need of higher level of care due to limited resources. Patient is aware and verbalizes understanding of plan of care.  Vitals:   03/13/23 0902  BP: 122/73  Pulse: 64  Resp: 16  Temp: 98 F (36.7 C)  SpO2: 98%

## 2023-03-13 NOTE — ED Triage Notes (Signed)
Patient sent from UC for further evaluation after a fall yesterday. She slipped on some water in the shower, falling and hitting the back of her head and ear. Lac to R ear and pain to head and neck. Patient amnestic to the event but denies LOC. Denies other injury.

## 2023-03-13 NOTE — ED Provider Notes (Signed)
[facility]    CSN: [id number] Arrival date & time: 03/13/23  0855      History   Chief Complaint Chief Complaint  Patient presents with   Ear Laceration    HPI Kim [name] is a 52 y.o. female.   Patient presents for further evaluation after a fall that occurred last night.  Reports that she was taking a shower when she attempted to reach outside the shower curtain causing her to subsequently slip and fall backwards hitting her head.  Reports that the fall was witnessed by her spouse but they are not sure if she lost consciousness.  Although, she does not remember the fall.  Reports that she has 8/10 head pain in the back of her head that radiates down her neck and she has been having nausea.  Denies vomiting, dizziness, blurred vision.  She does not take any blood thinning medications.  Reports that she also has a laceration to her right ear from the fall.  She is not sure when her last tetanus vaccination was.     Past Medical History:  Diagnosis Date   Abnormal Pap smear    patient states she had cancer that was removed from her cervix   Asthma    when pregnant/ once 6 yrs ago only time asthma attack the dr said   Cancer [facility])    Clotting disorder (HCC)    GERD (gastroesophageal reflux disease) 10/19/2015   Gestational diabetes    Intramural leiomyoma of uterus 02/29/2016   Pulmonary embolism (HCC)    Renal insufficiency    Vitamin D deficiency    Vitamin D deficiency     Patient Active Problem List   Diagnosis Date Noted   Traveler's diarrhea 09/21/2022   Mouth pain 11/23/2021   TMJ (sprain of temporomandibular joint), initial encounter 12/29/2020   Anxiety disorder due to general medical condition with panic attack 01/31/2020   Cerebral aneurysm without rupture 01/31/2020   Intramural leiomyoma of uterus 02/29/2016   Need for immunization against influenza 01/28/2015   Hx of pulmonary embolus during pregnancy 11/09/2014    Past Surgical  History:  Procedure Laterality Date   CHOLECYSTECTOMY      OB History     Gravida  5   Para  5   Term  5   Preterm      AB      Living  5      SAB      IAB      Ectopic      Multiple  0   Live Births  5            Home Medications    Prior to Admission medications   Medication Sig Start Date End Date Taking? Authorizing Provider  aluminum-magnesium hydroxide-simethicone (MAALOX) 200-200-20 MG/5ML SUSP Take 15 mLs by mouth 4 (four) times daily -  before meals and at bedtime. Patient not taking: Reported on 03/13/2023 04/13/22   Guy Sandifer L, PA  benzocaine (ORAJEL) 10 % mucosal gel Use as directed 1 Application in the mouth or throat as needed for mouth pain. 11/23/21   Doran Stabler, DO  esomeprazole (NEXIUM) 20 MG capsule Take 1 capsule (20 mg total) by mouth daily. Patient not taking: Reported on 03/13/2023 04/13/22   Guy Sandifer L, PA  naproxen (NAPROSYN) 375 MG tablet Take 1 tablet (375 mg total) by mouth 2 (two) times daily. Patient not taking: Reported on 03/13/2023 07/16/21   Marita Kansas, PA-C  sucralfate (CARAFATE) 1 g tablet Take 1 tablet (1 g total) by mouth 4 (four) times daily -  with meals and at bedtime for 10 days. Patient not taking: Reported on 03/13/2023 04/13/22 04/23/22  Maretta Bees, PA    Family History Family History  Problem Relation Age of Onset   Diabetes Mother    Hypertension Mother    Hyperlipidemia Father    Diabetes Father    Hypertension Father    Heart disease Sister    Diabetes Brother    Hyperlipidemia Brother    Drug abuse Paternal Grandmother    Kidney disease Other    Birth defects Other        anal atresia/stenosis    Social History Social History   Tobacco Use   Smoking status: Never   Smokeless tobacco: Never  Vaping Use   Vaping status: Never Used  Substance Use Topics   Alcohol use: Yes    Alcohol/week: 0.0 standard drinks of alcohol    Comment: Occasionally.   Drug use: No      Allergies   Latex   Review of Systems Review of Systems Per HPI  Physical Exam Triage Vital Signs ED Triage Vitals  Encounter Vitals Group     BP 03/13/23 0902 122/73     Systolic BP Percentile --      Diastolic BP Percentile --      Pulse Rate 03/13/23 0902 64     Resp 03/13/23 0902 16     Temp 03/13/23 0902 98 F (36.7 C)     Temp Source 03/13/23 0902 Oral     SpO2 03/13/23 0902 98 %     Weight --      Height --      Head Circumference --      Peak Flow --      Pain Score 03/13/23 0907 8     Pain Loc --      Pain Education --      Exclude from Growth Chart --    No data found.  Updated Vital Signs BP 122/73 (BP Location: Right Arm)   Pulse 64   Temp 98 F (36.7 C) (Oral)   Resp 16   SpO2 98%   Visual Acuity Right Eye Distance:   Left Eye Distance:   Bilateral Distance:    Right Eye Near:   Left Eye Near:    Bilateral Near:     Physical Exam Constitutional:      General: She is not in acute distress.    Appearance: Normal appearance. She is not toxic-appearing or diaphoretic.  HENT:     Head: Normocephalic and atraumatic.     Comments: Patient does have tenderness to palpation to occipital portion of head but there is no obvious significant swelling, lacerations, abrasions, discoloration noted.    Ears:     Comments: Patient has a very superficial linear laceration that extends from the anterior portion of the earlobe and wraps around the posterior earlobe.  Bleeding is controlled.  Wound edges are closely approximated.  Minimal swelling noted. Eyes:     Extraocular Movements: Extraocular movements intact.     Conjunctiva/sclera: Conjunctivae normal.  Neck:     Comments: Tenderness to palpation to bilateral lateral neck muscles.  No direct spinal tenderness, crepitus, step-off noted.  Full range of motion of neck present but patient has pain with range of motion. Pulmonary:     Effort: Pulmonary effort is normal.  Neurological:  General:  No focal deficit present.     Mental Status: She is alert and oriented to person, place, and time. Mental status is at baseline.     Cranial Nerves: Cranial nerves 2-12 are intact.     Sensory: Sensation is intact.     Motor: Motor function is intact.     Coordination: Coordination is intact.     Gait: Gait is intact.  Psychiatric:        Mood and Affect: Mood normal.        Behavior: Behavior normal.        Thought Content: Thought content normal.        Judgment: Judgment normal.      UC Treatments / Results  Labs (all labs ordered are listed, but only abnormal results are displayed) Labs Reviewed - No data to display  EKG   Radiology No results found.  Procedures Procedures (including critical care time)  Medications Ordered in UC Medications - No data to display  Initial Impression / Assessment and Plan / UC Course  I have reviewed the triage vital signs and the nursing notes.  Pertinent labs & imaging results that were available during my care of the patient were reviewed by me and considered in my medical decision making (see chart for details).     Given patient is reporting significant headache with associated nausea and head injury, recommend that she get CT imaging of the head which cannot be performed here at urgent care.  She most likely needs CT of the neck as well.  Advised patient to go to the ER for further evaluation and management and she was agreeable with plan.  Laceration does not appear to need closure as it is a very superficial and wound edges are closely approximated.  She will most likely need tetanus vaccination so will defer to the ER.  Neuro exam is normal so agree with patient's family member transporting her to the ER.  Patient declined interpreter and wished for family member to interpret today for her. Final Clinical Impressions(s) / UC Diagnoses   Final diagnoses:  Injury of head, initial encounter  Acute post-traumatic headache, not  intractable  Laceration of right ear lobe, initial encounter   Discharge Instructions   None    ED Prescriptions   None    PDMP not reviewed this encounter.   Gustavus Bryant, [place] 03/13/23 [phone]

## 2023-03-13 NOTE — ED Triage Notes (Addendum)
Pt presents after a fall yesterday getting out of shower. States she hit the back of her head and has laceration to right ear lobe. Pt does not remember the fall. C/o of head pain at back of lower head/neck.

## 2023-03-19 DIAGNOSIS — Z419 Encounter for procedure for purposes other than remedying health state, unspecified: Secondary | ICD-10-CM | POA: Diagnosis not present

## 2023-04-19 DIAGNOSIS — Z419 Encounter for procedure for purposes other than remedying health state, unspecified: Secondary | ICD-10-CM | POA: Diagnosis not present

## 2023-05-19 ENCOUNTER — Emergency Department (HOSPITAL_COMMUNITY)
Admission: EM | Admit: 2023-05-19 | Discharge: 2023-05-19 | Disposition: A | Discharge status: Home / Self Care | Payer: BC Managed Care – PPO | Attending: Emergency Medicine | Admitting: Emergency Medicine

## 2023-05-19 ENCOUNTER — Emergency Department (HOSPITAL_COMMUNITY): Payer: BC Managed Care – PPO

## 2023-05-19 ENCOUNTER — Other Ambulatory Visit: Payer: Self-pay

## 2023-05-19 DIAGNOSIS — R9389 Abnormal findings on diagnostic imaging of other specified body structures: Secondary | ICD-10-CM | POA: Diagnosis not present

## 2023-05-19 DIAGNOSIS — K7689 Other specified diseases of liver: Secondary | ICD-10-CM | POA: Diagnosis not present

## 2023-05-19 DIAGNOSIS — D259 Leiomyoma of uterus, unspecified: Secondary | ICD-10-CM | POA: Insufficient documentation

## 2023-05-19 DIAGNOSIS — D219 Benign neoplasm of connective and other soft tissue, unspecified: Secondary | ICD-10-CM

## 2023-05-19 DIAGNOSIS — Z91040 Latex allergy status: Secondary | ICD-10-CM | POA: Diagnosis not present

## 2023-05-19 DIAGNOSIS — R1031 Right lower quadrant pain: Secondary | ICD-10-CM | POA: Diagnosis not present

## 2023-05-19 DIAGNOSIS — N200 Calculus of kidney: Secondary | ICD-10-CM | POA: Diagnosis not present

## 2023-05-19 DIAGNOSIS — K5730 Diverticulosis of large intestine without perforation or abscess without bleeding: Secondary | ICD-10-CM | POA: Diagnosis not present

## 2023-05-19 LAB — CBC
HCT: 38.7 % (ref 36.0–46.0)
Hemoglobin: 13.1 g/dL (ref 12.0–15.0)
MCH: 31.2 pg (ref 26.0–34.0)
MCHC: 33.9 g/dL (ref 30.0–36.0)
MCV: 92.1 fL (ref 80.0–100.0)
Platelets: 225 10*3/uL (ref 150–400)
RBC: 4.2 MIL/uL (ref 3.87–5.11)
RDW: 12.5 % (ref 11.5–15.5)
WBC: 6.1 10*3/uL (ref 4.0–10.5)
nRBC: 0 % (ref 0.0–0.2)

## 2023-05-19 LAB — COMPREHENSIVE METABOLIC PANEL
ALT: 25 U/L (ref 0–44)
AST: 32 U/L (ref 15–41)
Albumin: 3.5 g/dL (ref 3.5–5.0)
Alkaline Phosphatase: 83 U/L (ref 38–126)
Anion gap: 7 (ref 5–15)
BUN: 16 mg/dL (ref 6–20)
CO2: 23 mmol/L (ref 22–32)
Calcium: 8.8 mg/dL — ABNORMAL LOW (ref 8.9–10.3)
Chloride: 107 mmol/L (ref 98–111)
Creatinine, Ser: 0.5 mg/dL (ref 0.44–1.00)
GFR, Estimated: >60 mL/min (ref >60)
Glucose, Bld: 98 mg/dL (ref 70–99)
Potassium: 4.1 mmol/L (ref 3.5–5.1)
Sodium: 137 mmol/L (ref 135–145)
Total Bilirubin: 0.9 mg/dL (ref 0.0–1.2)
Total Protein: 6.7 g/dL (ref 6.5–8.1)

## 2023-05-19 LAB — URINALYSIS, ROUTINE W REFLEX MICROSCOPIC
Bilirubin Urine: NEGATIVE
Glucose, UA: NEGATIVE mg/dL
Hgb urine dipstick: NEGATIVE
Ketones, ur: NEGATIVE mg/dL
Nitrite: NEGATIVE
Protein, ur: NEGATIVE mg/dL
Specific Gravity, Urine: 1.017 (ref 1.005–1.030)
pH: 7 (ref 5.0–8.0)

## 2023-05-19 LAB — HCG, SERUM, QUALITATIVE: Preg, Serum: NEGATIVE

## 2023-05-19 LAB — LIPASE, BLOOD: Lipase: 27 U/L (ref 11–51)

## 2023-05-19 MED ORDER — FENTANYL CITRATE PF 50 MCG/ML IJ SOSY
50.0000 ug | PREFILLED_SYRINGE | Freq: Once | INTRAMUSCULAR | Status: AC
  Administered 2023-05-19: 50 ug via INTRAVENOUS
  Filled 2023-05-19: qty 1

## 2023-05-19 MED ORDER — NAPROXEN 375 MG PO TABS: 375.0000 mg | ORAL_TABLET | Freq: Two times a day (BID) | ORAL | 0 refills | Status: DC

## 2023-05-19 MED ORDER — IOHEXOL 350 MG/ML SOLN
75.0000 mL | Freq: Once | INTRAVENOUS | Status: AC | PRN
  Administered 2023-05-19: 75 mL via INTRAVENOUS

## 2023-05-19 MED ORDER — ONDANSETRON HCL 4 MG PO TABS: 4.0000 mg | ORAL_TABLET | Freq: Three times a day (TID) | ORAL | 0 refills | Status: DC | PRN

## 2023-05-19 MED ORDER — ONDANSETRON HCL 4 MG/2ML IJ SOLN
4.0000 mg | Freq: Once | INTRAMUSCULAR | Status: AC
  Administered 2023-05-19: 4 mg via INTRAVENOUS
  Filled 2023-05-19: qty 2

## 2023-05-19 NOTE — ED Provider Notes (Incomplete)
[place] EMERGENCY DEPARTMENT AT [facility] Provider Note   CSN: [id number] Arrival date & time: 05/19/23  7425     History {Add pertinent medical, surgical, social history, OB history to HPI:1} Chief Complaint  Patient presents with   Abdominal Pain    [facility] Kim [name] is a 53 y.o. female.  The history is provided by the patient. The history is limited by a language barrier. A language interpreter was used.  Abdominal Pain Pain location:  LLQ Pain quality: aching and sharp   Pain radiates to:  Does not radiate Pain severity:  Severe Onset quality: 4 weeks ago previously intertmittent, 24 hours ago pain suddenly worsened and is now constant. Timing:  Constant Progression:  Worsening Context: not alcohol use, not awakening from sleep, not diet changes, not eating, not laxative use, not medication withdrawal, not previous surgeries, not recent illness, not recent sexual activity, not recent travel, not retching, not sick contacts, not suspicious food intake and not trauma   Worsened by:  Nothing Ineffective treatments:  None tried Associated symptoms: nausea   Associated symptoms: no chills, no constipation, no diarrhea, no dysuria, no fever, no hematuria, no vaginal bleeding, no vaginal discharge and no vomiting   Associated symptoms comment:  + BL low back pain worse in the R Risk factors: has not had multiple surgeries, no NSAID use and no recent hospitalization   LMP 2+ years ago.      Home Medications Prior to Admission medications   Medication Sig Start Date End Date Taking? Authorizing Provider  aluminum-magnesium hydroxide-simethicone (MAALOX) 200-200-20 MG/5ML SUSP Take 15 mLs by mouth 4 (four) times daily -  before meals and at bedtime. Patient not taking: Reported on 03/13/2023 04/13/22   Guy Sandifer L, PA  benzocaine (ORAJEL) 10 % mucosal gel Use as directed 1 Application in the mouth or throat as needed for mouth pain. 11/23/21   Doran Stabler,  DO  cyclobenzaprine (FLEXERIL) 10 MG tablet Take 1 tablet (10 mg total) by mouth 2 (two) times daily as needed for muscle spasms. 03/13/23   Elayne Snare K, DO  esomeprazole (NEXIUM) 20 MG capsule Take 1 capsule (20 mg total) by mouth daily. Patient not taking: Reported on 03/13/2023 04/13/22   Guy Sandifer L, PA  HYDROcodone-acetaminophen (NORCO/VICODIN) 5-325 MG tablet Take 1 tablet by mouth every 6 (six) hours as needed. 03/13/15   [provider]  lidocaine (LIDODERM) 5 % Place 1 patch onto the skin daily. Remove & Discard patch within 12 hours or as directed by MD 03/13/23   Elayne Snare K, DO  naproxen (NAPROSYN) 375 MG tablet Take 1 tablet (375 mg total) by mouth 2 (two) times daily. Patient not taking: Reported on 03/13/2023 07/16/21   Marita [place], PA-C  ondansetron (ZOFRAN) 4 MG tablet Take 1 tablet (4 mg total) by mouth every 6 (six) hours. 03/13/23   Elayne Snare K, DO  sertraline (ZOLOFT) 100 MG tablet Take 100 mg by mouth daily. 01/05/15   [provider]  sucralfate (CARAFATE) 1 g tablet Take 1 tablet (1 g total) by mouth 4 (four) times daily -  with meals and at bedtime for 10 days. Patient not taking: Reported on 03/13/2023 04/13/22 04/23/22  Guy Sandifer L, PA      Allergies    Latex    Review of Systems   Review of Systems  Constitutional:  Negative for chills and fever.  Gastrointestinal:  Positive for abdominal pain and nausea. Negative for constipation, diarrhea  and vomiting.  Genitourinary:  Negative for dysuria, hematuria, vaginal bleeding and vaginal discharge.    Physical Exam Updated Vital Signs BP 126/83 (BP Location: Right Arm)   Pulse 74   Temp 97.7 F (36.5 C)   Resp 18   SpO2 99%  Physical Exam Vitals and nursing note reviewed.  Constitutional:      General: She is not in acute distress.    Appearance: She is well-developed. She is not diaphoretic.  HENT:     Head: Normocephalic and atraumatic.     Right Ear:  External ear normal.     Left Ear: External ear normal.     Nose: Nose normal.     Mouth/Throat:     Mouth: Mucous membranes are moist.  Eyes:     General: No scleral icterus.    Conjunctiva/sclera: Conjunctivae normal.  Cardiovascular:     Rate and Rhythm: Normal rate and regular rhythm.     Heart sounds: Normal heart sounds. No murmur heard.    No friction rub. No gallop.  Pulmonary:     Effort: Pulmonary effort is normal. No respiratory distress.     Breath sounds: Normal breath sounds.  Abdominal:     General: Bowel sounds are normal. There is no distension.     Palpations: Abdomen is soft. There is no mass.     Tenderness: There is abdominal tenderness in the right lower quadrant and suprapubic area. There is no right CVA tenderness, left CVA tenderness or guarding.  Musculoskeletal:     Cervical back: Normal range of motion.  Skin:    General: Skin is warm and dry.  Neurological:     Mental Status: She is alert and oriented to person, place, and time.  Psychiatric:        Behavior: Behavior normal.     ED Results / Procedures / Treatments   Labs (all labs ordered are listed, but only abnormal results are displayed) Labs Reviewed  COMPREHENSIVE METABOLIC PANEL - Abnormal; Notable for the following components:      Result Value   Calcium 8.8 (*)    All other components within normal limits  URINALYSIS, ROUTINE W REFLEX MICROSCOPIC - Abnormal; Notable for the following components:   APPearance HAZY (*)    Leukocytes,Ua SMALL (*)    Bacteria, UA RARE (*)    All other components within normal limits  LIPASE, BLOOD  CBC  HCG, SERUM, QUALITATIVE    EKG None  Radiology No results found.  Procedures Procedures  {Document cardiac monitor, telemetry assessment procedure when appropriate:1}  Medications Ordered in ED Medications - No data to display  ED Course/ Medical Decision Making/ A&P Clinical Course as of 05/19/23 0900  Fri May 19, 2023  0841 WBC: 6.1  [AH]    Clinical Course User Index [AH] Arthor Captain, PA-C   {   Click here for ABCD2, HEART and other calculatorsREFRESH Note before signing :1}                              Medical Decision Making Amount and/or Complexity of Data Reviewed Labs: ordered. Decision-making details documented in ED Course. Radiology: ordered.   This patient presents to the ED with chief complaint(s) of LLQ abd pain  with pertinent past medical history of IBS, Postmenopausal, coagulopathy which further complicates the presenting complaint. The complaint involves an extensive differential diagnosis and treatment options and also carries with it a high risk  of complications and morbidity.    Differential diagnosis of her lower abdominal considerations include , appendicitis, urinary calculus/ureteral colic,diverticulitis, cystitis, less likely ruptured ovarian cyst or tumor, ovarian torsion, tubo-ovarian abscess, degeneration of fibroid, pelvic inflammatory disease.    The initial plan is to order CT abdomen and pelvis and to treat patient with fentanyl and Zofran for pain and nausea  Additional Tests and treatment considered: Patient is low risk / negative by ***, therefore do not feel that *** is indicated.  Additional history obtained: Additional history obtained from {additional history:26846} Records reviewed {records:26847}  Reassessment and review (also see workup area): Lab Tests: I Ordered, and personally interpreted labs.  The pertinent results include: I reviewed the patient's labs CBC without abnormality, normal lipase, CMP with mildly low calcium of insignificant value. Urinalysis appears negative.  Negative pregnancy test.  Imaging Studies: I ordered and independently visualized and interpreted the following imaging {imaging:26848}  which showed *** The interpretation of the imaging was limited to assessing for emergent pathology, for which purpose it was ordered.  Consultations  Obtained: I requested consultation with the {consultation:26851}, and discussed  findings as well as pertinent plan - they recommend: ***  Medicines ordered and prescription drug management: I ordered the following medications *** for ***  I considered this additional medications: ***   Reevaluation of the patient after these medicines showed that the patient    {resolved/improved/worsened:23923::"improved"}  Social Determinants of Health: Patient's language barrier increases the complexity of managing their presentation  Cardiac Monitoring: [name]   Complexity of problems addressed: Patient's presentation is most consistent with  {ZOXW:96045} During patient's assessment  Disposition: After consideration of the diagnostic results and the patient's response to treatment,  I feel that the patent would benefit from {disposition:26850}.    {Document critical care time when appropriate:1} {Document review of labs and clinical decision tools ie heart score, Chads2Vasc2 etc:1}  {Document your independent review of radiology images, and any outside records:1} {Document your discussion with family members, caretakers, and with consultants:1} {Document social determinants of health affecting pt's care:1} {Document your decision making why or why not admission, treatments were needed:1} Final Clinical Impression(s) / ED Diagnoses Final diagnoses:  None    Rx / DC Orders ED Discharge Orders     None

## 2023-05-19 NOTE — ED Triage Notes (Signed)
Patient reports RLQ abdominal pain with nausea onset this evening , no emesis or diarrhea.

## 2023-05-19 NOTE — ED Notes (Signed)
 Patient transported to [place]

## 2023-05-19 NOTE — ED Notes (Signed)
 Patient transported to CT

## 2023-05-19 NOTE — Discharge Instructions (Signed)
Your evaluation today shows no evidence of an emergency cause of your pain.  Your CT and US showed a uterine Fibroid. This is a Benign (not cancerous) and very common tumor of the uterus. They can cause pelvic pain and the most likely cause of your discomfort today. Please read the attached information about uterine fibroids. Please follow up with a gynecologist. If you do not have one, you may follow up at the  [facility].   I am ordering anti-inflammatory medications for pain relief.  You may take these twice a day with food as needed for pelvic pain. I have also ordered some antinausea medications.   Su evaluacin de hoy no [place] evidencia de [place] de emergencia para su dolor.  Su tomografa computarizada y ecografa mostraron un fibroma uterino. Este es un tumor del tero benigno (no canceroso) y Aeronautical engineer. Pueden provocar dolor plvico y la causa ms probable de Publishing rights manager actual. Lea la informacin adjunta sobre los fibromas uterinos. Por favor haga un seguimiento con un gineclogo. Si no tienes uno, podrs [employer] de [employer] de la [place].  Estoy ordenando medicamentos antiinflamatorios para Engineer, materials.  Puede tomarlos dos veces al da con alimentos, segn sea necesario para el dolor plvico. Tambin he ordenado algunos medicamentos contra las nuseas.  Obtenga ayuda de inmediato si:  Te desmayas.  Tiene dolor en la pelvis que de repente empeora.  Tiene un sangrado muy intenso de la vagina que empapa un tampn o una toalla sanitaria en 30 minutos o menos

## 2023-05-20 DIAGNOSIS — Z419 Encounter for procedure for purposes other than remedying health state, unspecified: Secondary | ICD-10-CM | POA: Diagnosis not present

## 2023-06-05 ENCOUNTER — Encounter: Payer: Self-pay | Admitting: Obstetrics and Gynecology

## 2023-06-05 ENCOUNTER — Ambulatory Visit (INDEPENDENT_AMBULATORY_CARE_PROVIDER_SITE_OTHER): Payer: Medicaid Other | Admitting: Obstetrics and Gynecology

## 2023-06-05 VITALS — BP 122/78 | HR 70 | Temp 97.8°F | Wt 187.0 lb

## 2023-06-05 DIAGNOSIS — R1031 Right lower quadrant pain: Secondary | ICD-10-CM

## 2023-06-05 DIAGNOSIS — K5900 Constipation, unspecified: Secondary | ICD-10-CM | POA: Diagnosis not present

## 2023-06-05 DIAGNOSIS — D251 Intramural leiomyoma of uterus: Secondary | ICD-10-CM | POA: Diagnosis not present

## 2023-06-05 NOTE — Progress Notes (Signed)
 53 y.o. [id number] female with known fibroids, hx of PE and cerebral aneurysm here for ED follow-up of right lower quadrant pain. Married. Due to language barrier, an interpreter was present during the history-taking and subsequent discussion (and for part of the physical exam) with this patient.  No LMP recorded. Patient is postmenopausal.  Patient was seen in the ED on 05/19/2023 for right lower quadrant pain. Pt reports abdominal cramping and pain. Constipation often with bleeding.  Reports mild, crampy RLQ pain that happens ~weekly.  She was worried about her appendix and this is what prompted her to go to the emergency department on 05/19/2023.  +nausea and constipation. She is taking over-the-counter medications for reflux.  She is not taking anything for constipation. She has been evaluated for hemorrhoids before but her symptoms are currently poorly managed.  05/19/2023 CT impression: IMPRESSION: 1. No acute localizing findings in the abdomen or pelvis. 2. Nonobstructive 3 mm left renal calculus. 3. Sigmoid colonic diverticulosis without evidence of acute diverticulitis. 4. Focal rounded hyperdensity at the uterine fundus may represent a fibroid. Consider further evaluation with pelvic ultrasound as clinically warranted. 5. Mild prominence of the intra and extrahepatic biliary tree likely relates to post cholecystectomy state.  05/19/2023 TVUS impression: 1. Decreased size of posterior midline uterine intramural mass, likely leiomyoma, measuring 1.9 cm. 2. Nonvisualization of the ovaries.   GYN HISTORY: No significant history  OB History  Gravida Para Term Preterm AB Living  5 5 5   5   SAB IAB Ectopic Multiple Live Births     0 5    # Outcome Date GA Lbr Len/2nd Weight Sex Type Anes PTL Lv  5 Term 11/04/14 [redacted]w[redacted]d  7 lb 8.5 oz (3.416 kg) F Vag-Spont EPI  LIV  4 Term 03/18/08 [redacted]w[redacted]d    Vag-Spont  N LIV  3 Term 11/07/00 [redacted]w[redacted]d    Vag-Spont  N LIV  2 Term 07/09/90 [redacted]w[redacted]d     Vag-Spont  N LIV  1 Term 08/24/89 [redacted]w[redacted]d    Vag-Spont       Past Medical History:  Diagnosis Date   Abnormal Pap smear    patient states she had cancer that was removed from her cervix   Asthma    when pregnant/ once 6 yrs ago only time asthma attack the dr said   Cancer [facility])    Clotting disorder (HCC)    GERD (gastroesophageal reflux disease) 10/19/2015   Gestational diabetes    Intramural leiomyoma of uterus 02/29/2016   Pulmonary embolism (HCC)    Renal insufficiency    Vitamin D deficiency    Vitamin D deficiency     Past Surgical History:  Procedure Laterality Date   CHOLECYSTECTOMY      Current Outpatient Medications on File Prior to Visit  Medication Sig Dispense Refill   aluminum-magnesium hydroxide-simethicone (MAALOX) 200-200-20 MG/5ML SUSP Take 15 mLs by mouth 4 (four) times daily -  before meals and at bedtime. (Patient not taking: Reported on 06/05/2023) 355 mL 0   benzocaine (ORAJEL) 10 % mucosal gel Use as directed 1 Application in the mouth or throat as needed for mouth pain. (Patient not taking: Reported on 06/05/2023) 7 g 0   cyclobenzaprine (FLEXERIL) 10 MG tablet Take 1 tablet (10 mg total) by mouth 2 (two) times daily as needed for muscle spasms. (Patient not taking: Reported on 06/05/2023) 20 tablet 0   esomeprazole (NEXIUM) 20 MG capsule Take 1 capsule (20 mg total) by mouth daily. (Patient not taking: Reported  on 06/05/2023) 30 capsule 0   HYDROcodone-acetaminophen (NORCO/VICODIN) 5-325 MG tablet Take 1 tablet by mouth every 6 (six) hours as needed. (Patient not taking: Reported on 06/05/2023)     lidocaine (LIDODERM) 5 % Place 1 patch onto the skin daily. Remove & Discard patch within 12 hours or as directed by MD (Patient not taking: Reported on 06/05/2023) 30 patch 0   naproxen (NAPROSYN) 375 MG tablet Take 1 tablet (375 mg total) by mouth 2 (two) times daily. (Patient not taking: Reported on 06/05/2023) 20 tablet 0   naproxen (NAPROSYN) 375 MG tablet Take 1  tablet (375 mg total) by mouth 2 (two) times daily with a meal. (Patient not taking: Reported on 06/05/2023) 20 tablet 0   ondansetron (ZOFRAN) 4 MG tablet Take 1 tablet (4 mg total) by mouth every 6 (six) hours. (Patient not taking: Reported on 06/05/2023) 12 tablet 0   ondansetron (ZOFRAN) 4 MG tablet Take 1 tablet (4 mg total) by mouth every 8 (eight) hours as needed for nausea or vomiting. (Patient not taking: Reported on 06/05/2023) 10 tablet 0   sertraline (ZOLOFT) 100 MG tablet Take 100 mg by mouth daily. (Patient not taking: Reported on 06/05/2023)     sucralfate (CARAFATE) 1 g tablet Take 1 tablet (1 g total) by mouth 4 (four) times daily -  with meals and at bedtime for 10 days. (Patient not taking: Reported on 03/13/2023) 40 tablet 0   No current facility-administered medications on file prior to visit.    Allergies  Allergen Reactions   Latex Itching and Rash    Itching with use of condoms      PE Today's Vitals   06/05/23 1030  BP: 122/78  Pulse: 70  Temp: 97.8 F (36.6 C)  TempSrc: Oral  SpO2: 99%  Weight: 187 lb (84.8 kg)   Body mass index is 31.12 kg/m.  Physical Exam Vitals reviewed. Exam conducted with a chaperone present.  Constitutional:      General: She is not in acute distress.    Appearance: Normal appearance.  HENT:     Head: Normocephalic and atraumatic.     Nose: Nose normal.  Eyes:     Extraocular Movements: Extraocular movements intact.     Conjunctiva/sclera: Conjunctivae normal.  Pulmonary:     Effort: Pulmonary effort is normal.  Abdominal:     General: There is no distension.     Palpations: Abdomen is soft.     Tenderness: There is no abdominal tenderness. There is no guarding.  Genitourinary:    General: Normal vulva.     Exam position: Lithotomy position.     Vagina: Normal. No vaginal discharge.     Cervix: Normal. No cervical motion tenderness, discharge or lesion.     Uterus: Normal. Not enlarged and not tender.      Adnexa:  Right adnexa normal and left adnexa normal.  Musculoskeletal:        General: Normal range of motion.     Cervical back: Normal range of motion.  Neurological:     General: No focal deficit present.     Mental Status: She is alert.  Psychiatric:        Mood and Affect: Mood normal.        Behavior: Behavior normal.     Assessment and Plan:        Intramural leiomyoma of uterus From 05/19/2023 the following labs reviewed: Lipase, CMP, CBC, hCG, UA. TVUS and CT scan were also reviewed from  this day. Fibroids are smaller on ultrasound this year compared to prior imaging.  Patient is also postmenopausal.  Fibroids are likely not contributing to her abdominal pain.  Right lower quadrant abdominal pain -     Ambulatory referral to Gastroenterology However patient reports her reflux, constipation, and hemorrhoids.  Her imaging also shows signs of diverticulosis. She is also behind on her colon cancer screening.  I recommend referral to GI for management of the symptoms.  Constipation, unspecified constipation type -     Ambulatory referral to Gastroenterology Recommend increasing hydration, fiber intake, and use of stool softeners.   Rosalyn Gess, MD

## 2023-06-16 ENCOUNTER — Encounter: Payer: Self-pay | Admitting: Obstetrics and Gynecology

## 2023-06-17 DIAGNOSIS — Z419 Encounter for procedure for purposes other than remedying health state, unspecified: Secondary | ICD-10-CM | POA: Diagnosis not present

## 2023-06-22 ENCOUNTER — Encounter: Payer: Self-pay | Admitting: Physician Assistant

## 2023-07-31 ENCOUNTER — Other Ambulatory Visit: Payer: Self-pay

## 2023-07-31 ENCOUNTER — Encounter (HOSPITAL_COMMUNITY): Payer: Self-pay

## 2023-07-31 ENCOUNTER — Emergency Department (HOSPITAL_COMMUNITY)
Admission: EM | Admit: 2023-07-31 | Discharge: 2023-07-31 | Disposition: A | Discharge status: Home / Self Care | Attending: Emergency Medicine | Admitting: Emergency Medicine

## 2023-07-31 DIAGNOSIS — M2550 Pain in unspecified joint: Secondary | ICD-10-CM | POA: Insufficient documentation

## 2023-07-31 DIAGNOSIS — Z91040 Latex allergy status: Secondary | ICD-10-CM | POA: Insufficient documentation

## 2023-07-31 DIAGNOSIS — M25571 Pain in right ankle and joints of right foot: Secondary | ICD-10-CM | POA: Diagnosis not present

## 2023-07-31 DIAGNOSIS — R600 Localized edema: Secondary | ICD-10-CM | POA: Insufficient documentation

## 2023-07-31 DIAGNOSIS — M25532 Pain in left wrist: Secondary | ICD-10-CM | POA: Diagnosis not present

## 2023-07-31 DIAGNOSIS — M25531 Pain in right wrist: Secondary | ICD-10-CM | POA: Diagnosis not present

## 2023-07-31 DIAGNOSIS — M25572 Pain in left ankle and joints of left foot: Secondary | ICD-10-CM | POA: Diagnosis not present

## 2023-07-31 LAB — BASIC METABOLIC PANEL WITH GFR
Anion gap: 8 (ref 5–15)
BUN: 6 mg/dL (ref 6–20)
CO2: 23 mmol/L (ref 22–32)
Calcium: 9.1 mg/dL (ref 8.9–10.3)
Chloride: 108 mmol/L (ref 98–111)
Creatinine, Ser: 0.52 mg/dL (ref 0.44–1.00)
GFR, Estimated: >60 mL/min (ref >60)
Glucose, Bld: 96 mg/dL (ref 70–99)
Potassium: 3.8 mmol/L (ref 3.5–5.1)
Sodium: 139 mmol/L (ref 135–145)

## 2023-07-31 LAB — CBC
HCT: 38.1 % (ref 36.0–46.0)
Hemoglobin: 13 g/dL (ref 12.0–15.0)
MCH: 30.9 pg (ref 26.0–34.0)
MCHC: 34.1 g/dL (ref 30.0–36.0)
MCV: 90.5 fL (ref 80.0–100.0)
Platelets: 307 10*3/uL (ref 150–400)
RBC: 4.21 MIL/uL (ref 3.87–5.11)
RDW: 12.5 % (ref 11.5–15.5)
WBC: 5.4 10*3/uL (ref 4.0–10.5)
nRBC: 0 % (ref 0.0–0.2)

## 2023-07-31 MED ORDER — CELECOXIB 200 MG PO CAPS: 200.0000 mg | ORAL_CAPSULE | Freq: Two times a day (BID) | ORAL | 0 refills | Status: DC

## 2023-07-31 MED ORDER — KETOROLAC TROMETHAMINE 30 MG/ML IJ SOLN
30.0000 mg | Freq: Once | INTRAMUSCULAR | Status: AC
  Administered 2023-07-31: 30 mg via INTRAMUSCULAR
  Filled 2023-07-31: qty 1

## 2023-07-31 NOTE — Discharge Instructions (Addendum)
 Your blood work was normal.  I believe you are suffering from pain and inflammation of the joints.  Take medication as prescribed and with food.  Follow-up with your primary doctor for further evaluation.

## 2023-07-31 NOTE — ED Provider Notes (Signed)
 [place] EMERGENCY DEPARTMENT AT [facility], [place] Provider Note   CSN: [id number] Arrival date & time: 07/31/23  1330     History  Chief Complaint  Patient presents with   Numbness    [facility] [name] is a 53 y.o. female.  HPI   53 year old female who is Spanish-speaking, interpreter services used, presents emergency department with multiple/generalized complaints.  Patient states for the past couple days she is having pain in her bilateral extremities involving hands, elbows, shoulders, upper back, bilateral knees.  She feels like her bilateral wrist and ankles are slightly swollen today.  No trauma.  She is taken 1 dose of ibuprofen with only slight relief.  She denies any fevers, no bug bites.  No history of any autoimmune disorder.  She denies any headache, weakness/numbness.  Denies calf swelling/tenderness.  Home Medications Prior to Admission medications   Medication Sig Start Date End Date Taking? Authorizing Provider  aluminum-magnesium hydroxide-simethicone (MAALOX) 200-200-20 MG/5ML SUSP Take 15 mLs by mouth 4 (four) times daily -  before meals and at bedtime. Patient not taking: Reported on 06/05/2023 04/13/22   Guy Sandifer L, PA  benzocaine (ORAJEL) 10 % mucosal gel Use as directed 1 Application in the mouth or throat as needed for mouth pain. Patient not taking: Reported on 06/05/2023 11/23/21   Doran Stabler, DO  cyclobenzaprine (FLEXERIL) 10 MG tablet Take 1 tablet (10 mg total) by mouth 2 (two) times daily as needed for muscle spasms. Patient not taking: Reported on 06/05/2023 03/13/23   Elayne Snare K, DO  esomeprazole (NEXIUM) 20 MG capsule Take 1 capsule (20 mg total) by mouth daily. Patient not taking: Reported on 06/05/2023 04/13/22   Guy Sandifer L, PA  HYDROcodone-acetaminophen (NORCO/VICODIN) 5-325 MG tablet Take 1 tablet by mouth every 6 (six) hours as needed. Patient not taking: Reported on 06/05/2023 03/13/15   [provider]   lidocaine (LIDODERM) 5 % Place 1 patch onto the skin daily. Remove & Discard patch within 12 hours or as directed by MD Patient not taking: Reported on 06/05/2023 03/13/23   Elayne Snare K, DO  naproxen (NAPROSYN) 375 MG tablet Take 1 tablet (375 mg total) by mouth 2 (two) times daily. Patient not taking: Reported on 06/05/2023 07/16/21   Marita Kansas, PA-C  naproxen (NAPROSYN) 375 MG tablet Take 1 tablet (375 mg total) by mouth 2 (two) times daily with a meal. Patient not taking: Reported on 06/05/2023 05/19/23   Arthor Captain, PA-C  ondansetron (ZOFRAN) 4 MG tablet Take 1 tablet (4 mg total) by mouth every 6 (six) hours. Patient not taking: Reported on 06/05/2023 03/13/23   Elayne Snare K, DO  ondansetron (ZOFRAN) 4 MG tablet Take 1 tablet (4 mg total) by mouth every 8 (eight) hours as needed for nausea or vomiting. Patient not taking: Reported on 06/05/2023 05/19/23   Arthor Captain, PA-C  sertraline (ZOLOFT) 100 MG tablet Take 100 mg by mouth daily. Patient not taking: Reported on 06/05/2023 01/05/15   [provider]  sucralfate (CARAFATE) 1 g tablet Take 1 tablet (1 g total) by mouth 4 (four) times daily -  with meals and at bedtime for 10 days. Patient not taking: Reported on 03/13/2023 04/13/22 04/23/22  Guy Sandifer L, PA      Allergies    Latex    Review of Systems   Review of Systems  Constitutional:  Negative for fever.  Respiratory:  Negative for shortness of breath.   Cardiovascular:  Negative for chest pain.  Gastrointestinal:  Negative for abdominal pain, diarrhea and vomiting.  Musculoskeletal:  Positive for arthralgias and back pain.  Skin:  Negative for rash.  Neurological:  Negative for weakness, numbness and headaches.    Physical Exam Updated Vital Signs BP 116/78 (BP Location: Left Arm)   Pulse 62   Temp 98 F (36.7 C) (Oral)   Resp 16   Ht 5\' 5"  (1.651 m)   Wt 84.8 kg   LMP 11/23/2020   SpO2 100%   BMI 31.12 kg/m  Physical Exam Vitals  and nursing note reviewed.  Constitutional:      General: She is not in acute distress.    Appearance: Normal appearance. She is not ill-appearing.  HENT:     Head: Normocephalic.     Mouth/Throat:     Mouth: Mucous membranes are moist.  Cardiovascular:     Rate and Rhythm: Normal rate.  Pulmonary:     Effort: Pulmonary effort is normal. No respiratory distress.  Abdominal:     Palpations: Abdomen is soft.     Tenderness: There is no abdominal tenderness.  Musculoskeletal:     Comments: Possible slight edema of the dorsal aspect of the bilateral wrists as well as slight edema around the bilateral lateral malleolus, very subtle, unclear if this is acute, otherwise no pitting edema.  Pulses are equal in all 4 extremities, no discoloration of the feet/hands, sensory intact, strength equal.  Skin:    General: Skin is warm.  Neurological:     Mental Status: She is alert and oriented to person, place, and time. Mental status is at baseline.  Psychiatric:        Mood and Affect: Mood normal.     ED Results / Procedures / Treatments   Labs (all labs ordered are listed, but only abnormal results are displayed) Labs Reviewed  CBC  BASIC METABOLIC PANEL WITH GFR    EKG None  Radiology No results found.  Procedures Procedures    Medications Ordered in ED Medications  ketorolac (TORADOL) 30 MG/ML injection 30 mg (30 mg Intramuscular Given 07/31/23 1749)    ED Course/ Medical Decision Making/ A&P                                 Medical Decision Making Risk Prescription drug management.   53 year old female presents emergency department with generalized joint pain and subjective swelling.  This is involving hands, elbows, shoulders, knees.  She has full range of motion, is neurovascularly intact, vitals are normal.  Blood work shows no acute abnormality.  After shot of Toradol she feels significantly improved.  Unclear the etiology but I do not see any significant findings  of emergent pathology.  Will plan to treat symptomatically and refer to primary doctor for further evaluation.  Patient at this time appears safe and stable for discharge and close outpatient follow up. Discharge plan and strict return to ED precautions discussed, patient verbalizes understanding and agreement.        Final Clinical Impression(s) / ED Diagnoses Final diagnoses:  None    Rx / DC Orders ED Discharge Orders     None         Rozelle Logan, DO 07/31/23 2037

## 2023-07-31 NOTE — ED Provider Triage Note (Signed)
 Emergency Medicine Provider Triage Evaluation Note  [facility] Kim [name] , a 53 y.o. female  was evaluated in triage.  Pt complains of joint pain. Pain in bilateral hands, knees, elbows, shoulders, feet. No trauma. Has taken ibuprofen one time, slight relief. No fevers. No tick exposure. No numbness.   Review of Systems  Positive:  Negative:   Physical Exam  BP 125/81   Pulse 62   Temp 98.1 F (36.7 C) (Oral)   Resp 17   Ht 5\' 5"  (1.651 m)   Wt 84.8 kg   LMP 11/23/2020   SpO2 99%   BMI 31.12 kg/m  Gen:   Awake, no distress   Resp:  Normal effort  MSK:   Moves extremities without difficulty  Other:    Medical Decision Making  Medically screening exam initiated at 2:07 PM.  Appropriate orders placed.  [facility] Kim [name] was informed that the remainder of the evaluation will be completed by another provider, this initial triage assessment does not replace that evaluation, and the importance of remaining in the ED until their evaluation is complete.     Adel Aden, PA-C 07/31/23 1408

## 2023-07-31 NOTE — ED Triage Notes (Signed)
 Patient reports traveled 4 hours on Friday and when she took a bath noticed bilateral legs were red below the knee and had numbness.  Also reports swelling in hands and feet associated with numbness and joint pain.

## 2023-08-07 ENCOUNTER — Ambulatory Visit: Payer: Self-pay | Admitting: Internal Medicine

## 2023-08-07 VITALS — BP 119/74 | HR 57 | Temp 98.2°F | Ht 65.0 in | Wt 189.0 lb

## 2023-08-07 DIAGNOSIS — M2550 Pain in unspecified joint: Secondary | ICD-10-CM

## 2023-08-07 MED ORDER — DICLOFENAC SODIUM 1 % EX GEL: 2.0000 g | Freq: Four times a day (QID) | CUTANEOUS | 1 refills | Status: DC | PRN

## 2023-08-07 NOTE — Patient Instructions (Addendum)
  Fue un placer atenderle hoy!  Le he pedido varias pruebas de laboratorio hoy y le informar qu muestran y cules son nuestros prximos pasos.  Le he enviado un gel que puede aplicar [employer] articulaciones doloridas hasta 4 veces al da, segn sea necesario.  Tambin puede tomar ibuprofeno segn sea necesario para Chief Technology Officer.  Por favor, programe una cita de seguimiento en las prximas 2 o 3 semanas.  Atentamente, Dr. Rozelle Corning

## 2023-08-07 NOTE — Progress Notes (Unsigned)
 CC: polyarthralgia ED f/u  HPI:  [name] is a 53 y.o. female with past medical history as detailed below who presents for ED f/u for polyarthralgia. Please see problem based charting for detailed assessment and plan.  Past Medical History:  Diagnosis Date   Abnormal Pap smear    patient states she had cancer that was removed from her cervix   Asthma    when pregnant/ once 6 yrs ago only time asthma attack the dr said   Cancer [facility])    Clotting disorder (HCC)    GERD (gastroesophageal reflux disease) 10/19/2015   Gestational diabetes    Intramural leiomyoma of uterus 02/29/2016   Pulmonary embolism (HCC)    Renal insufficiency    Vitamin D  deficiency    Vitamin D  deficiency    Review of Systems:  Negative unless otherwise stated.  Physical Exam:  Vitals:   08/07/23 1456  BP: 119/74  Pulse: (!) 57  Temp: 98.2 F (36.8 C)  TempSrc: Oral  SpO2: 99%  Weight: 189 lb (85.7 kg)  Height: 5\' 5"  (1.651 m)   Constitutional:Appears stated age, well. In no acute distress. Cardio:Regular rate and rhythm. No murmurs, rubs, or gallops. Pulm:Clear to auscultation bilaterally. Normal work of breathing on room air. MSK/Skin:Swelling over bilateral dorsal wrists and lateral ankles. No bony deformities or point tenderness over fingers, hands, wrists, shoulders, spine, ankles. No rashes or areas of increased warmth or erythema. Neuro:No focal deficit noted. Psych:Pleasant mood and affect.  Assessment & Plan:   See Encounters Tab for problem based charting.  Polyarthralgia Patient presents with complaint of widespread joint pain over the last year or so involving bilateral hands/fingers/wrists, shoulders, knees, and spine. Initially she thought that her symptoms were due to menopause. The pain comes and goes, and at times involves swelling of the joints notably her wrists, knees, and ankles. Prior to onset about a year ago, she has never experienced this before. She notes  that she did have some redness with the swelling of her knees but does not describe a rash. Over the last week her pain was severe enough that she was unable to bend her fingers or knees. Her mood is not great but she doesn't feel depressed. There are no focal tender points. She does not have trouble with activities that require her arms to be above her head or with standing from sitting position. She has some "bone pain" when it's very cold and occasional palpitations but no other thyroid  related symptoms. She does have occasional tingling sensation in her arms. Her pain is better now than initial presentation to ED last week.  Exam largely benign with exception of swelling over dorsal wrists and lateral ankles.  Assessment: Given duration of symptoms I have less suspicion for infectious arthritis such as gonococcal arthritis. No GI symptoms to suggest IBD related disease or recent viral illnesses or exam findings to suggest reactive arthritis. Low suspicion for fibromyalgia but could consider if work-up unrevealing. No known personal or family history of autoimmune disease however give polyarthralgia presentation and persistent swelling of the dorsal wrists and lateral ankles I do want to investigate rheumatologic causes of her symptoms. Plan: Will initiate investigation for autoimmune causes of her pain with CBC, CMP, ESR, CRP, RF, ANA w/ reflex. I will send in capsaicin cream for her to use daily and refill celebrex  which was prescribed in ED and helping with her symptoms. F/u in 3 weeks to assess pain, discuss further work up if  needed.  Patient discussed with Dr. Broadus Canes

## 2023-08-08 DIAGNOSIS — M2550 Pain in unspecified joint: Secondary | ICD-10-CM | POA: Insufficient documentation

## 2023-08-08 NOTE — Assessment & Plan Note (Signed)
 Patient presents with complaint of widespread joint pain over the last year or so involving bilateral hands/fingers/wrists, shoulders, knees, and spine. Initially she thought that her symptoms were due to menopause. The pain comes and goes, and at times involves swelling of the joints notably her wrists, knees, and ankles. Prior to onset about a year ago, she has never experienced this before. She notes that she did have some redness with the swelling of her knees but does not describe a rash. Over the last week her pain was severe enough that she was unable to bend her fingers or knees. Her mood is not great but she doesn't feel depressed. There are no focal tender points. She does not have trouble with activities that require her arms to be above her head or with standing from sitting position. She has some "bone pain" when it's very cold and occasional palpitations but no other thyroid  related symptoms. She does have occasional tingling sensation in her arms. Her pain is better now than initial presentation to ED last week.  Exam largely benign with exception of swelling over dorsal wrists and lateral ankles.  Assessment: Given duration of symptoms I have less suspicion for infectious arthritis such as gonococcal arthritis. No GI symptoms to suggest IBD related disease or recent viral illnesses or exam findings to suggest reactive arthritis. Low suspicion for fibromyalgia but could consider if work-up unrevealing. No known personal or family history of autoimmune disease however give polyarthralgia presentation and persistent swelling of the dorsal wrists and lateral ankles I do want to investigate rheumatologic causes of her symptoms. Plan: Will initiate investigation for autoimmune causes of her pain with CBC, CMP, ESR, CRP, RF, ANA w/ reflex. I will send in capsaicin cream for her to use daily and refill celebrex  which was prescribed in ED and helping with her symptoms. F/u in 3 weeks to assess pain,  discuss further work up if needed.

## 2023-08-09 ENCOUNTER — Ambulatory Visit: Admitting: Physician Assistant

## 2023-08-09 ENCOUNTER — Other Ambulatory Visit: Payer: Self-pay | Admitting: Student

## 2023-08-09 ENCOUNTER — Other Ambulatory Visit (INDEPENDENT_AMBULATORY_CARE_PROVIDER_SITE_OTHER)

## 2023-08-09 ENCOUNTER — Encounter: Payer: Self-pay | Admitting: Physician Assistant

## 2023-08-09 VITALS — BP 118/64 | HR 66 | Ht 65.0 in | Wt 185.5 lb

## 2023-08-09 DIAGNOSIS — R1013 Epigastric pain: Secondary | ICD-10-CM

## 2023-08-09 DIAGNOSIS — G8929 Other chronic pain: Secondary | ICD-10-CM | POA: Diagnosis not present

## 2023-08-09 DIAGNOSIS — R1031 Right lower quadrant pain: Secondary | ICD-10-CM

## 2023-08-09 DIAGNOSIS — R1310 Dysphagia, unspecified: Secondary | ICD-10-CM | POA: Diagnosis not present

## 2023-08-09 DIAGNOSIS — K219 Gastro-esophageal reflux disease without esophagitis: Secondary | ICD-10-CM

## 2023-08-09 DIAGNOSIS — K625 Hemorrhage of anus and rectum: Secondary | ICD-10-CM | POA: Diagnosis not present

## 2023-08-09 LAB — TSH: TSH: 1.76 u[IU]/mL (ref 0.35–5.50)

## 2023-08-09 MED ORDER — FAMOTIDINE 40 MG PO TABS: 40.0000 mg | ORAL_TABLET | Freq: Every day | ORAL | 0 refills | Status: DC

## 2023-08-09 MED ORDER — NA SULFATE-K SULFATE-MG SULF 17.5-3.13-1.6 GM/177ML PO SOLN: 1.0000 | Freq: Once | ORAL | 0 refills | Status: AC

## 2023-08-09 MED ORDER — DICYCLOMINE HCL 20 MG PO TABS: 20.0000 mg | ORAL_TABLET | Freq: Three times a day (TID) | ORAL | 0 refills | Status: DC | PRN

## 2023-08-09 NOTE — Progress Notes (Signed)
 08/09/2023 [facility] Samul Croft [id number] 12-24-1970  Referring provider: Adria Hopkins, MD Primary GI doctor: Dr. Yvone Herd  ASSESSMENT AND PLAN:    Right lower quadrant abdominal pain, history of alternating diarrhea/constipation, rectal bleeding thought to be due to hemorrhoids per patient 05/19/2023 CT abdomen pelvis with contrast nonobstructive 3 mm left renal stone, sigmoid diverticulosis no inflammation, focal rounded hyperdensity uterine fundus may present fibroid (pelvic ultrasound showed likely leiomyoma), mild prominence intra and extrahepatic biliary tree likely relates to postcholecystectomy state, 1 cm cyst peripheral right hepatic lobe 08/07/2023 sed rate CRP negative, CBC with no anemia no leukocytosis, normal liver, kidney No history of colon cancer -Check celiac panel -Can do trial of IBGARD daily, will give Levsin as needed in the mean time  -FODMAP,  and lifestyle changes discussed Will schedule for endoscopic evaluation, discussed with patient and agrees with plan. Risk of bowel prep, conscious sedation, and -EGD and colonoscopy were discussed.  Risks include but are not limited to dehydration, pain, bleeding, cardiopulmonary process, bowel perforation, or other possible adverse outcomes..  Treatment plan was discussed with patient, and agreed upon.  -Consider SIBO testing or xifaxin trial pending results  GERD with epigastric pain, rare dysphagia with pills S/p cholecystectomy  Maalox helps, on nexium  x Jan Rare NSAIDS 1-2 x a week but has been taking more frequently due to joint pain for last year -Lifestyle changes discussed, avoid NSAIDS, ETOH, hand out given to the patient -Will get Diatherix stool antigen for H. Pylori I discussed risks of EGD with patient today, including risk of sedation, bleeding or perforation.  Patient provides understanding and gave verbal consent to proceed. -consider RUQ US  and consider HIDA  Joint pain, hand and feet Neg  sed rate and CRP Continue follow up with rheum  Patient Care Team: Adria Hopkins, MD as PCP - General  HISTORY OF PRESENT ILLNESS: 53 y.o. Spanish-speaking female with a past medical history listed below presents for evaluation of right lower quadrant abdominal pain.  Was never had colonoscopy  Discussed the use of AI scribe software for clinical note transcription with the patient, who gave verbal consent to proceed.  History of Present Illness   [facility] Samul Croft is a 53 year old female who presents with abdominal pain and gastrointestinal symptoms.  She has been experiencing abdominal pain for several months, which prompted a visit to the emergency room. An MRI and endoscopic procedure revealed small fibroids, but a gynecologist assessed them as too small to cause significant pain. Additionally, her right intestine appeared slightly smaller than normal.  She has a history of hemorrhoids and reports changes in bowel habits, including alternating diarrhea and constipation, along with blood in her stool. She has never undergone a colonoscopy or evaluation for colon cancer. There is no known family history of colon cancer.  She experiences long-standing heartburn, managed with melox and a medication for reflux, providing some relief. She occasionally experiences dysphagia, particularly with pills.  She has been taking pain medications like naproxen  and previously Celebrex  for joint pain, which has been ongoing for about a year. She reports increased frequency of pain medication use due to joint pain. She experiences occasional shortness of breath, particularly when walking, and attributes it to joint issues. Joint pain and swelling in her feet and hands have affected her mobility.  She reports difficulty sleeping over the past three days due to pain, stating 'I almost don't sleep.'        She  reports that  she has never smoked. She has never used smokeless tobacco. She reports  current alcohol use. She reports that she does not use drugs.  RELEVANT GI HISTORY, IMAGING AND LABS: Results   RADIOLOGY Pelvic MRI: Myomas observed CT scan (05/19/2023): Fibroid observed; no significant abnormalities Pelvic ultrasound with transvaginal: Fibroid observed      CBC    Component Value Date/Time   WBC 6.1 08/07/2023 1554   WBC 5.4 07/31/2023 1408   RBC 4.30 08/07/2023 1554   RBC 4.21 07/31/2023 1408   HGB 13.5 08/07/2023 1554   HCT 39.9 08/07/2023 1554   PLT 217 08/07/2023 1554   MCV 93 08/07/2023 1554   MCH 31.4 08/07/2023 1554   MCH 30.9 07/31/2023 1408   MCHC 33.8 08/07/2023 1554   MCHC 34.1 07/31/2023 1408   RDW 12.7 08/07/2023 1554   LYMPHSABS 3.6 (H) 12/28/2020 1642   MONOABS 469 08/14/2015 1010   EOSABS 0.2 12/28/2020 1642   BASOSABS 0.1 12/28/2020 1642   Recent Labs    05/19/23 0600 07/31/23 1408 08/07/23 1554  HGB 13.1 13.0 13.5    CMP     Component Value Date/Time   NA 139 08/07/2023 1554   K 4.7 08/07/2023 1554   CL 105 08/07/2023 1554   CO2 19 (L) 08/07/2023 1554   GLUCOSE 82 08/07/2023 1554   GLUCOSE 96 07/31/2023 1408   GLUCOSE 79 06/19/2014 1129   BUN 24 08/07/2023 1554   CREATININE 0.49 (L) 08/07/2023 1554   CREATININE 0.48 (L) 10/24/2017 0951   CALCIUM 9.3 08/07/2023 1554   PROT 7.1 08/07/2023 1554   ALBUMIN 4.4 08/07/2023 1554   AST 18 08/07/2023 1554   ALT 15 08/07/2023 1554   ALKPHOS 105 08/07/2023 1554   BILITOT 0.5 08/07/2023 1554   GFRNONAA >60 07/31/2023 1408   GFRAA 133 09/02/2019 1612      Latest Ref Rng & Units 08/07/2023    3:54 PM 05/19/2023    6:00 AM 09/02/2019    4:12 PM  Hepatic Function  Total Protein 6.0 - 8.5 g/dL 7.1  6.7  7.2   Albumin 3.8 - 4.9 g/dL 4.4  3.5  4.1   AST 0 - 40 IU/L 18  32  18   ALT 0 - 32 IU/L 15  25  12    Alk Phosphatase 44 - 121 IU/L 105  83  106   Total Bilirubin 0.0 - 1.2 mg/dL 0.5  0.9  0.3       Current Medications:      Current Outpatient Medications (Analgesics):     celecoxib  (CELEBREX ) 200 MG capsule, Take 1 capsule (200 mg total) by mouth 2 (two) times daily.   HYDROcodone -acetaminophen  (NORCO/VICODIN) 5-325 MG tablet, Take 1 tablet by mouth every 6 (six) hours as needed. (Patient not taking: Reported on 08/09/2023)   naproxen  (NAPROSYN ) 375 MG tablet, Take 1 tablet (375 mg total) by mouth 2 (two) times daily. (Patient not taking: Reported on 03/13/2023)   naproxen  (NAPROSYN ) 375 MG tablet, Take 1 tablet (375 mg total) by mouth 2 (two) times daily with a meal. (Patient not taking: Reported on 08/09/2023)   Current Outpatient Medications (Other):    aluminum -magnesium  hydroxide-simethicone  (MAALOX) 200-200-20 MG/5ML SUSP, Take 15 mLs by mouth 4 (four) times daily -  before meals and at bedtime.   diclofenac  Sodium (VOLTAREN  ARTHRITIS PAIN) 1 % GEL, Apply 2 g topically 4 (four) times daily as needed (for pain).   dicyclomine  (BENTYL ) 20 MG tablet, Take 1 tablet (20 mg total)  by mouth 3 (three) times daily as needed for spasms.   esomeprazole  (NEXIUM ) 20 MG capsule, Take 1 capsule (20 mg total) by mouth daily.   famotidine  (PEPCID ) 40 MG tablet, Take 1 tablet (40 mg total) by mouth at bedtime.   Na Sulfate-K Sulfate-Mg Sulfate concentrate (SUPREP) 17.5-3.13-1.6 GM/177ML SOLN, Take 1 kit (354 mLs total) by mouth once for 1 dose.   benzocaine  (ORAJEL) 10 % mucosal gel, Use as directed 1 Application in the mouth or throat as needed for mouth pain. (Patient not taking: Reported on 08/09/2023)   cyclobenzaprine  (FLEXERIL ) 10 MG tablet, Take 1 tablet (10 mg total) by mouth 2 (two) times daily as needed for muscle spasms. (Patient not taking: Reported on 08/09/2023)   lidocaine  (LIDODERM ) 5 %, Place 1 patch onto the skin daily. Remove & Discard patch within 12 hours or as directed by MD (Patient not taking: Reported on 08/09/2023)   ondansetron  (ZOFRAN ) 4 MG tablet, Take 1 tablet (4 mg total) by mouth every 6 (six) hours. (Patient not taking: Reported on 08/09/2023)    ondansetron  (ZOFRAN ) 4 MG tablet, Take 1 tablet (4 mg total) by mouth every 8 (eight) hours as needed for nausea or vomiting. (Patient not taking: Reported on 08/09/2023)   sertraline  (ZOLOFT ) 100 MG tablet, Take 100 mg by mouth daily. (Patient not taking: Reported on 08/09/2023)   sucralfate  (CARAFATE ) 1 g tablet, Take 1 tablet (1 g total) by mouth 4 (four) times daily -  with meals and at bedtime for 10 days. (Patient not taking: Reported on 03/13/2023)  Medical History:  Past Medical History:  Diagnosis Date   Abnormal Pap smear    patient states she had cancer that was removed from her cervix   Asthma    when pregnant/ once 6 yrs ago only time asthma attack the dr said   Cancer [facility])    Clotting disorder (HCC)    Constipation    GERD (gastroesophageal reflux disease) 10/19/2015   Gestational diabetes    Hemorrhoids    Intramural leiomyoma of uterus 02/29/2016   Pulmonary embolism (HCC)    Renal insufficiency    Vitamin D  deficiency    Vitamin D  deficiency    Allergies:  Allergies  Allergen Reactions   Latex Itching and Rash    Itching with use of condoms     Surgical History:  She  has a past surgical history that includes Cholecystectomy. Family History:  Her family history includes Birth defects in an other family member; Diabetes in her brother, father, and mother; Drug abuse in her paternal grandmother; Heart disease in her sister; Hyperlipidemia in her brother and father; Hypertension in her father and mother; Kidney disease in an other family member.  REVIEW OF SYSTEMS  : All other systems reviewed and negative except where noted in the History of Present Illness.  PHYSICAL EXAM: BP 118/64   Pulse 66   Ht 5\' 5"  (1.651 m)   Wt 185 lb 8 oz (84.1 kg)   LMP 11/23/2020   SpO2 98%   BMI 30.87 kg/m  Physical Exam   GENERAL APPEARANCE: Well nourished, in no apparent distress. HEENT: No cervical lymphadenopathy, unremarkable thyroid , sclerae anicteric, conjunctiva  pink. RESPIRATORY: Respiratory effort normal, breath sounds equal bilaterally without rales, rhonchi, or wheezing. Lungs clear to auscultation bilaterally. CARDIO: Regular rate and rhythm with no murmurs, rubs, or gallops, peripheral pulses intact. ABDOMEN: Soft, non-distended, active bowel sounds in all four quadrants, tenderness to palpation, no rebound, no mass appreciated. RECTAL:  Declines. MUSCULOSKELETAL: Full range of motion, normal gait, without edema. SKIN: Dry, intact without rashes or lesions. No jaundice. NEURO: Alert, oriented, no focal deficits. PSYCH: Cooperative, normal mood and affect.      Edmonia Gottron, PA-C 3:10 PM

## 2023-08-09 NOTE — Patient Instructions (Addendum)
 Your provider has requested that you go to the basement level for lab work before leaving today. Press "B" on the elevator. The lab is located at the first door on the left as you exit the elevator.  Due to recent changes in healthcare laws, you may see the results of your imaging and laboratory studies on MyChart before your provider has had a chance to review them.  We understand that in some cases there may be results that are confusing or concerning to you. Not all laboratory results come back in the same time frame and the provider may be waiting for multiple results in order to interpret others.  Please give us  48 hours in order for your provider to thoroughly review all the results before contacting the office for clarification of your results.   You have been scheduled for an endoscopy and colonoscopy. Please follow the written instructions given to you at your visit today.  If you use inhalers (even only as needed), please bring them with you on the day of your procedure.  DO NOT TAKE 7 DAYS PRIOR TO TEST- Trulicity (dulaglutide) Ozempic, Wegovy (semaglutide) Mounjaro (tirzepatide) Bydureon Bcise (exanatide extended release)  DO NOT TAKE 1 DAY PRIOR TO YOUR TEST Rybelsus (semaglutide) Adlyxin (lixisenatide) Victoza (liraglutide) Byetta (exanatide) ___________________________________________________________________________   We have sent the following medications to your pharmacy for you to pick up at your convenience: Famotidine  40mg , take one tablet at bedtime.  Dicyclomine  20mg  tablet, three times a day as needed.  Thank you for trusting me with your gastrointestinal care!   Santina Cull, PA-C  _______________________________________________________  If your blood pressure at your visit was 140/90 or greater, please contact your primary care physician to follow up on this.  _______________________________________________________  If you are age 93 or older, your body  mass index should be between 23-30. Your Body mass index is 30.87 kg/m. If this is out of the aforementioned range listed, please consider follow up with your Primary Care Provider.  If you are age 28 or younger, your body mass index should be between 19-25. Your Body mass index is 30.87 kg/m. If this is out of the aformentioned range listed, please consider follow up with your Primary Care Provider.   ________________________________________________________  The [place] [facility] providers would like to encourage you to use MYCHART to communicate with providers for non-urgent requests or questions.  Due to long hold times on the telephone, sending your provider a message by [facility] may be a faster and more efficient way to get a response.  Please allow 48 business hours for a response.  Please remember that this is for non-urgent requests.  _______________________________________________________    Alex Hylan para el sndrome de colon irritable Diet for Irritable Bowel Syndrome Cuando se tiene sndrome de colon irritable (SCI), es muy importante seguir los hbitos alimenticios que sean mejores para la afeccin. El SCI puede causar varios sntomas, Education officer, environmental en el abdomen, estreimiento o diarrea. Elegir los alimentos adecuados puede ayudar a Paramedic las molestias causadas por estos sntomas. Trabaje con el mdico y el nutricionista para Contractor plan de alimentacin que ayudar a Chief Operating Officer los sntomas. Cules son algunos consejos para seguir este plan?  Lleve un registro de los [employer] come. Esto ayudar a Orthoptist causan sntomas. Escriba los siguientes datos: Qu come y cundo lo come. Qu sntomas tiene. Cundo ocurren los sntomas en relacin con las comidas; por ejemplo, "dolor en el abdomen 2 horas despus de la cena". Coma  lentamente y en un ambiente distendido. Intente consumir 5 o 6 comidas pequeas por da. No omita comidas. Beber suficiente lquido como  para Pharmacologist la orina de color amarillo plido. Pregntele al mdico si debe tomar un probitico de venta libre para ayudar a recuperar las bacterias beneficiosas en el intestino (tubo digestivo). Los probiticos son alimentos que contienen bacterias y levaduras beneficiosas. El nutricionista puede hacerle recomendaciones alimentarias especficas dependiendo de los sntomas que [place]. El nutricionista puede recomendarle que: Evite los alimentos que le ocasionen sntomas. Hable con el nutricionista sobre otras formas de [employer] mismos nutrientes que contienen estos alimentos problemticos. Evite los alimentos con gluten. El gluten es una protena que se encuentra en el centeno, el trigo y la cebada. Consuma ms alimentos con fibra soluble. Algunos alimentos con alto contenido de fibra soluble son la avena, las semillas y ciertas frutas y verduras. Tome un suplemento de fibra si se lo indic el nutricionista. Reduzca o evite ciertos alimentos que reciben el nombre de FODMAP (oligosacridos, disacridos, monosacridos y polioles fermentables). Son alimentos que contienen azcares difciles de digerir para International aid/development worker. Pregntele al mdico cules alimentos evitar. Qu alimentos debo evitar? Los siguientes son algunos alimentos y bebidas que pueden hacer que los sntomas empeoren: Alimentos grasos, como las papas fritas. Alimentos que contienen gluten, como las pastas y los cereales. Productos lcteos, como la [place], el queso y [place]. Comidas condimentadas. Alcohol. Productos que contengan cafena, [place], t o chocolate. Bebidas gaseosas, como refrescos. Alimentos con alto contenido de FODMAP. Algunos de estos alimentos son las frutas y las verduras. Productos con endulzantes tales como la miel, el jarabe de maz de alta fructosa, el sorbitol y el manitol. Es posible que los productos que se enumeran ms [place] no constituyan una lista completa de los alimentos y las bebidas que  Personnel officer. Consulte a un nutricionista para obtener ms informacin. Qu alimentos son buenas fuentes de [place]? El mdico o el nutricionista pueden recomendarle que consuma ms alimentos que [place]. La fibra puede ayudar a Acupuncturist estreimiento y otros sntomas del SCI. Poco a poco, incorpore a la dieta alimentos que [employer] para que el organismo pueda acostumbrarse a ellos. El exceso de [place] de una vez puede causar gases e hinchazn en el abdomen. Los siguientes son algunos alimentos que son buenas fuentes de fibra: Los frutos rojos, como las frambuesas, las fresas y [place]. Tomates. [place]an[place]orias. Arroz integral. [place]. Las semillas, como las de cha y [place]. Es posible que los alimentos mencionados anteriormente no conformen una lista completa de fuentes de [place] recomendadas. Comunquese con el nutricionista para conocer ms opciones. Dnde obtener ms informacin International Foundation for [employer] Gastrointestinal Disorders (Fundacin Internacional para los Trastornos Gastrointestinales Funcionales): aboutibs.[employer] of Diabetes and Digestive and Kidney Diseases [employer] de la Diabetes y las Enfermedades Digestivas y Renales): StageSync.si Resumen Cuando se tiene sndrome de colon irritable (SCI), es muy importante seguir los hbitos alimenticios que sean mejores para la afeccin. El SCI puede causar varios sntomas, Education officer, environmental en el abdomen, estreimiento o diarrea. Elegir los alimentos adecuados puede ayudar a Paramedic las molestias causadas por los sntomas. El mdico o el nutricionista pueden recomendarle que consuma ms alimentos que [place]. Lleve un registro de los [employer] come. Esto ayudar a Orthoptist causan sntomas. Esta informacin no tiene Theme park manager el consejo del mdico. Asegrese de hacerle al mdico cualquier pregunta que tenga. Document Revised: 04/15/2021 Document  Reviewed: 04/15/2021  Elsevier Patient Education  2024 [employer].

## 2023-08-09 NOTE — Telephone Encounter (Signed)
 Pt was seen in the ED in 07/31/23 and this med was prescribed. Pt has an appt scheduled on 5/12 with her PCP.

## 2023-08-09 NOTE — Telephone Encounter (Unsigned)
 Copied from CRM [id number]. Topic: Clinical - Medication Refill >> Aug 09, 2023 11:07 AM Wynona Hedger wrote: Most Recent Primary Care Visit:  Provider: Mandy Second  Department: [facility]  Visit Type: OPEN ESTABLISHED  Date: 09/21/2022  Medication: celecoxib  (CELEBREX ) 200 MG capsule  Has the patient contacted their pharmacy? Yes (Agent: If no, request that the patient contact the pharmacy for the refill. If patient does not wish to contact the pharmacy document the reason why and proceed with request.) (Agent: If yes, when and what did the pharmacy advise?)  Is this the correct pharmacy for this prescription? Yes If no, delete pharmacy and type the correct one.  This is the patient's preferred pharmacy:  [facility] [phone] Jonette Nestle, [place] - [address] [place] [address] Phone: [phone] Fax: [phone]   Has the prescription been filled recently? No  Is the patient out of the medication? Yes  Has the patient been seen for an appointment in the last year OR does the patient have an upcoming appointment? Yes  Can we respond through MyChart? Yes  Agent: Please be advised that Rx refills may take up to 3 business days. We ask that you follow-up with your pharmacy.

## 2023-08-10 LAB — TISSUE TRANSGLUTAMINASE, IGA: (tTG) Ab, IgA: <1 U/mL

## 2023-08-10 LAB — IGA: Immunoglobulin A: 406 mg/dL — ABNORMAL HIGH (ref 47–310)

## 2023-08-10 MED ORDER — CELECOXIB 200 MG PO CAPS
200.0000 mg | ORAL_CAPSULE | Freq: Two times a day (BID) | ORAL | 0 refills | Status: DC
Start: 2023-08-10 — End: 2023-09-26

## 2023-08-11 ENCOUNTER — Other Ambulatory Visit: Payer: Self-pay | Admitting: Internal Medicine

## 2023-08-11 MED ORDER — AMITRIPTYLINE HCL 10 MG PO TABS: 10.0000 mg | ORAL_TABLET | Freq: Every day | ORAL | 0 refills | Status: DC

## 2023-08-11 NOTE — Progress Notes (Signed)
 Amitriptyline  sent in. Patient has follow up visit with Dr. Abner Hoffman on May 12; can increase dose at that time if indicated. Recommend keeping broad differential despite normal autoimmune studies thus far.

## 2023-08-14 LAB — CMP14 + ANION GAP
ALT: 15 IU/L (ref 0–32)
AST: 18 IU/L (ref 0–40)
Albumin: 4.4 g/dL (ref 3.8–4.9)
Alkaline Phosphatase: 105 IU/L (ref 44–121)
Anion Gap: 15 mmol/L (ref 10.0–18.0)
BUN/Creatinine Ratio: 49 — ABNORMAL HIGH (ref 9–23)
BUN: 24 mg/dL (ref 6–24)
Bilirubin Total: 0.5 mg/dL (ref 0.0–1.2)
CO2: 19 mmol/L — ABNORMAL LOW (ref 20–29)
Calcium: 9.3 mg/dL (ref 8.7–10.2)
Chloride: 105 mmol/L (ref 96–106)
Creatinine, Ser: 0.49 mg/dL — ABNORMAL LOW (ref 0.57–1.00)
Globulin, Total: 2.7 g/dL (ref 1.5–4.5)
Glucose: 82 mg/dL (ref 70–99)
Potassium: 4.7 mmol/L (ref 3.5–5.2)
Sodium: 139 mmol/L (ref 134–144)
Total Protein: 7.1 g/dL (ref 6.0–8.5)
eGFR: 113 mL/min/{1.73_m2} (ref >59)

## 2023-08-14 LAB — CBC
Hematocrit: 39.9 % (ref 34.0–46.6)
Hemoglobin: 13.5 g/dL (ref 11.1–15.9)
MCH: 31.4 pg (ref 26.6–33.0)
MCHC: 33.8 g/dL (ref 31.5–35.7)
MCV: 93 fL (ref 79–97)
Platelets: 217 10*3/uL (ref 150–450)
RBC: 4.3 x10E6/uL (ref 3.77–5.28)
RDW: 12.7 % (ref 11.7–15.4)
WBC: 6.1 10*3/uL (ref 3.4–10.8)

## 2023-08-14 LAB — SEDIMENTATION RATE: Sed Rate: 15 mm/h (ref 0–40)

## 2023-08-14 LAB — RHEUMATOID FACTOR: Rheumatoid fact SerPl-aCnc: <10 [IU]/mL (ref <14.0)

## 2023-08-14 LAB — C-REACTIVE PROTEIN: CRP: 1 mg/L (ref 0–10)

## 2023-08-14 LAB — ANTINUCLEAR ANTIBODIES, IFA: ANA Titer 1: NEGATIVE

## 2023-08-16 NOTE — Progress Notes (Signed)
 Internal Medicine Clinic Attending  Case discussed with the resident at the time of the visit.  We reviewed the resident's history and exam and pertinent patient test results.  I agree with the assessment, diagnosis, and plan of care documented in the resident's note.

## 2023-08-21 ENCOUNTER — Other Ambulatory Visit: Payer: Self-pay | Admitting: Physician Assistant

## 2023-08-28 ENCOUNTER — Ambulatory Visit: Admitting: Student

## 2023-08-28 VITALS — BP 106/61 | HR 51 | Temp 97.6°F | Ht 65.0 in | Wt 190.5 lb

## 2023-08-28 DIAGNOSIS — M2550 Pain in unspecified joint: Secondary | ICD-10-CM | POA: Diagnosis not present

## 2023-08-28 DIAGNOSIS — F064 Anxiety disorder due to known physiological condition: Secondary | ICD-10-CM

## 2023-08-28 DIAGNOSIS — F410 Panic disorder [episodic paroxysmal anxiety] without agoraphobia: Secondary | ICD-10-CM

## 2023-08-28 DIAGNOSIS — G479 Sleep disorder, unspecified: Secondary | ICD-10-CM | POA: Diagnosis not present

## 2023-08-28 DIAGNOSIS — Z1211 Encounter for screening for malignant neoplasm of colon: Secondary | ICD-10-CM

## 2023-08-28 NOTE — Patient Instructions (Addendum)
 Llame en julio para programar una cita en noviembre (o cerca de esa fecha) con el Dr. Abner Hoffman.  Para el dolor, puede tomar Tylenol  1000 mg hasta 4 veces al [employer]. Si el dolor es intenso, tome ibuprofeno 400 mg cada 4 horas.  Recuerde traer [employer] que toma (incluidos los de venta libre y los suplementos) a cada visita a Glass blower/designer.  Este resumen posterior a la visita es una revisin importante de las pruebas, las derivaciones y los cambios de medicacin que se comentaron durante su visita. Si tiene alguna pregunta o inquietud, llame al [phone]. Fuera del horario de atencin de Glass blower/designer, llame al hospital principal al [phone] y pregunte a la operadora por el residente de [place] interna de [place].   Adria Hopkins MD 08/28/2023, 9:54 AM

## 2023-08-28 NOTE — Assessment & Plan Note (Signed)
 Has appointment with GI upcoming for this.

## 2023-08-28 NOTE — Assessment & Plan Note (Signed)
 Improved since last visit.  Prior labs and notes reviewed.  Workup for autoimmune arthritis was negative.

## 2023-08-28 NOTE — Assessment & Plan Note (Signed)
 Continues to have anxiety around her health and medical conditions.  She has comorbid low mood.  She is taking amitriptyline  with some beneficial effect.  She says she has a therapist in the community that helps her.  Today she is concerned about sleep trouble.  I recommended good sleep hygiene, including using bed for only sleep and sex.  She should get out of bed if she is laying awake ruminating.  Occasionally she takes Benadryl  for allergies at nighttime and this also has a beneficial side effect of helping her fall asleep.

## 2023-08-28 NOTE — Progress Notes (Signed)
 Patient name: [facility] Samul Croft Date of birth: 1971/03/05 Date of visit: 08/28/23  Subjective  Chief Complaint  Patient presents with   Follow-up    HPI [facility] [place] is here for follow-up.  Since her last visit, her joint pain has improved. She has been worked up for auto-immune causes of joint pain. That workup was negative.  She saw gastroenterology and they tested her for celiac disease due to abdominal pain and systemic symptoms.  She is concerned about a bump on her back. She has had it for a year. It is growing. Someone tried squeezing it and sounds like some pus was expressed.  She's also having trouble sleeping. She is taking Benadryl  for allergies, which she takes at nighttime because it also helps her sleep. She goes to bed around 11 pm and wakes up at 6:15 am. It takes her sometimes an hour or more to fall asleep. She doesn't have phones or TV in room. Her bedtime routine involves household work. She has a busy household.  Review of Systems  Constitutional:  Positive for malaise/fatigue. Negative for weight loss.  Respiratory:  Negative for cough and shortness of breath.   Gastrointestinal:  Positive for diarrhea. Negative for abdominal pain, constipation and vomiting.  Genitourinary:  Negative for frequency.  Musculoskeletal:  Negative for falls.  Psychiatric/Behavioral:  Positive for depression. The patient is nervous/anxious. The patient does not have insomnia.     Patient Active Problem List   Diagnosis Date Noted   Polyarthralgia 08/08/2023   Traveler's diarrhea 09/21/2022   Mouth pain 11/23/2021   TMJ (sprain of temporomandibular joint), initial encounter 12/29/2020   Anxiety disorder due to general medical condition with panic attack 01/31/2020   Cerebral aneurysm without rupture 01/31/2020   Intramural leiomyoma of uterus 02/29/2016   Need for immunization against influenza 01/28/2015   Hx of pulmonary embolus during pregnancy 11/09/2014    History of gestational diabetes    Screen for colon cancer    Past Medical History:  Diagnosis Date   Abnormal Pap smear    patient states she had cancer that was removed from her cervix   Asthma    when pregnant/ once 6 yrs ago only time asthma attack the dr said   Cancer [facility])    Clotting disorder (HCC)    Constipation    GERD (gastroesophageal reflux disease) 10/19/2015   Gestational diabetes    Hemorrhoids    Intramural leiomyoma of uterus 02/29/2016   Pulmonary embolism (HCC)    Renal insufficiency    Vitamin D  deficiency    Vitamin D  deficiency    Outpatient Encounter Medications as of 08/28/2023  Medication Sig   aluminum -magnesium  hydroxide-simethicone  (MAALOX) 200-200-20 MG/5ML SUSP Take 15 mLs by mouth 4 (four) times daily -  before meals and at bedtime.   amitriptyline  (ELAVIL ) 10 MG tablet Take 1 tablet (10 mg total) by mouth at bedtime.   celecoxib  (CELEBREX ) 200 MG capsule Take 1 capsule (200 mg total) by mouth 2 (two) times daily.   diclofenac  Sodium (VOLTAREN  ARTHRITIS PAIN) 1 % GEL Apply 2 g topically 4 (four) times daily as needed (for pain).   dicyclomine  (BENTYL ) 20 MG tablet TAKE 1 TABLET (20 MG TOTAL) BY MOUTH 3 (THREE) TIMES DAILY AS NEEDED FOR SPASMS.   esomeprazole  (NEXIUM ) 20 MG capsule Take 1 capsule (20 mg total) by mouth daily.   famotidine  (PEPCID ) 40 MG tablet Take 1 tablet (40 mg total) by mouth at bedtime.   [DISCONTINUED] benzocaine  (  ORAJEL) 10 % mucosal gel Use as directed 1 Application in the mouth or throat as needed for mouth pain. (Patient not taking: Reported on 08/09/2023)   [DISCONTINUED] cyclobenzaprine  (FLEXERIL ) 10 MG tablet Take 1 tablet (10 mg total) by mouth 2 (two) times daily as needed for muscle spasms. (Patient not taking: Reported on 08/09/2023)   [DISCONTINUED] HYDROcodone -acetaminophen  (NORCO/VICODIN) 5-325 MG tablet Take 1 tablet by mouth every 6 (six) hours as needed. (Patient not taking: Reported on 08/09/2023)   [DISCONTINUED]  lidocaine  (LIDODERM ) 5 % Place 1 patch onto the skin daily. Remove & Discard patch within 12 hours or as directed by MD (Patient not taking: Reported on 08/09/2023)   [DISCONTINUED] ondansetron  (ZOFRAN ) 4 MG tablet Take 1 tablet (4 mg total) by mouth every 6 (six) hours. (Patient not taking: Reported on 08/09/2023)   [DISCONTINUED] ondansetron  (ZOFRAN ) 4 MG tablet Take 1 tablet (4 mg total) by mouth every 8 (eight) hours as needed for nausea or vomiting. (Patient not taking: Reported on 08/09/2023)   [DISCONTINUED] sertraline  (ZOLOFT ) 100 MG tablet Take 100 mg by mouth daily. (Patient not taking: Reported on 08/09/2023)   [DISCONTINUED] sucralfate  (CARAFATE ) 1 g tablet Take 1 tablet (1 g total) by mouth 4 (four) times daily -  with meals and at bedtime for 10 days. (Patient not taking: Reported on 03/13/2023)   No facility-administered encounter medications on file as of 08/28/2023.   Past Surgical History:  Procedure Laterality Date   CHOLECYSTECTOMY     Family History  Problem Relation Age of Onset   Diabetes Mother    Hypertension Mother    Hyperlipidemia Father    Diabetes Father    Hypertension Father    Heart disease Sister    Diabetes Brother    Hyperlipidemia Brother    Drug abuse Paternal Grandmother    Kidney disease Other    Birth defects Other        anal atresia/stenosis   Social History   Socioeconomic History   Marital status: Married    Spouse name: Not on file   Number of children: 5   Years of education: Not on file   Highest education level: Not on file  Occupational History   Occupation: Stay at home mom  Tobacco Use   Smoking status: Never   Smokeless tobacco: Never  Vaping Use   Vaping status: Never Used  Substance and Sexual Activity   Alcohol use: Yes    Alcohol/week: 0.0 standard drinks of alcohol    Comment: Occasionally.   Drug use: No   Sexual activity: Yes    Partners: Male    Birth control/protection: Condom  Other Topics Concern   Not on  file  Social History Narrative   Married with five children. Came to [place] from [place] in 2001. Unemployed currently. Spanish speaking.   Social Drivers of Corporate investment banker Strain: Not on file  Food Insecurity: Not on file  Transportation Needs: Not on file  Physical Activity: Not on file  Stress: Not on file  Social Connections: Unknown (08/27/2021)   Received from [facility], [facility]   Social Network    Social Network: Not on file  Intimate Partner Violence: Unknown (07/19/2021)   Received from [facility], [facility]   HITS    Physically Hurt: Not on file    Insult or Talk Down To: Not on file    Threaten Physical Harm: Not on file    Scream or Curse: Not on file  Objective  Today's Vitals   08/28/23 0913  BP: 106/61  Pulse: (!) 51  Temp: 97.6 F (36.4 C)  TempSrc: Oral  SpO2: 98%  Weight: 190 lb 8 oz (86.4 kg)  Height: 5\' 5"  (1.651 m)  Body mass index is 31.7 kg/m.   Physical Exam Constitutional:      General: She is not in acute distress.    Appearance: Normal appearance.  HENT:     Right Ear: Tympanic membrane, ear canal and external ear normal.     Left Ear: Tympanic membrane, ear canal and external ear normal.     Mouth/Throat:     Mouth: Mucous membranes are moist.  Eyes:     Extraocular Movements: Extraocular movements intact.     Conjunctiva/sclera: Conjunctivae normal.     Pupils: Pupils are equal, round, and reactive to light.  Neck:     Thyroid : No thyroid  mass, thyromegaly or thyroid  tenderness.     Vascular: No carotid bruit.  Cardiovascular:     Rate and Rhythm: Normal rate and regular rhythm.     Pulses: Normal pulses.     Heart sounds: No murmur heard. Pulmonary:     Effort: Pulmonary effort is normal.     Breath sounds: Normal breath sounds. No wheezing or rales.  Abdominal:     Palpations: Abdomen is soft. There is no hepatomegaly, splenomegaly or mass.     Tenderness: There is no abdominal tenderness.   Musculoskeletal:     Right lower leg: No edema.     Left lower leg: No edema.  Lymphadenopathy:     Cervical: No cervical adenopathy.  Skin:    General: Skin is warm and dry.  Neurological:     Mental Status: She is alert. Mental status is at baseline.     Cranial Nerves: No facial asymmetry.     Motor: No tremor.     Deep Tendon Reflexes: Reflexes normal.  Psychiatric:        Mood and Affect: Mood normal.        Behavior: Behavior normal.      Assessment & Plan  Problem List Items Addressed This Visit     Screen for colon cancer   Has appointment with GI upcoming for this.      Anxiety disorder due to general medical condition with panic attack (Chronic)   Continues to have anxiety around her health and medical conditions.  She has comorbid low mood.  She is taking amitriptyline  with some beneficial effect.  She says she has a therapist in the community that helps her.  Today she is concerned about sleep trouble.  I recommended good sleep hygiene, including using bed for only sleep and sex.  She should get out of bed if she is laying awake ruminating.  Occasionally she takes Benadryl  for allergies at nighttime and this also has a beneficial side effect of helping her fall asleep.      Polyarthralgia (Chronic)   Improved since last visit.  Prior labs and notes reviewed.  Workup for autoimmune arthritis was negative.      Other Visit Diagnoses       Sleep trouble    -  Primary      Return in about 6 months (around 02/28/2024) for routine follow-up.  Adria Hopkins MD 08/28/2023, 1:10 PM

## 2023-08-29 NOTE — Progress Notes (Signed)
 Internal Medicine Clinic Attending  Case discussed with the resident at the time of the visit.  We reviewed the resident's history and exam and pertinent patient test results.  I agree with the assessment, diagnosis, and plan of care documented in the resident's note.

## 2023-08-31 ENCOUNTER — Other Ambulatory Visit: Payer: Self-pay | Admitting: Physician Assistant

## 2023-09-03 ENCOUNTER — Other Ambulatory Visit: Payer: Self-pay | Admitting: Student

## 2023-09-05 NOTE — Progress Notes (Signed)
 [facility] History and Physical   Primary Care Physician:  Adria Hopkins, MD   Reason for Procedure:  GERD, epigastric abdominal pain, heartburn, dysphagia, right lower quadrant abdominal pain, diarrhea, constipation, rectal bleeding  Plan:    Upper endoscopy and colonoscopy   HPI: [facility] is a 53 y.o. female undergoing upper endoscopy and colonoscopy for evaluation and management of GERD, epigastric abdominal pain, heartburn, dysphagia, right lower quadrant abdominal pain, diarrhea, constipation and rectal bleeding.  Patient reports a longstanding history of heartburn and intermittent dysphagia with pills.  She has had epigastric abdominal pain.  Takes naproxen  and previously Celebrex  for joint pain.  Has a history of hemorrhoids and reports change in bowel habit as outlined above.  She has never had a colonoscopy.  No family history of colon cancer.   Past Medical History:  Diagnosis Date   Abnormal Pap smear    patient states she had cancer that was removed from her cervix   Asthma    when pregnant/ once 6 yrs ago only time asthma attack the dr said   Cancer [facility])    Clotting disorder (HCC)    Constipation    GERD (gastroesophageal reflux disease) 10/19/2015   Gestational diabetes    Hemorrhoids    Intramural leiomyoma of uterus 02/29/2016   Pulmonary embolism (HCC)    Renal insufficiency    Vitamin D  deficiency    Vitamin D  deficiency     Past Surgical History:  Procedure Laterality Date   CHOLECYSTECTOMY      Prior to Admission medications   Medication Sig Start Date End Date Taking? Authorizing Provider  aluminum -magnesium  hydroxide-simethicone  (MAALOX) 200-200-20 MG/5ML SUSP Take 15 mLs by mouth 4 (four) times daily -  before meals and at bedtime. 04/13/22   Crain, Whitney L, PA  amitriptyline  (ELAVIL ) 10 MG tablet Take 1 tablet (10 mg total) by mouth at bedtime. 08/11/23   Malen Scudder, DO  celecoxib  (CELEBREX ) 200 MG capsule Take 1  capsule (200 mg total) by mouth 2 (two) times daily. 08/10/23   Adria Hopkins, MD  diclofenac  Sodium (VOLTAREN  ARTHRITIS PAIN) 1 % GEL Apply 2 g topically 4 (four) times daily as needed (for pain). 08/07/23   Malen Scudder, DO  dicyclomine  (BENTYL ) 20 MG tablet TAKE 1 TABLET (20 MG TOTAL) BY MOUTH 3 (THREE) TIMES DAILY AS NEEDED FOR SPASMS. 08/21/23   Edmonia Gottron, PA-C  esomeprazole  (NEXIUM ) 20 MG capsule Take 1 capsule (20 mg total) by mouth daily. 04/13/22   Crain, Laurina Popper L, PA  famotidine  (PEPCID ) 40 MG tablet TAKE 1 TABLET BY MOUTH EVERYDAY AT BEDTIME 08/31/23   Collier, Amanda R, PA-C    Current Outpatient Medications  Medication Sig Dispense Refill   aluminum -magnesium  hydroxide-simethicone  (MAALOX) 200-200-20 MG/5ML SUSP Take 15 mLs by mouth 4 (four) times daily -  before meals and at bedtime. 355 mL 0   amitriptyline  (ELAVIL ) 10 MG tablet TAKE 1 TABLET BY MOUTH EVERYDAY AT BEDTIME 90 tablet 1   celecoxib  (CELEBREX ) 200 MG capsule Take 1 capsule (200 mg total) by mouth 2 (two) times daily. 30 capsule 0   diclofenac  Sodium (VOLTAREN  ARTHRITIS PAIN) 1 % GEL Apply 2 g topically 4 (four) times daily as needed (for pain). 100 g 1   dicyclomine  (BENTYL ) 20 MG tablet TAKE 1 TABLET (20 MG TOTAL) BY MOUTH 3 (THREE) TIMES DAILY AS NEEDED FOR SPASMS. 50 tablet 0   esomeprazole  (NEXIUM ) 20 MG capsule Take 1 capsule (20 mg total) by mouth  daily. 30 capsule 0   famotidine  (PEPCID ) 40 MG tablet TAKE 1 TABLET BY MOUTH EVERYDAY AT BEDTIME 90 tablet 3   Current Facility-Administered Medications  Medication Dose Route Frequency Provider Last Rate Last Admin   0.9 %  sodium chloride  infusion  500 mL Intravenous Once Oriyah Lamphear, Scarlette Currier, MD        Allergies as of 09/06/2023 - Review Complete 09/06/2023  Allergen Reaction Noted   Latex Itching and Rash 02/23/2015    Family History  Problem Relation Age of Onset   Diabetes Mother    Hypertension Mother    Hyperlipidemia Father    Diabetes Father     Hypertension Father    Heart disease Sister    Diabetes Brother    Hyperlipidemia Brother    Drug abuse Paternal Grandmother    Kidney disease Other    Birth defects Other        anal atresia/stenosis   Uterine cancer Neg Hx    Ulcerative colitis Neg Hx    Esophageal cancer Neg Hx    Colon cancer Neg Hx     Social History   Socioeconomic History   Marital status: Married    Spouse name: Not on file   Number of children: 5   Years of education: Not on file   Highest education level: Not on file  Occupational History   Occupation: Stay at home mom  Tobacco Use   Smoking status: Never   Smokeless tobacco: Never  Vaping Use   Vaping status: Never Used  Substance and Sexual Activity   Alcohol use: Yes    Alcohol/week: 0.0 standard drinks of alcohol    Comment: Occasionally.   Drug use: No   Sexual activity: Yes    Partners: Male    Birth control/protection: Condom  Other Topics Concern   Not on file  Social History Narrative   Married with five children. Came to [place] from [place] in 2001. Unemployed currently. Spanish speaking.   Social Drivers of Corporate investment banker Strain: Not on file  Food Insecurity: Not on file  Transportation Needs: Not on file  Physical Activity: Not on file  Stress: Not on file  Social Connections: Unknown (08/27/2021)   Received from [facility], [facility]   Social Network    Social Network: Not on file  Intimate Partner Violence: Unknown (07/19/2021)   Received from [facility], [facility]   HITS    Physically Hurt: Not on file    Insult or Talk Down To: Not on file    Threaten Physical Harm: Not on file    Scream or Curse: Not on file    Review of Systems:  All other review of systems negative except as mentioned in the HPI.  Physical Exam: Vital signs BP 130/87   Pulse 60   Temp 99 F (37.2 C)   Resp 12   Ht 5\' 5"  (1.651 m)   Wt 190 lb (86.2 kg)   LMP 11/23/2020   SpO2 100%   BMI 31.62 kg/m    General:   Alert,  Well-developed, well-nourished, pleasant and cooperative in NAD Airway:  Mallampati 1 Lungs:  Clear throughout to auscultation.   Heart:  Regular rate and rhythm; no murmurs, clicks, rubs,  or gallops. Abdomen:  Soft, nontender and nondistended. Normal bowel sounds.   Neuro/Psych:  Normal mood and affect. A and O x 3  Eugenia Hess, MD [facility]

## 2023-09-06 ENCOUNTER — Ambulatory Visit (AMBULATORY_SURGERY_CENTER): Admitting: Pediatrics

## 2023-09-06 ENCOUNTER — Encounter: Payer: Self-pay | Admitting: Pediatrics

## 2023-09-06 ENCOUNTER — Other Ambulatory Visit: Payer: Self-pay | Admitting: Internal Medicine

## 2023-09-06 VITALS — BP 108/55 | HR 50 | Temp 99.0°F | Resp 15 | Ht 65.0 in | Wt 190.0 lb

## 2023-09-06 DIAGNOSIS — K6389 Other specified diseases of intestine: Secondary | ICD-10-CM | POA: Diagnosis not present

## 2023-09-06 DIAGNOSIS — K2970 Gastritis, unspecified, without bleeding: Secondary | ICD-10-CM

## 2023-09-06 DIAGNOSIS — K5900 Constipation, unspecified: Secondary | ICD-10-CM | POA: Diagnosis not present

## 2023-09-06 DIAGNOSIS — R1310 Dysphagia, unspecified: Secondary | ICD-10-CM | POA: Diagnosis not present

## 2023-09-06 DIAGNOSIS — R1013 Epigastric pain: Secondary | ICD-10-CM

## 2023-09-06 DIAGNOSIS — B9681 Helicobacter pylori [H. pylori] as the cause of diseases classified elsewhere: Secondary | ICD-10-CM

## 2023-09-06 DIAGNOSIS — G8929 Other chronic pain: Secondary | ICD-10-CM

## 2023-09-06 DIAGNOSIS — K648 Other hemorrhoids: Secondary | ICD-10-CM

## 2023-09-06 DIAGNOSIS — J45909 Unspecified asthma, uncomplicated: Secondary | ICD-10-CM | POA: Diagnosis not present

## 2023-09-06 DIAGNOSIS — K2289 Other specified disease of esophagus: Secondary | ICD-10-CM

## 2023-09-06 DIAGNOSIS — K2950 Unspecified chronic gastritis without bleeding: Secondary | ICD-10-CM | POA: Diagnosis not present

## 2023-09-06 DIAGNOSIS — K625 Hemorrhage of anus and rectum: Secondary | ICD-10-CM

## 2023-09-06 DIAGNOSIS — R197 Diarrhea, unspecified: Secondary | ICD-10-CM | POA: Diagnosis not present

## 2023-09-06 DIAGNOSIS — K219 Gastro-esophageal reflux disease without esophagitis: Secondary | ICD-10-CM | POA: Diagnosis not present

## 2023-09-06 MED ORDER — SODIUM CHLORIDE 0.9 % IV SOLN: 500.0000 mL | Freq: Once | INTRAVENOUS | Status: DC

## 2023-09-06 NOTE — Progress Notes (Signed)
 Interpreter used today at the [facility] for this pt.  Interpreter's name is-amarilla sanders

## 2023-09-06 NOTE — Patient Instructions (Addendum)
 -Informacion sobre gastritis y hemorroides  -Espere por los [employer] de patologia  USTED TUVO UN PROCEDIMIENTO ENDOSCPICO HOY EN EL [facility]:   Lea el informe del procedimiento que se le entreg para cualquier pregunta especfica sobre lo que se Dentist.  Si el informe del examen no responde a sus preguntas, por favor llame a su gastroenterlogo para aclararlo.  Si usted solicit que no se le den [employer] de lo que se Clinical cytogeneticist en su procedimiento al [employer] va a cuidar, entonces el informe del procedimiento se ha incluido en un sobre sellado para que usted lo revise despus cuando le sea ms conveniente.   LO QUE PUEDE ESPERAR: Algunas sensaciones de hinchazn en el abdomen.  Puede tener ms gases de lo normal.  El caminar puede ayudarle a eliminar el aire que se le puso en el tracto gastrointestinal durante el procedimiento y reducir la hinchazn.  Si le hicieron una endoscopia inferior (como una colonoscopia o una sigmoidoscopia flexible), podra notar manchas de sangre en las heces fecales o en el papel higinico.  Si se someti a una preparacin intestinal para su procedimiento, es posible que no tenga una evacuacin intestinal normal durante [employer].   Tenga en cuenta:  Es posible que note un poco de irritacin y congestin en la nariz o algn drenaje.  Esto es debido al oxgeno [employer] durante su procedimiento.  No hay que preocuparse y esto debe desaparecer ms o Regulatory affairs officer.   SNTOMAS PARA REPORTAR INMEDIATAMENTE:  Despus de una endoscopia inferior (colonoscopia o sigmoidoscopia flexible):  Cantidades excesivas de sangre en las heces fecales  Sensibilidad significativa o empeoramiento de los dolores abdominales   Hinchazn aguda del abdomen que antes no tena   Fiebre de 100F o ms   Despus de la endoscopia superior (EGD)  Vmitos de Retail buyer o material como caf molido   Dolor en el pecho o dolor debajo de los omplatos que  antes no tena   Dolor o dificultad persistente para tragar  Falta de aire que antes no tena   Fiebre de 100F o ms  Heces fecales negras y pegajosas   Para asuntos urgentes o de Associate Professor, puede comunicarse con un gastroenterlogo a cualquier hora llamando al [phone].  DIETA:  Recomendamos una comida pequea al principio, pero luego puede continuar con su dieta normal.  Tome muchos lquidos, pero debe evitar las bebidas alcohlicas durante 24 horas.    ACTIVIDAD:  Debe planear tomarse las cosas con calma por el resto del da y no debe CONDUCIR ni usar maquinaria pesada Patent examiner (debido a los medicamentos de sedacin utilizados durante el examen).     SEGUIMIENTO: Nuestro personal llamar al nmero que aparece en su historial al siguiente da hbil de su procedimiento para ver cmo se siente y para responder cualquier pregunta o inquietud que pueda tener con respecto a la informacin que se le dio despus del procedimiento. Si no podemos contactarle, le dejaremos un mensaje.  Sin embargo, si se siente bien y no tiene English as a second language teacher, no es necesario que nos devuelva la llamada.  Asumiremos que ha regresado a sus actividades diarias normales sin incidentes. Si se le tomaron algunas biopsias, le contactaremos por telfono o por carta en las prximas 3 semanas.  Si no ha sabido [employer] biopsias en el transcurso de 3 semanas, por favor llmenos al [phone].   FIRMAS/CONFIDENCIALIDAD: Usted y/o el acompaante que le cuide han firmado documentos que  se ingresarn en su historial mdico electrnico.  Estas firmas atestiguan el hecho de que la informacin anterior  Hemorroides Hemorrhoids Las hemorroides son venas hinchadas en el recto o la zona que lo rodea, o en la abertura de las nalgas (ano). Hay dos tipos de hemorroides: Internas. Se forman en las venas del interior del recto. Pueden abultarse hacia afuera, irritarse y doler. Externas. Estas se producen en las venas que  estn fuera del ano. Pueden sentirse como una hinchazn o un bulto duro dolorosos cerca del ano. La mayora de las hemorroides no causan problemas graves. A menudo, pueden tratarse en casa con cambios en la dieta y el estilo de vida. Si los tratamientos caseros no ayudan, quizs necesite un procedimiento para reducir o extirpar las hemorroides. Cules son las causas? Las hemorroides son causadas por la presin cerca del ano. Esta presin puede deberse a lo siguiente: Estreimiento o diarrea. Hace esfuerzo para eliminar heces. Embarazo. Obesidad. Estar sentado o andar en bicicleta durante mucho tiempo. Levantar objetos pesados u otras cosas que impliquen esfuerzo. Sexo anal. Cules son los signos o sntomas? Los sntomas de esta afeccin incluyen: Engineer, mining. Picazn o irritacin anal. Sangrado que proviene del recto. Fuga de materia fecal (heces). Hinchazn del ano. Uno o ms bultos alrededor del ano. Cmo se diagnostica? Las hemorroides se diagnostican frecuentemente a travs de un examen visual. Posiblemente le realicen otros tipos de pruebas o estudios, como los siguientes: Psychiatrist. Esto lo realiza el mdico mediante tacto rectal con un dedo enguantado. Anoscopia. Este es un examen del ano que se hace con un tubo pequeo. Anlisis de sangre si ha perdido [employer]. Sigmoidoscopa o colonoscopa. Estos son estudios para observar el interior del colon a travs de un tubo que tiene una cmara en el extremo. Cmo se trata? En la [employer], las hemorroides se pueden tratar en casa con cambios en la dieta y el estilo de vida. Si estos cambios no resultan eficaces, tal vez deba someterse a un procedimiento. Estos procedimientos pueden reducir las hemorroides o extirparlas completamente. Algunos de los procedimientos ms frecuentes son los siguientes: Ligadura con Curator. Las bandas elsticas se colocan en la base de las hemorroides para interrumpir su  irrigacin de Deputy. Escleroterapia. Se pone un medicamento dentro de las hemorroides para reducir su tamao. Coagulacin con luz infrarroja. Se utiliza un tipo de energa lumnica para eliminar las hemorroides. Hemorroidectoma. Las hemorroides se extirpan durante la [place]. Luego, las venas que las irrigan se atan para Geologist, engineering. Hemorroidopexia con grapas. La base de las hemorroides se engrapa a la pared del recto. Siga estas instrucciones en su casa: Medicamentos Use los medicamentos de venta libre y los recetados solamente como se lo haya indicado el mdico. Use cremas medicadas o medicamentos medicinales que se ponen en el recto (supositorios) como se lo haya indicado el mdico. Comida y bebida  Consumir alimentos ricos en fibra, como frijoles, cereales integrales, y frutas y verduras frescas. Pregntele a su mdico acerca de tomar productos con fibra aadida (suplementos de [place]). Disminuya la cantidad de grasa de la dieta. Esto se puede lograr consumiendo productos lcteos con bajo contenido de grasas, ingiriendo menor cantidad de carnes rojas y evitando los alimentos procesados. Beber suficiente lquido para Radio producer pis (orina) de color amarillo plido. Control del dolor y la hinchazn  Tome baos de asiento tibios durante 20 minutos, 3 a 4 veces por Futures trader. Esto puede ayudar a Glass blower/designer y Environmental health practitioner. Puede hacer esto  en una baera o usar un dispositivo porttil para bao de asiento que se coloca sobre el inodoro. Si se lo indican, aplique hielo en la zona afectada. El uso de compresas de hielo entre baos de asiento puede ser de ayuda. Ponga el hielo en una bolsa plstica. Coloque una toalla entre la piel y la bolsa. Aplique el hielo durante 20 minutos, 2 a 3 veces por da. Si la piel se le pone de color rojo brillante, retire el hielo de inmediato para evitar daos en la piel. El [place] de dao es mayor si no puede sentir dolor, Airline pilot o fro. Instrucciones  generales Actividad fsica. Consulte al mdico qu cantidad y qu tipo de ejercicio es mejor para usted. En general, debe realizar al menos 30 minutos de ejercicio moderado la [employer] de la semana (150 minutos cada semana). Es recomendable que pruebe con caminar, andar en bicicleta o practicar yoga. Vaya al bao cuando sienta ganas de defecar. No espere. Evite hacer esfuerzos para defecar. Mantenga el ano limpio y seco. Use papel higinico hmedo o toallitas humedecidas despus de defecar. No pase mucho tiempo sentado en el inodoro. Esto puede aumentar la afluencia de sangre y Chief Technology Officer. Dnde buscar ms informacin [employer] of Diabetes and Digestive and Kidney Diseases [employer] de la Diabetes y las Enfermedades Digestivas y Renales): [employer].si Comunquese con un mdico si: Tiene ms dolor e hinchazn que no mejoran con Scientist, research (medical). Tiene dificultad para defecar o no puede hacerlo. Siente dolor o tiene inflamacin fuera de la zona de las hemorroides. Solicite ayuda de inmediato si: Tiene sangrado del recto y no puede detenerlo. Esta informacin no tiene Theme park manager el consejo del mdico. Asegrese de hacerle al mdico cualquier pregunta que tenga. Document Revised: 01/20/2022 Document Reviewed: 01/20/2022 Elsevier Patient Education  2024 Elsevier Inc.   Gastritis en adultos Gastritis, Adult La gastritis es la inflamacin del estmago. Hay dos tipos de gastritis: Gastritis aguda. Este tipo aparece de manera repentina. Gastritis crnica. Este tipo es mucho ms frecuente. Se desarrolla lentamente y dura un largo tiempo. La gastritis se manifiesta cuando el revestimiento del estmago se debilita o se lesiona. Sin tratamiento, la gastritis puede causar sangrado y lceras estomacales. Cules son las causas? Esta afeccin puede ser causada por lo siguiente: Una infeccin. Beber alcohol en exceso. Ciertos medicamentos. Estos incluyen  corticoesteroides, antibiticos y algunos medicamentos de venta sin receta, como aspirina o ibuprofeno. Tener demasiado cido en el estmago. Tener una enfermedad del estmago. Algunas otras causas son las siguientes: Runner, broadcasting/film/video. Algunos tratamientos para el cncer (radiacin). Fumar cigarrillos o usar productos que contengan nicotina o tabaco. En algunos casos, se desconoce la causa de esta afeccin. Qu incrementa el riesgo? Tener una enfermedad de los intestinos. Tener una enfermedad por la cual el propio sistema inmunitario ataca el organismo (enfermedad autoinmunitaria), como la enfermedad de Crohn. Usar aspirina o ibuprofeno y otros antiinflamatorios no esteroideos (AINE) para tratar otras afecciones, como enfermedades cardacas o dolor crnico. Librarian, academic. Cules son los signos o sntomas? Los sntomas de esta afeccin incluyen los siguientes: Dolor o sensacin de ardor en la parte superior del abdomen. Nuseas. Vmitos. Sensacin molesta de saciedad despus de comer. Prdida de peso. Mal aliento. Sangre en el vmito o en la materia fecal (heces). En algunos casos, no hay sntomas. Cmo se diagnostica? Esta afeccin se puede diagnosticar en funcin de sus antecedentes mdicos, un examen fsico y pruebas. Las pruebas pueden incluir las siguientes: Sus antecedentes mdicos y una  descripcin de sus sntomas. Un examen fsico. Pruebas. Estas pueden incluir las siguientes: Pruebas de [place]. Pruebas de heces. Una prueba en la que se introduce un instrumento delgado y flexible con una luz y una pequea cmara a travs del esfago hasta el estmago (endoscopa alta). Una prueba en la que se toma [place] de tejido para analizarla con un microscopio (biopsia). Cmo se trata? Esta afeccin se puede tratar con medicamentos. Los medicamentos que se utilizan varan segn la causa de la gastritis. Si la afeccin se debe a una infeccin bacteriana, pueden recetarle  medicamentos antibiticos. Si la afeccin se debe al exceso de cido estomacal, se pueden administrar medicamentos denominados bloqueadores H2, inhibidores de la bomba de protones o anticidos. El tratamiento tambin puede incluir interrumpir el uso de ciertos medicamentos como aspirina o ibuprofeno y otros antiinflamatorios no esteroideos (AINE). Siga estas instrucciones en su casa: Medicamentos Use los medicamentos de venta libre y los recetados solamente como se lo haya indicado el mdico. Si le recetaron un antibitico, tmelo como se lo haya indicado el mdico. No deje de tomar el antibitico aunque comience a sentirse mejor. Consumo de alcohol No beba alcohol si: Su mdico le indica no hacerlo. Est embarazada, puede estar embarazada o est tratando de quedar embarazada. Si bebe alcohol: Limite su uso a las siguientes medidas: De 0 a 1 medida por da para las mujeres. De 0 a 2 medidas por da para los hombres. Sepa cunta cantidad de alcohol hay en las bebidas que toma. En los [address], una medida equivale a una botella de cerveza de 12 oz (355 ml), un vaso de vino de 5 oz (148 ml) o un vaso de una bebida alcohlica de alta graduacin de 1 oz (44 ml). Instrucciones generales  Haga comidas pequeas y frecuentes Freight forwarder de comidas abundantes. Evite los alimentos y las bebidas que intensifican los sntomas. Hable con el mdico [employer] formas de controlar el estrs, Lexicographer ejercicio con regularidad o practicar respiracin profunda, meditacin o yoga. No consuma ningn producto que contenga nicotina o tabaco. Estos productos incluyen cigarrillos, tabaco para Theatre manager y aparatos de vapeo, como los cigarrillos electrnicos. Si necesita ayuda para dejar de fumar, consulte al mdico. Beba suficiente lquido como para mantener la orina de color amarillo plido. Concurra a todas las visitas de seguimiento. Esto es importante. Comunquese con un mdico si: Sus  sntomas empeoran. El dolor abdominal empeora. Los sntomas regresan despus del tratamiento. Tiene fiebre. Solicite ayuda de inmediato si: Vomita sangre o una sustancia parecida a los posos del caf. Las heces son negras o de color rojo oscuro. No puede retener los lquidos. Estos sntomas pueden representar un problema grave que constituye Radio broadcast assistant. No espere a ver si los sntomas desaparecen. Solicite atencin mdica de inmediato. Comunquese con el servicio de emergencias de su localidad (911 en los [place]). No conduzca por sus propios medios [employer] hospital. Resumen La gastritis es la inflamacin del revestimiento del estmago que puede ocurrir de Wellsite geologist repentina [place]) o desarrollarse lentamente con el tiempo (crnica). Esta afeccin se diagnostica mediante los antecedentes mdicos, un examen fsico o pruebas. Esta afeccin puede tratarse con medicamentos para tratar la infeccin o medicamentos para reducir la cantidad de cido en el estmago. Siga las instrucciones del mdico acerca de tomar medicamentos, hacer cambios en la dieta y saber cundo pedir ayuda. Esta informacin no tiene Theme park manager el consejo del mdico. Asegrese de hacerle al mdico cualquier pregunta que tenga.  Document Revised: 09/03/2020 Document Reviewed: 09/03/2020 Elsevier Patient Education  2024 [employer].

## 2023-09-06 NOTE — Progress Notes (Signed)
 Called to room to assist during endoscopic procedure.  Patient ID and intended procedure confirmed with present staff. Received instructions for my participation in the procedure from the performing physician.

## 2023-09-06 NOTE — Op Note (Signed)
 [facility] Patient Name: [facility] Samul Croft Procedure Date: 09/06/2023 2:59 PM MRN: [id number] Endoscopist: Eugenia Hess , MD, [id number] Age: 53 Referring MD:  Date of Birth: 1970/06/10 Gender: Female Account #: [id number] Procedure:                Upper GI endoscopy Indications:              Epigastric abdominal pain, Abdominal pain in the                            right lower quadrant, Dysphagia, Heartburn,                            Follow-up of gastro-esophageal reflux disease,                            Diarrhea Medicines:                Monitored Anesthesia Care Procedure:                Pre-Anesthesia Assessment:                           - Prior to the procedure, a History and Physical                            was performed, and patient medications and                            allergies were reviewed. The patient's tolerance of                            previous anesthesia was also reviewed. The risks                            and benefits of the procedure and the sedation                            options and risks were discussed with the patient.                            All questions were answered, and informed consent                            was obtained. Prior Anticoagulants: The patient has                            taken no anticoagulant or antiplatelet agents. ASA                            Grade Assessment: II - A patient with mild systemic                            disease. After reviewing the risks and benefits,  the patient was deemed in satisfactory condition to                            undergo the procedure.                           After obtaining informed consent, the endoscope was                            passed under direct vision. Throughout the                            procedure, the patient's blood pressure, pulse, and                            oxygen saturations were monitored  continuously. The                            Olympus Scope SN [id number] was introduced through the                            mouth, and advanced to the second part of duodenum.                            The upper GI endoscopy was accomplished without                            difficulty. The patient tolerated the procedure                            well. Scope In: Scope Out: Findings:                 The examined esophagus was normal.                           No endoscopic abnormality was evident in the                            esophagus to explain the patient's complaint of                            dysphagia. Biopsies were obtained from the proximal                            and distal esophagus with cold forceps for evaluate                            for possible eosinophilic esophagitis.                           Diffuse mild inflammation characterized by erythema                            was found in the gastric fundus and in the gastric  body. Biopsies were taken with a cold forceps for                            Helicobacter pylori testing.                           The gastric antrum and cardia (on retroflexion)                            were normal. Biopsies were taken with a cold                            forceps for Helicobacter pylori testing.                           The duodenal bulb and second portion of the                            duodenum were normal. Biopsies for histology were                            taken with a cold forceps for evaluation of celiac                            disease. Complications:            No immediate complications. Estimated blood loss:                            Minimal. Estimated Blood Loss:     Estimated blood loss was minimal. Impression:               - Normal esophagus.                           - No endoscopic esophageal abnormality to explain                            patient's dysphagia.  Biopsies performed to evaluate                            for eosinophilic esophagitis.                           - Gastritis. Biopsied. Evaluate for H. pylori.                           - Normal antrum and cardia. Biopsied.                           - Normal duodenal bulb and second portion of the                            duodenum. Biopsied.                           -  Biopsies were taken with a cold forceps for                            evaluation of eosinophilic esophagitis. Recommendation:           - Await pathology results.                           - Perform a colonoscopy today.                           - The findings and recommendations were discussed                            with the patient's family. Eugenia Hess, MD 09/06/2023 4:02:04 PM This report has been signed electronically.

## 2023-09-06 NOTE — Op Note (Signed)
 [facility] Patient Name: [facility] Samul Croft Procedure Date: 09/06/2023 2:59 PM MRN: [id number] Endoscopist: Eugenia Hess , MD, [id number] Age: 53 Referring MD:  Date of Birth: Jun 23, 1970 Gender: Female Account #: [id number] Procedure:                Colonoscopy Indications:              Epigastric abdominal pain, Abdominal pain in the                            right lower quadrant, Diarrhea, Rectal bleeding,                            Constipation Medicines:                Monitored Anesthesia Care Procedure:                Pre-Anesthesia Assessment:                           - Prior to the procedure, a History and Physical                            was performed, and patient medications and                            allergies were reviewed. The patient's tolerance of                            previous anesthesia was also reviewed. The risks                            and benefits of the procedure and the sedation                            options and risks were discussed with the patient.                            All questions were answered, and informed consent                            was obtained. Prior Anticoagulants: The patient has                            taken no anticoagulant or antiplatelet agents. ASA                            Grade Assessment: II - A patient with mild systemic                            disease. After reviewing the risks and benefits,                            the patient was deemed in satisfactory condition to  undergo the procedure.                           After obtaining informed consent, the colonoscope                            was passed under direct vision. Throughout the                            procedure, the patient's blood pressure, pulse, and                            oxygen saturations were monitored continuously. The                            Olympus Scope SN [id number] was  introduced through the                            anus and advanced to the terminal ileum. The                            colonoscopy was performed without difficulty. The                            patient tolerated the procedure well. The quality                            of the bowel preparation was good. The terminal                            ileum, ileocecal valve, appendiceal orifice, and                            rectum were photographed. Scope In: 3:38:51 PM Scope Out: 3:54:47 PM Scope Withdrawal Time: 0 hours 13 minutes 59 seconds  Total Procedure Duration: 0 hours 15 minutes 56 seconds  Findings:                 The perianal and digital rectal examinations were                            normal. Pertinent negatives include normal                            sphincter tone and no palpable rectal lesions.                           Normal mucosa was found in the entire colon.                            Biopsies for histology were taken with a cold                            forceps for evaluation of microscopic colitis.  The terminal ileum appeared normal. Biopsies were                            taken with a cold forceps for histology.                           Internal hemorrhoids were found during retroflexion. Complications:            No immediate complications. Estimated blood loss:                            Minimal. Estimated Blood Loss:     Estimated blood loss was minimal. Impression:               - Normal mucosa in the entire examined colon.                            Biopsied.                           - The examined portion of the ileum was normal.                            Biopsied.                           - Internal hemorrhoids. Recommendation:           - Discharge patient to home (ambulatory).                           - Await pathology results.                           - The findings and recommendations were discussed                             with the patient's family.                           - Patient has a contact number available for                            emergencies. The signs and symptoms of potential                            delayed complications were discussed with the                            patient. Return to normal activities tomorrow.                            Written discharge instructions were provided to the                            patient. Eugenia Hess, MD 09/06/2023 4:07:06 PM This report has been signed electronically.

## 2023-09-06 NOTE — Progress Notes (Signed)
 Sedate, gd SR, tolerated procedure well, VSS, report to RN

## 2023-09-07 ENCOUNTER — Telehealth: Payer: Self-pay

## 2023-09-07 NOTE — Telephone Encounter (Signed)
 Left message

## 2023-09-12 ENCOUNTER — Ambulatory Visit: Payer: Self-pay | Admitting: Pediatrics

## 2023-09-12 DIAGNOSIS — A048 Other specified bacterial intestinal infections: Secondary | ICD-10-CM

## 2023-09-12 LAB — SURGICAL PATHOLOGY

## 2023-09-13 ENCOUNTER — Encounter: Payer: Self-pay | Admitting: Student

## 2023-09-13 DIAGNOSIS — A048 Other specified bacterial intestinal infections: Secondary | ICD-10-CM | POA: Insufficient documentation

## 2023-09-13 HISTORY — DX: Other specified bacterial intestinal infections: A04.8

## 2023-09-13 MED ORDER — OMEPRAZOLE 20 MG PO CPDR: 20.0000 mg | DELAYED_RELEASE_CAPSULE | Freq: Two times a day (BID) | ORAL | 0 refills | Status: DC

## 2023-09-13 MED ORDER — BISMUTH/METRONIDAZ/TETRACYCLIN 140-125-125 MG PO CAPS: 3.0000 | ORAL_CAPSULE | Freq: Three times a day (TID) | ORAL | 0 refills | Status: DC

## 2023-09-13 NOTE — Addendum Note (Signed)
 Addended by: Canio Winokur N on: 09/13/2023 09:27 AM   Modules accepted: Orders

## 2023-09-14 ENCOUNTER — Other Ambulatory Visit (HOSPITAL_COMMUNITY): Payer: Self-pay

## 2023-09-15 ENCOUNTER — Telehealth: Payer: Self-pay

## 2023-09-15 ENCOUNTER — Other Ambulatory Visit (HOSPITAL_COMMUNITY): Payer: Self-pay

## 2023-09-15 NOTE — Telephone Encounter (Signed)
 Pharmacy Patient Advocate Encounter   Received notification from CoverMyMeds that prior authorization for Bismuth/Metronidaz/Tetracyclin 140-125-125MG  capsules is required/requested.   Insurance verification completed.   The patient is insured through [facility] [place] .   Per test claim: The current 10 day co-pay is, $4.00.  No PA needed at this time. This test claim was processed through [facility]- copay amounts may vary at other pharmacies due to pharmacy/plan contracts, or as the patient moves through the different stages of their insurance plan.     *Must be filled for BRAND Pylera

## 2023-09-15 NOTE — Telephone Encounter (Signed)
 I spoke with Dino Frank at the pharmacy and advised him that the prescription should be run for the Brand name, Pylera, and it doesn't require PA.  Chase ran the rx request again and advised that it did go through and he ordered it for the patient.

## 2023-09-26 ENCOUNTER — Other Ambulatory Visit: Payer: Self-pay | Admitting: Student

## 2023-10-11 ENCOUNTER — Ambulatory Visit: Payer: Self-pay

## 2023-10-11 NOTE — Telephone Encounter (Signed)
 FYI Only or Action Required?: FYI only for provider.  Patient was last seen in primary care on 08/28/2023 by [name], MD. Called Nurse Triage reporting Pain. Symptoms began x 3 days. Interventions attempted: OTC medications: ibuprofen . Symptoms are: unchanged.  Triage Disposition: See PCP Within 2 Weeks  Patient/caregiver understands and will follow disposition?: Yes      Copied from CRM [id number]. Topic: Clinical - Red Word Triage >> Oct 11, 2023  8:36 AM Kim [name] wrote: Red Word that prompted transfer to Nurse Triage: Patient states her back & legs are hurting. States yesterday she was in a lot of pain and this has been ongoing for about 3 days. Wants to make an appt with provider. Reason for Disposition  Back pain present > 2 weeks  Answer Assessment - Initial Assessment Questions 1. ONSET: When did the pain begin?  X 3 days  2. LOCATION: Where does it hurt? (upper, mid or lower back)     Lower back 3. SEVERITY: How bad is the pain?  (e.g., Scale 1-10; mild, moderate, or severe)   - MILD (1-3): Doesn't interfere with normal activities.    - MODERATE (4-7): Interferes with normal activities or awakens from sleep.    - SEVERE (8-10): Excruciating pain, unable to do any normal activities.      9/10 4. PATTERN: Is the pain constant? (e.g., yes, no; constant, intermittent)      Constant and increasing with movement 5. RADIATION: Does the pain shoot into your legs or somewhere else?     Bilateral leg pain 6. CAUSE:  What do you think is causing the back pain?      unknown 7. BACK OVERUSE:  Any recent lifting of heavy objects, strenuous work or exercise?     [name] 8. MEDICINES: What have you taken so far for the pain? (e.g., nothing, acetaminophen , NSAIDS)     Ibuprofen  - without relief 9. NEUROLOGIC SYMPTOMS: Do you have any weakness, numbness, or problems with bowel/bladder control?   Bilateral leg tingling and tingling in low back 10. OTHER SYMPTOMS:  Do you have any other symptoms? (e.g., fever, abdomen pain, burning with urination, blood in urine)       no 11. PREGNANCY: Is there any chance you are pregnant? When was your last menstrual period?       [name]  Pt states she jumps a lot & back started to have pain after jumping. Leg tingling started x 3 days then back became painful yesterday.  Protocols used: Back Pain-A-AH

## 2023-10-11 NOTE — Telephone Encounter (Signed)
 Pt has an appt 7/1 with Dr Renne.

## 2023-10-17 ENCOUNTER — Ambulatory Visit: Payer: Self-pay | Admitting: Student

## 2023-10-17 VITALS — BP 109/52 | HR 62 | Temp 98.2°F | Ht 65.0 in | Wt 186.8 lb

## 2023-10-17 DIAGNOSIS — A048 Other specified bacterial intestinal infections: Secondary | ICD-10-CM | POA: Diagnosis not present

## 2023-10-17 DIAGNOSIS — M2550 Pain in unspecified joint: Secondary | ICD-10-CM | POA: Diagnosis not present

## 2023-10-17 MED ORDER — DICLOFENAC SODIUM 1 % EX GEL: 4.0000 g | Freq: Four times a day (QID) | CUTANEOUS | 1 refills | Status: DC | PRN

## 2023-10-17 NOTE — Patient Instructions (Addendum)
 Gracias, [facility] [place], por permitirnos brindarle su atencin hoy. Hoy discutimos su dolor en las articulaciones y su infeccin por H. pylori. Le estoy recetando gel de Voltaren  ya que ha confirmado que le ha funcionado en el pasado. Contine tomando su celecoxib  como se discuti. Para su infeccin por H. pylori, quiero que regrese a la clnica en 2 semanas para confirmar si esa bacteria ha sido erradicada. Por favor, no tome antibiticos ni su omeprazol durante estas 2 semanas antes de regresar a la clnica. Tambin he enviado su referencia para fisioterapia, por favor mantenga su telfono a su lado, ya que ellos la llamarn para coordinar una cita.   I have ordered the following labs for you:  Lab Orders         H. pylori breath test       Tests ordered today:    Referrals ordered today:   Referral Orders         Ambulatory referral to Physical Therapy       I have ordered the following medication/changed the following medications:   Stop the following medications: Medications Discontinued During This Encounter  Medication Reason   diclofenac  Sodium (VOLTAREN  ARTHRITIS PAIN) 1 % GEL Reorder     Start the following medications: Meds ordered this encounter  Medications   diclofenac  Sodium (VOLTAREN  ARTHRITIS PAIN) 1 % GEL    Sig: Apply 4 g topically 4 (four) times daily as needed (for pain).    Dispense:  350 g    Refill:  1     Follow up: 2 weeks     Should you have any questions or concerns please call the internal medicine clinic at [phone].   Drue Lisa Grow MD 10/17/2023, 6:23 PM   [facility]

## 2023-10-17 NOTE — Assessment & Plan Note (Signed)
 The patient has experienced multiple joint pains chronically for over a year. To date, both infectious and rheumatologic evaluations have been unremarkable. Given that the patient is unemployed and remains at home most of the time, I am concerned that decreased mobility may be contributing to the worsening of her joint pain. Physical therapy may be beneficial at this stage. On examination, there are no signs of infection, swelling, or erythema noted in the shoulder, knee, or ankle joints. - Continue celecoxib  - Refill Voltaren  gel -Ambulatory referral to physical therapy

## 2023-10-17 NOTE — Assessment & Plan Note (Addendum)
 This is a 53 year old female diagnosed with H. pylori infection following several weeks of acid reflux and abdominal pain. She has successfully completed a 4-week course of Pylera and reports complete resolution of her nausea and abdominal discomfort. However, she has not yet undergone testing to confirm eradication of the infection. We discussed the options of breath and stool tests, and she agreed to return next week for a breath test. The patient has been advised to avoid PPIs and antibiotics prior to the test. Printed instructions on the preparation for the eradication test, in Spanish, were provided and reviewed with the assistance of a Spanish interpreter, and she confirmed full understanding. - Future H. pylori breath test ordered

## 2023-10-17 NOTE — Progress Notes (Signed)
 CC: Routine follow-up.  HPI:  Kim [name] is a 53 y.o. female living with a history stated below and presents today for routine follow-up visit. Please see problem based assessment and plan for additional details.  Past Medical History:  Diagnosis Date   Abnormal Pap smear    patient states she had cancer that was removed from her cervix   Asthma    when pregnant/ once 6 yrs ago only time asthma attack the dr said   Cancer [facility])    Clotting disorder (HCC)    Constipation    GERD (gastroesophageal reflux disease) 10/19/2015   Gestational diabetes    Hemorrhoids    Intramural leiomyoma of uterus 02/29/2016   Pulmonary embolism (HCC)    Renal insufficiency    Vitamin D  deficiency    Vitamin D  deficiency     Current Outpatient Medications on File Prior to Visit  Medication Sig Dispense Refill   aluminum -magnesium  hydroxide-simethicone  (MAALOX) 200-200-20 MG/5ML SUSP Take 15 mLs by mouth 4 (four) times daily -  before meals and at bedtime. 355 mL 0   amitriptyline  (ELAVIL ) 10 MG tablet TAKE 1 TABLET BY MOUTH EVERYDAY AT BEDTIME 90 tablet 1   Bismuth /Metronidaz/Tetracyclin (PYLERA) 140-125-125 MG CAPS Take 3 capsules by mouth 4 (four) times daily -  before meals and at bedtime for 10 days. 120 capsule 0   celecoxib  (CELEBREX ) 200 MG capsule TAKE 1 CAPSULE BY MOUTH TWICE A DAY 30 capsule 0   dicyclomine  (BENTYL ) 20 MG tablet TAKE 1 TABLET (20 MG TOTAL) BY MOUTH 3 (THREE) TIMES DAILY AS NEEDED FOR SPASMS. 50 tablet 0   esomeprazole  (NEXIUM ) 20 MG capsule Take 1 capsule (20 mg total) by mouth daily. 30 capsule 0   famotidine  (PEPCID ) 40 MG tablet TAKE 1 TABLET BY MOUTH EVERYDAY AT BEDTIME 90 tablet 3   omeprazole  (PRILOSEC) 20 MG capsule Take 1 capsule (20 mg total) by mouth 2 (two) times daily for 10 days. 20 capsule 0   No current facility-administered medications on file prior to visit.    Family History  Problem Relation Age of Onset   Diabetes Mother     Hypertension Mother    Hyperlipidemia Father    Diabetes Father    Hypertension Father    Heart disease Sister    Diabetes Brother    Hyperlipidemia Brother    Drug abuse Paternal Grandmother    Kidney disease Other    Birth defects Other        anal atresia/stenosis   Uterine cancer Neg Hx    Ulcerative colitis Neg Hx    Esophageal cancer Neg Hx    Colon cancer Neg Hx     Social History   Socioeconomic History   Marital status: Married    Spouse name: Not on file   Number of children: 5   Years of education: Not on file   Highest education level: Not on file  Occupational History   Occupation: Stay at home mom  Tobacco Use   Smoking status: Never   Smokeless tobacco: Never  Vaping Use   Vaping status: Never Used  Substance and Sexual Activity   Alcohol use: Yes    Alcohol/week: 0.0 standard drinks of alcohol    Comment: Occasionally.   Drug use: No   Sexual activity: Yes    Partners: Male    Birth control/protection: Condom  Other Topics Concern   Not on file  Social History Narrative   Married with five children.  Came to [place] from [place] in 2001. Unemployed currently. Spanish speaking.   Social Drivers of Corporate investment banker Strain: Not on file  Food Insecurity: Not on file  Transportation Needs: Not on file  Physical Activity: Not on file  Stress: Not on file  Social Connections: Unknown (08/27/2021)   Received [facility]   Social Network    Social Network: Not on file  Intimate Partner Violence: Unknown (07/19/2021)   Received from [facility]   HITS    Physically Hurt: Not on file    Insult or Talk Down To: Not on file    Threaten Physical Harm: Not on file    Scream or Curse: Not on file    Review of Systems: ROS negative except for what is noted on the assessment and plan.  Vitals:   10/17/23 0822  BP: (!) 109/52  Pulse: 62  Temp: 98.2 F (36.8 C)  TempSrc: Oral  SpO2: 97%  Weight: 186 lb 12.8 oz (84.7 kg)   Height: 5' 5 (1.651 m)    Physical Exam: Constitutional: well-appearing woman, sitting in chair , in no acute distress Cardiovascular: regular rate and rhythm, no m/r/g Pulmonary/Chest: normal work of breathing on room air, Abdominal: soft, non-tender, non-distended MSK: normal bulk and tone,there are no signs of infection, swelling, or erythema noted in the shoulder, knee, or ankle joints. There are no tenderness along the spine Neurological: alert & oriented x 3, no focal deficit Skin: warm and dry Psych: normal mood and behavior  Assessment & Plan:   Helicobacter pylori (H. pylori) infection This is a 53 year old female diagnosed with H. pylori infection following several weeks of acid reflux and abdominal pain. She has successfully completed a 4-week course of Pylera and reports complete resolution of her nausea and abdominal discomfort. However, she has not yet undergone testing to confirm eradication of the infection. We discussed the options of breath and stool tests, and she agreed to return next week for a breath test. The patient has been advised to avoid PPIs and antibiotics prior to the test. Printed instructions on the preparation for the eradication test, in Spanish, were provided and reviewed with the assistance of a Spanish interpreter, and she confirmed full understanding. - Future H. pylori breath test ordered  Polyarthralgia The patient has experienced multiple joint pains chronically for over a year. To date, both infectious and rheumatologic evaluations have been unremarkable. Given that the patient is unemployed and remains at home most of the time, I am concerned that decreased mobility may be contributing to the worsening of her joint pain. Physical therapy may be beneficial at this stage. On examination, there are no signs of infection, swelling, or erythema noted in the shoulder, knee, or ankle joints. - Continue celecoxib  - Refill Voltaren  gel -Ambulatory referral  to physical therapy   Patient discussed with Dr. Lovie Drue Grow, M.D [facility] Phone: [phone] Date 10/17/2023 Time 6:21 PM

## 2023-10-18 NOTE — Progress Notes (Signed)
 Internal Medicine Clinic Attending  Case discussed with the resident at the time of the visit.  We reviewed the resident's history and exam and pertinent patient test results.  I agree with the assessment, diagnosis, and plan of care documented in the resident's note.   Patient will need to be off PPI for 2 weeks prior to H pylori testing

## 2023-10-28 DIAGNOSIS — Z419 Encounter for procedure for purposes other than remedying health state, unspecified: Secondary | ICD-10-CM | POA: Diagnosis not present

## 2023-10-30 ENCOUNTER — Other Ambulatory Visit: Payer: Self-pay

## 2023-10-30 ENCOUNTER — Encounter: Payer: Self-pay | Admitting: Physical Therapy

## 2023-10-30 ENCOUNTER — Ambulatory Visit: Attending: Internal Medicine | Admitting: Physical Therapy

## 2023-10-30 DIAGNOSIS — M25561 Pain in right knee: Secondary | ICD-10-CM | POA: Insufficient documentation

## 2023-10-30 DIAGNOSIS — M5459 Other low back pain: Secondary | ICD-10-CM | POA: Insufficient documentation

## 2023-10-30 DIAGNOSIS — M2550 Pain in unspecified joint: Secondary | ICD-10-CM | POA: Diagnosis not present

## 2023-10-30 DIAGNOSIS — M6281 Muscle weakness (generalized): Secondary | ICD-10-CM | POA: Diagnosis not present

## 2023-10-30 DIAGNOSIS — M25571 Pain in right ankle and joints of right foot: Secondary | ICD-10-CM | POA: Insufficient documentation

## 2023-10-30 DIAGNOSIS — G8929 Other chronic pain: Secondary | ICD-10-CM | POA: Insufficient documentation

## 2023-10-30 NOTE — Therapy (Signed)
 OUTPATIENT PHYSICAL THERAPY LOWER EXTREMITY EVALUATION   Patient Name: Kim [name] MRN: [id number] DOB:1970/11/01, 53 y.o., female Today's Date: 10/30/2023  END OF SESSION:  PT End of Session - 10/30/23 0943     Visit Number 1    Number of Visits 7    Date for PT Re-Evaluation 12/11/23    PT Start Time 0830    PT Stop Time 0915    PT Time Calculation (min) 45 min    Activity Tolerance Patient tolerated treatment well    Behavior During Therapy [facility] for tasks assessed/performed          Past Medical History:  Diagnosis Date   Abnormal Pap smear    patient states she had cancer that was removed from her cervix   Asthma    when pregnant/ once 6 yrs ago only time asthma attack the dr said   Cancer [facility])    Clotting disorder (HCC)    Constipation    GERD (gastroesophageal reflux disease) 10/19/2015   Gestational diabetes    Hemorrhoids    Intramural leiomyoma of uterus 02/29/2016   Pulmonary embolism (HCC)    Renal insufficiency    Vitamin D  deficiency    Vitamin D  deficiency    Past Surgical History:  Procedure Laterality Date   CHOLECYSTECTOMY     Patient Active Problem List   Diagnosis Date Noted   Helicobacter pylori (H. pylori) infection 09/13/2023   Polyarthralgia 08/08/2023   Traveler's diarrhea 09/21/2022   Mouth pain 11/23/2021   TMJ (sprain of temporomandibular joint), initial encounter 12/29/2020   Anxiety disorder due to general medical condition with panic attack 01/31/2020   Cerebral aneurysm without rupture 01/31/2020   Intramural leiomyoma of uterus 02/29/2016   Need for immunization against influenza 01/28/2015   Hx of pulmonary embolus during pregnancy 11/09/2014   History of gestational diabetes    Screen for colon cancer     PCP: Kim Sharper, MD  REFERRING PROVIDER: Lovie Clarity, MD  REFERRING DIAG: Polyarthralgia [M25.50]   Rationale for Evaluation and Treatment: Rehabilitation  THERAPY DIAG:  Chronic pain of  right knee  Other low back pain  Muscle weakness (generalized)  PERTINENT HISTORY: No pertinent PMH on chart, reported having an aneurism  WEIGHT BEARING RESTRICTIONS: No  FALLS:  Has patient fallen in last 6 months? No  LIVING ENVIRONMENT: Lives with: lives with their family Lives in: House/apartment Stairs: Yes: External: 2 steps; occasional discomfort Has following equipment at home:   OCCUPATION: not currently working   PRECAUTIONS: None ---------------------------------------------------------------------------------------------  SUBJECTIVE:   SUBJECTIVE STATEMENT: Eval statement 10/30/2023: pt has mult joint pain in the L wrist,B hands,B shoulders, B knees , and low back, however most pain is in the R knee and low back. Occasionally wakes her up at night and hurts with bending. Occasionally swells up, limits her with standing, walking, bending, and zumba. Wears compression sleeves on R knee and B hands as well as a back brace. Has had negative rheumatology lab screening.  8/10 pain (feels like bone pain) No n/t symptoms  RED FLAGS: None   PLOF: Independent  PATIENT GOALS: reduce pain  NEXT MD VISIT: need to schedule ---------------------------------------------------------------------------------------------  OBJECTIVE:  Note: Objective measures were completed at Evaluation unless otherwise noted.  DIAGNOSTIC FINDINGS: no recent pertinent imaging  PATIENT SURVEYS:  PSFS: THE PATIENT SPECIFIC FUNCTIONAL SCALE  Place score of 0-10 (0 = unable to perform activity and 10 = able to perform activity at the same level as before  injury or problem)  Activity Date: 10/30/2023     Get on her knees 8    2.Zumba 8    3.Squat 5    4.      Total Score 21      Total Score = Sum of activity scores/number of activities  Minimally Detectable Change: 3 points (for single activity); 2 points (for average score)  [name] (nd). The Patient Specific  Functional Scale . Retrieved from [email or web address]   COGNITION: Overall cognitive status: Within functional limits for tasks assessed     SENSATION: WFL  EDEMA:  Minimal around R knee  MUSCLE LENGTH: Deferred d/t time constraints at eval  POSTURE: No Significant postural limitations  PALPATION: Tenderness to anterior R knee   LOWER EXTREMITY ROM:  Active ROM Right eval Left eval  Hip flexion WFL! WFL  Hip extension [facility]  Hip abduction    Hip adduction    Hip internal rotation    Hip external rotation    Knee flexion [facility]  Knee extension WFL! WFL  Ankle dorsiflexion    Ankle plantarflexion    Ankle inversion    Ankle eversion     (Blank rows = not tested)  ! Indicates pain with testing  LOWER EXTREMITY MMT:  MMT Right eval Left eval  Hip flexion 3+ 4  Hip extension 4 4  Hip abduction 4- 4-  Hip adduction    Hip internal rotation    Hip external rotation    Knee flexion 4 4  Knee extension 4- 4+  Ankle dorsiflexion    Ankle plantarflexion    Ankle inversion    Ankle eversion     (Blank rows = not tested)  ! Indicates pain with testing  LOWER EXTREMITY SPECIAL TESTS:  Deferred d/t time constraints on eval  FUNCTIONAL TESTS:  Squat: limited knee flexion ankle dorsiflexion, pain in R knee and lumbar spine indicating instability along kinetic chain  GAIT: Distance walked: 169ft Assistive device utilized: None Level of assistance: Complete Independence Comments: WFL                                                                                                                                [facility] Adult PT Treatment:                                                DATE: 10/30/2023 Self Care: Pt education, detailed below POC discussion    PATIENT EDUCATION:  Education details: Pt received education regarding HEP performance, ADL performance, functional activity tolerance, impairment  education, appropriate performance of therapeutic activities. Answering all questions. Person educated: Patient Education method: Explanation, Demonstration, Tactile cues, Verbal cues, and Handouts Education comprehension: verbalized understanding and returned demonstration  HOME EXERCISE PROGRAM: Not given at eval d/t time constraints. ---------------------------------------------------------------------------------------------  ASSESSMENT:  CLINICAL IMPRESSION: Eval impression (10/30/2023): Pt. attended today's physical therapy session for evaluation of polyarthralgia. Pt is highly vocal and has complaints of multi joint pain with R knee and low back being the most limiting. Pt has notable deficits with R knee strength,  R knee/lumbar stability and activity tolerance with functional motions such as squatting.  Pt would benefit from therapeutic focus on kinetic chain strengthening, stability, and gradual progression of activity. Treatment performed today focused on pt education detailed in the objective. Pt demonstrated good understanding of education provided. required moderate verbal cues to stay on task and no assistance for appropriate performance with today's activities. Pt requires the intervention of skilled outpatient physical therapy to address the aforementioned deficits and progress towards a functional level in line with therapeutic goals.   OBJECTIVE IMPAIRMENTS: decreased knowledge of condition, decreased knowledge of use of DME, decreased mobility, difficulty walking, decreased strength, improper body mechanics, and pain.   ACTIVITY LIMITATIONS: carrying, squatting, sleeping, stairs, and locomotion level  PARTICIPATION LIMITATIONS: cleaning, laundry, and community activity  PERSONAL FACTORS: Behavior pattern, Fitness, Social background, and Time since onset of injury/illness/exacerbation are also affecting patient's functional outcome.   REHAB POTENTIAL: good  CLINICAL  DECISION MAKING: Evolving/moderate complexity  EVALUATION COMPLEXITY: Moderate   GOALS: Goals reviewed with patient? YES  SHORT TERM GOALS: Target date: 11/20/2023 Pt will be independent with administered HEP to demonstrate the competency necessary for long term managemnet of symptoms at home.  Baseline: Goal status: INITIAL  LONG TERM GOALS: Target date: 12/11/2023  Pt. Will achieve a PSFS score of 24 as to demonstrate improvement in self-perceived functional ability with daily activities.  Baseline: 21 Goal status: INITIAL  2.  Pt will improve Global Hip/knee strength to a 4+/5 to demonstrate improvement in strength for quality of motion and activity performance. Baseline: see MMT chart Goal status: INITIAL  3.  Pt will report pain levels improving during ADLs to be less than or equal to 2/10 as to demonstrate improved tolerance with daily functional activities such as squatting. Baseline: 8/10 Goal status: INITIAL --------------------------------------------------------------------------------------------- PLAN:  PT FREQUENCY: 1-2x/week  PT DURATION: 6 weeks  PLANNED INTERVENTIONS: 97110-Therapeutic exercises, 97530- Therapeutic activity, 97112- Neuromuscular re-education, [phone]- Self Care, 02859- Manual therapy, [phone]- Gait training, [phone]- Aquatic Therapy, Patient/Family education, Balance training, Stair training, Joint mobilization, Spinal mobilization, and DME instructions  PLAN FOR NEXT SESSION: Review HEP, Begin POC as detailed in the assessment   Mabel Kiang, PT, DPT 10/30/2023, 9:44 AM   For all possible CPT codes, reference the Planned Interventions line above.     Check all conditions that are expected to impact treatment: {Conditions expected to impact treatment:None of these apply   If treatment provided at initial evaluation, no treatment charged due to lack of authorization.

## 2023-10-31 ENCOUNTER — Encounter: Admitting: Student

## 2023-11-01 NOTE — Progress Notes (Signed)
 53 y.o. [id number] female with fibroids, H/O PE here for annual exam. Married. Due to language barrier, an interpreter was present during the history-taking and subsequent discussion (and for part of the physical exam) with this patient.  Patient's last menstrual period was 11/23/2020.   She reports feeling very tired, discuss Vitamin D  levels. Reports having trouble falling asleep and joint pains for few months.  She was seen by primary care in April for workup, and she has follow-up with them on Monday.  Request referral/information from neurologist for hx of aneurysms. Will need to f/u with PCP for referral.  Has H. Pylori evaluation next week. Starting PT for knee next week.  Urine sample provided: Yes  Abnormal bleeding: none Pelvic discharge or pain: none Breast mass, nipple discharge or skin changes : none  Sexually active: Yes Birth control: None Last PAP: 10/30/2019 NIL, HPV neg Last mammogram: 10/27/22 BI-Rads 1, density b Last colonoscopy: 09/06/23 10 year recall  Exercising: Yes, Zumba 5 days a week Smoker: no  [employer] Visit from [facility]02/2023 in [facility]  PHQ-2 Total Score 0    Flowsheet Row Office Visit from 02/26/2020 in [facility]  PHQ-9 Total Score 14     GYN HISTORY: No sig hx  OB History  Gravida Para Term Preterm AB Living  5 5 5   5   SAB IAB Ectopic Multiple Live Births     0 5    # Outcome Date GA Lbr Len/2nd Weight Sex Type Anes PTL Lv  5 Term 11/04/14 [redacted]w[redacted]d  7 lb 8.5 oz (3.416 kg) F Vag-Spont EPI  LIV  4 Term 03/18/08 [redacted]w[redacted]d    Vag-Spont  N LIV  3 Term 11/07/00 [redacted]w[redacted]d    Vag-Spont  N LIV  2 Term 07/09/90 [redacted]w[redacted]d    Vag-Spont  N LIV  1 Term 08/24/89 [redacted]w[redacted]d    Vag-Spont      Past Medical History:  Diagnosis Date   Abnormal Pap smear    patient states she had cancer that was removed from her cervix   Asthma    when pregnant/ once 6 yrs ago only  time asthma attack the dr said   Cancer [facility])    Clotting disorder (HCC)    Constipation    GERD (gastroesophageal reflux disease) 10/19/2015   Gestational diabetes    H. pylori infection    Hemorrhoids    Intramural leiomyoma of uterus 02/29/2016   Pulmonary embolism (HCC)    Renal insufficiency    Vitamin D  deficiency    Vitamin D  deficiency    Past Surgical History:  Procedure Laterality Date   CHOLECYSTECTOMY     Current Outpatient Medications on File Prior to Visit  Medication Sig Dispense Refill   aluminum -magnesium  hydroxide-simethicone  (MAALOX) 200-200-20 MG/5ML SUSP Take 15 mLs by mouth 4 (four) times daily -  before meals and at bedtime. 355 mL 0   amitriptyline  (ELAVIL ) 10 MG tablet TAKE 1 TABLET BY MOUTH EVERYDAY AT BEDTIME 90 tablet 1   celecoxib  (CELEBREX ) 200 MG capsule TAKE 1 CAPSULE BY MOUTH TWICE A DAY 30 capsule 0   diclofenac  Sodium (VOLTAREN  ARTHRITIS PAIN) 1 % GEL Apply 4 g topically 4 (four) times daily as needed (for pain). 350 g 1   dicyclomine  (BENTYL ) 20 MG tablet TAKE 1 TABLET (20 MG TOTAL) BY MOUTH 3 (THREE) TIMES DAILY AS NEEDED FOR SPASMS. 50 tablet 0  esomeprazole  (NEXIUM ) 20 MG capsule Take 1 capsule (20 mg total) by mouth daily. 30 capsule 0   famotidine  (PEPCID ) 40 MG tablet TAKE 1 TABLET BY MOUTH EVERYDAY AT BEDTIME 90 tablet 3   Bismuth /Metronidaz/Tetracyclin (PYLERA) 140-125-125 MG CAPS Take 3 capsules by mouth 4 (four) times daily -  before meals and at bedtime for 10 days. (Patient not taking: Reported on 11/02/2023) 120 capsule 0   omeprazole  (PRILOSEC) 20 MG capsule Take 1 capsule (20 mg total) by mouth 2 (two) times daily for 10 days. (Patient not taking: Reported on 11/02/2023) 20 capsule 0   No current facility-administered medications on file prior to visit.   Social History   Socioeconomic History   Marital status: Married    Spouse name: Not on file   Number of children: 5   Years of education: Not on file   Highest education  level: Not on file  Occupational History   Occupation: Stay at home mom  Tobacco Use   Smoking status: Never   Smokeless tobacco: Never  Vaping Use   Vaping status: Never Used  Substance and Sexual Activity   Alcohol use: Yes    Alcohol/week: 0.0 standard drinks of alcohol    Comment: Occasionally.   Drug use: No   Sexual activity: Yes    Partners: Male    Birth control/protection: None  Other Topics Concern   Not on file  Social History Narrative   Married with five children. Came to [place] from [place] in 2001. Unemployed currently. Spanish speaking.   Social Drivers of Corporate investment banker Strain: Not on file  Food Insecurity: Not on file  Transportation Needs: Not on file  Physical Activity: Not on file  Stress: Not on file  Social Connections: Unknown (08/27/2021)   Received from [facility]   Social Network    Social Network: Not on file  Intimate Partner Violence: Unknown (07/19/2021)   Received from [facility]   HITS    Physically Hurt: Not on file    Insult or Talk Down To: Not on file    Threaten Physical Harm: Not on file    Scream or Curse: Not on file   Family History  Problem Relation Age of Onset   Diabetes Mother    Hypertension Mother    Hyperlipidemia Father    Diabetes Father    Hypertension Father    Heart disease Sister    Diabetes Brother    Hyperlipidemia Brother    Drug abuse Paternal Grandmother    Kidney disease Other    Birth defects Other        anal atresia/stenosis   Uterine cancer Neg Hx    Ulcerative colitis Neg Hx    Esophageal cancer Neg Hx    Colon cancer Neg Hx    Allergies  Allergen Reactions   Latex Itching and Rash    Itching with use of condoms     PE Today's Vitals   11/02/23 0802  BP: 110/78  Pulse: 71  Temp: 97.9 F (36.6 C)  TempSrc: Oral  SpO2: 98%  Weight: 188 lb (85.3 kg)  Height: 5' 5.5 (1.664 m)   Body mass index is 30.81 kg/m.  Physical Exam Vitals reviewed. Exam conducted  with a chaperone present.  Constitutional:      General: She is not in acute distress.    Appearance: Normal appearance.  HENT:     Head: Normocephalic and atraumatic.     Nose: Nose normal.  Eyes:     Extraocular Movements: Extraocular movements intact.     Conjunctiva/sclera: Conjunctivae normal.  Neck:     Thyroid : No thyroid  mass, thyromegaly or thyroid  tenderness.  Pulmonary:     Effort: Pulmonary effort is normal.  Chest:     Chest wall: No mass or tenderness.  Breasts:    Right: Normal. No swelling, mass, nipple discharge, skin change or tenderness.     Left: Normal. No swelling, mass, nipple discharge, skin change or tenderness.  Abdominal:     General: There is no distension.     Palpations: Abdomen is soft.     Tenderness: There is no abdominal tenderness.  Genitourinary:    General: Normal vulva.     Exam position: Lithotomy position.     Urethra: No prolapse.     Vagina: Normal. No vaginal discharge or bleeding.     Cervix: Normal. No lesion.     Uterus: Normal. Not enlarged and not tender.      Adnexa: Right adnexa normal and left adnexa normal.      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     Cervical back: Normal range of motion.  Lymphadenopathy:     Upper Body:     Right upper body: No axillary adenopathy.     Left upper body: No axillary adenopathy.     Lower Body: No right inguinal adenopathy. No left inguinal adenopathy.  Skin:    General: Skin is warm and dry.  Neurological:     General: No focal deficit present.     Mental Status: She is alert.  Psychiatric:        Mood and Affect: Mood normal.        Behavior: Behavior normal.      Assessment and Plan:        Well woman exam with routine gynecological exam Assessment & Plan: Cervical cancer screening performed according to ASCCP guidelines. Encouraged annual mammogram screening Colonoscopy UTD Labs and  immunizations with her primary Encouraged safe sexual practices as indicated Encouraged healthy lifestyle practices with diet and exercise For patients under 50-70yo, I recommend 1200mg  calcium daily and 600IU of vitamin D  daily.    Screening for depression  Vaginal irritation -     WET PREP FOR TRICH, YEAST, CLUE  Vitamin D  insufficiency -     VITAMIN D  25 Hydroxy (Vit-D Deficiency, Fractures)  Lichen sclerosus et atrophicus of the vulva -     Clobetasol  Propionate; Apply 1 Application topically daily.  Dispense: 60 g; Refill: 3  Recommend daily clobetasol  for 3 months, followed by reassessment of the genitourinary area. Return office in 3 months.    Vera LULLA Pa, MD

## 2023-11-02 ENCOUNTER — Encounter: Payer: Self-pay | Admitting: Obstetrics and Gynecology

## 2023-11-02 ENCOUNTER — Ambulatory Visit (INDEPENDENT_AMBULATORY_CARE_PROVIDER_SITE_OTHER): Payer: Medicaid Other | Admitting: Obstetrics and Gynecology

## 2023-11-02 VITALS — BP 110/78 | HR 71 | Temp 97.9°F | Ht 65.5 in | Wt 188.0 lb

## 2023-11-02 DIAGNOSIS — Z1331 Encounter for screening for depression: Secondary | ICD-10-CM | POA: Diagnosis not present

## 2023-11-02 DIAGNOSIS — N898 Other specified noninflammatory disorders of vagina: Secondary | ICD-10-CM

## 2023-11-02 DIAGNOSIS — Z01419 Encounter for gynecological examination (general) (routine) without abnormal findings: Secondary | ICD-10-CM | POA: Insufficient documentation

## 2023-11-02 DIAGNOSIS — N904 Leukoplakia of vulva: Secondary | ICD-10-CM

## 2023-11-02 DIAGNOSIS — E559 Vitamin D deficiency, unspecified: Secondary | ICD-10-CM | POA: Diagnosis not present

## 2023-11-02 LAB — VITAMIN D 25 HYDROXY (VIT D DEFICIENCY, FRACTURES): Vit D, 25-Hydroxy: 53 ng/mL (ref 30–100)

## 2023-11-02 LAB — WET PREP FOR TRICH, YEAST, CLUE

## 2023-11-02 MED ORDER — CLOBETASOL PROPIONATE 0.05 % EX OINT: 1.0000 | TOPICAL_OINTMENT | Freq: Every day | CUTANEOUS | 3 refills | Status: AC

## 2023-11-02 NOTE — Patient Instructions (Addendum)
 Neurosurgery Hillman Lin SAUNDERS, MD  [address] [facility]  [address], [place] [address]  Phone: [phone]  Fax: [phone]    For patients under 50-53yo, I recommend 1200mg  calcium daily and 600IU of vitamin D  daily. For patients over 53yo, I recommend 1200mg  calcium daily and 800IU of vitamin D  daily.  Health Maintenance, Female Adopting a healthy lifestyle and getting preventive care are important in promoting health and wellness. Ask your health care provider about: The right schedule for you to have regular tests and exams. Things you can do on your own to prevent diseases and keep yourself healthy. What should I know about diet, weight, and exercise? Eat a healthy diet  Eat a diet that includes plenty of vegetables, fruits, low-fat dairy products, and lean protein. Do not eat a lot of foods that are high in solid fats, added sugars, or sodium. Maintain a healthy weight Body mass index (BMI) is used to identify weight problems. It estimates body fat based on height and weight. Your health care provider can help determine your BMI and help you achieve or maintain a healthy weight. Get regular exercise Get regular exercise. This is one of the most important things you can do for your health. Most adults should: Exercise for at least 150 minutes each week. The exercise should increase your heart rate and make you sweat (moderate-intensity exercise). Do strengthening exercises at least twice a week. This is in addition to the moderate-intensity exercise. Spend less time sitting. Even light physical activity can be beneficial. Watch cholesterol and blood lipids Have your blood tested for lipids and cholesterol at 53 years of age, then have this test every 5 years. Have your cholesterol levels checked more often if: Your lipid or cholesterol levels are high. You are older than 53 years of age. You are at high risk for heart disease. What should I know about  cancer screening? Depending on your health history and family history, you may need to have cancer screening at various ages. This may include screening for: Breast cancer. Cervical cancer. Colorectal cancer. Skin cancer. Lung cancer. What should I know about heart disease, diabetes, and high blood pressure? Blood pressure and heart disease High blood pressure causes heart disease and increases the risk of stroke. This is more likely to develop in people who have high blood pressure readings or are overweight. Have your blood pressure checked: Every 3-5 years if you are 36-12 years of age. Every year if you are 21 years old or older. Diabetes Have regular diabetes screenings. This checks your fasting blood sugar level. Have the screening done: Once every three years after age 31 if you are at a normal weight and have a low risk for diabetes. More often and at a younger age if you are overweight or have a high risk for diabetes. What should I know about preventing infection? Hepatitis B If you have a higher risk for hepatitis B, you should be screened for this virus. Talk with your health care provider to find out if you are at risk for hepatitis B infection. Hepatitis C Testing is recommended for: Everyone born from [birth year] through 1965. Anyone with known risk factors for hepatitis C. Sexually transmitted infections (STIs) Get screened for STIs, including gonorrhea and chlamydia, if: You are sexually active and are younger than 53 years of age. You are older than 53 years of age and your health care provider tells you that you are at risk for this type of infection.  Your sexual activity has changed since you were last screened, and you are at increased risk for chlamydia or gonorrhea. Ask your health care provider if you are at risk. Ask your health care provider about whether you are at high risk for HIV. Your health care provider may recommend a prescription medicine to help prevent HIV  infection. If you choose to take medicine to prevent HIV, you should first get tested for HIV. You should then be tested every 3 months for as long as you are taking the medicine. Osteoporosis and menopause Osteoporosis is a disease in which the bones lose minerals and strength with aging. This can result in bone fractures. If you are 56 years old or older, or if you are at risk for osteoporosis and fractures, ask your health care provider if you should: Be screened for bone loss. Take a calcium or vitamin D  supplement to lower your risk of fractures. Be given hormone replacement therapy (HRT) to treat symptoms of menopause. Follow these instructions at home: Alcohol use Do not drink alcohol if: Your health care provider tells you not to drink. You are pregnant, may be pregnant, or are planning to become pregnant. If you drink alcohol: Limit how much you have to: 0-1 drink a day. Know how much alcohol is in your drink. In the [place] one drink equals one 12 oz bottle of beer (355 mL), one 5 oz glass of wine (148 mL), or one 1 oz glass of hard liquor (44 mL). Lifestyle Do not use any products that contain nicotine or tobacco. These products include cigarettes, chewing tobacco, and vaping devices, such as e-cigarettes. If you need help quitting, ask your health care provider. Do not use street drugs. Do not share needles. Ask your health care provider for help if you need support or information about quitting drugs. General instructions Schedule regular health, dental, and eye exams. Stay current with your vaccines. Tell your health care provider if: You often feel depressed. You have ever been abused or do not feel safe at home. Summary Adopting a healthy lifestyle and getting preventive care are important in promoting health and wellness. Follow your health care provider's instructions about healthy diet, exercising, and getting tested or screened for diseases. Follow your health care  provider's instructions on monitoring your cholesterol and blood pressure. This information is not intended to replace advice given to you by your health care provider. Make sure you discuss any questions you have with your health care provider. Document Revised: 08/24/2020 Document Reviewed: 08/24/2020 Elsevier Patient Education  2024 [employer].

## 2023-11-02 NOTE — Assessment & Plan Note (Signed)
 Cervical cancer screening performed according to ASCCP guidelines. Encouraged annual mammogram screening Colonoscopy UTD Labs and immunizations with her primary Encouraged safe sexual practices as indicated Encouraged healthy lifestyle practices with diet and exercise For patients under 50-53yo, I recommend 1200mg  calcium daily and 600IU of vitamin D daily.

## 2023-11-03 ENCOUNTER — Ambulatory Visit: Payer: Self-pay | Admitting: Obstetrics and Gynecology

## 2023-11-03 NOTE — Therapy (Signed)
 OUTPATIENT PHYSICAL THERAPY TREATMENT NOTE   Patient Name: Kim [name] Elder MRN: [id number] DOB:09-18-1970, 53 y.o., female Today's Date: 11/06/2023  END OF SESSION:  PT End of Session - 11/06/23 0916     Visit Number 2    Number of Visits 7    Date for PT Re-Evaluation 12/11/23    Authorization Type MCD    Authorization Time Period Approved 10 visits 10/30/23-12/29/23    Authorization - Visit Number 2    Authorization - Number of Visits 10    PT Start Time 0915    PT Stop Time 0955    PT Time Calculation (min) 40 min    Activity Tolerance Patient tolerated treatment well    Behavior During Therapy [facility] for tasks assessed/performed           Past Medical History:  Diagnosis Date   Abnormal Pap smear    patient states she had cancer that was removed from her cervix   Asthma    when pregnant/ once 6 yrs ago only time asthma attack the dr said   Cancer [facility])    Clotting disorder (HCC)    Constipation    GERD (gastroesophageal reflux disease) 10/19/2015   Gestational diabetes    H. pylori infection    Hemorrhoids    Intramural leiomyoma of uterus 02/29/2016   Pulmonary embolism (HCC)    Renal insufficiency    Vitamin D  deficiency    Vitamin D  deficiency    Past Surgical History:  Procedure Laterality Date   CHOLECYSTECTOMY     Patient Active Problem List   Diagnosis Date Noted   Well woman exam with routine gynecological exam 11/02/2023   Helicobacter pylori (H. pylori) infection 09/13/2023   Polyarthralgia 08/08/2023   Traveler's diarrhea 09/21/2022   Mouth pain 11/23/2021   TMJ (sprain of temporomandibular joint), initial encounter 12/29/2020   Anxiety disorder due to general medical condition with panic attack 01/31/2020   Cerebral aneurysm without rupture 01/31/2020   Intramural leiomyoma of uterus 02/29/2016   Need for immunization against influenza 01/28/2015   Hx of pulmonary embolus during pregnancy 11/09/2014   History of gestational diabetes     Screen for colon cancer     PCP: [name], MD  REFERRING PROVIDER: Lovie Clarity, MD  REFERRING DIAG: Polyarthralgia [M25.50]   Rationale for Evaluation and Treatment: Rehabilitation  THERAPY DIAG:  Chronic pain of right knee  Other low back pain  Muscle weakness (generalized)  PERTINENT HISTORY: No pertinent PMH on chart, reported having an aneurism  WEIGHT BEARING RESTRICTIONS: No  FALLS:  Has patient fallen in last 6 months? No  LIVING ENVIRONMENT: Lives with: lives with their family Lives in: House/apartment Stairs: Yes: External: 2 steps; occasional discomfort Has following equipment at home:   OCCUPATION: not currently working   PRECAUTIONS: None ---------------------------------------------------------------------------------------------  SUBJECTIVE:   SUBJECTIVE STATEMENT: Arrives to PT with c/o R sided knee/ankle pain.  7/10 in intensity.  8/10 pain (feels like bone pain) No n/t symptoms  RED FLAGS: None   PLOF: Independent  PATIENT GOALS: reduce pain  NEXT MD VISIT: need to schedule ---------------------------------------------------------------------------------------------  OBJECTIVE:  Note: Objective measures were completed at Evaluation unless otherwise noted.  DIAGNOSTIC FINDINGS: no recent pertinent imaging  PATIENT SURVEYS:  PSFS: THE PATIENT SPECIFIC FUNCTIONAL SCALE  Place score of 0-10 (0 = unable to perform activity and 10 = able to perform activity at the same level as before injury or problem)  Activity Date: 10/30/2023  Get on her knees 8    2.Zumba 8    3.Squat 5    4.      Total Score 21      Total Score = Sum of activity scores/number of activities  Minimally Detectable Change: 3 points (for single activity); 2 points (for average score)  [facility] (nd). The Patient Specific Functional Scale . Retrieved from [email or web address]    COGNITION: Overall cognitive status: Within functional limits for tasks assessed     SENSATION: WFL  EDEMA:  Minimal around R knee  MUSCLE LENGTH: Deferred d/t time constraints at eval  POSTURE: No Significant postural limitations  PALPATION: Tenderness to anterior R knee   LOWER EXTREMITY ROM:  Active ROM Right eval Left eval  Hip flexion WFL! WFL  Hip extension [facility]  Hip abduction    Hip adduction    Hip internal rotation    Hip external rotation    Knee flexion [facility]  Knee extension WFL! WFL  Ankle dorsiflexion    Ankle plantarflexion    Ankle inversion    Ankle eversion     (Blank rows = not tested)  ! Indicates pain with testing  LOWER EXTREMITY MMT:  MMT Right eval Left eval  Hip flexion 3+ 4  Hip extension 4 4  Hip abduction 4- 4-  Hip adduction    Hip internal rotation    Hip external rotation    Knee flexion 4 4  Knee extension 4- 4+  Ankle dorsiflexion    Ankle plantarflexion    Ankle inversion    Ankle eversion     (Blank rows = not tested)  ! Indicates pain with testing  LOWER EXTREMITY SPECIAL TESTS:  Deferred d/t time constraints on eval  FUNCTIONAL TESTS:  Squat: limited knee flexion ankle dorsiflexion, pain in R knee and lumbar spine indicating instability along kinetic chain  GAIT: Distance walked: 168ft Assistive device utilized: None Level of assistance: Complete Independence Comments: WFL   [facility] Adult PT Treatment:                                                DATE: 11/06/23 Therapeutic Exercise: Nustep L2 8 min Neuromuscular re-ed: Bridge 15x P-ball curl ups 15x B, 15/15 unilaterally Standing heel/toe  Therapeutic Activity: Seated hamstring stretch 30s x2 Supine QL stretch 30s x2                                                                                                                           [facility] Adult PT Treatment:  DATE: 10/30/2023 Self Care: Pt  education, detailed below POC discussion    PATIENT EDUCATION:  Education details: Pt received education regarding HEP performance, ADL performance, functional activity tolerance, impairment education, appropriate performance of therapeutic activities. Answering all questions. Person educated: Patient Education method: Explanation, Demonstration, Tactile cues, Verbal cues, and Handouts Education comprehension: verbalized understanding and returned demonstration  HOME EXERCISE PROGRAM: Access Code: [email or web address] URL: [email or web address] Date: 11/06/2023 Prepared by: Mahrukh Seguin  Exercises - Seated Table Hamstring Stretch  - 1 x daily - 5 x weekly - 1 sets - 2 reps - 30s hold - Supine Quadratus Lumborum Stretch  - 1 x daily - 5 x weekly - 1 sets - 2 reps - 30s hold - Heel Toe Raises with Counter Support  - 1 x daily - 5 x weekly - 1 sets - 15 reps - Sit to Stand Without Arm Support  - 1 x daily - 5 x weekly - 5 reps ---------------------------------------------------------------------------------------------  ASSESSMENT:  CLINICAL IMPRESSION: First f/u session.  Focus of today was HEP instruction and written handouts provided.  Added hamstring and QL stretch, introduced bridging and p-ball curl ups as well as STS and heel toe for LE strengthening.  Able to complete all requested tasks w/o elevated symptoms.  Eval impression (10/30/2023): Pt. attended today's physical therapy session for evaluation of polyarthralgia. Pt is highly vocal and has complaints of multi joint pain with R knee and low back being the most limiting. Pt has notable deficits with R knee strength,  R knee/lumbar stability and activity tolerance with functional motions such as squatting.  Pt would benefit from therapeutic focus on kinetic chain strengthening, stability, and gradual progression of activity. Treatment performed today focused on pt education detailed in the objective. Pt demonstrated good  understanding of education provided. required moderate verbal cues to stay on task and no assistance for appropriate performance with today's activities. Pt requires the intervention of skilled outpatient physical therapy to address the aforementioned deficits and progress towards a functional level in line with therapeutic goals.   OBJECTIVE IMPAIRMENTS: decreased knowledge of condition, decreased knowledge of use of DME, decreased mobility, difficulty walking, decreased strength, improper body mechanics, and pain.   ACTIVITY LIMITATIONS: carrying, squatting, sleeping, stairs, and locomotion level  PARTICIPATION LIMITATIONS: cleaning, laundry, and community activity  PERSONAL FACTORS: Behavior pattern, Fitness, Social background, and Time since onset of injury/illness/exacerbation are also affecting patient's functional outcome.   REHAB POTENTIAL: good  CLINICAL DECISION MAKING: Evolving/moderate complexity  EVALUATION COMPLEXITY: Moderate   GOALS: Goals reviewed with patient? YES  SHORT TERM GOALS: Target date: 11/20/2023 Pt will be independent with administered HEP to demonstrate the competency necessary for long term managemnet of symptoms at home.  Baseline: Goal status: INITIAL  LONG TERM GOALS: Target date: 12/11/2023  Pt. Will achieve a PSFS score of 24 as to demonstrate improvement in self-perceived functional ability with daily activities.  Baseline: 21 Goal status: INITIAL  2.  Pt will improve Global Hip/knee strength to a 4+/5 to demonstrate improvement in strength for quality of motion and activity performance. Baseline: see MMT chart Goal status: INITIAL  3.  Pt will report pain levels improving during ADLs to be less than or equal to 2/10 as to demonstrate improved tolerance with daily functional activities such as squatting. Baseline: 8/10 Goal status:  INITIAL --------------------------------------------------------------------------------------------- PLAN:  PT FREQUENCY: 1-2x/week  PT DURATION: 6 weeks  PLANNED INTERVENTIONS: 97110-Therapeutic exercises, 97530- Therapeutic activity, [id number]- Neuromuscular re-education, 97535- Self Care, 02859- Manual therapy, [id number]- Gait  training, 02886- Aquatic Therapy, Patient/Family education, Balance training, Stair training, Joint mobilization, Spinal mobilization, and DME instructions  PLAN FOR NEXT SESSION: Review HEP, Begin POC as detailed in the assessment   Mabel Kiang, PT, DPT 11/06/2023, 9:58 AM   For all possible CPT codes, reference the Planned Interventions line above.     Check all conditions that are expected to impact treatment: {Conditions expected to impact treatment:None of these apply   If treatment provided at initial evaluation, no treatment charged due to lack of authorization.

## 2023-11-06 ENCOUNTER — Ambulatory Visit

## 2023-11-06 ENCOUNTER — Other Ambulatory Visit: Payer: Self-pay

## 2023-11-06 ENCOUNTER — Ambulatory Visit: Admitting: Student

## 2023-11-06 VITALS — HR 58 | Temp 98.1°F | Ht 65.0 in | Wt 191.2 lb

## 2023-11-06 DIAGNOSIS — M2550 Pain in unspecified joint: Secondary | ICD-10-CM | POA: Diagnosis not present

## 2023-11-06 DIAGNOSIS — G8929 Other chronic pain: Secondary | ICD-10-CM

## 2023-11-06 DIAGNOSIS — M5459 Other low back pain: Secondary | ICD-10-CM

## 2023-11-06 DIAGNOSIS — M6281 Muscle weakness (generalized): Secondary | ICD-10-CM | POA: Diagnosis not present

## 2023-11-06 DIAGNOSIS — M25561 Pain in right knee: Secondary | ICD-10-CM | POA: Diagnosis not present

## 2023-11-06 DIAGNOSIS — M25571 Pain in right ankle and joints of right foot: Secondary | ICD-10-CM | POA: Diagnosis not present

## 2023-11-06 DIAGNOSIS — A048 Other specified bacterial intestinal infections: Secondary | ICD-10-CM

## 2023-11-06 NOTE — Assessment & Plan Note (Signed)
 The patient presented to the office today for an H. pylori breath test to confirm eradication following completion of Pylera. However, the test could not be performed as the patient had eaten shortly before arriving. We discussed the option of waiting one hour versus returning the following day to complete the test. The patient elected to return tomorrow to have the test done. Using an interpreter, we reviewed the importance of fasting for at least one hour prior to the test. The patient verbalized understanding and committed to fasting before returning. As no medical interventions were performed during today's visit, no charge will be submitted for this encounter. - Return tomorrow for H. pylori test

## 2023-11-06 NOTE — Progress Notes (Signed)
 CC: H. pylori eradication test  HPI:  [name] is a 53 y.o. female living with a history stated below and presents today for H. pylori medication test. Please see problem based assessment and plan for additional details.  Past Medical History:  Diagnosis Date   Abnormal Pap smear    patient states she had cancer that was removed from her cervix   Asthma    when pregnant/ once 6 yrs ago only time asthma attack the dr said   Cancer [facility])    Clotting disorder (HCC)    Constipation    GERD (gastroesophageal reflux disease) 10/19/2015   Gestational diabetes    H. pylori infection    Hemorrhoids    Intramural leiomyoma of uterus 02/29/2016   Pulmonary embolism (HCC)    Renal insufficiency    Vitamin D  deficiency    Vitamin D  deficiency     Current Outpatient Medications on File Prior to Visit  Medication Sig Dispense Refill   aluminum -magnesium  hydroxide-simethicone  (MAALOX) 200-200-20 MG/5ML SUSP Take 15 mLs by mouth 4 (four) times daily -  before meals and at bedtime. 355 mL 0   amitriptyline  (ELAVIL ) 10 MG tablet TAKE 1 TABLET BY MOUTH EVERYDAY AT BEDTIME 90 tablet 1   Bismuth /Metronidaz/Tetracyclin (PYLERA) 140-125-125 MG CAPS Take 3 capsules by mouth 4 (four) times daily -  before meals and at bedtime for 10 days. (Patient not taking: Reported on 11/02/2023) 120 capsule 0   celecoxib  (CELEBREX ) 200 MG capsule TAKE 1 CAPSULE BY MOUTH TWICE A DAY 30 capsule 0   clobetasol  ointment (TEMOVATE ) 0.05 % Apply 1 Application topically daily. 60 g 3   diclofenac  Sodium (VOLTAREN  ARTHRITIS PAIN) 1 % GEL Apply 4 g topically 4 (four) times daily as needed (for pain). 350 g 1   dicyclomine  (BENTYL ) 20 MG tablet TAKE 1 TABLET (20 MG TOTAL) BY MOUTH 3 (THREE) TIMES DAILY AS NEEDED FOR SPASMS. 50 tablet 0   esomeprazole  (NEXIUM ) 20 MG capsule Take 1 capsule (20 mg total) by mouth daily. 30 capsule 0   famotidine  (PEPCID ) 40 MG tablet TAKE 1 TABLET BY MOUTH EVERYDAY AT BEDTIME 90  tablet 3   omeprazole  (PRILOSEC) 20 MG capsule Take 1 capsule (20 mg total) by mouth 2 (two) times daily for 10 days. (Patient not taking: Reported on 11/02/2023) 20 capsule 0   No current facility-administered medications on file prior to visit.    Family History  Problem Relation Age of Onset   Diabetes Mother    Hypertension Mother    Hyperlipidemia Father    Diabetes Father    Hypertension Father    Heart disease Sister    Diabetes Brother    Hyperlipidemia Brother    Drug abuse Paternal Grandmother    Kidney disease Other    Birth defects Other        anal atresia/stenosis   Uterine cancer Neg Hx    Ulcerative colitis Neg Hx    Esophageal cancer Neg Hx    Colon cancer Neg Hx     Social History   Socioeconomic History   Marital status: Married    Spouse name: Not on file   Number of children: 5   Years of education: Not on file   Highest education level: Not on file  Occupational History   Occupation: Stay at home mom  Tobacco Use   Smoking status: Never   Smokeless tobacco: Never  Vaping Use   Vaping status: Never Used  Substance and  Sexual Activity   Alcohol use: Yes    Alcohol/week: 0.0 standard drinks of alcohol    Comment: Occasionally.   Drug use: No   Sexual activity: Yes    Partners: Male    Birth control/protection: None  Other Topics Concern   Not on file  Social History Narrative   Married with five children. Came to [place] from [place] in 2001. Unemployed currently. Spanish speaking.   Social Drivers of Corporate investment banker Strain: Low Risk  (11/06/2023)   Overall Financial Resource Strain (CARDIA)    Difficulty of Paying Living Expenses: Not very hard  Food Insecurity: No Food Insecurity (11/06/2023)   Hunger Vital Sign    Worried About Running Out of Food in the Last Year: Never true    Ran Out of Food in the Last Year: Never true  Transportation Needs: No Transportation Needs (11/06/2023)   PRAPARE - Scientist, research (physical sciences) (Medical): No    Lack of Transportation (Non-Medical): No  Physical Activity: Not on file  Stress: No Stress Concern Present (11/06/2023)   [employer] of Occupational Health - Occupational Stress Questionnaire    Feeling of Stress: Only a little  Social Connections: Moderately Integrated (11/06/2023)   Social Connection and Isolation Panel    Frequency of Communication with Friends and Family: More than three times a week    Frequency of Social Gatherings with Friends and Family: Twice a week    Attends Religious Services: More than 4 times per year    Active Member of [employer] or Organizations: No    Attends Banker Meetings: Never    Marital Status: Married  Catering manager Violence: Not At Risk (11/06/2023)   Humiliation, Afraid, Rape, and Kick questionnaire    Fear of Current or Ex-Partner: No    Emotionally Abused: No    Physically Abused: No    Sexually Abused: No    Review of Systems: ROS negative except for what is noted on the assessment and plan.  Vitals:   11/06/23 1346  Pulse: (!) 58  Temp: 98.1 F (36.7 C)  TempSrc: Oral  SpO2: 98%  Weight: 191 lb 3.2 oz (86.7 kg)  Height: 5' 5 (1.651 m)    Physical Exam: Constitutional: well-appearing woman, sitting in chair , in no acute distress HENT: normocephalic atraumatic, mucous membranes moist Abdominal: soft, non-tender, non-distended MSK: normal bulk and tone Skin: warm and dry Psych: normal mood and behavior  Assessment & Plan:   Helicobacter pylori (H. pylori) infection The patient presented to the office today for an H. pylori breath test to confirm eradication following completion of Pylera. However, the test could not be performed as the patient had eaten shortly before arriving. We discussed the option of waiting one hour versus returning the following day to complete the test. The patient elected to return tomorrow to have the test done. Using an interpreter, we reviewed the  importance of fasting for at least one hour prior to the test. The patient verbalized understanding and committed to fasting before returning. As no medical interventions were performed during today's visit, no charge will be submitted for this encounter. - Return tomorrow for H. pylori test     Patient discussed with Dr. Karna Drue Grow, M.D [facility] Phone: [phone] Date 11/06/2023 Time 4:48 PM

## 2023-11-06 NOTE — Patient Instructions (Addendum)
 Gracias, [facility], por permitirnos brindarle su atencin hoy.Discutimos su prueba de erradicacin de H. pylori durante la visita de hoy. Desafortunadamente, no se pudo realizar la prueba como estaba planeada porque haba comido y no pudo esperar el tiempo requerido para la prueba. La prueba ha sido reprogramada para [place], y puede venir en cualquier momento entre las 8:30 AM y las 12:00 PM o de 2:00 a 3:00 PM.Por favor, recuerde Personal assistant en ayuno durante al menos una hora antes de la prueba.   I have ordered the following labs for you:  Lab Orders         H. pylori breath test       Tests ordered today:    Referrals ordered today:   Referral Orders  No referral(s) requested today     I have ordered the following medication/changed the following medications:   Stop the following medications: There are no discontinued medications.   Start the following medications: No orders of the defined types were placed in this encounter.    Follow up: tomorrow    Remember:   Should you have any questions or concerns please call the internal medicine clinic at [phone].   Drue Lisa Grow MD 11/06/2023, 4:45 PM   [facility]

## 2023-11-07 ENCOUNTER — Ambulatory Visit

## 2023-11-07 DIAGNOSIS — A048 Other specified bacterial intestinal infections: Secondary | ICD-10-CM

## 2023-11-07 NOTE — Progress Notes (Signed)
 Lab only visit

## 2023-11-08 ENCOUNTER — Telehealth: Payer: Self-pay

## 2023-11-08 LAB — H. PYLORI BREATH TEST: H pylori Breath Test: NEGATIVE

## 2023-11-08 NOTE — Telephone Encounter (Signed)
-----   Message from Nurse Burbank B sent at 09/13/2023  9:26 AM EDT ----- Regarding: H. Pylori stool antigen H. Pylori stool antigen - order in epic

## 2023-11-08 NOTE — Telephone Encounter (Signed)
 Patient completed H. Pylori breath test at PCPs office yesterday. Will await results.

## 2023-11-10 NOTE — Telephone Encounter (Signed)
 H. Pylori breath test negative. Results are in epic.

## 2023-11-10 NOTE — Progress Notes (Signed)
 Internal Medicine Clinic Attending  Case discussed with the resident at the time of the visit.  We reviewed the resident's history and exam and pertinent patient test results.  I agree with the assessment, diagnosis, and plan of care documented in the resident's note.

## 2023-11-13 ENCOUNTER — Encounter: Payer: Self-pay | Admitting: Physical Therapy

## 2023-11-13 ENCOUNTER — Ambulatory Visit: Admitting: Physical Therapy

## 2023-11-13 DIAGNOSIS — M6281 Muscle weakness (generalized): Secondary | ICD-10-CM

## 2023-11-13 DIAGNOSIS — M25571 Pain in right ankle and joints of right foot: Secondary | ICD-10-CM

## 2023-11-13 DIAGNOSIS — M5459 Other low back pain: Secondary | ICD-10-CM

## 2023-11-13 DIAGNOSIS — G8929 Other chronic pain: Secondary | ICD-10-CM

## 2023-11-13 DIAGNOSIS — M2550 Pain in unspecified joint: Secondary | ICD-10-CM | POA: Diagnosis not present

## 2023-11-13 DIAGNOSIS — M25561 Pain in right knee: Secondary | ICD-10-CM | POA: Diagnosis not present

## 2023-11-13 NOTE — Therapy (Signed)
 OUTPATIENT PHYSICAL THERAPY TREATMENT NOTE   Patient Name: Kim [name] MRN: [id number] DOB:June 23, 1970, 53 y.o., female Today's Date: 11/13/2023  END OF SESSION:  PT End of Session - 11/13/23 1009     Visit Number 3    Number of Visits 7    Date for PT Re-Evaluation 12/11/23    Authorization Type MCD    Authorization Time Period Approved 10 visits 10/30/23-12/29/23    Authorization - Visit Number 3    Authorization - Number of Visits 10    PT Start Time 0915    PT Stop Time 1000    PT Time Calculation (min) 45 min    Activity Tolerance Patient tolerated treatment well    Behavior During Therapy [facility] for tasks assessed/performed            Past Medical History:  Diagnosis Date   Abnormal Pap smear    patient states she had cancer that was removed from her cervix   Asthma    when pregnant/ once 6 yrs ago only time asthma attack the dr said   Cancer [facility])    Clotting disorder (HCC)    Constipation    GERD (gastroesophageal reflux disease) 10/19/2015   Gestational diabetes    H. pylori infection    Hemorrhoids    Intramural leiomyoma of uterus 02/29/2016   Pulmonary embolism (HCC)    Renal insufficiency    Vitamin D  deficiency    Vitamin D  deficiency    Past Surgical History:  Procedure Laterality Date   CHOLECYSTECTOMY     Patient Active Problem List   Diagnosis Date Noted   Well woman exam with routine gynecological exam 11/02/2023   Helicobacter pylori (H. pylori) infection 09/13/2023   Polyarthralgia 08/08/2023   Traveler's diarrhea 09/21/2022   Mouth pain 11/23/2021   TMJ (sprain of temporomandibular joint), initial encounter 12/29/2020   Anxiety disorder due to general medical condition with panic attack 01/31/2020   Cerebral aneurysm without rupture 01/31/2020   Intramural leiomyoma of uterus 02/29/2016   Need for immunization against influenza 01/28/2015   Hx of pulmonary embolus during pregnancy 11/09/2014   History of gestational  diabetes    Screen for colon cancer     PCP: [name], MD  REFERRING PROVIDER: Lovie Clarity, MD  REFERRING DIAG: Polyarthralgia [M25.50]   Rationale for Evaluation and Treatment: Rehabilitation  THERAPY DIAG:  Chronic pain of right knee  Muscle weakness (generalized)  Other low back pain  Pain in right ankle and joints of right foot  PERTINENT HISTORY: No pertinent PMH on chart, reported having an aneurism  WEIGHT BEARING RESTRICTIONS: No  FALLS:  Has patient fallen in last 6 months? No  LIVING ENVIRONMENT: Lives with: lives with their family Lives in: House/apartment Stairs: Yes: External: 2 steps; occasional discomfort Has following equipment at home:   OCCUPATION: not currently working   PRECAUTIONS: None ---------------------------------------------------------------------------------------------  SUBJECTIVE:   SUBJECTIVE STATEMENT: Pt attended today's session with reports of 7/10 pain. Pt stated that they have maintained good compliance with current HEP.  Has pain in the heel, as well as back pain that limits    8/10 pain (feels like bone pain) No n/t symptoms  RED FLAGS: None   PLOF: Independent  PATIENT GOALS: reduce pain  NEXT MD VISIT: need to schedule ---------------------------------------------------------------------------------------------  OBJECTIVE:  Note: Objective measures were completed at Evaluation unless otherwise noted.  DIAGNOSTIC FINDINGS: no recent pertinent imaging  PATIENT SURVEYS:  PSFS: THE PATIENT SPECIFIC FUNCTIONAL SCALE  Place  score of 0-10 (0 = unable to perform activity and 10 = able to perform activity at the same level as before injury or problem)  Activity Date: 10/30/2023     Get on her knees 8    2.Zumba 8    3.Squat 5    4.      Total Score 21      Total Score = Sum of activity scores/number of activities  Minimally Detectable Change: 3 points (for single activity); 2 points (for  average score)  [facility] (nd). The Patient Specific Functional Scale . Retrieved from [email or web address]   COGNITION: Overall cognitive status: Within functional limits for tasks assessed     SENSATION: WFL  EDEMA:  Minimal around R knee  MUSCLE LENGTH: Deferred d/t time constraints at eval  POSTURE: No Significant postural limitations  PALPATION: Tenderness to anterior R knee   LOWER EXTREMITY ROM:  Active ROM Right eval Left eval  Hip flexion WFL! WFL  Hip extension [facility]  Hip abduction    Hip adduction    Hip internal rotation    Hip external rotation    Knee flexion [facility]  Knee extension WFL! WFL  Ankle dorsiflexion    Ankle plantarflexion    Ankle inversion    Ankle eversion     (Blank rows = not tested)  ! Indicates pain with testing  LOWER EXTREMITY MMT:  MMT Right eval Left eval  Hip flexion 3+ 4  Hip extension 4 4  Hip abduction 4- 4-  Hip adduction    Hip internal rotation    Hip external rotation    Knee flexion 4 4  Knee extension 4- 4+  Ankle dorsiflexion    Ankle plantarflexion    Ankle inversion    Ankle eversion     (Blank rows = not tested)  ! Indicates pain with testing  LOWER EXTREMITY SPECIAL TESTS:  Deferred d/t time constraints on eval  FUNCTIONAL TESTS:  Squat: limited knee flexion ankle dorsiflexion, pain in R knee and lumbar spine indicating instability along kinetic chain  GAIT: Distance walked: 121ft Assistive device utilized: None Level of assistance: Complete Independence Comments: WFL   [facility] Adult PT Treatment:                                                DATE: 11/13/2023 Neuromuscular re-ed: Heel raise with Great toe Flexion Iso on YTB 2x15, 5s hold Cable axe chops 2x12, hold 2s, 7lbs Neutral R ankle iso on half dome 2x1' Therapeutic Activity: NuStep  8' for activity tolerance Objective testing for R ankle R gastroc slant board  stretch 2x1' Pt education for shoe inserts   [facility] Adult PT Treatment:                                                DATE: 11/06/23 Therapeutic Exercise: Nustep L2 8 min Neuromuscular re-ed: Bridge 15x P-ball curl ups 15x B, 15/15 unilaterally Standing heel/toe  Therapeutic Activity: Seated hamstring stretch 30s x2 Supine QL stretch 30s x2  [facility] Adult PT Treatment:                                                DATE: 10/30/2023 Self Care: Pt education, detailed below POC discussion    PATIENT EDUCATION:  Education details: Pt received education regarding HEP performance, ADL performance, functional activity tolerance, impairment education, appropriate performance of therapeutic activities. Answering all questions. Person educated: Patient Education method: Explanation, Demonstration, Tactile cues, Verbal cues, and Handouts Education comprehension: verbalized understanding and returned demonstration  HOME EXERCISE PROGRAM: Access Code: [email or web address] URL: [email or web address] Date: 11/06/2023 Prepared by: Reyes Kohut  Exercises - Seated Table Hamstring Stretch  - 1 x daily - 5 x weekly - 1 sets - 2 reps - 30s hold - Supine Quadratus Lumborum Stretch  - 1 x daily - 5 x weekly - 1 sets - 2 reps - 30s hold - Heel Toe Raises with great toe iso on YTB  - 1 x daily - 5 x weekly - 1 sets - 15 reps - Sit to Stand Without Arm Support  - 1 x daily - 5 x weekly - 5 reps -neutral intrinsic iso on half foam roller ---------------------------------------------------------------------------------------------  ASSESSMENT:  CLINICAL IMPRESSION:  Pt attended physical therapy session for continuation of treatment regarding R ankle and low back pain. Today's treatment focused on improvement of  R ankle motility/stability, arch control, intrinsic strength. Pt is  presenting with gastrocnemius dysfunction and navicular pain, I believe secondary to a structural high arch. Pt reports this foot/ankle pain becing more limiting than back pain currently. Pt showed good tolerance to administered treatment with no adverse effects by the end of session. Skilled intervention was utilized via activity modification for pt tolerance with task completion, functional progression/regression promoting best outcomes inline with current rehab goals, as well as moderate verbal/tactile cuing alongside no physical assistance for safe and appropriate performance of today's activities.   Eval impression (10/30/2023): Pt. attended today's physical therapy session for evaluation of polyarthralgia. Pt is highly vocal and has complaints of multi joint pain with R knee and low back being the most limiting. Pt has notable deficits with R knee strength,  R knee/lumbar stability and activity tolerance with functional motions such as squatting.  Pt would benefit from therapeutic focus on kinetic chain strengthening, stability, and gradual progression of activity. Treatment performed today focused on pt education detailed in the objective. Pt demonstrated good understanding of education provided. required moderate verbal cues to stay on task and no assistance for appropriate performance with today's activities. Pt requires the intervention of skilled outpatient physical therapy to address the aforementioned deficits and progress towards a functional level in line with therapeutic goals.   OBJECTIVE IMPAIRMENTS: decreased knowledge of condition, decreased knowledge of use of DME, decreased mobility, difficulty walking, decreased strength, improper body mechanics, and pain.   ACTIVITY LIMITATIONS: carrying, squatting, sleeping, stairs, and locomotion level  PARTICIPATION LIMITATIONS: cleaning, laundry, and community activity  PERSONAL FACTORS: Behavior pattern, Fitness, Social background, and Time  since onset of injury/illness/exacerbation are also affecting patient's functional outcome.   REHAB POTENTIAL: good  CLINICAL DECISION MAKING: Evolving/moderate complexity  EVALUATION COMPLEXITY: Moderate   GOALS: Goals reviewed with patient? YES  SHORT TERM GOALS: Target date: 11/20/2023 Pt will be independent with administered HEP to demonstrate the competency necessary for long term managemnet of symptoms at home.  Baseline: Goal  status: INITIAL  LONG TERM GOALS: Target date: 12/11/2023  Pt. Will achieve a PSFS score of 24 as to demonstrate improvement in self-perceived functional ability with daily activities.  Baseline: 21 Goal status: INITIAL  2.  Pt will improve Global Hip/knee strength to a 4+/5 to demonstrate improvement in strength for quality of motion and activity performance. Baseline: see MMT chart Goal status: INITIAL  3.  Pt will report pain levels improving during ADLs to be less than or equal to 2/10 as to demonstrate improved tolerance with daily functional activities such as squatting. Baseline: 8/10 Goal status: INITIAL --------------------------------------------------------------------------------------------- PLAN:  PT FREQUENCY: 1-2x/week  PT DURATION: 6 weeks  PLANNED INTERVENTIONS: 97110-Therapeutic exercises, 97530- Therapeutic activity, 97112- Neuromuscular re-education, 97535- Self Care, [id number]- Manual therapy, [phone]- Gait training, [phone]- Aquatic Therapy, Patient/Family education, Balance training, Stair training, Joint mobilization, Spinal mobilization, and DME instructions  PLAN FOR NEXT SESSION: continue POC for R ankle motility/stability, R foot intrinsics lumbar stabilization.   Mabel Kiang, PT, DPT 11/13/2023, 10:09 AM   For all possible CPT codes, reference the Planned Interventions line above.     Check all conditions that are expected to impact treatment: {Conditions expected to impact treatment:None of these apply   If treatment  provided at initial evaluation, no treatment charged due to lack of authorization.

## 2023-11-17 NOTE — Therapy (Signed)
 OUTPATIENT PHYSICAL THERAPY TREATMENT NOTE   Patient Name: [facility] Everitt Elder MRN: [id number] DOB:[birth year], 53 y.o., female Today's Date: 11/20/2023  END OF SESSION:  PT End of Session - 11/20/23 0831     Visit Number 4    Number of Visits 7    Date for PT Re-Evaluation 12/11/23    Authorization Type MCD    Authorization Time Period Approved 10 visits 10/30/23-12/29/23    Authorization - Visit Number 4    Authorization - Number of Visits 10    PT Start Time 0830    PT Stop Time 0910    PT Time Calculation (min) 40 min    Activity Tolerance Patient tolerated treatment well    Behavior During Therapy [facility] for tasks assessed/performed             Past Medical History:  Diagnosis Date   Abnormal Pap smear    patient states she had cancer that was removed from her cervix   Asthma    when pregnant/ once 6 yrs ago only time asthma attack the dr said   Cancer [facility])    Clotting disorder (HCC)    Constipation    GERD (gastroesophageal reflux disease) 10/19/2015   Gestational diabetes    H. pylori infection    Hemorrhoids    Intramural leiomyoma of uterus 02/29/2016   Pulmonary embolism (HCC)    Renal insufficiency    Vitamin D  deficiency    Vitamin D  deficiency    Past Surgical History:  Procedure Laterality Date   CHOLECYSTECTOMY     Patient Active Problem List   Diagnosis Date Noted   Well woman exam with routine gynecological exam 11/02/2023   Helicobacter pylori (H. pylori) infection 09/13/2023   Polyarthralgia 08/08/2023   Traveler's diarrhea 09/21/2022   Mouth pain 11/23/2021   TMJ (sprain of temporomandibular joint), initial encounter 12/29/2020   Anxiety disorder due to general medical condition with panic attack 01/31/2020   Cerebral aneurysm without rupture 01/31/2020   Intramural leiomyoma of uterus 02/29/2016   Need for immunization against influenza 01/28/2015   Hx of pulmonary embolus during pregnancy 11/09/2014   History of gestational  diabetes    Screen for colon cancer     PCP: [name], MD  REFERRING PROVIDER: Lovie Clarity, MD  REFERRING DIAG: Polyarthralgia [M25.50]   Rationale for Evaluation and Treatment: Rehabilitation  THERAPY DIAG:  Chronic pain of right knee  Muscle weakness (generalized)  Other low back pain  PERTINENT HISTORY: No pertinent PMH on chart, reported having an aneurism  WEIGHT BEARING RESTRICTIONS: No  FALLS:  Has patient fallen in last 6 months? No  LIVING ENVIRONMENT: Lives with: lives with their family Lives in: House/apartment Stairs: Yes: External: 2 steps; occasional discomfort Has following equipment at home:   OCCUPATION: not currently working   PRECAUTIONS: None ---------------------------------------------------------------------------------------------  SUBJECTIVE:   SUBJECTIVE STATEMENT: Reports improved pain but continues to have R heel pain as well as low back pain.    8/10 pain (feels like bone pain) No n/t symptoms  RED FLAGS: None   PLOF: Independent  PATIENT GOALS: reduce pain  NEXT MD VISIT: need to schedule ---------------------------------------------------------------------------------------------  OBJECTIVE:  Note: Objective measures were completed at Evaluation unless otherwise noted.  DIAGNOSTIC FINDINGS: no recent pertinent imaging  PATIENT SURVEYS:  PSFS: THE PATIENT SPECIFIC FUNCTIONAL SCALE  Place score of 0-10 (0 = unable to perform activity and 10 = able to perform activity at the same level as before injury or problem)  Activity Date: 10/30/2023     Get on her knees 8    2.Zumba 8    3.Squat 5    4.      Total Score 21      Total Score = Sum of activity scores/number of activities  Minimally Detectable Change: 3 points (for single activity); 2 points (for average score)  [employer] [facility] (nd). The Patient Specific Functional Scale . Retrieved from  [email or web address]   COGNITION: Overall cognitive status: Within functional limits for tasks assessed     SENSATION: WFL  EDEMA:  Minimal around R knee  MUSCLE LENGTH: Deferred d/t time constraints at eval  POSTURE: No Significant postural limitations  PALPATION: Tenderness to anterior R knee   LOWER EXTREMITY ROM:  Active ROM Right eval Left eval  Hip flexion WFL! WFL  Hip extension [facility]  Hip abduction    Hip adduction    Hip internal rotation    Hip external rotation    Knee flexion [facility]  Knee extension WFL! WFL  Ankle dorsiflexion    Ankle plantarflexion    Ankle inversion    Ankle eversion     (Blank rows = not tested)  ! Indicates pain with testing  LOWER EXTREMITY MMT:  MMT Right eval Left eval  Hip flexion 3+ 4  Hip extension 4 4  Hip abduction 4- 4-  Hip adduction    Hip internal rotation    Hip external rotation    Knee flexion 4 4  Knee extension 4- 4+  Ankle dorsiflexion    Ankle plantarflexion    Ankle inversion    Ankle eversion     (Blank rows = not tested)  ! Indicates pain with testing  LOWER EXTREMITY SPECIAL TESTS:  Deferred d/t time constraints on eval  FUNCTIONAL TESTS:  Squat: limited knee flexion ankle dorsiflexion, pain in R knee and lumbar spine indicating instability along kinetic chain  GAIT: Distance walked: 135ft Assistive device utilized: None Level of assistance: Complete Independence Comments: WFL  [facility] Adult PT Treatment:                                                DATE: 11/20/23 Therapeutic Exercise: Nustep L4 8 min Neuromuscular re-ed: Bridge w/ball 15x P-ball curl ups 15x B, 15/15 unilaterally Standing heel raise over 4 in step 15x STS from Airex pad arms crossed 10x Therapeutic Activity: Seated hamstring stretch 30s x2 Supine QL stretch 30s x2 Supine hip fallouts GTB 15x B, 15/15 Bridge against GTB 15x S/L clams GTB 15/15  [facility] Adult PT  Treatment:                                                DATE: 11/13/2023 Neuromuscular re-ed: Heel raise with Great toe Flexion Iso on YTB 2x15, 5s hold Cable axe chops 2x12, hold 2s, 7lbs Neutral R ankle iso on half dome 2x1' Therapeutic Activity: NuStep  8' for activity tolerance Objective testing for R ankle R gastroc slant board stretch 2x1' Pt education for shoe inserts   [facility] Adult PT Treatment:  DATE: 11/06/23 Therapeutic Exercise: Nustep L2 8 min Neuromuscular re-ed: Bridge 15x P-ball curl ups 15x B, 15/15 unilaterally Standing heel/toe  Therapeutic Activity: Seated hamstring stretch 30s x2 Supine QL stretch 30s x2                                                                                                                           [facility] Adult PT Treatment:                                                DATE: 10/30/2023 Self Care: Pt education, detailed below POC discussion    PATIENT EDUCATION:  Education details: Pt received education regarding HEP performance, ADL performance, functional activity tolerance, impairment education, appropriate performance of therapeutic activities. Answering all questions. Person educated: Patient Education method: Explanation, Demonstration, Tactile cues, Verbal cues, and Handouts Education comprehension: verbalized understanding and returned demonstration  HOME EXERCISE PROGRAM: Access Code: [email or web address] URL: [email or web address] Date: 11/06/2023 Prepared by: Reyes Kohut  Exercises - Seated Table Hamstring Stretch  - 1 x daily - 5 x weekly - 1 sets - 2 reps - 30s hold - Supine Quadratus Lumborum Stretch  - 1 x daily - 5 x weekly - 1 sets - 2 reps - 30s hold - Heel Toe Raises with great toe iso on YTB  - 1 x daily - 5 x weekly - 1 sets - 15 reps - Sit to Stand Without Arm Support  - 1 x daily - 5 x weekly - 5 reps -neutral intrinsic iso on half foam  roller ---------------------------------------------------------------------------------------------  ASSESSMENT:  CLINICAL IMPRESSION: Pain complaints persist in R heel and low back w/o acknowledging much relief of low back symptoms.  Continued to emphasize core and LE strengthening as well as flexibility tasks.  Recommended return to MD if back pain persists w/o improvement.  Added additional hip and lumbopelvic strengthening tasks correcting form as needed.  Added heel raise over 4 in block to stretch plantar fascia and strengthen ankle.   Eval impression (10/30/2023): Pt. attended today's physical therapy session for evaluation of polyarthralgia. Pt is highly vocal and has complaints of multi joint pain with R knee and low back being the most limiting. Pt has notable deficits with R knee strength,  R knee/lumbar stability and activity tolerance with functional motions such as squatting.  Pt would benefit from therapeutic focus on kinetic chain strengthening, stability, and gradual progression of activity. Treatment performed today focused on pt education detailed in the objective. Pt demonstrated good understanding of education provided. required moderate verbal cues to stay on task and no assistance for appropriate performance with today's activities. Pt requires the intervention of skilled outpatient physical therapy to address the aforementioned deficits and progress towards a functional level in line with therapeutic goals.   OBJECTIVE IMPAIRMENTS: decreased knowledge of condition, decreased knowledge of  use of DME, decreased mobility, difficulty walking, decreased strength, improper body mechanics, and pain.   ACTIVITY LIMITATIONS: carrying, squatting, sleeping, stairs, and locomotion level  PARTICIPATION LIMITATIONS: cleaning, laundry, and community activity  PERSONAL FACTORS: Behavior pattern, Fitness, Social background, and Time since onset of injury/illness/exacerbation are also  affecting patient's functional outcome.   REHAB POTENTIAL: good  CLINICAL DECISION MAKING: Evolving/moderate complexity  EVALUATION COMPLEXITY: Moderate   GOALS: Goals reviewed with patient? YES  SHORT TERM GOALS: Target date: 11/20/2023 Pt will be independent with administered HEP to demonstrate the competency necessary for long term managemnet of symptoms at home.  Baseline: Goal status: INITIAL  LONG TERM GOALS: Target date: 12/11/2023  Pt. Will achieve a PSFS score of 24 as to demonstrate improvement in self-perceived functional ability with daily activities.  Baseline: 21 Goal status: INITIAL  2.  Pt will improve Global Hip/knee strength to a 4+/5 to demonstrate improvement in strength for quality of motion and activity performance. Baseline: see MMT chart Goal status: INITIAL  3.  Pt will report pain levels improving during ADLs to be less than or equal to 2/10 as to demonstrate improved tolerance with daily functional activities such as squatting. Baseline: 8/10 Goal status: INITIAL --------------------------------------------------------------------------------------------- PLAN:  PT FREQUENCY: 1-2x/week  PT DURATION: 6 weeks  PLANNED INTERVENTIONS: 97110-Therapeutic exercises, 97530- Therapeutic activity, 97112- Neuromuscular re-education, 97535- Self Care, 02859- Manual therapy, [phone]- Gait training, [id number]- Aquatic Therapy, Patient/Family education, Balance training, Stair training, Joint mobilization, Spinal mobilization, and DME instructions  PLAN FOR NEXT SESSION: continue POC for R ankle motility/stability, R foot intrinsics lumbar stabilization.   Jeff Chloie Loney PT  11/20/2023, 9:11 AM   For all possible CPT codes, reference the Planned Interventions line above.     Check all conditions that are expected to impact treatment: {Conditions expected to impact treatment:None of these apply   If treatment provided at initial evaluation, no treatment charged due to  lack of authorization.

## 2023-11-20 ENCOUNTER — Ambulatory Visit: Attending: Internal Medicine

## 2023-11-20 DIAGNOSIS — M25571 Pain in right ankle and joints of right foot: Secondary | ICD-10-CM | POA: Diagnosis not present

## 2023-11-20 DIAGNOSIS — G8929 Other chronic pain: Secondary | ICD-10-CM | POA: Diagnosis not present

## 2023-11-20 DIAGNOSIS — M6281 Muscle weakness (generalized): Secondary | ICD-10-CM | POA: Insufficient documentation

## 2023-11-20 DIAGNOSIS — M25561 Pain in right knee: Secondary | ICD-10-CM | POA: Diagnosis not present

## 2023-11-20 DIAGNOSIS — M5459 Other low back pain: Secondary | ICD-10-CM | POA: Insufficient documentation

## 2023-11-23 NOTE — Therapy (Signed)
 OUTPATIENT PHYSICAL THERAPY TREATMENT NOTE   Patient Name: Kim [name] MRN: [id number] DOB:Jun 24, 1970, 53 y.o., female Today's Date: 11/27/2023  END OF SESSION:  PT End of Session - 11/27/23 0831     Visit Number 5    Number of Visits 7    Date for PT Re-Evaluation 12/11/23    Authorization Type MCD    Authorization Time Period Approved 10 visits 10/30/23-12/29/23    Authorization - Number of Visits 10    PT Start Time 0830    PT Stop Time 0910    PT Time Calculation (min) 40 min    Activity Tolerance Patient tolerated treatment well    Behavior During Therapy [facility] for tasks assessed/performed              Past Medical History:  Diagnosis Date   Abnormal Pap smear    patient states she had cancer that was removed from her cervix   Asthma    when pregnant/ once 6 yrs ago only time asthma attack the dr said   Cancer [facility])    Clotting disorder (HCC)    Constipation    GERD (gastroesophageal reflux disease) 10/19/2015   Gestational diabetes    H. pylori infection    Hemorrhoids    Intramural leiomyoma of uterus 02/29/2016   Pulmonary embolism (HCC)    Renal insufficiency    Vitamin D  deficiency    Vitamin D  deficiency    Past Surgical History:  Procedure Laterality Date   CHOLECYSTECTOMY     Patient Active Problem List   Diagnosis Date Noted   Well woman exam with routine gynecological exam 11/02/2023   Helicobacter pylori (H. pylori) infection 09/13/2023   Polyarthralgia 08/08/2023   Traveler's diarrhea 09/21/2022   Mouth pain 11/23/2021   TMJ (sprain of temporomandibular joint), initial encounter 12/29/2020   Anxiety disorder due to general medical condition with panic attack 01/31/2020   Cerebral aneurysm without rupture 01/31/2020   Intramural leiomyoma of uterus 02/29/2016   Need for immunization against influenza 01/28/2015   Hx of pulmonary embolus during pregnancy 11/09/2014   History of gestational diabetes    Screen for colon cancer      PCP: [name], MD  REFERRING PROVIDER: Lovie Clarity, MD  REFERRING DIAG: Polyarthralgia [M25.50]   Rationale for Evaluation and Treatment: Rehabilitation  THERAPY DIAG:  Chronic pain of right knee  Muscle weakness (generalized)  Other low back pain  PERTINENT HISTORY: No pertinent PMH on chart, reported having an aneurism  WEIGHT BEARING RESTRICTIONS: No  FALLS:  Has patient fallen in last 6 months? No  LIVING ENVIRONMENT: Lives with: lives with their family Lives in: House/apartment Stairs: Yes: External: 2 steps; occasional discomfort Has following equipment at home:   OCCUPATION: not currently working   PRECAUTIONS: None ---------------------------------------------------------------------------------------------  SUBJECTIVE:   SUBJECTIVE STATEMENT: Arrives with minimal pain to report.  (Video interpreter used for today's session)  8/10 pain (feels like bone pain) No n/t symptoms  RED FLAGS: None   PLOF: Independent  PATIENT GOALS: reduce pain  NEXT MD VISIT: need to schedule ---------------------------------------------------------------------------------------------  OBJECTIVE:  Note: Objective measures were completed at Evaluation unless otherwise noted.  DIAGNOSTIC FINDINGS: no recent pertinent imaging  PATIENT SURVEYS:  PSFS: THE PATIENT SPECIFIC FUNCTIONAL SCALE  Place score of 0-10 (0 = unable to perform activity and 10 = able to perform activity at the same level as before injury or problem)  Activity Date: 10/30/2023     Get on her knees 8  2.Zumba 8    3.Squat 5    4.      Total Score 21      Total Score = Sum of activity scores/number of activities  Minimally Detectable Change: 3 points (for single activity); 2 points (for average score)  [employer] (nd). The Patient Specific Functional Scale . Retrieved from [email or web address]    COGNITION: Overall cognitive status: Within functional limits for tasks assessed     SENSATION: WFL  EDEMA:  Minimal around R knee  MUSCLE LENGTH: Deferred d/t time constraints at eval  POSTURE: No Significant postural limitations  PALPATION: Tenderness to anterior R knee   LOWER EXTREMITY ROM:  Active ROM Right eval Left eval  Hip flexion WFL! WFL  Hip extension [facility]  Hip abduction    Hip adduction    Hip internal rotation    Hip external rotation    Knee flexion [facility]  Knee extension WFL! WFL  Ankle dorsiflexion    Ankle plantarflexion    Ankle inversion    Ankle eversion     (Blank rows = not tested)  ! Indicates pain with testing  LOWER EXTREMITY MMT:  MMT Right eval Left eval  Hip flexion 3+ 4  Hip extension 4 4  Hip abduction 4- 4-  Hip adduction    Hip internal rotation    Hip external rotation    Knee flexion 4 4  Knee extension 4- 4+  Ankle dorsiflexion    Ankle plantarflexion    Ankle inversion    Ankle eversion     (Blank rows = not tested)  ! Indicates pain with testing  LOWER EXTREMITY SPECIAL TESTS:  Deferred d/t time constraints on eval  FUNCTIONAL TESTS:  Squat: limited knee flexion ankle dorsiflexion, pain in R knee and lumbar spine indicating instability along kinetic chain  GAIT: Distance walked: 164ft Assistive device utilized: None Level of assistance: Complete Independence Comments: WFL  [facility] Adult PT Treatment:                                                DATE: 11/27/23 Therapeutic Exercise: Nustep L5 8 min Neuromuscular re-ed: Bridge w/ball 15x P-ball curl ups 15x B, 15/15 unilaterally Standing heel raise over 4 in step 15x STS from Airex pad arms crossed 10x 5# KB w/OH press Therapeutic Activity: Seated hamstring stretch 30s x2 Supine QL stretch 30s x2 Supine hip fallouts BluTB 15x B, 15/15 Bridge against BluTB 15x S/L clams BluTB 15/15   [facility] Adult PT Treatment:                                                 DATE: 11/20/23 Therapeutic Exercise: Nustep L4 8 min Neuromuscular re-ed: Bridge w/ball 15x P-ball curl ups 15x B, 15/15 unilaterally Standing heel raise over 4 in step 15x STS from Airex pad arms crossed 10x Therapeutic Activity: Seated hamstring stretch 30s x2 Supine QL stretch 30s x2 Supine hip fallouts GTB 15x B, 15/15 Bridge against GTB 15x S/L clams GTB 15/15  [facility] Adult PT Treatment:  DATE: 11/13/2023 Neuromuscular re-ed: Heel raise with Great toe Flexion Iso on YTB 2x15, 5s hold Cable axe chops 2x12, hold 2s, 7lbs Neutral R ankle iso on half dome 2x1' Therapeutic Activity: NuStep  8' for activity tolerance Objective testing for R ankle R gastroc slant board stretch 2x1' Pt education for shoe inserts   [facility] Adult PT Treatment:                                                DATE: 11/06/23 Therapeutic Exercise: Nustep L2 8 min Neuromuscular re-ed: Bridge 15x P-ball curl ups 15x B, 15/15 unilaterally Standing heel/toe  Therapeutic Activity: Seated hamstring stretch 30s x2 Supine QL stretch 30s x2                                                                                                                           [facility] Adult PT Treatment:                                                DATE: 10/30/2023 Self Care: Pt education, detailed below POC discussion    PATIENT EDUCATION:  Education details: Pt received education regarding HEP performance, ADL performance, functional activity tolerance, impairment education, appropriate performance of therapeutic activities. Answering all questions. Person educated: Patient Education method: Explanation, Demonstration, Tactile cues, Verbal cues, and Handouts Education comprehension: verbalized understanding and returned demonstration  HOME EXERCISE PROGRAM: Access Code: [email or web address] URL: [email or web address] Date: 11/06/2023 Prepared by: Yaretzy Olazabal  Exercises - Seated Table Hamstring Stretch  - 1 x daily - 5 x weekly - 1 sets - 2 reps - 30s hold - Supine Quadratus Lumborum Stretch  - 1 x daily - 5 x weekly - 1 sets - 2 reps - 30s hold - Heel Toe Raises with great toe iso on YTB  - 1 x daily - 5 x weekly - 1 sets - 15 reps - Sit to Stand Without Arm Support  - 1 x daily - 5 x weekly - 5 reps -neutral intrinsic iso on half foam roller ---------------------------------------------------------------------------------------------  ASSESSMENT:  CLINICAL IMPRESSION: pain levels minimal since last session.  Able to increase resistance and workload with all tasks today.  No setback reported following added resistance/workload   Eval impression (10/30/2023): Pt. attended today's physical therapy session for evaluation of polyarthralgia. Pt is highly vocal and has complaints of multi joint pain with R knee and low back being the most limiting. Pt has notable deficits with R knee strength,  R knee/lumbar stability and activity tolerance with functional motions such as squatting.  Pt would benefit from therapeutic focus on kinetic chain strengthening, stability, and gradual progression of activity. Treatment performed today focused on pt education  detailed in the objective. Pt demonstrated good understanding of education provided. required moderate verbal cues to stay on task and no assistance for appropriate performance with today's activities. Pt requires the intervention of skilled outpatient physical therapy to address the aforementioned deficits and progress towards a functional level in line with therapeutic goals.   OBJECTIVE IMPAIRMENTS: decreased knowledge of condition, decreased knowledge of use of DME, decreased mobility, difficulty walking, decreased strength, improper body mechanics, and pain.   ACTIVITY LIMITATIONS: carrying, squatting, sleeping, stairs, and locomotion level  PARTICIPATION LIMITATIONS: cleaning, laundry, and  community activity  PERSONAL FACTORS: Behavior pattern, Fitness, Social background, and Time since onset of injury/illness/exacerbation are also affecting patient's functional outcome.   REHAB POTENTIAL: good  CLINICAL DECISION MAKING: Evolving/moderate complexity  EVALUATION COMPLEXITY: Moderate   GOALS: Goals reviewed with patient? YES  SHORT TERM GOALS: Target date: 11/20/2023 Pt will be independent with administered HEP to demonstrate the competency necessary for long term managemnet of symptoms at home.  Baseline: Goal status: INITIAL  LONG TERM GOALS: Target date: 12/11/2023  Pt. Will achieve a PSFS score of 24 as to demonstrate improvement in self-perceived functional ability with daily activities.  Baseline: 21 Goal status: INITIAL  2.  Pt will improve Global Hip/knee strength to a 4+/5 to demonstrate improvement in strength for quality of motion and activity performance. Baseline: see MMT chart Goal status: INITIAL  3.  Pt will report pain levels improving during ADLs to be less than or equal to 2/10 as to demonstrate improved tolerance with daily functional activities such as squatting. Baseline: 8/10 Goal status: INITIAL --------------------------------------------------------------------------------------------- PLAN:  PT FREQUENCY: 1-2x/week  PT DURATION: 6 weeks  PLANNED INTERVENTIONS: 97110-Therapeutic exercises, 97530- Therapeutic activity, 97112- Neuromuscular re-education, 97535- Self Care, 02859- Manual therapy, [phone]- Gait training, [phone]- Aquatic Therapy, Patient/Family education, Balance training, Stair training, Joint mobilization, Spinal mobilization, and DME instructions  PLAN FOR NEXT SESSION: continue POC for R ankle motility/stability, R foot intrinsics lumbar stabilization.   Jeff Kinslie Hove PT  11/27/2023, 9:15 AM   For all possible CPT codes, reference the Planned Interventions line above.     Check all conditions that are expected to impact  treatment: {Conditions expected to impact treatment:None of these apply   If treatment provided at initial evaluation, no treatment charged due to lack of authorization.

## 2023-11-27 ENCOUNTER — Ambulatory Visit

## 2023-11-27 DIAGNOSIS — G8929 Other chronic pain: Secondary | ICD-10-CM

## 2023-11-27 DIAGNOSIS — M25561 Pain in right knee: Secondary | ICD-10-CM | POA: Diagnosis not present

## 2023-11-27 DIAGNOSIS — M6281 Muscle weakness (generalized): Secondary | ICD-10-CM | POA: Diagnosis not present

## 2023-11-27 DIAGNOSIS — M25571 Pain in right ankle and joints of right foot: Secondary | ICD-10-CM | POA: Diagnosis not present

## 2023-11-27 DIAGNOSIS — M5459 Other low back pain: Secondary | ICD-10-CM | POA: Diagnosis not present

## 2023-11-28 DIAGNOSIS — Z419 Encounter for procedure for purposes other than remedying health state, unspecified: Secondary | ICD-10-CM | POA: Diagnosis not present

## 2023-11-30 NOTE — Therapy (Unsigned)
 OUTPATIENT PHYSICAL THERAPY TREATMENT NOTE   Patient Name: Kim [name] MRN: [id number] DOB:03-May-1970, 53 y.o., female Today's Date: 12/04/2023  END OF SESSION:  PT End of Session - 12/04/23 1054     Visit Number 6    Number of Visits 7    Date for PT Re-Evaluation 12/11/23    Authorization Type MCD    Authorization Time Period Approved 10 visits 10/30/23-12/29/23    Authorization - Visit Number 6    Authorization - Number of Visits 10    PT Start Time 1050    Activity Tolerance Patient tolerated treatment well    Behavior During Therapy [facility] for tasks assessed/performed               Past Medical History:  Diagnosis Date   Abnormal Pap smear    patient states she had cancer that was removed from her cervix   Asthma    when pregnant/ once 6 yrs ago only time asthma attack the dr said   Cancer [facility])    Clotting disorder (HCC)    Constipation    GERD (gastroesophageal reflux disease) 10/19/2015   Gestational diabetes    H. pylori infection    Hemorrhoids    Intramural leiomyoma of uterus 02/29/2016   Pulmonary embolism (HCC)    Renal insufficiency    Vitamin D  deficiency    Vitamin D  deficiency    Past Surgical History:  Procedure Laterality Date   CHOLECYSTECTOMY     Patient Active Problem List   Diagnosis Date Noted   Well woman exam with routine gynecological exam 11/02/2023   Helicobacter pylori (H. pylori) infection 09/13/2023   Polyarthralgia 08/08/2023   Traveler's diarrhea 09/21/2022   Mouth pain 11/23/2021   TMJ (sprain of temporomandibular joint), initial encounter 12/29/2020   Anxiety disorder due to general medical condition with panic attack 01/31/2020   Cerebral aneurysm without rupture 01/31/2020   Intramural leiomyoma of uterus 02/29/2016   Need for immunization against influenza 01/28/2015   Hx of pulmonary embolus during pregnancy 11/09/2014   History of gestational diabetes    Screen for colon cancer     PCP: [name], MD  REFERRING PROVIDER: Lovie Clarity, MD  REFERRING DIAG: Polyarthralgia [M25.50]   Rationale for Evaluation and Treatment: Rehabilitation  THERAPY DIAG:  Chronic pain of right knee  Muscle weakness (generalized)  Other low back pain  PERTINENT HISTORY: No pertinent PMH on chart, reported having an aneurism  WEIGHT BEARING RESTRICTIONS: No  FALLS:  Has patient fallen in last 6 months? No  LIVING ENVIRONMENT: Lives with: lives with their family Lives in: House/apartment Stairs: Yes: External: 2 steps; occasional discomfort Has following equipment at home:   OCCUPATION: not currently working   PRECAUTIONS: None ---------------------------------------------------------------------------------------------  SUBJECTIVE:   SUBJECTIVE STATEMENT: Only pain complaint remaining is R heel pain, not ankle pain.  8/10 pain (feels like bone pain) No n/t symptoms  RED FLAGS: None   PLOF: Independent  PATIENT GOALS: reduce pain  NEXT MD VISIT: need to schedule ---------------------------------------------------------------------------------------------  OBJECTIVE:  Note: Objective measures were completed at Evaluation unless otherwise noted.  DIAGNOSTIC FINDINGS: no recent pertinent imaging  PATIENT SURVEYS:  PSFS: THE PATIENT SPECIFIC FUNCTIONAL SCALE  Place score of 0-10 (0 = unable to perform activity and 10 = able to perform activity at the same level as before injury or problem)  Activity Date: 10/30/2023     Get on her knees 8    2.Zumba 8    3.Squat  5    4.      Total Score 21      Total Score = Sum of activity scores/number of activities  Minimally Detectable Change: 3 points (for single activity); 2 points (for average score)  [employer] (nd). The Patient Specific Functional Scale . Retrieved from [email or web address]   COGNITION: Overall cognitive status: Within functional  limits for tasks assessed     SENSATION: WFL  EDEMA:  Minimal around R knee  MUSCLE LENGTH: Deferred d/t time constraints at eval  POSTURE: No Significant postural limitations  PALPATION: Tenderness to anterior R knee   LOWER EXTREMITY ROM:  Active ROM Right eval Left eval  Hip flexion WFL! WFL  Hip extension [facility]  Hip abduction    Hip adduction    Hip internal rotation    Hip external rotation    Knee flexion [facility]  Knee extension WFL! WFL  Ankle dorsiflexion    Ankle plantarflexion    Ankle inversion    Ankle eversion     (Blank rows = not tested)  ! Indicates pain with testing  LOWER EXTREMITY MMT:  MMT Right eval Left eval  Hip flexion 3+ 4  Hip extension 4 4  Hip abduction 4- 4-  Hip adduction    Hip internal rotation    Hip external rotation    Knee flexion 4 4  Knee extension 4- 4+  Ankle dorsiflexion    Ankle plantarflexion    Ankle inversion    Ankle eversion     (Blank rows = not tested)  ! Indicates pain with testing  LOWER EXTREMITY SPECIAL TESTS:  Deferred d/t time constraints on eval  FUNCTIONAL TESTS:  Squat: limited knee flexion ankle dorsiflexion, pain in R knee and lumbar spine indicating instability along kinetic chain  GAIT: Distance walked: 153ft Assistive device utilized: None Level of assistance: Complete Independence Comments: WFL  [facility] Adult PT Treatment:                                                DATE: 12/04/23 Therapeutic Exercise: Nustep L6 8 min Neuromuscular re-ed: Bridge w/ball 15x P-ball curl ups 15x B, 15/15 unilaterally Standing heel raise over 4 in step 15x STS from Airex pad arms crossed 10x 10# KB w/OH press Therapeutic Activity: Seated hamstring stretch 30s x2 B Supine QL stretch 30s x2 B Supine hip fallouts BluTB 15x B, 15/15 Bridge against BluTB 15x S/L clams BluTB 15/15   [facility] Adult PT Treatment:                                                DATE: 11/27/23 Therapeutic  Exercise: Nustep L5 8 min Neuromuscular re-ed: Bridge w/ball 15x P-ball curl ups 15x B, 15/15 unilaterally Standing heel raise over 4 in step 15x STS from Airex pad arms crossed 10x 5# KB w/OH press Therapeutic Activity: Seated hamstring stretch 30s x2 Supine QL stretch 30s x2 Supine hip fallouts BluTB 15x B, 15/15 Bridge against BluTB 15x S/L clams BluTB 15/15   [facility] Adult PT Treatment:  DATE: 11/20/23 Therapeutic Exercise: Nustep L4 8 min Neuromuscular re-ed: Bridge w/ball 15x P-ball curl ups 15x B, 15/15 unilaterally Standing heel raise over 4 in step 15x STS from Airex pad arms crossed 10x Therapeutic Activity: Seated hamstring stretch 30s x2 Supine QL stretch 30s x2 Supine hip fallouts GTB 15x B, 15/15 Bridge against GTB 15x S/L clams GTB 15/15  [facility] Adult PT Treatment:                                                DATE: 11/13/2023 Neuromuscular re-ed: Heel raise with Great toe Flexion Iso on YTB 2x15, 5s hold Cable axe chops 2x12, hold 2s, 7lbs Neutral R ankle iso on half dome 2x1' Therapeutic Activity: NuStep  8' for activity tolerance Objective testing for R ankle R gastroc slant board stretch 2x1' Pt education for shoe inserts   [facility] Adult PT Treatment:                                                DATE: 11/06/23 Therapeutic Exercise: Nustep L2 8 min Neuromuscular re-ed: Bridge 15x P-ball curl ups 15x B, 15/15 unilaterally Standing heel/toe  Therapeutic Activity: Seated hamstring stretch 30s x2 Supine QL stretch 30s x2                                                                                                                           [facility] Adult PT Treatment:                                                DATE: 10/30/2023 Self Care: Pt education, detailed below POC discussion    PATIENT EDUCATION:  Education details: Pt received education regarding HEP performance, ADL performance, functional  activity tolerance, impairment education, appropriate performance of therapeutic activities. Answering all questions. Person educated: Patient Education method: Explanation, Demonstration, Tactile cues, Verbal cues, and Handouts Education comprehension: verbalized understanding and returned demonstration  HOME EXERCISE PROGRAM: Access Code: [email or web address] URL: [email or web address] Date: 11/06/2023 Prepared by: Aiken Withem  Exercises - Seated Table Hamstring Stretch  - 1 x daily - 5 x weekly - 1 sets - 2 reps - 30s hold - Supine Quadratus Lumborum Stretch  - 1 x daily - 5 x weekly - 1 sets - 2 reps - 30s hold - Heel Toe Raises with great toe iso on YTB  - 1 x daily - 5 x weekly - 1 sets - 15 reps - Sit to Stand Without Arm Support  - 1 x daily - 5 x weekly - 5 reps -neutral intrinsic iso on  half foam roller ---------------------------------------------------------------------------------------------  ASSESSMENT:  CLINICAL IMPRESSION: Only symptoms remaining are R heel pain and was instructed to f/u with referring MD to address.  Continued to focus on hip and lumbosacral strengthening to stabilize region.  Increased resistance on aerobic task and functional activities.  STGs met, recommend assessment for DC at next visit   Eval impression (10/30/2023): Pt. attended today's physical therapy session for evaluation of polyarthralgia. Pt is highly vocal and has complaints of multi joint pain with R knee and low back being the most limiting. Pt has notable deficits with R knee strength,  R knee/lumbar stability and activity tolerance with functional motions such as squatting.  Pt would benefit from therapeutic focus on kinetic chain strengthening, stability, and gradual progression of activity. Treatment performed today focused on pt education detailed in the objective. Pt demonstrated good understanding of education provided. required moderate verbal cues to stay on task and no  assistance for appropriate performance with today's activities. Pt requires the intervention of skilled outpatient physical therapy to address the aforementioned deficits and progress towards a functional level in line with therapeutic goals.   OBJECTIVE IMPAIRMENTS: decreased knowledge of condition, decreased knowledge of use of DME, decreased mobility, difficulty walking, decreased strength, improper body mechanics, and pain.   ACTIVITY LIMITATIONS: carrying, squatting, sleeping, stairs, and locomotion level  PARTICIPATION LIMITATIONS: cleaning, laundry, and community activity  PERSONAL FACTORS: Behavior pattern, Fitness, Social background, and Time since onset of injury/illness/exacerbation are also affecting patient's functional outcome.   REHAB POTENTIAL: good  CLINICAL DECISION MAKING: Evolving/moderate complexity  EVALUATION COMPLEXITY: Moderate   GOALS: Goals reviewed with patient? YES  SHORT TERM GOALS: Target date: 11/20/2023 Pt will be independent with administered HEP to demonstrate the competency necessary for long term managemnet of symptoms at home.  Baseline:  Goal status: [id number]  LONG TERM GOALS: Target date: 12/11/2023  Pt. Will achieve a PSFS score of 24 as to demonstrate improvement in self-perceived functional ability with daily activities.  Baseline: 21 Goal status: Ongoing  2.  Pt will improve Global Hip/knee strength to a 4+/5 to demonstrate improvement in strength for quality of motion and activity performance. Baseline: see MMT chart Goal status: Ongoing  3.  Pt will report pain levels improving during ADLs to be less than or equal to 2/10 as to demonstrate improved tolerance with daily functional activities such as squatting. Baseline: 8/10 Goal status: Ongoing --------------------------------------------------------------------------------------------- PLAN:  PT FREQUENCY: 1-2x/week  PT DURATION: 6 weeks  PLANNED INTERVENTIONS:  97110-Therapeutic exercises, 97530- Therapeutic activity, 97112- Neuromuscular re-education, 97535- Self Care, [id number]- Manual therapy, [phone]- Gait training, [phone]- Aquatic Therapy, Patient/Family education, Balance training, Stair training, Joint mobilization, Spinal mobilization, and DME instructions  PLAN FOR NEXT SESSION: continue POC for R ankle motility/stability, R foot intrinsics lumbar stabilization.   Jeff Matia Zelada PT  12/04/2023, 11:27 AM   For all possible CPT codes, reference the Planned Interventions line above.     Check all conditions that are expected to impact treatment: {Conditions expected to impact treatment:None of these apply   If treatment provided at initial evaluation, no treatment charged due to lack of authorization.

## 2023-12-04 ENCOUNTER — Ambulatory Visit

## 2023-12-04 DIAGNOSIS — M6281 Muscle weakness (generalized): Secondary | ICD-10-CM

## 2023-12-04 DIAGNOSIS — G8929 Other chronic pain: Secondary | ICD-10-CM | POA: Diagnosis not present

## 2023-12-04 DIAGNOSIS — M25561 Pain in right knee: Secondary | ICD-10-CM | POA: Diagnosis not present

## 2023-12-04 DIAGNOSIS — M25571 Pain in right ankle and joints of right foot: Secondary | ICD-10-CM | POA: Diagnosis not present

## 2023-12-04 DIAGNOSIS — M5459 Other low back pain: Secondary | ICD-10-CM

## 2023-12-05 ENCOUNTER — Ambulatory Visit

## 2023-12-05 ENCOUNTER — Encounter: Payer: Self-pay | Admitting: Student

## 2023-12-05 VITALS — BP 128/78 | HR 56 | Temp 97.7°F | Ht 65.0 in | Wt 193.8 lb

## 2023-12-05 DIAGNOSIS — A048 Other specified bacterial intestinal infections: Secondary | ICD-10-CM | POA: Diagnosis not present

## 2023-12-05 DIAGNOSIS — M722 Plantar fascial fibromatosis: Secondary | ICD-10-CM

## 2023-12-05 NOTE — Assessment & Plan Note (Signed)
 Test of cure was negative in July, will mark as resolved.

## 2023-12-05 NOTE — Patient Instructions (Signed)
 Remember to bring all of the medications that you take (including over the counter medications and supplements) with you to every clinic visit.  This after visit summary is an important review of tests, referrals, and medication changes that were discussed during your visit. If you have questions or concerns, call [phone]. Outside of clinic business hours, call the main hospital at [phone] and ask the operator for the on-call internal medicine resident.   Ozell Kung MD 12/05/2023, 4:05 PM

## 2023-12-05 NOTE — Assessment & Plan Note (Signed)
 Acute. History and exam seem typical for this. She's in good footwear today. Recommended rest for a few days, ice, and NSAIDs during the day as needed. Return if pain fails to improve or worsens over 2-4 weeks.

## 2023-12-05 NOTE — Progress Notes (Signed)
  Patient name: Kim [name] Date of birth: 09/07/1970 Date of visit: 12/05/23  Subjective   Reason for visit: pain in right heel  Ongoing for 1.5 months. Intermittent. Notes inflammation over the area of pain. Hurts more when she walks. Not as bad when she's not on her feet.  She's taking ibuprofen  for this currently.  Current Outpatient Medications  Medication Instructions   aluminum -magnesium  hydroxide-simethicone  (MAALOX) 200-200-20 MG/5ML SUSP 15 mLs, Oral, 3 times daily before meals & bedtime   amitriptyline  (ELAVIL ) 10 MG tablet TAKE 1 TABLET BY MOUTH EVERYDAY AT BEDTIME   Bismuth /Metronidaz/Tetracyclin (PYLERA) 140-125-125 MG CAPS 3 capsules, Oral, 3 times daily before meals & bedtime   celecoxib  (CELEBREX ) 200 mg, Oral, 2 times daily   clobetasol  ointment (TEMOVATE ) 0.05 % 1 Application, Topical, Daily   diclofenac  Sodium (VOLTAREN  ARTHRITIS PAIN) 4 g, Topical, 4 times daily PRN   dicyclomine  (BENTYL ) 20 mg, Oral, 3 times daily PRN   esomeprazole  (NEXIUM ) 20 mg, Oral, Daily   famotidine  (PEPCID ) 40 MG tablet TAKE 1 TABLET BY MOUTH EVERYDAY AT BEDTIME   omeprazole  (PRILOSEC) 20 mg, Oral, 2 times daily     Objective  Today's Vitals   12/05/23 1454  BP: 128/78  Pulse: (!) 56  Temp: 97.7 F (36.5 C)  TempSrc: Oral  SpO2: 98%  Weight: 193 lb 12.8 oz (87.9 kg)  Height: 5' 5 (1.651 m)  Body mass index is 32.25 kg/m.   Physical Exam Constitutional:      Appearance: Normal appearance.  Cardiovascular:     Rate and Rhythm: Normal rate and regular rhythm.  Pulmonary:     Effort: Pulmonary effort is normal. No respiratory distress.  Musculoskeletal:     Comments: Point anteromedial right heel tenderness  Skin:    General: Skin is warm and dry.  Neurological:     Mental Status: She is alert.     Cranial Nerves: No facial asymmetry.  Psychiatric:        Mood and Affect: Affect normal.        Speech: Speech normal.        Behavior: Behavior normal.       Assessment & Plan   Plantar fasciitis Assessment & Plan: Acute. History and exam seem typical for this. She's in good footwear today. Recommended rest for a few days, ice, and NSAIDs during the day as needed. Return if pain fails to improve or worsens over 2-4 weeks.   Helicobacter pylori (H. pylori) infection Assessment & Plan: Test of cure was negative in July, will mark as resolved.     Return in about 3 months (around 03/06/2024), or for preventive care exam in 3 months, or in 2-4 weeks if heel pain worsens or fails to improve.  Ozell Kung MD 12/05/2023, 4:22 PM

## 2023-12-06 ENCOUNTER — Other Ambulatory Visit: Payer: Self-pay | Admitting: Student

## 2023-12-07 NOTE — Telephone Encounter (Signed)
 Medication discontinued 09/26/23

## 2023-12-11 ENCOUNTER — Encounter

## 2023-12-13 ENCOUNTER — Ambulatory Visit: Admitting: Physical Therapy

## 2023-12-13 ENCOUNTER — Encounter: Payer: Self-pay | Admitting: Physical Therapy

## 2023-12-13 DIAGNOSIS — M5459 Other low back pain: Secondary | ICD-10-CM

## 2023-12-13 DIAGNOSIS — M25561 Pain in right knee: Secondary | ICD-10-CM | POA: Diagnosis not present

## 2023-12-13 DIAGNOSIS — M6281 Muscle weakness (generalized): Secondary | ICD-10-CM

## 2023-12-13 DIAGNOSIS — M25571 Pain in right ankle and joints of right foot: Secondary | ICD-10-CM

## 2023-12-13 DIAGNOSIS — G8929 Other chronic pain: Secondary | ICD-10-CM | POA: Diagnosis not present

## 2023-12-13 NOTE — Therapy (Signed)
 OUTPATIENT PHYSICAL THERAPY PROGRESS NOTE   Kim [name] Name: Kim [name] MRN: [id number] DOB:Aug 31, 1970, 53 y.o., female Today's Date: 12/13/2023  END OF SESSION:  PT End of Session - 12/13/23 1123     Visit Number 7    Number of Visits 7    Date for PT Re-Evaluation 12/11/23    Authorization - Visit Number 7    Authorization - Number of Visits 10    PT Start Time 1045    PT Stop Time 1123    PT Time Calculation (min) 38 min    Activity Tolerance Kim [name] tolerated treatment well    Behavior During Therapy WFL for tasks assessed/performed                Past Medical History:  Diagnosis Date   Abnormal Pap smear    Kim [name] states she had cancer that was removed from her cervix   Asthma    when pregnant/ once 6 yrs ago only time asthma attack the dr said   Cancer [facility])    Clotting disorder (HCC)    Constipation    GERD (gastroesophageal reflux disease) 10/19/2015   Gestational diabetes    H. pylori infection    Helicobacter pylori (H. pylori) infection 09/13/2023   Hemorrhoids    History of gestational diabetes    Hx of pulmonary embolus during pregnancy 11/09/2014   July 2016: CTA notable for pulmonary embolus to the posterior right  lower lobe. Provoked given pregnancy.     Coumadin  until 02/09/15.      Intramural leiomyoma of uterus 02/29/2016   Pulmonary embolism (HCC)    Renal insufficiency    TMJ (sprain of temporomandibular joint), initial encounter 12/29/2020   Vitamin D  deficiency    Vitamin D  deficiency    Past Surgical History:  Procedure Laterality Date   CHOLECYSTECTOMY     Kim [name] Active Problem List   Diagnosis Date Noted   Plantar fasciitis 12/05/2023   Well woman exam with routine gynecological exam 11/02/2023   Polyarthralgia 08/08/2023   Anxiety disorder due to general medical condition with panic attack 01/31/2020   Cerebral aneurysm without rupture 01/31/2020   Intramural leiomyoma of uterus 02/29/2016    PCP: [name], MD  REFERRING PROVIDER: Lovie Clarity, MD  REFERRING DIAG: Polyarthralgia [M25.50]   Rationale for Evaluation and Treatment: Rehabilitation  THERAPY DIAG:  Chronic pain of right knee  Muscle weakness (generalized)  Other low back pain  Pain in right ankle and joints of right foot  PERTINENT HISTORY: No pertinent PMH on chart, reported having an aneurism  WEIGHT BEARING RESTRICTIONS: No  FALLS:  Has Kim [name] fallen in last 6 months? No  LIVING ENVIRONMENT: Lives with: lives with their family Lives in: House/apartment Stairs: Yes: External: 2 steps; occasional discomfort Has following equipment at home:   OCCUPATION: not currently working   PRECAUTIONS: None ---------------------------------------------------------------------------------------------  SUBJECTIVE:   SUBJECTIVE STATEMENT:  Pt stated that they have maintained good compliance with current HEP.  Feels therapy has gone well for the original back pain, still having some pain and swelling in the ankle  RED FLAGS: None   PLOF: Independent  Kim [name] GOALS: reduce pain  NEXT MD VISIT: need to schedule ---------------------------------------------------------------------------------------------  OBJECTIVE:  Note: Objective measures were completed at Evaluation unless otherwise noted.  DIAGNOSTIC FINDINGS: no recent pertinent imaging  Kim [name] SURVEYS:  PSFS: THE Kim [name] SPECIFIC FUNCTIONAL SCALE  Place score of 0-10 (0 = unable to perform activity and 10 = able to perform activity  at the same level as before injury or problem)  Activity Date: 10/30/2023  12/13/2023   Get on her knees 8 9   2.Zumba 8 9   3.Squat 5 7   4.      Total Score 21 25     Total Score = Sum of activity scores/number of activities  Minimally Detectable Change: 3 points (for single activity); 2 points (for average score)  [facility] (nd). The Kim [name] Specific Functional Scale . Retrieved from  [email or web address]   COGNITION: Overall cognitive status: Within functional limits for tasks assessed     SENSATION: WFL  EDEMA:  Minimal around R knee  MUSCLE LENGTH: Deferred d/t time constraints at eval  POSTURE: No Significant postural limitations  PALPATION: Tenderness to anterior R knee   LOWER EXTREMITY ROM:  Active ROM Right eval Left eval  Hip flexion WFL! WFL  Hip extension [facility]  Hip abduction    Hip adduction    Hip internal rotation    Hip external rotation    Knee flexion [facility]  Knee extension WFL! WFL  Ankle dorsiflexion    Ankle plantarflexion    Ankle inversion    Ankle eversion     (Blank rows = not tested)  ! Indicates pain with testing  LOWER EXTREMITY MMT:  MMT Right eval 12/13/2023 Left eval 12/13/2023  Hip flexion 3+ 4+ 4 4+  Hip extension 4 4+ 4 4+  Hip abduction 4- 4+ 4- 4+  Hip adduction      Hip internal rotation      Hip external rotation      Knee flexion 4 4+ 4 4+  Knee extension 4- 4+ 4+ 4+  Ankle dorsiflexion      Ankle plantarflexion      Ankle inversion      Ankle eversion       (Blank rows = not tested)  ! Indicates pain with testing  LOWER EXTREMITY SPECIAL TESTS:  Deferred d/t time constraints on eval  FUNCTIONAL TESTS:  Squat: limited knee flexion ankle dorsiflexion, pain in R knee and lumbar spine indicating instability along kinetic chain  GAIT: Distance walked: 158ft Assistive device utilized: None Level of assistance: Complete Independence Comments: WFL   [facility] Adult PT Treatment:                                                DATE: 12/13/2023  Therapeutic Activity: Objective measures Goal assessment, functional testing Self Care: Pt education POC discussion  [facility] Adult PT Treatment:                                                DATE: 12/04/23 Therapeutic Exercise: Nustep L6 8 min Neuromuscular re-ed: Bridge w/ball 15x P-ball curl ups 15x  B, 15/15 unilaterally Standing heel raise over 4 in step 15x STS from Airex pad arms crossed 10x 10# KB w/OH press Therapeutic Activity: Seated hamstring stretch 30s x2 B Supine QL stretch 30s x2 B Supine hip fallouts BluTB 15x B, 15/15 Bridge against BluTB 15x S/L clams BluTB 15/15   [facility] Adult PT Treatment:  DATE: 11/27/23 Therapeutic Exercise: Nustep L5 8 min Neuromuscular re-ed: Bridge w/ball 15x P-ball curl ups 15x B, 15/15 unilaterally Standing heel raise over 4 in step 15x STS from Airex pad arms crossed 10x 5# KB w/OH press Therapeutic Activity: Seated hamstring stretch 30s x2 Supine QL stretch 30s x2 Supine hip fallouts BluTB 15x B, 15/15 Bridge against BluTB 15x S/L clams BluTB 15/15   [facility] Adult PT Treatment:                                                DATE: 11/20/23 Therapeutic Exercise: Nustep L4 8 min Neuromuscular re-ed: Bridge w/ball 15x P-ball curl ups 15x B, 15/15 unilaterally Standing heel raise over 4 in step 15x STS from Airex pad arms crossed 10x Therapeutic Activity: Seated hamstring stretch 30s x2 Supine QL stretch 30s x2 Supine hip fallouts GTB 15x B, 15/15 Bridge against GTB 15x S/L clams GTB 15/15  [facility] Adult PT Treatment:                                                DATE: 11/13/2023 Neuromuscular re-ed: Heel raise with Great toe Flexion Iso on YTB 2x15, 5s hold Cable axe chops 2x12, hold 2s, 7lbs Neutral R ankle iso on half dome 2x1' Therapeutic Activity: NuStep  8' for activity tolerance Objective testing for R ankle R gastroc slant board stretch 2x1' Pt education for shoe inserts   [facility] Adult PT Treatment:                                                DATE: 11/06/23 Therapeutic Exercise: Nustep L2 8 min Neuromuscular re-ed: Bridge 15x P-ball curl ups 15x B, 15/15 unilaterally Standing heel/toe  Therapeutic Activity: Seated hamstring stretch 30s x2 Supine QL stretch 30s x2                                                                                                                            [facility] Adult PT Treatment:                                                DATE: 10/30/2023 Self Care: Pt education, detailed below POC discussion    Kim [name] EDUCATION:  Education details: Pt received education regarding HEP performance, ADL performance, functional activity tolerance, impairment education, appropriate performance of therapeutic activities. Answering all questions. Person educated: Kim [name] Education method: Explanation, Demonstration, Tactile cues, Verbal cues, and  Handouts Education comprehension: verbalized understanding and returned demonstration  HOME EXERCISE PROGRAM: Access Code: [email or web address] URL: [email or web address] Date: 11/06/2023 Prepared by: Reyes Kohut  Exercises - Seated Table Hamstring Stretch  - 1 x daily - 5 x weekly - 1 sets - 2 reps - 30s hold - Supine Quadratus Lumborum Stretch  - 1 x daily - 5 x weekly - 1 sets - 2 reps - 30s hold - Heel Toe Raises with great toe iso on YTB  - 1 x daily - 5 x weekly - 1 sets - 15 reps - Sit to Stand Without Arm Support  - 1 x daily - 5 x weekly - 5 reps -neutral intrinsic iso on half foam roller ---------------------------------------------------------------------------------------------  ASSESSMENT:  CLINICAL IMPRESSION:  Pt attended physical therapy session for re-evaluation of poly arthralgia. Pt has met  all goals and is content with current functional level. Will continue to progress with zumba as tolerated. Difficulties continue with heel pain every once in a while, however is beginning to improve. Pt required minimal v/t cuing as well as no assistance for safe and appropriate performance of today's activities. Education was given to continue applying ADL education from previous sessions as well as performing HEP as prescribed with freedom to progress as tolerated using previous  education on modification and exercise dosage. Pt has displayed and verbalized competence regarding this education.     Eval impression (10/30/2023): Pt. attended today's physical therapy session for evaluation of polyarthralgia. Pt is highly vocal and has complaints of multi joint pain with R knee and low back being the most limiting. Pt has notable deficits with R knee strength,  R knee/lumbar stability and activity tolerance with functional motions such as squatting.  Pt would benefit from therapeutic focus on kinetic chain strengthening, stability, and gradual progression of activity. Treatment performed today focused on pt education detailed in the objective. Pt demonstrated good understanding of education provided. required moderate verbal cues to stay on task and no assistance for appropriate performance with today's activities. Pt requires the intervention of skilled outpatient physical therapy to address the aforementioned deficits and progress towards a functional level in line with therapeutic goals.   OBJECTIVE IMPAIRMENTS: decreased knowledge of condition, decreased knowledge of use of DME, decreased mobility, difficulty walking, decreased strength, improper body mechanics, and pain.   ACTIVITY LIMITATIONS: carrying, squatting, sleeping, stairs, and locomotion level  PARTICIPATION LIMITATIONS: cleaning, laundry, and community activity  PERSONAL FACTORS: Behavior pattern, Fitness, Social background, and Time since onset of injury/illness/exacerbation are also affecting Kim [name]'s functional outcome.   REHAB POTENTIAL: good  CLINICAL DECISION MAKING: Evolving/moderate complexity  EVALUATION COMPLEXITY: Moderate   GOALS: Goals reviewed with Kim [name]? YES  SHORT TERM GOALS: Target date: 11/20/2023 Pt will be independent with administered HEP to demonstrate the competency necessary for long term managemnet of symptoms at home.  Baseline: [email or web address] Goal status: Met  LONG TERM GOALS:  Target date: 12/11/2023  Pt. Will achieve a PSFS score of 24 as to demonstrate improvement in self-perceived functional ability with daily activities.  Baseline: 21 Goal status: MET (25)  2.  Pt will improve Global Hip/knee strength to a 4+/5 to demonstrate improvement in strength for quality of motion and activity performance. Baseline: see MMT chart Goal status: met  3.  Pt will report pain levels improving during ADLs to be less than or equal to 2/10 as to demonstrate improved tolerance with daily functional activities such as squatting. Baseline: 8/10 Goal status: met (0-1/10 in the back) --------------------------------------------------------------------------------------------- PLAN:  PT FREQUENCY: 1-2x/week  PT DURATION: 6 weeks  PLANNED INTERVENTIONS: 97110-Therapeutic exercises, 97530- Therapeutic activity, 97112- Neuromuscular re-education, [phone]- Self Care, 02859- Manual therapy, [phone]- Gait training, [phone]- Aquatic Therapy, Kim [name]/Family education, Balance training, Stair training, Joint mobilization, Spinal mobilization, and DME instructions  PLAN FOR NEXT SESSION: d/c    PHYSICAL THERAPY DISCHARGE SUMMARY  Visits from Start of Care: 7  Current functional level related to goals / functional outcomes: See assessment   Remaining deficits: See assessment   Education / Equipment: See assessment   Kim [name] agrees to discharge. Kim [name] goals were met. Kim [name] is being discharged due to being pleased with the current functional level.   Mabel Kiang, PT, DPT 12/13/2023, 11:24 AM   For all possible CPT codes, reference the Planned Interventions line above.     Check all conditions that are expected to impact treatment: {Conditions expected to impact treatment:None of these apply   If treatment provided at initial evaluation, no treatment charged due to lack of authorization.

## 2023-12-19 ENCOUNTER — Other Ambulatory Visit: Payer: Self-pay

## 2023-12-19 ENCOUNTER — Encounter: Payer: Self-pay | Admitting: Student

## 2023-12-19 ENCOUNTER — Ambulatory Visit: Admitting: Student

## 2023-12-19 VITALS — BP 112/72 | HR 56 | Temp 97.9°F | Ht 65.0 in | Wt 191.2 lb

## 2023-12-19 DIAGNOSIS — Z791 Long term (current) use of non-steroidal anti-inflammatories (NSAID): Secondary | ICD-10-CM | POA: Diagnosis not present

## 2023-12-19 DIAGNOSIS — M722 Plantar fascial fibromatosis: Secondary | ICD-10-CM

## 2023-12-19 MED ORDER — DICLOFENAC SODIUM 1 % EX GEL: 4.0000 g | Freq: Four times a day (QID) | CUTANEOUS | 1 refills | Status: AC | PRN

## 2023-12-19 NOTE — Assessment & Plan Note (Signed)
 Follow-up visit for this issue that is not improving.  The history is about 2 to 3 months of plantar foot pain that is worse with walking, distal to the heel, with some radiating pain to the lateral foot and now to the knee given subtle changes in gait.  Shares that she has obtained a brace, new sole inserts, ibuprofen  about 600 daily, and is doing exercises that she was provided at last visit.  I encouraged her to maximize use of her NSAID and also to attempt topical therapy with diclofenac  gel.  Noted that this usually takes time to resolve. I offered referral to podiatry given the ongoing chronicity and she accepted.  I am otherwise limited re additional therapies I can offer her in this clinic suspect that she needs this time. I know certain therapies such as injections, electric therapy, etc may be an option but I cannot do that in this clinic.  She was quite upset that I did not have more to offer. - Continue NSAIDs daily, add topical therapy - Continue with her supportive insoles, nighttime brace, stretches and exercises provided again, rest as able - Refer to podiatry

## 2023-12-19 NOTE — Progress Notes (Signed)
 CC: Acute visit follow up of ongoing R plantar foot pain  HPI:  [name] is a 53 y.o. female with a PMH stated below who presents today for foot pain.  Shares that she has done her best to comply with the recommendations made at the last visit and has not experienced relief in her plantar foot pain.  Please see problem based assessment and plan for additional details.  Past Medical History:  Diagnosis Date   Abnormal Pap smear    patient states she had cancer that was removed from her cervix   Asthma    when pregnant/ once 6 yrs ago only time asthma attack the dr said   Cancer [facility])    Clotting disorder (HCC)    Constipation    GERD (gastroesophageal reflux disease) 10/19/2015   Gestational diabetes    H. pylori infection    Helicobacter pylori (H. pylori) infection 09/13/2023   Hemorrhoids    History of gestational diabetes    Hx of pulmonary embolus during pregnancy 11/09/2014   July 2016: CTA notable for pulmonary embolus to the posterior right  lower lobe. Provoked given pregnancy.     Coumadin  until 02/09/15.      Intramural leiomyoma of uterus 02/29/2016   Pulmonary embolism (HCC)    Renal insufficiency    TMJ (sprain of temporomandibular joint), initial encounter 12/29/2020   Vitamin D  deficiency    Vitamin D  deficiency     Review of Systems: ROS negative except for what is noted on the assessment and plan.  Vitals:   12/19/23 0945  BP: 112/72  Pulse: (!) 56  Temp: 97.9 F (36.6 C)  TempSrc: Oral  SpO2: 96%  Weight: 191 lb 3.2 oz (86.7 kg)  Height: 5' 5 (1.651 m)    Physical Exam: Constitutional: well-appearing woman in no acute distress Cardiovascular: regular rate and rhythm, no m/r/g MSK: normal bulk and tone. Mild genearl TTP of the L plantar/foot arch. No edema or point tenderneess. No wounds. No point tenderness of knee. Strength intact. Skin: warm and dry Psych: normal mood and behavior  Assessment & Plan:   Patient  discussed with Dr. Karna  Plantar fasciitis Follow-up visit for this issue that is not improving.  The history is about 2 to 3 months of plantar foot pain that is worse with walking, distal to the heel, with some radiating pain to the lateral foot and now to the knee given subtle changes in gait.  Shares that she has obtained a brace, new sole inserts, ibuprofen  about 600 daily, and is doing exercises that she was provided at last visit.  I encouraged her to maximize use of her NSAID and also to attempt topical therapy with diclofenac  gel.  Noted that this usually takes time to resolve. I offered referral to podiatry given the ongoing chronicity and she accepted.  I am otherwise limited re additional therapies I can offer her in this clinic suspect that she needs this time. I know certain therapies such as injections, electric therapy, etc may be an option but I cannot do that in this clinic.  She was quite upset that I did not have more to offer. - Continue NSAIDs daily, add topical therapy - Continue with her supportive insoles, nighttime brace, stretches and exercises provided again, rest as able - Refer to podiatry  RTC prn for this issue. She has a regularly scheduled office visit on 11/18.  Kim [name], D.O. [facility] Internal Medicine, PGY-2 Phone: [phone]  Date 12/19/2023 Time 11:37 AM

## 2023-12-19 NOTE — Patient Instructions (Addendum)
 For pain - ibuprofen  up to 1200mg  per day. I will also give you a pain relieving gel that you may use three times daily.  I will place a referral to a foot doctor.  Most likely, this will resolve on its own in 2-3 months. Continue to use your brace. Try the exercises that are on a separate paper.   Para el dolor: ibuprofeno hasta 1200 mg al da. Tambin le recetar un gel analgsico que puede usar tres veces al [employer].  Lo derivar a un podlogo.  Lo ms probable es que se resuelva por s solo en 2 o 3 meses. Contine usando la frula. Pruebe los ejercicios que se encuentran en una hoja aparte.

## 2023-12-19 NOTE — Progress Notes (Signed)
 Internal Medicine Clinic Attending  Case discussed with the resident at the time of the visit.  We reviewed the resident's history and exam and pertinent patient test results.  I agree with the assessment, diagnosis, and plan of care documented in the resident's note.

## 2023-12-20 NOTE — Progress Notes (Signed)
 Internal Medicine Clinic Attending  Case discussed with the resident at the time of the visit.  We reviewed the resident's history and exam and pertinent patient test results.  I agree with the assessment, diagnosis, and plan of care documented in the resident's note.

## 2023-12-26 ENCOUNTER — Encounter: Payer: Self-pay | Admitting: Podiatry

## 2023-12-26 ENCOUNTER — Ambulatory Visit (INDEPENDENT_AMBULATORY_CARE_PROVIDER_SITE_OTHER): Admitting: Podiatry

## 2023-12-26 ENCOUNTER — Ambulatory Visit

## 2023-12-26 VITALS — Ht 65.0 in | Wt 191.0 lb

## 2023-12-26 DIAGNOSIS — M7751 Other enthesopathy of right foot: Secondary | ICD-10-CM | POA: Diagnosis not present

## 2023-12-26 DIAGNOSIS — M722 Plantar fascial fibromatosis: Secondary | ICD-10-CM | POA: Diagnosis not present

## 2023-12-26 DIAGNOSIS — M7731 Calcaneal spur, right foot: Secondary | ICD-10-CM | POA: Diagnosis not present

## 2023-12-26 DIAGNOSIS — R202 Paresthesia of skin: Secondary | ICD-10-CM | POA: Diagnosis not present

## 2023-12-26 MED ORDER — TRIAMCINOLONE ACETONIDE 10 MG/ML IJ SUSP
10.0000 mg | Freq: Once | INTRAMUSCULAR | Status: AC
  Administered 2023-12-26: 10 mg

## 2023-12-26 NOTE — Patient Instructions (Signed)

## 2023-12-26 NOTE — Progress Notes (Unsigned)
 Chief Complaint  Patient presents with   Foot Pain    R foot pain heel plantar. Pain 8. R 4th, 5th toe and lateral side constant numbness. R Knee has been sore from compensating. Non diabetic  No anti coag   HPI: 53 y.o. female presenting today with c/o pain in the bottom of the right heel.  Pain is rated as 8/10.  Symptoms have been present for 1.5 months.  Notes insidious onset.  Has pain with first steps out of bed in the morning and also worsened with prolonged weightbearing activities.  She is going to physical therapy for her lower back pain but does not currently have a diagnosis of what she is being treated for regarding her back pain.  She noted numbness along the outer 3 toes on the right foot.  Past Medical History:  Diagnosis Date   Abnormal Pap smear    patient states she had cancer that was removed from her cervix   Asthma    when pregnant/ once 6 yrs ago only time asthma attack the dr said   Cancer [facility])    Clotting disorder (HCC)    Constipation    GERD (gastroesophageal reflux disease) 10/19/2015   Gestational diabetes    H. pylori infection    Helicobacter pylori (H. pylori) infection 09/13/2023   Hemorrhoids    History of gestational diabetes    Hx of pulmonary embolus during pregnancy 11/09/2014   July 2016: CTA notable for pulmonary embolus to the posterior right  lower lobe. Provoked given pregnancy.     Coumadin  until 02/09/15.      Intramural leiomyoma of uterus 02/29/2016   Pulmonary embolism (HCC)    Renal insufficiency    TMJ (sprain of temporomandibular joint), initial encounter 12/29/2020   Vitamin D  deficiency    Vitamin D  deficiency    Past Surgical History:  Procedure Laterality Date   CHOLECYSTECTOMY     Allergies  Allergen Reactions   Latex Itching and Rash    Itching with use of condoms     Physical Exam: General: The patient is alert and oriented x3 in no acute distress.  Dermatology:  No ecchymosis, erythema, or edema bilateral.   No open lesions.    Vascular: Palpable pedal pulses bilaterally. Capillary refill within normal limits.  No appreciable edema.    Neurological: Decreased sensation with light touch along the right 3rd, 4th, and 5th toes in the lateral aspect of the foot on the right side only.  Musculoskeletal Exam:  There is pain on palpation of the plantarmedial & plantarcentral aspect of right heel.  No gaps or nodules within the plantar fascia.  Positive Windlass mechanism bilateral.  Antalgic gait noted with first few steps upon standing.  No pain on palpation of achilles tendon bilateral.  Ankle df less than 10 degrees with knee extended b/l.  Radiographic Exam (right foot, 3 weightbearing views, 12/26/2023):  Normal osseous mineralization. Joint spaces preserved.  No fractures noted.  Inferior calcaneal spur seen  Assessment/Plan of Care: 1. Plantar fasciitis of right foot   2. Paresthesia of right foot   3. Inferior calcaneal spur of right foot     Meds ordered this encounter  Medications   triamcinolone  acetonide (KENALOG ) 10 MG/ML injection 10 mg   FOR HOME USE ONLY DME NIGHT SPLINT FOR HOME USE ONLY DME POWER STEP INSERTS  -Reviewed etiology of plantar fasciitis with patient.  Discussed treatment options with patient today, including cortisone injection, NSAID course of treatment, stretching  exercises, physical therapy, use of night splint, rest, icing the heel, arch supports/orthotics, and supportive shoe gear.    With the patient's verbal consent, a corticosteroid injection was administered to the plantar right heel heel, consisting of a mixture of 1% lidocaine  plain, 0.5% Sensorcaine  plain, and Kenalog -10 for a total of 1.5cc administered.  A Band-aid was applied. Pain level post-injection is 5/10.  Night splint fitted and dispensed today.  This is a static AFO device with soft interface material to be worn when sleeping or nonweightbearing.  Proof of delivery and insurance waiver signed  through motion MD.  Stretching exercises printed and dispensed at checkout  Powerstep inserts fitted and dispensed.  These are not typically covered by insurance and are a self-pay item.  Patient was made aware by the medical assistant  Return in about 4 weeks (around 01/23/2024) for f/u plantar fasciitis.   Awanda CHARM Imperial, DPM, FACFAS [facility]     [address] [place], [place] [address]                Office [phone]  Fax [phone]

## 2023-12-29 ENCOUNTER — Ambulatory Visit: Admitting: Student

## 2023-12-29 ENCOUNTER — Encounter: Payer: Self-pay | Admitting: Student

## 2023-12-29 ENCOUNTER — Other Ambulatory Visit: Payer: Self-pay

## 2023-12-29 VITALS — BP 112/65 | HR 66 | Temp 98.1°F | Ht 65.0 in | Wt 191.0 lb

## 2023-12-29 DIAGNOSIS — G5761 Lesion of plantar nerve, right lower limb: Secondary | ICD-10-CM | POA: Diagnosis not present

## 2023-12-29 DIAGNOSIS — Z419 Encounter for procedure for purposes other than remedying health state, unspecified: Secondary | ICD-10-CM | POA: Diagnosis not present

## 2023-12-29 NOTE — Patient Instructions (Signed)
 Thank you, Ms.[facility] for allowing us  to provide your care today. Today we discussed about your R foot.  I advise to return to podiatrist, the foot doctor, so they can assess the nerve.   I have ordered the following labs for you:  Lab Orders  No laboratory test(s) ordered today     I will call if any are abnormal. All of your labs can be accessed through My Chart.   My Chart Access: [email or web address]  Please follow-up in:    We look forward to seeing you next time. Please call our clinic at [phone] if you have any questions or concerns. The best time to call is Monday-Friday from 9am-4pm, but there is someone available 24/7. If after hours or the weekend, call the main hospital number and ask for the Internal Medicine Resident On-Call. If you need medication refills, please notify your pharmacy one week in advance and they will send us  a request.   Thank you for letting us  take part in your care. Wishing you the best!  Heddy Barren, DO 12/29/2023, 11:59 AM [facility]

## 2023-12-29 NOTE — Progress Notes (Signed)
   Established Patient Office Visit  Subjective   Patient ID: Kim [name], female    DOB: 1971/03/08  Age: 53 y.o. MRN: [id number]  Chief Complaint  Patient presents with   Follow-up     Foot pain .  patient reports that  the sole of her foot is numb ....  Pt reports that this has been happening before     [name] is a 53 y.o. female with past medical history of  Presenting today for follow-up on back and foot pain  Last office visit on 12/19/2023 the patient was seen for plantar fasciitis, prior to that 12/05/2023.   Review of Systems:  As per assessment and Plan   Objective:     Vitals:   12/29/23 1042  BP: 112/65  Pulse: 66  Temp: 98.1 F (36.7 C)  TempSrc: Oral  SpO2: 100%  Weight: 191 lb (86.6 kg)  Height: 5' 5 (1.651 m)    Physical Exam General: Sitting in chair, no acute distress Cardiovascular: Regular rate Pulmonary: Breathing comfortably Abdomen: Soft, nontender, nondistended MSK: +changes in sensation along the lateral plantar nerve distribution on dorsal aspect of R foot, TTP R calcaneous.   The ASCVD Risk score (Arnett DK, et al., 2019) failed to calculate for the following reasons:   Cannot find a previous HDL lab   Cannot find a previous total cholesterol lab    Assessment & Plan:   Patient discussed with Dr. 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 Reports onset about 2 to 3 months ago, with primarily pain localized to the plantar region of the foot, that is worse with walking, with some radiating pain to the lateral portion of the foot. She has tried brace, new sole inserts, NSAIDs, exercise without any relief.  At the last office visit, she was referred to podiatrist. Saw podiatrist  12/26/2023 - X ray R foot that showed Normal osseous mineralization. Joint spaces preserved. No fractures noted. Inferior calcaneal spur seen. She was treated with corticosteroid injection and given night splint. Reports that minimal relief, reports pain with weight bearing.   The numbness along the region that patient described is likely from the calcaneal spur and plantar fascitis that is affecting the lateral plantar nerve. Patient is encouraged to see podiatrist for further treatment.         Return if symptoms worsen or fail to improve.    Toma Edwards, DO

## 2024-01-03 DIAGNOSIS — G5761 Lesion of plantar nerve, right lower limb: Secondary | ICD-10-CM | POA: Insufficient documentation

## 2024-01-03 NOTE — Assessment & Plan Note (Signed)
 Patient reports ongoing pain along the R heel and numbness along the lateral plantar nerve distribution on the dorsal aspect of her R foot. Reports onset about 2 to 3 months ago, with primarily pain localized to the plantar region of the foot, that is worse with walking, with some radiating pain to the lateral portion of the foot. She has tried brace, new sole inserts, NSAIDs, exercise without any relief.  At the last office visit, she was referred to podiatrist. Saw podiatrist 12/26/2023 - X ray R foot that showed Normal osseous mineralization. Joint spaces preserved. No fractures noted. Inferior calcaneal spur seen. She was treated with corticosteroid injection and given night splint. Reports that minimal relief, reports pain with weight bearing.   The numbness along the region that patient described is likely from the calcaneal spur and plantar fascitis that is affecting the lateral plantar nerve. Patient is encouraged to see podiatrist for further treatment.

## 2024-01-04 NOTE — Progress Notes (Signed)
 Internal Medicine Clinic Attending  Case discussed with the resident at the time of the visit.  We reviewed the resident's history and exam and pertinent patient test results.  I agree with the assessment, diagnosis, and plan of care documented in the resident's note.

## 2024-01-18 ENCOUNTER — Ambulatory Visit (INDEPENDENT_AMBULATORY_CARE_PROVIDER_SITE_OTHER)

## 2024-01-18 ENCOUNTER — Ambulatory Visit
Admission: EM | Admit: 2024-01-18 | Discharge: 2024-01-18 | Disposition: A | Discharge status: Home / Self Care | Attending: Family Medicine | Admitting: Family Medicine

## 2024-01-18 ENCOUNTER — Ambulatory Visit: Payer: Self-pay | Admitting: Urgent Care

## 2024-01-18 DIAGNOSIS — M19012 Primary osteoarthritis, left shoulder: Secondary | ICD-10-CM | POA: Diagnosis not present

## 2024-01-18 DIAGNOSIS — S43102A Unspecified dislocation of left acromioclavicular joint, initial encounter: Secondary | ICD-10-CM | POA: Diagnosis not present

## 2024-01-18 DIAGNOSIS — M25512 Pain in left shoulder: Secondary | ICD-10-CM

## 2024-01-18 DIAGNOSIS — S46912A Strain of unspecified muscle, fascia and tendon at shoulder and upper arm level, left arm, initial encounter: Secondary | ICD-10-CM | POA: Diagnosis not present

## 2024-01-18 MED ORDER — CYCLOBENZAPRINE HCL 5 MG PO TABS: 5.0000 mg | ORAL_TABLET | Freq: Every evening | ORAL | 0 refills | Status: AC | PRN

## 2024-01-18 MED ORDER — PREDNISONE 20 MG PO TABS: ORAL_TABLET | ORAL | 0 refills | Status: DC

## 2024-01-18 NOTE — ED Provider Notes (Addendum)
 [name]  Note:  This document was prepared using Conservation officer, historic buildings and may include unintentional dictation errors.  MRN: [id number] DOB: 03/15/1971  Subjective:   Kim [name] is a 53 y.o. female presenting for 4 day history of persistent and worsening left shoulder pain. Now has significant decreased ROM.  Symptoms started after she experienced pain following moving some heavy boxes.  Patient is active and continued on with ADLs and her Zumba classes.  Thereafter her pain dramatically worsened.  Has been using ibuprofen  with some very temporary relief.  She does have a history of a clotting disorder, has previously suffered a pulmonary embolism.  She is not anticoagulated.  No current facility-administered medications for this encounter.  Current Outpatient Medications:    aluminum -magnesium  hydroxide-simethicone  (MAALOX) 200-200-20 MG/5ML SUSP, Take 15 mLs by mouth 4 (four) times daily -  before meals and at bedtime., Disp: 355 mL, Rfl: 0   amitriptyline  (ELAVIL ) 10 MG tablet, TAKE 1 TABLET BY MOUTH EVERYDAY AT BEDTIME, Disp: 90 tablet, Rfl: 1   clobetasol  ointment (TEMOVATE ) 0.05 %, Apply 1 Application topically daily., Disp: 60 g, Rfl: 3   diclofenac  Sodium (VOLTAREN  ARTHRITIS PAIN) 1 % GEL, Apply 4 g topically 4 (four) times daily as needed (for pain)., Disp: 350 g, Rfl: 1   dicyclomine  (BENTYL ) 20 MG tablet, TAKE 1 TABLET (20 MG TOTAL) BY MOUTH 3 (THREE) TIMES DAILY AS NEEDED FOR SPASMS., Disp: 50 tablet, Rfl: 0   esomeprazole  (NEXIUM ) 20 MG capsule, Take 1 capsule (20 mg total) by mouth daily., Disp: 30 capsule, Rfl: 0   famotidine  (PEPCID ) 40 MG tablet, TAKE 1 TABLET BY MOUTH EVERYDAY AT BEDTIME, Disp: 90 tablet, Rfl: 3   Allergies  Allergen Reactions   Latex Itching and Rash    Itching with use of condoms    Past Medical History:  Diagnosis Date   Abnormal Pap smear    patient states she had cancer that was removed from  her cervix   Asthma    when pregnant/ once 6 yrs ago only time asthma attack the dr said   Cancer [facility])    Clotting disorder    Constipation    GERD (gastroesophageal reflux disease) 10/19/2015   Gestational diabetes    H. pylori infection    Helicobacter pylori (H. pylori) infection 09/13/2023   Hemorrhoids    History of gestational diabetes    Hx of pulmonary embolus during pregnancy 11/09/2014   July 2016: CTA notable for pulmonary embolus to the posterior right  lower lobe. Provoked given pregnancy.     Coumadin  until 02/09/15.      Intramural leiomyoma of uterus 02/29/2016   Pulmonary embolism (HCC)    Renal insufficiency    TMJ (sprain of temporomandibular joint), initial encounter 12/29/2020   Vitamin D  deficiency    Vitamin D  deficiency      Past Surgical History:  Procedure Laterality Date   CHOLECYSTECTOMY      Family History  Problem Relation Age of Onset   Diabetes Mother    Hypertension Mother    Hyperlipidemia Father    Diabetes Father    Hypertension Father    Heart disease Sister    Diabetes Brother    Hyperlipidemia Brother    Drug abuse Paternal Grandmother    Kidney disease Other    Birth defects Other        anal atresia/stenosis   Uterine cancer Neg Hx    Ulcerative colitis Neg  Hx    Esophageal cancer Neg Hx    Colon cancer Neg Hx     Social History   Tobacco Use   Smoking status: Never   Smokeless tobacco: Never  Vaping Use   Vaping status: Never Used  Substance Use Topics   Alcohol use: Yes    Alcohol/week: 0.0 standard drinks of alcohol    Comment: Occasionally.   Drug use: No    ROS   Objective:   Vitals: BP 112/73 (BP Location: Right Arm)   Pulse 61   Temp 97.7 F (36.5 C) (Oral)   Resp 18   LMP 11/23/2020   SpO2 96%   Physical Exam Constitutional:      General: She is not in acute distress.    Appearance: Normal appearance. She is well-developed. She is not ill-appearing, toxic-appearing or diaphoretic.  HENT:      Head: Normocephalic and atraumatic.     Nose: Nose normal.     Mouth/Throat:     Mouth: Mucous membranes are moist.  Eyes:     General: No scleral icterus.       Right Kim: No discharge.        Left Kim: No discharge.     Extraocular Movements: Extraocular movements intact.  Cardiovascular:     Rate and Rhythm: Normal rate.  Pulmonary:     Effort: Pulmonary effort is normal.  Musculoskeletal:     Left shoulder: Tenderness (AC joint, posterior and anterior deltoids) and bony tenderness present. No swelling, deformity, effusion, laceration or crepitus. Decreased range of motion. Normal strength.  Skin:    General: Skin is warm and dry.  Neurological:     General: No focal deficit present.     Mental Status: She is alert and oriented to person, place, and time.  Psychiatric:        Mood and Affect: Mood normal.        Behavior: Behavior normal.     Assessment and Plan :   PDMP not reviewed this encounter.  1. Left shoulder strain, initial encounter   2. Acute pain of left shoulder    Recommended a oral prednisone  course given severity of her pain.  Use a muscle relaxant to support the treatment as well.  Radiology overread pending.  Recommended close follow-up with an orthopedic group for consideration of local injection, physical therapy as deemed appropriate.  Counseled patient on potential for adverse effects with medications prescribed/recommended today, ER and return-to-clinic precautions discussed, patient verbalized understanding.    Christopher Savannah, PA-C 01/18/24 1016  UPDATE: DG Shoulder Left Result Date: 01/18/2024 CLINICAL DATA:  LEFT shoulder pain EXAM: LEFT SHOULDER - 2+ VIEW COMPARISON:  02/12/2020 FINDINGS: Mild degenerative changes of the LEFT acromioclavicular joint. There is slight widening of the joint space with mild elevation of the clavicle relative the acromion measuring 4 mm. This is suspicious for [facility] joint separation of uncertain chronicity. No acute  fracture.  Visualized soft tissues are unremarkable. Elongated transverse processes seen bilaterally at C7, can be a contributing factor to thoracic outlet syndrome. Please correlate with patient's symptoms. IMPRESSION: Mild elevation of the LEFT clavicle relative to the acromion is suspicious for [facility] joint separation of uncertain chronicity. Electronically Signed   By: Aliene Lloyd M.D.   On: 01/18/2024 10:48   1. Separation of left acromioclavicular joint, initial encounter   2. Acute pain of left shoulder   3. Left shoulder strain, initial encounter    Radiology over read discussed with the patient.  Emphasized need to follow-up with the orthopedist soon as possible to start physical therapy and get a general consultation.  Discussed possibility of surgical intervention but would need to be evaluated through an orthopedist for this.  In the meantime, continue with treatment plan as above.  I did not advise using a shoulder sling to prevent adhesive capsulitis.  Patient verbalized understanding.  Counseled patient on potential for adverse effects with medications prescribed/recommended today, ER and return-to-clinic precautions discussed, patient verbalized understanding.    Christopher Savannah, [place] 01/18/24 1201

## 2024-01-18 NOTE — ED Triage Notes (Signed)
 Pt reports left shoulder pain x 4 days after moving some boxes. Reports she can't raise the left arm. Ibuprofen  gives some relief.

## 2024-01-21 ENCOUNTER — Emergency Department (HOSPITAL_COMMUNITY)
Admission: EM | Admit: 2024-01-21 | Discharge: 2024-01-21 | Disposition: A | Discharge status: Home / Self Care | Attending: Emergency Medicine | Admitting: Emergency Medicine

## 2024-01-21 ENCOUNTER — Encounter (HOSPITAL_COMMUNITY): Payer: Self-pay | Admitting: *Deleted

## 2024-01-21 ENCOUNTER — Other Ambulatory Visit: Payer: Self-pay

## 2024-01-21 ENCOUNTER — Emergency Department (HOSPITAL_COMMUNITY)

## 2024-01-21 DIAGNOSIS — R21 Rash and other nonspecific skin eruption: Secondary | ICD-10-CM | POA: Diagnosis not present

## 2024-01-21 DIAGNOSIS — R519 Headache, unspecified: Secondary | ICD-10-CM

## 2024-01-21 LAB — I-STAT CHEM 8, ED
BUN: 19 mg/dL (ref 6–20)
Calcium, Ion: 1.16 mmol/L (ref 1.15–1.40)
Chloride: 106 mmol/L (ref 98–111)
Creatinine, Ser: 0.5 mg/dL (ref 0.44–1.00)
Glucose, Bld: 96 mg/dL (ref 70–99)
HCT: 38 % (ref 36.0–46.0)
Hemoglobin: 12.9 g/dL (ref 12.0–15.0)
Potassium: 3.9 mmol/L (ref 3.5–5.1)
Sodium: 140 mmol/L (ref 135–145)
TCO2: 21 mmol/L — ABNORMAL LOW (ref 22–32)

## 2024-01-21 LAB — BASIC METABOLIC PANEL WITH GFR
Anion gap: 10 (ref 5–15)
BUN: 18 mg/dL (ref 6–20)
CO2: 21 mmol/L — ABNORMAL LOW (ref 22–32)
Calcium: 8.5 mg/dL — ABNORMAL LOW (ref 8.9–10.3)
Chloride: 106 mmol/L (ref 98–111)
Creatinine, Ser: 0.53 mg/dL (ref 0.44–1.00)
GFR, Estimated: >60 mL/min (ref >60)
Glucose, Bld: 92 mg/dL (ref 70–99)
Potassium: 3.8 mmol/L (ref 3.5–5.1)
Sodium: 137 mmol/L (ref 135–145)

## 2024-01-21 LAB — CBC
HCT: 38.8 % (ref 36.0–46.0)
Hemoglobin: 12.8 g/dL (ref 12.0–15.0)
MCH: 30.7 pg (ref 26.0–34.0)
MCHC: 33 g/dL (ref 30.0–36.0)
MCV: 93 fL (ref 80.0–100.0)
Platelets: 230 K/uL (ref 150–400)
RBC: 4.17 MIL/uL (ref 3.87–5.11)
RDW: 13.7 % (ref 11.5–15.5)
WBC: 6.7 K/uL (ref 4.0–10.5)
nRBC: 0 % (ref 0.0–0.2)

## 2024-01-21 MED ORDER — METOCLOPRAMIDE HCL 5 MG/ML IJ SOLN
10.0000 mg | Freq: Once | INTRAMUSCULAR | Status: AC
  Administered 2024-01-21: 10 mg via INTRAVENOUS
  Filled 2024-01-21: qty 2

## 2024-01-21 MED ORDER — IOHEXOL 350 MG/ML SOLN
75.0000 mL | Freq: Once | INTRAVENOUS | Status: AC | PRN
  Administered 2024-01-21: 75 mL via INTRAVENOUS

## 2024-01-21 MED ORDER — SODIUM CHLORIDE 0.9 % IV BOLUS
1000.0000 mL | Freq: Once | INTRAVENOUS | Status: AC
  Administered 2024-01-21: 1000 mL via INTRAVENOUS

## 2024-01-21 MED ORDER — DIPHENHYDRAMINE HCL 50 MG/ML IJ SOLN
12.5000 mg | Freq: Once | INTRAMUSCULAR | Status: AC
  Administered 2024-01-21: 12.5 mg via INTRAVENOUS
  Filled 2024-01-21: qty 1

## 2024-01-21 NOTE — ED Provider Notes (Signed)
 [place] EMERGENCY DEPARTMENT AT [facility] Provider Note   CSN: [id number] Arrival date & time: 01/21/24  9450     Patient presents with: Headache  HPI [facility] Kim [name] is a 53 y.o. female with history of cerebral aneurysm without rupture, PE, cervical cancer presenting for headache.  Started about a week ago.  Endorses generalized headache.  Also also states that resolved after some Tylenol  but then returned 2 days ago and this time with left visual blurriness.  Denies sensitivity to light and sound but states she has had a headache like this before.  Also states she feels that her memory is being affected by the headache as well.  Denies fever or nuchal rigidity.    Headache      Prior to Admission medications   Medication Sig Start Date End Date Taking? Authorizing Provider  aluminum -magnesium  hydroxide-simethicone  (MAALOX) 200-200-20 MG/5ML SUSP Take 15 mLs by mouth 4 (four) times daily -  before meals and at bedtime. 04/13/22   Crain, Whitney L, PA  amitriptyline  (ELAVIL ) 10 MG tablet TAKE 1 TABLET BY MOUTH EVERYDAY AT BEDTIME 09/06/23   Nooruddin, Saad, MD  clobetasol  ointment (TEMOVATE ) 0.05 % Apply 1 Application topically daily. 11/02/23   Hines, Vera GAILS, MD  cyclobenzaprine  (FLEXERIL ) 5 MG tablet Take 1 tablet (5 mg total) by mouth at bedtime as needed. 01/18/24   Christopher Savannah, PA-C  diclofenac  Sodium (VOLTAREN  ARTHRITIS PAIN) 1 % GEL Apply 4 g topically 4 (four) times daily as needed (for pain). 12/19/23   Harrie Bruckner, DO  dicyclomine  (BENTYL ) 20 MG tablet TAKE 1 TABLET (20 MG TOTAL) BY MOUTH 3 (THREE) TIMES DAILY AS NEEDED FOR SPASMS. 08/21/23   Craig Alan SAUNDERS, PA-C  esomeprazole  (NEXIUM ) 20 MG capsule Take 1 capsule (20 mg total) by mouth daily. 04/13/22   Crain, Whitney L, PA  famotidine  (PEPCID ) 40 MG tablet TAKE 1 TABLET BY MOUTH EVERYDAY AT BEDTIME 08/31/23   Collier, Amanda R, PA-C  ibuprofen  (ADVIL ) 200 MG tablet Take 200 mg by mouth every 6  (six) hours as needed.    [provider]  predniSONE  (DELTASONE ) 20 MG tablet Take 2 tablets daily with breakfast. 01/18/24   Christopher Savannah, PA-C    Allergies: Latex    Review of Systems  Neurological:  Positive for headaches.    Updated Vital Signs BP (!) 115/58   Pulse (!) 52   Temp 98.1 F (36.7 C) (Oral)   Resp 20   LMP 11/23/2020   SpO2 100%   Physical Exam Vitals and nursing note reviewed.  HENT:     Head: Normocephalic and atraumatic.     Mouth/Throat:     Mouth: Mucous membranes are moist.  Eyes:     General:        Right eye: No discharge.        Left eye: No discharge.     Conjunctiva/sclera: Conjunctivae normal.     Comments:   Visual Acuity  Right Eye Distance: 10/16 Left Eye Distance: 10/16 Bilateral Distance: 10/12.5 (pt denies blurry vision)  Right Eye Near:   Left Eye Near:    Bilateral Near:      Cardiovascular:     Rate and Rhythm: Normal rate and regular rhythm.     Pulses: Normal pulses.     Heart sounds: Normal heart sounds.  Pulmonary:     Effort: Pulmonary effort is normal.     Breath sounds: Normal breath sounds.  Abdominal:  General: Abdomen is flat.     Palpations: Abdomen is soft.  Skin:    General: Skin is warm and dry.  Neurological:     General: No focal deficit present.     Comments: GCS 15. Speech is goal oriented. No deficits appreciated to CN III-XII; symmetric eyebrow raise, no facial drooping, tongue midline. Patient has equal grip strength bilaterally with 5/5 strength against resistance in all major muscle groups bilaterally. Sensation to light touch intact. Patient moves extremities without ataxia. Normal finger-nose-finger. Patient ambulatory with steady gait.  Psychiatric:        Mood and Affect: Mood normal.     (all labs ordered are listed, but only abnormal results are displayed) Labs Reviewed  BASIC METABOLIC PANEL WITH GFR - Abnormal; Notable for the following components:      Result Value   CO2 21  (*)    Calcium 8.5 (*)    All other components within normal limits  I-STAT CHEM 8, ED - Abnormal; Notable for the following components:   TCO2 21 (*)    All other components within normal limits  CBC    EKG: None  Radiology: CT Angio Head Neck W WO CM Result Date: 01/21/2024 EXAM: CTA HEAD AND NECK WITH AND WITHOUT 01/21/2024 10:15:55 AM TECHNIQUE: CTA of the head and neck was performed with and without the administration of 75 mL of iohexol  (OMNIPAQUE ) 350 MG/ML injection. Multiplanar 2D and/or 3D reformatted images are provided for review. Automated exposure control, iterative reconstruction, and/or weight based adjustment of the mA/kV was utilized to reduce the radiation dose to as low as reasonably achievable. Stenosis of the internal carotid arteries measured using NASCET criteria. COMPARISON: CT of the head dated 12/11/2022. CLINICAL HISTORY: HA with vision change. Pt is here for headache x 2 days. Pt also states that her vision is a little different in her left eye, no nausea, no sensitivity to light. Pt took tylenol  at home without relief. FINDINGS: CTA NECK: AORTIC ARCH AND ARCH VESSELS: No dissection or arterial injury. No significant stenosis of the brachiocephalic or subclavian arteries. CERVICAL CAROTID ARTERIES: No dissection, arterial injury, or hemodynamically significant stenosis by NASCET criteria. CERVICAL VERTEBRAL ARTERIES: No dissection, arterial injury, or significant stenosis. LUNGS AND MEDIASTINUM: Unremarkable. SOFT TISSUES: No acute abnormality. BONES: No acute abnormality. CTA HEAD: ANTERIOR CIRCULATION: Right ICA: 6 x 6 x 6 mm aneurysm arising medially from the anterior genu of the cavernous segment. 3 mm aneurysm arising medially from the communicating segment, seen on image 115 of series 19. 1 mm outpouching from the lateral wall of the communicating segment, seen on image 114, which could represent an aneurysm or infundibulum. Left ICA: 2 mm aneurysm arising anteriorly  from the ophthalmic segment above the takeoff of the ophthalmic artery, seen on image 116. Moderate bulbous enlargement of the supraclinoid segment demonstrated on the sagittal reformations. No significant stenosis of the anterior cerebral arteries. No significant stenosis of the middle cerebral arteries. These findings were discussed with Dr. Doretha at 10:55 AM 01/21/2024. He states these are known cerebral aneurysms and dimensions appear similar to reports from previous outside studies. POSTERIOR CIRCULATION: No significant stenosis of the posterior cerebral arteries. No significant stenosis of the basilar artery. No significant stenosis of the vertebral arteries. No aneurysm. OTHER: No dural venous sinus thrombosis on this non-dedicated study. IMPRESSION: 1. Multiple known cerebral aneurysms, including a 6 mm aneurysm in the cavernous segment of the right ICA, a 3 mm aneurysm in the communicating segment of  the right ICA, a 1 mm pouching in the communicating segment of the right ICA (differential includes aneurysm or infundibulum), and a 2 mm aneurysm in the ophthalmic segment of the left ICA. Dimensions appear similar to prior studies. 2. Moderate bulbous enlargement of the supraclinoid segment of the left ICA. Electronically signed by: Evalene Coho MD 01/21/2024 11:04 AM EDT RP Workstation: [id number]     Procedures   Medications Ordered in the ED  sodium chloride  0.9 % bolus 1,000 mL (0 mLs Intravenous Stopped 01/21/24 0857)  metoCLOPramide  (REGLAN ) injection 10 mg (10 mg Intravenous Given 01/21/24 0804)  diphenhydrAMINE  (BENADRYL ) injection 12.5 mg (12.5 mg Intravenous Given 01/21/24 0804)  iohexol  (OMNIPAQUE ) 350 MG/ML injection 75 mL (75 mLs Intravenous Contrast Given 01/21/24 1015)                                    Medical Decision Making Amount and/or Complexity of Data Reviewed Labs: ordered. Radiology: ordered.  Risk Prescription drug management.   Initial Impression and  Ddx 53 year old well-appearing female presenting for headache.  Exam unremarkable.  DDx includes ruptured cerebral aneurysm, other stroke, meningitis, migraine, electrolyte derangement, hypertensive emergency, other. Patient PMH that increases complexity of ED encounter: history of cerebral aneurysm without rupture, PE, cervical cancer  Interpretation of Diagnostics - I independent reviewed and interpreted the labs as followed: non acute  - I independently visualized the following imaging with scope of interpretation limited to determining acute life threatening conditions related to emergency care: CT angio head/neck, which revealed multiple known cerebral aneurysms otherwise nonacute.  Shared findings with patient  Patient Reassessment and Ultimate Disposition/Management On reassessment, she was feeling better and reported normal vision.  No witnessed vomiting during this encounter.  Workup overall reassuring.  CT was nonacute but did redemonstrate known cerebral aneurysms.  Reviewed her chart and findings were similar on MRA from 2020.  Advised her to follow-up with neurology.  Discussed return precautions.  Discharged.  Patient management required discussion with the following services or consulting groups:  None  Complexity of Problems Addressed Acute complicated illness or Injury  Additional Data Reviewed and Analyzed Further history obtained from: Past medical history and medications listed in the EMR and Prior ED visit notes  Patient Encounter Risk Assessment Consideration of hospitalization      Final diagnoses:  Nonintractable headache, unspecified chronicity pattern, unspecified headache type    ED Discharge Orders          Ordered    Ambulatory referral to Neurology       Comments: An appointment is requested in approximately: 2 weeks   01/21/24 1204               Lang Norleen POUR, PA-C 01/21/24 1204    Doretha Folks, MD 01/24/24 [phone]

## 2024-01-21 NOTE — Discharge Instructions (Addendum)
 Encounter today was reassuring.  Your CT scan did show known cerebral aneurysms but no observable changes.  Please follow-up with neurology.  If you have worsening headache, visual disturbance, weakness or numbness in your extremities, issues with speech or facial droop or any other concerning symptom please return to the ED for further evaluation.

## 2024-01-21 NOTE — ED Triage Notes (Signed)
 Pt is here for headache x 2 days. Pt also states that her vision is a little different in her left eye, no nausea, no sensitivity to light. Pt took tylenol  at home without relief.

## 2024-01-22 ENCOUNTER — Ambulatory Visit: Admitting: Podiatry

## 2024-01-22 ENCOUNTER — Encounter: Payer: Self-pay | Admitting: Podiatry

## 2024-01-22 DIAGNOSIS — M722 Plantar fascial fibromatosis: Secondary | ICD-10-CM | POA: Diagnosis not present

## 2024-01-22 DIAGNOSIS — G629 Polyneuropathy, unspecified: Secondary | ICD-10-CM | POA: Diagnosis not present

## 2024-01-22 DIAGNOSIS — M7751 Other enthesopathy of right foot: Secondary | ICD-10-CM

## 2024-01-22 MED ORDER — MELOXICAM 15 MG PO TABS: 15.0000 mg | ORAL_TABLET | Freq: Every day | ORAL | 1 refills | Status: AC

## 2024-01-22 MED ORDER — TRIAMCINOLONE ACETONIDE 10 MG/ML IJ SUSP
10.0000 mg | Freq: Once | INTRAMUSCULAR | Status: AC
  Administered 2024-01-22: 10 mg

## 2024-01-22 NOTE — Progress Notes (Signed)
 Chief Complaint  Patient presents with   Plantar Fasciitis     f/u plantar fasciitis Right. Cortisone inj, Stretching exercise and powersteps and night splint given. Lateral foot numbness on right. 5 pain with pressure on heel. 70% better.   HPI: 53 y.o. female presents today for recheck on right plantar fasciitis.  She notes that there has been improvement to her heel pain.  She feels that her improvement has plateaued for the right heel pain.  However, she seems to be more concerned about the numbness on the lateral 3 toes on the right foot.  She finished the physical therapy for her right hip, but is still having numbness in these toes.  She has never had a nerve conduction study performed of the lower extremity.  An interpreter is present today.  Past Medical History:  Diagnosis Date   Abnormal Pap smear    patient states she had cancer that was removed from her cervix   Asthma    when pregnant/ once 6 yrs ago only time asthma attack the dr said   Cancer [facility])    Clotting disorder    Constipation    GERD (gastroesophageal reflux disease) 10/19/2015   Gestational diabetes    H. pylori infection    Helicobacter pylori (H. pylori) infection 09/13/2023   Hemorrhoids    History of gestational diabetes    Hx of pulmonary embolus during pregnancy 11/09/2014   July 2016: CTA notable for pulmonary embolus to the posterior right  lower lobe. Provoked given pregnancy.     Coumadin  until 02/09/15.      Intramural leiomyoma of uterus 02/29/2016   Pulmonary embolism (HCC)    Renal insufficiency    TMJ (sprain of temporomandibular joint), initial encounter 12/29/2020   Vitamin D  deficiency    Vitamin D  deficiency    Past Surgical History:  Procedure Laterality Date   CHOLECYSTECTOMY     Allergies  Allergen Reactions   Latex Itching and Rash    Itching with use of condoms   Physical Exam: Palpable pedal pulses noted.  No ecchymosis or erythema is noted.  There is pain on palpation  to the plantar central and plantar medial aspect of the right heel.  No pain to the posterior heel.  Ankle dorsiflexion is approximately 10 degrees with the knee extended.  There is still decrease sensation to right toes 3, 4, and 5 as well as the dorsal lateral forefoot and midfoot.  Manual muscle testing 5/5  Assessment/Plan of Care: 1. Neuropathy   2. Plantar fasciitis of right foot   3. Bursitis of right foot     Meds ordered this encounter  Medications   meloxicam  (MOBIC ) 15 MG tablet    Sig: Take 1 tablet (15 mg total) by mouth daily.    Dispense:  30 tablet    Refill:  1   triamcinolone  acetonide (KENALOG ) 10 MG/ML injection 10 mg   AMB REFERRAL TO NEUROLOGY  I discussed findings with the patient today.  She was administered a second corticosteroid injection to the right heel consisting of a mixture of 1% lidocaine  plain, 0.5% Marcaine  plain and Kenalog  10 for total of 1.25 cc administered.  She tolerated this well and a Band-Aid was applied.  Continue with stretching exercises as well as PowerStep arch supports in her shoes.  I discussed referral for an EMG/NCV.  Will see if this issue is coming from her lower back.  I do not feel this would be localized  to some issue with the foot.  This is typically related to sciatica.  Order was placed today for [facility] neurology.  Will call patient to review results after this is completed.  For the continued pain in the heel, we will send in meloxicam  15 mg 1 tab p.o. daily x 30 days with 1 refill.   Awanda CHARM Imperial, DPM, FACFAS [facility]     [address] [place], [place] [address]                Office [phone]  Fax [phone]

## 2024-01-23 ENCOUNTER — Encounter: Payer: Self-pay | Admitting: Neurology

## 2024-01-23 ENCOUNTER — Other Ambulatory Visit: Payer: Self-pay

## 2024-01-23 DIAGNOSIS — R202 Paresthesia of skin: Secondary | ICD-10-CM

## 2024-01-24 ENCOUNTER — Ambulatory Visit: Admitting: Family

## 2024-01-24 ENCOUNTER — Encounter: Payer: Self-pay | Admitting: Family

## 2024-01-24 DIAGNOSIS — G8929 Other chronic pain: Secondary | ICD-10-CM

## 2024-01-24 DIAGNOSIS — M25512 Pain in left shoulder: Secondary | ICD-10-CM

## 2024-01-24 MED ORDER — METHYLPREDNISOLONE ACETATE 40 MG/ML IJ SUSP
40.0000 mg | INTRAMUSCULAR | Status: AC | PRN
  Administered 2024-01-24: 40 mg via INTRA_ARTICULAR

## 2024-01-24 MED ORDER — LIDOCAINE HCL 1 % IJ SOLN
5.0000 mL | INTRAMUSCULAR | Status: AC | PRN
  Administered 2024-01-24: 5 mL

## 2024-01-24 NOTE — Progress Notes (Signed)
 Office Visit Note   Patient: Kim [name]           Date of Birth: 06-Dec-1970           MRN: [id number] Visit Date: 01/24/2024              Requested by: [name], MD [address] [place],  [place] [address] PCP: [name], MD  Chief Complaint  Patient presents with   Left Shoulder - Injury      HPI: Patient is a 53 year old woman who is seen today today with the assistance of an interpreter.  She is Spanish speaking.  She reports a many months to years history of left shoulder pain which typically is minor however when she does heavy work she may have increased pain for some time about 3 weeks ago she was lifting some heavy boxes and had worsening of her left shoulder pain at that time she noted loss of range of motion pain with above head reaching she was seen in urgent care for evaluation she was placed on a course of prednisone  as well as a muscle relaxer which seemed to ease her symptoms she continues having anterior shoulder pain  Radiographs while she was at urgent care showed a 4 mm AC separation which was questionably chronic  Is left-hand dominant Assessment & Plan: Visit Diagnoses: No diagnosis found.  Plan: Depo-Medrol injection left shoulder.  Patient tolerated well.  Will proceed with outpatient physical therapy.  Follow-Up Instructions: No follow-ups on file.   Left Shoulder Exam   Tenderness  The patient is experiencing tenderness in the biceps tendon and acromioclavicular joint.  Range of Motion  The patient has normal left shoulder ROM.  Muscle Strength  The patient has normal left shoulder strength.  Tests  Impingement: positive Drop arm: negative  Other  Sensation: normal Pulse: present       Patient is alert, oriented, no adenopathy, well-dressed, normal affect, normal respiratory effort.     Imaging: No results found. No images are attached to the encounter.  Labs: Lab Results  Component  Value Date   HGBA1C 5.2 09/24/2019   HGBA1C 5.3 06/12/2017   HGBA1C 5.1 02/15/2016   ESRSEDRATE 15 08/07/2023   ESRSEDRATE 9 12/28/2020   ESRSEDRATE 9 02/15/2016   CRP 1 08/07/2023   CRP 4 12/28/2020   LABORGA GROUP B STREP (S.AGALACTIAE) ISOLATED 03/01/2016     Lab Results  Component Value Date   ALBUMIN 4.4 08/07/2023   ALBUMIN 3.5 05/19/2023   ALBUMIN 4.1 09/02/2019    No results found for: MG Lab Results  Component Value Date   VD25OH 53 11/02/2023   VD25OH 24 (L) 09/16/2016   VD25OH 28 (L) 03/01/2016    No results found for: PREALBUMIN    Latest Ref Rng & Units 01/21/2024    7:59 AM 01/21/2024    7:54 AM 08/07/2023    3:54 PM  CBC EXTENDED  WBC 4.0 - 10.5 K/uL  6.7  6.1   RBC 3.87 - 5.11 MIL/uL  4.17  4.30   Hemoglobin 12.0 - 15.0 g/dL 87.0  87.1  86.4   HCT 36.0 - 46.0 % 38.0  38.8  39.9   Platelets 150 - 400 K/uL  230  217      There is no height or weight on file to calculate BMI.  Orders:  No orders of the defined types were placed in this encounter.  No orders of the defined types  were placed in this encounter.    Procedures: Large Joint Inj: L subacromial bursa on 01/24/2024 10:11 AM Indications: pain Details: 22 G 1.5 in needle Medications: 5 mL lidocaine  1 %; 40 mg methylPREDNISolone acetate 40 MG/ML Consent was given by the patient.      Clinical Data: No additional findings.  ROS:  All other systems negative, except as noted in the HPI. Review of Systems  Objective: Vital Signs: LMP 11/23/2020   Specialty Comments:  No specialty comments available.  PMFS History: Patient Active Problem List   Diagnosis Date Noted   Neuropathy of right lateral plantar nerve 01/03/2024   Plantar fasciitis 12/05/2023   Well woman exam with routine gynecological exam 11/02/2023   Polyarthralgia 08/08/2023   Anxiety disorder due to general medical condition with panic attack 01/31/2020   Cerebral aneurysm without rupture 01/31/2020    Intramural leiomyoma of uterus 02/29/2016   History of pulmonary embolism 02/24/2015   Past Medical History:  Diagnosis Date   Abnormal Pap smear    patient states she had cancer that was removed from her cervix   Asthma    when pregnant/ once 6 yrs ago only time asthma attack the dr said   Cancer [facility])    Clotting disorder    Constipation    GERD (gastroesophageal reflux disease) 10/19/2015   Gestational diabetes    H. pylori infection    Helicobacter pylori (H. pylori) infection 09/13/2023   Hemorrhoids    History of gestational diabetes    Hx of pulmonary embolus during pregnancy 11/09/2014   July 2016: CTA notable for pulmonary embolus to the posterior right  lower lobe. Provoked given pregnancy.     Coumadin  until 02/09/15.      Intramural leiomyoma of uterus 02/29/2016   Pulmonary embolism (HCC)    Renal insufficiency    TMJ (sprain of temporomandibular joint), initial encounter 12/29/2020   Vitamin D  deficiency    Vitamin D  deficiency     Family History  Problem Relation Age of Onset   Diabetes Mother    Hypertension Mother    Hyperlipidemia Father    Diabetes Father    Hypertension Father    Heart disease Sister    Diabetes Brother    Hyperlipidemia Brother    Drug abuse Paternal Grandmother    Kidney disease Other    Birth defects Other        anal atresia/stenosis   Uterine cancer Neg Hx    Ulcerative colitis Neg Hx    Esophageal cancer Neg Hx    Colon cancer Neg Hx     Past Surgical History:  Procedure Laterality Date   CHOLECYSTECTOMY     Social History   Occupational History   Occupation: Stay at home mom  Tobacco Use   Smoking status: Never   Smokeless tobacco: Never  Vaping Use   Vaping status: Never Used  Substance and Sexual Activity   Alcohol use: Yes    Alcohol/week: 0.0 standard drinks of alcohol    Comment: Occasionally.   Drug use: Never   Sexual activity: Yes    Partners: Male    Birth control/protection: None

## 2024-01-26 ENCOUNTER — Encounter: Payer: Self-pay | Admitting: Neurology

## 2024-02-05 ENCOUNTER — Encounter: Payer: Self-pay | Admitting: Obstetrics and Gynecology

## 2024-02-05 ENCOUNTER — Ambulatory Visit: Admitting: Obstetrics and Gynecology

## 2024-02-05 VITALS — BP 122/78 | HR 54 | Temp 97.8°F | Wt 188.0 lb

## 2024-02-05 DIAGNOSIS — N904 Leukoplakia of vulva: Secondary | ICD-10-CM

## 2024-02-05 DIAGNOSIS — N898 Other specified noninflammatory disorders of vagina: Secondary | ICD-10-CM

## 2024-02-05 DIAGNOSIS — R350 Frequency of micturition: Secondary | ICD-10-CM

## 2024-02-05 LAB — URINALYSIS, COMPLETE W/RFL CULTURE
Bacteria, UA: NONE SEEN /HPF
Bilirubin Urine: NEGATIVE
Glucose, UA: NEGATIVE
Hgb urine dipstick: NEGATIVE
Hyaline Cast: NONE SEEN /LPF
Ketones, ur: NEGATIVE
Leukocyte Esterase: NEGATIVE
Nitrites, Initial: NEGATIVE
Protein, ur: NEGATIVE
RBC / HPF: NONE SEEN /HPF (ref 0–2)
Specific Gravity, Urine: 1.01 (ref 1.001–1.035)
WBC, UA: NONE SEEN /HPF (ref 0–5)
pH: 6.5 (ref 5.0–8.0)

## 2024-02-05 LAB — NO CULTURE INDICATED

## 2024-02-05 LAB — WET PREP FOR TRICH, YEAST, CLUE

## 2024-02-05 MED ORDER — ESTRADIOL 0.01 % VA CREA: TOPICAL_CREAM | VAGINAL | 1 refills | Status: AC

## 2024-02-05 NOTE — Progress Notes (Signed)
 53 y.o. [id number] female with fibroids, lichen sclerosus, H/O PE (provoked, during pregnancy, 2016) here for med f/u. Married. Due to language barrier, an interpreter was present during the history-taking and subsequent discussion (and for part of the physical exam) with this patient.  Patient's last menstrual period was 11/23/2020.   LS - feels clobetasol  is helping. However, she reports some burning with urination x 2 months. Vaginal irritation and pain with intercourse. She is not interested in having sex anymore because of the pain.  Urine sample provided: Yes  GYN HISTORY: No sig hx  OB History  Gravida Para Term Preterm AB Living  5 5 5   5   SAB IAB Ectopic Multiple Live Births     0 5    # Outcome Date GA Lbr Len/2nd Weight Sex Type Anes PTL Lv  5 Term 11/04/14 [redacted]w[redacted]d  7 lb 8.5 oz (3.416 kg) F Vag-Spont EPI  LIV  4 Term 03/18/08 [redacted]w[redacted]d    Vag-Spont  N LIV  3 Term 11/07/00 [redacted]w[redacted]d    Vag-Spont  N LIV  2 Term 07/09/90 [redacted]w[redacted]d    Vag-Spont  N LIV  1 Term 08/24/89 [redacted]w[redacted]d    Vag-Spont      Past Medical History:  Diagnosis Date   Abnormal Pap smear    patient states she had cancer that was removed from her cervix   Asthma    when pregnant/ once 6 yrs ago only time asthma attack the dr said   Cancer [facility])    Clotting disorder    Constipation    GERD (gastroesophageal reflux disease) 10/19/2015   Gestational diabetes    H. pylori infection    Helicobacter pylori (H. pylori) infection 09/13/2023   Hemorrhoids    History of gestational diabetes    Hx of pulmonary embolus during pregnancy 11/09/2014   July 2016: CTA notable for pulmonary embolus to the posterior right  lower lobe. Provoked given pregnancy.     Coumadin  until 02/09/15.      Intramural leiomyoma of uterus 02/29/2016   Pulmonary embolism (HCC)    Renal insufficiency    TMJ (sprain of temporomandibular joint), initial encounter 12/29/2020   Vitamin D  deficiency    Vitamin D  deficiency    Past Surgical History:   Procedure Laterality Date   CHOLECYSTECTOMY     Current Outpatient Medications on File Prior to Visit  Medication Sig Dispense Refill   aluminum -magnesium  hydroxide-simethicone  (MAALOX) 200-200-20 MG/5ML SUSP Take 15 mLs by mouth 4 (four) times daily -  before meals and at bedtime. 355 mL 0   amitriptyline  (ELAVIL ) 10 MG tablet TAKE 1 TABLET BY MOUTH EVERYDAY AT BEDTIME 90 tablet 1   clobetasol  ointment (TEMOVATE ) 0.05 % Apply 1 Application topically daily. 60 g 3   cyclobenzaprine  (FLEXERIL ) 5 MG tablet Take 1 tablet (5 mg total) by mouth at bedtime as needed. 30 tablet 0   diclofenac  Sodium (VOLTAREN  ARTHRITIS PAIN) 1 % GEL Apply 4 g topically 4 (four) times daily as needed (for pain). 350 g 1   dicyclomine  (BENTYL ) 20 MG tablet TAKE 1 TABLET (20 MG TOTAL) BY MOUTH 3 (THREE) TIMES DAILY AS NEEDED FOR SPASMS. 50 tablet 0   esomeprazole  (NEXIUM ) 20 MG capsule Take 1 capsule (20 mg total) by mouth daily. 30 capsule 0   famotidine  (PEPCID ) 40 MG tablet TAKE 1 TABLET BY MOUTH EVERYDAY AT BEDTIME 90 tablet 3   meloxicam  (MOBIC ) 15 MG tablet Take 1 tablet (15 mg total) by mouth daily. 30  tablet 1   No current facility-administered medications on file prior to visit.    Allergies  Allergen Reactions   Latex Itching and Rash    Itching with use of condoms     PE Today's Vitals   02/05/24 0940  BP: 122/78  Pulse: (!) 54  Temp: 97.8 F (36.6 C)  TempSrc: Oral  SpO2: 97%  Weight: 188 lb (85.3 kg)   Body mass index is 31.28 kg/m.  Physical Exam Vitals reviewed. Exam conducted with a chaperone present.  Constitutional:      General: She is not in acute distress.    Appearance: Normal appearance.  HENT:     Head: Normocephalic and atraumatic.     Nose: Nose normal.  Eyes:     Extraocular Movements: Extraocular movements intact.     Conjunctiva/sclera: Conjunctivae normal.  Pulmonary:     Effort: Pulmonary effort is normal.  Genitourinary:    Exam position: Lithotomy position.      Vagina: Normal. No vaginal discharge.     Cervix: Normal. No cervical motion tenderness, discharge or lesion.     Uterus: Normal. Not enlarged and not tender.      Adnexa: Right adnexa normal and left adnexa normal.      Comments: VV atrophy Hypopigmentation of posterior forchette, overall improved Musculoskeletal:        General: Normal range of motion.     Cervical back: Normal range of motion.  Neurological:     General: No focal deficit present.     Mental Status: She is alert.  Psychiatric:        Mood and Affect: Mood normal.        Behavior: Behavior normal.      Assessment and Plan:        Urinary frequency -     Urinalysis,Complete w/RFL Culture  Vaginal irritation -     WET PREP FOR TRICH, YEAST, CLUE  Lichen sclerosus et atrophicus of the vulva -     Estradiol; Apply 0.5g to vulva 2 times a week at night.  Dispense: 42.5 g; Refill: 1  Patient with GSM symptoms. History of provoked PE during pregnancy, 2016 Discussed option for vaginal estrogen Reviewed safety profile of low dose vaginal estrogen, however reviewed that higher doses have been associated with DVT, breast and uterine cancer.  Literature reviewed: 2024 study by Eckert-Lind et al showed no increase risk of DVT with PV estrogen use of patient with hx of prior DVT. Safe to start low dose, topical estrogen for symptoms After discussion, she would like to start vaginal estrogen Also recommend decreasing clobetasol  to twice weekly RTO for annual or PRN   Vera LULLA Pa, MD

## 2024-02-05 NOTE — Progress Notes (Unsigned)
 NEUROLOGY CONSULTATION NOTE  Kim [name] MRN: [id number] DOB: 04/20/1970  Referring provider: Norleen Essex, PA-C (ED referral) Primary care provider: Ozell Kung, MD  Reason for consult:  headache  Assessment/Plan:   Migraine without aura, without status migrainosus, not intractable Cerebral aneurysms, one is 6mm in the cavernous segment of the right ICA.   Regarding the aneurysms, will refer to interventional/endovascular neuroradiology Migraine prevention:  restart amitriptyline  10mg  at bedtime (previously effective).   Migraine rescue:  Tylenol  or ibuprofen  Lifestyle modification: Limit use of pain relievers to no more than 9 days out of the month to prevent risk of rebound or medication-overuse headache. Diet modification/hydration/caffeine cessation Routine exercise Sleep hygiene Consider vitamins/supplements:  magnesium  citrate 400mg  daily, riboflavin 400mg  daily, CoQ10 100mg  three times daily Keep headache diary Follow up 7 months.    Subjective:  Kim [name] is a 53 year old right-handed female with multiple cerebral aneurysms and history of pulmonary embolism and cervical cancer who presents for headache.  History supplemented by ED note.  She is accompanied by an interpreter.  Headaches started when she was 67 or 53 years old.  They are typically moderate sharp and pulsating pain put particularly bilateral frontal and left temporal region.  Associated with nausea, phonophobia and blurred vision.  No associated photophobia, vomiting, neck pain, unilateral numbness or weakness.  They usually occur 2 to 3 days a week and usually last 4 to 5 hours with Tylenol  and ibuprofen  (takes 2 to 3 days a week).  Emotional stress tends to be a trigger.   Seen in the ED at [facility] on 01/21/2024 for a severe ongoing headache for 2 days.  CTA head and neck revealed multiple know cerebral aneurysms, including 6 mm aneurysm in cavernous segment of  right ICA, 1 mm outpouching in communicating segment of right ICA, 2 mm aneurysm in the ophthalmic segment of left ICA, , as well as moderate bulbous enlargement of the supraclinoid segment of the left ICA.  Treated with headache cocktail of Benadryl  and Reglan   Past NSAIDS/analgesics:  celecoxib , naproxen , tramadol , diclofenac  Past abortive triptans:  none Past abortive ergotamine:  none Past muscle relaxants:  methocarbamol  Past anti-emetic:  Zofran  4mg , Reglan  10mg  Past antihypertensive medications:  none Past antidepressant medications:  citalopram , escitalopram  Past anticonvulsant medications:  gabapentin  Past anti-CGRP:  none Past vitamins/Herbal/Supplements:  none Past antihistamines/decongestants:  Flonase , Astelin  Other past therapies:  none  Current NSAIDS/analgesics:  Tylenol , ibuprofen , meloxicam  15mg  daily Current triptans:  none Current ergotamine:  none Current anti-emetic:  none Current muscle relaxants:  Flexeril  5mg  at bedtime PRN Current Antihypertensive medications:  none Current Antidepressant medications:  amitriptyline  10mg  at bedtime(only took for 3 months because didn't have refills - didn't have headaches while on it) Current Anticonvulsant medications:  none Current anti-CGRP:  none Current Vitamins/Herbal/Supplements:  none Current Antihistamines/Decongestants:  none Other therapy:  none   Caffeine:  cup of tea daily; coffee once in awhile.   Diet:  drinks 4 to 5 bottles of water daily.  Eats fish, chicken, beef, rice, beans.   Exercise:  Zumba class daily. Depression:  uncertain; Anxiety:  some Sleep hygiene:  Goes to bed at 11PM; trouble falling asleep, wakes up in middle of night and trouble falling back asleep.  Has to get up at 6 AM.  History of TBI/concussion:  in November 2024, she fell in the shower and hit the back of her head on the right. No LOC.  Seen in the ED.  CT head unremarkable. Family history of headache:  mother Family history of  cerebral aneurysm:  no      PAST MEDICAL HISTORY: Past Medical History:  Diagnosis Date   Abnormal Pap smear    patient states she had cancer that was removed from her cervix   Asthma    when pregnant/ once 6 yrs ago only time asthma attack the dr said   Cancer [facility])    Clotting disorder    Constipation    GERD (gastroesophageal reflux disease) 10/19/2015   Gestational diabetes    H. pylori infection    Helicobacter pylori (H. pylori) infection 09/13/2023   Hemorrhoids    History of gestational diabetes    Hx of pulmonary embolus during pregnancy 11/09/2014   July 2016: CTA notable for pulmonary embolus to the posterior right  lower lobe. Provoked given pregnancy.     Coumadin  until 02/09/15.      Intramural leiomyoma of uterus 02/29/2016   Pulmonary embolism (HCC)    Renal insufficiency    TMJ (sprain of temporomandibular joint), initial encounter 12/29/2020   Vitamin D  deficiency    Vitamin D  deficiency     PAST SURGICAL HISTORY: Past Surgical History:  Procedure Laterality Date   CHOLECYSTECTOMY      MEDICATIONS: Current Outpatient Medications on File Prior to Visit  Medication Sig Dispense Refill   aluminum -magnesium  hydroxide-simethicone  (MAALOX) 200-200-20 MG/5ML SUSP Take 15 mLs by mouth 4 (four) times daily -  before meals and at bedtime. 355 mL 0   amitriptyline  (ELAVIL ) 10 MG tablet TAKE 1 TABLET BY MOUTH EVERYDAY AT BEDTIME 90 tablet 1   clobetasol  ointment (TEMOVATE ) 0.05 % Apply 1 Application topically daily. 60 g 3   cyclobenzaprine  (FLEXERIL ) 5 MG tablet Take 1 tablet (5 mg total) by mouth at bedtime as needed. 30 tablet 0   diclofenac  Sodium (VOLTAREN  ARTHRITIS PAIN) 1 % GEL Apply 4 g topically 4 (four) times daily as needed (for pain). 350 g 1   dicyclomine  (BENTYL ) 20 MG tablet TAKE 1 TABLET (20 MG TOTAL) BY MOUTH 3 (THREE) TIMES DAILY AS NEEDED FOR SPASMS. 50 tablet 0   esomeprazole  (NEXIUM ) 20 MG capsule Take 1 capsule (20 mg total) by mouth daily. 30  capsule 0   estradiol (ESTRACE) 0.01 % CREA vaginal cream Apply 0.5g to vulva 2 times a week at night. 42.5 g 1   famotidine  (PEPCID ) 40 MG tablet TAKE 1 TABLET BY MOUTH EVERYDAY AT BEDTIME 90 tablet 3   meloxicam  (MOBIC ) 15 MG tablet Take 1 tablet (15 mg total) by mouth daily. 30 tablet 1   No current facility-administered medications on file prior to visit.    ALLERGIES: Allergies  Allergen Reactions   Latex Itching and Rash    Itching with use of condoms    FAMILY HISTORY: Family History  Problem Relation Age of Onset   Diabetes Mother    Hypertension Mother    Alzheimer's disease Mother    Hyperlipidemia Father    Diabetes Father    Hypertension Father    Heart disease Sister    Diabetes Brother    Hyperlipidemia Brother    Drug abuse Paternal Grandmother    Kidney disease Other    Birth defects Other        anal atresia/stenosis   Uterine cancer Neg Hx    Ulcerative colitis Neg Hx    Esophageal cancer Neg Hx    Colon cancer Neg Hx     Objective:  Blood pressure  122/80, pulse 67, height 5' 5 (1.651 m), weight 190 lb (86.2 kg), last menstrual period 11/23/2020, SpO2 98%. General: No acute distress.  Patient appears well-groomed.   Head:  Normocephalic/atraumatic Eyes:  fundi examined but not visualized Neck: supple, no paraspinal tenderness, full range of motion Heart: regular rate and rhythm Neurological Exam: Mental status: alert and oriented to person, place, and time, speech fluent and not dysarthric, language intact. Cranial nerves: CN I: not tested CN II: pupils equal, round and reactive to light, visual fields intact CN III, IV, VI:  full range of motion, no nystagmus, no ptosis CN V: facial sensation intact. CN VII: upper and lower face symmetric CN VIII: hearing intact CN IX, X: gag intact, uvula midline CN XI: sternocleidomastoid and trapezius muscles intact CN XII: tongue midline Bulk & Tone: normal, no fasciculations. Motor:  muscle strength  5/5 throughout Sensation:  Pinprick and vibratory sensation intact. Deep Tendon Reflexes:  2+ throughout,  toes downgoing.   Finger to nose testing:  Without dysmetria.   Gait:  Normal station and stride.  Romberg negative.    Thank you for allowing me to take part in the care of this patient.  Juliene Dunnings, DO  CC: Ozell Kung, MD

## 2024-02-06 ENCOUNTER — Encounter: Payer: Self-pay | Admitting: Neurology

## 2024-02-06 ENCOUNTER — Ambulatory Visit (INDEPENDENT_AMBULATORY_CARE_PROVIDER_SITE_OTHER): Admitting: Neurology

## 2024-02-06 VITALS — BP 122/80 | HR 67 | Ht 65.0 in | Wt 190.0 lb

## 2024-02-06 DIAGNOSIS — I671 Cerebral aneurysm, nonruptured: Secondary | ICD-10-CM | POA: Diagnosis not present

## 2024-02-06 DIAGNOSIS — G43009 Migraine without aura, not intractable, without status migrainosus: Secondary | ICD-10-CM | POA: Diagnosis not present

## 2024-02-06 NOTE — Patient Instructions (Addendum)
 Start amitriptyline  10mg  at bedtime.  If headaches not improved in 6 to 8 weeks, contact me and we can increase dose May take Tylenol  or ibuprofen  for headache but limit to no more than 9 days out of the month to prevent rebound headache. Keep headache diary. I will refer you to Dr. Nancyann Burns, who is upstairs.  He specializes in treating aneurysms. Follow up in 7 months.   1. Comience con amitriptilina 10 mg antes de acostarse. Si el dolor de cabeza no mejora en 6 a 8 semanas, contcteme para aumentar la dosis. 2. Puede tomar Tylenol  o ibuprofeno para el dolor de cabeza, pero limtelo a no ms de [address] al mes para prevenir el efecto rebote. 3. Lleve un diario de sus dolores de [place]. 4. Le derivar al Dr. Nancyann Burns, que est sheryll. Se especializa en el tratamiento de aneurismas. 5. Haga una cita de seguimiento en 7 meses.

## 2024-02-07 ENCOUNTER — Ambulatory Visit
Admission: RE | Admit: 2024-02-07 | Discharge: 2024-02-07 | Disposition: A | Discharge status: Home / Self Care | Payer: Self-pay | Source: Ambulatory Visit | Attending: Neuroradiology | Admitting: Neuroradiology

## 2024-02-07 ENCOUNTER — Ambulatory Visit
Admission: RE | Admit: 2024-02-07 | Discharge: 2024-02-07 | Disposition: A | Payer: Self-pay | Source: Ambulatory Visit | Attending: Neuroradiology | Admitting: Neuroradiology

## 2024-02-07 ENCOUNTER — Encounter: Payer: Self-pay | Admitting: Neuroradiology

## 2024-02-07 ENCOUNTER — Ambulatory Visit: Admitting: Neuroradiology

## 2024-02-07 ENCOUNTER — Other Ambulatory Visit: Payer: Self-pay

## 2024-02-07 VITALS — BP 100/69 | HR 66 | Temp 97.4°F | Ht 65.0 in | Wt 192.2 lb

## 2024-02-07 DIAGNOSIS — Z049 Encounter for examination and observation for unspecified reason: Secondary | ICD-10-CM

## 2024-02-07 DIAGNOSIS — I671 Cerebral aneurysm, nonruptured: Secondary | ICD-10-CM

## 2024-02-07 NOTE — Progress Notes (Signed)
 Chief Complaint: Patient was seen in consultation today for  Chief Complaint  Patient presents with   New Patient (Initial Visit)     Cerebral aneurysm without rupture    at the request of Kim [name]  Referring Physician(s): Kim [name]  [facility] [name] is a 53 y.o. female seen for evaluation of brain aneurysms  Assessment and Plan Assessment & Plan Stable multiple small intracranial aneurysms Largest is 5 mm from right internal carotid artery and this is cavernous. Others are smaller. Not causing headaches. Rupture risk ~1% over 10 years. Surgical risk higher with complications of surgery around 2 to 3% - Advised against surgery due to favorable natural history - Scheduled follow-up imaging with [facility] in three years to monitor aneurysms.     Discussed the use of AI scribe software for clinical note transcription with the patient, who gave verbal consent to proceed.  History of Present Illness Kim [name] Kim [name] is a 53 year old female with stable cerebral aneurysms who presents for follow-up evaluation.  She has a history of multiple cerebral aneurysms that have been stable for five years. A CT arteriogram from January 21, 2024, showed a 5 mm cavernous aneurysm projecting medially from the right internal carotid artery, a 3 mm aneurysm from the medial supraclinoid internal carotid artery, and a less than 2 mm aneurysm projecting laterally from the supraclinoid internal carotid artery on the left side. Additionally, there is a 2 mm aneurysm just above the origin of the left ophthalmic artery. An MR angiogram from May 21, 2018, confirmed these same findings with no interval change.  She experiences headaches almost daily, which are minimal and occur upon waking. These headaches have been present for about five to six years, coinciding with the discovery of her aneurysms. No other neurological symptoms aside from headaches.  She is concerned about her headaches due to  recent events in her community, where individuals have suffered severe outcomes from headaches, including a young woman who died from a hemorrhage.  The young woman was not genetically related to the patient.    Medications: Prior to Admission medications   Medication Sig Start Date End Date Taking? Authorizing Provider  aluminum -magnesium  hydroxide-simethicone  (MAALOX) 200-200-20 MG/5ML SUSP Take 15 mLs by mouth 4 (four) times daily -  before meals and at bedtime. 04/13/22  Yes Crain, Whitney L, PA  amitriptyline  (ELAVIL ) 10 MG tablet TAKE 1 TABLET BY MOUTH EVERYDAY AT BEDTIME 09/06/23  Yes Nooruddin, Saad, MD  clobetasol  ointment (TEMOVATE ) 0.05 % Apply 1 Application topically daily. 11/02/23  Yes Hines, Genesis V, MD  cyclobenzaprine  (FLEXERIL ) 5 MG tablet Take 1 tablet (5 mg total) by mouth at bedtime as needed. 01/18/24  Yes Christopher Savannah, PA-C  diclofenac  Sodium (VOLTAREN  ARTHRITIS PAIN) 1 % GEL Apply 4 g topically 4 (four) times daily as needed (for pain). 12/19/23  Yes Juberg, Lonni, DO  dicyclomine  (BENTYL ) 20 MG tablet TAKE 1 TABLET (20 MG TOTAL) BY MOUTH 3 (THREE) TIMES DAILY AS NEEDED FOR SPASMS. 08/21/23  Yes Craig Palma R, PA-C  esomeprazole  (NEXIUM ) 20 MG capsule Take 1 capsule (20 mg total) by mouth daily. 04/13/22  Yes Crain, Whitney L, PA  estradiol (ESTRACE) 0.01 % CREA vaginal cream Apply 0.5g to vulva 2 times a week at night. 02/05/24  Yes Hines, Genesis V, MD  famotidine  (PEPCID ) 40 MG tablet TAKE 1 TABLET BY MOUTH EVERYDAY AT BEDTIME 08/31/23  Yes Craig Palma SAUNDERS, PA-C  meloxicam  (MOBIC ) 15 MG tablet Take 1 tablet (  15 mg total) by mouth daily. 01/22/24  Yes McCaughan, Dia D, DPM     Review of Systems  Vital Signs:  BP 100/69   Pulse 66   Temp (!) 97.4 F (36.3 C) (Oral)   Ht 5' 5 (1.651 m)   Wt 192 lb 3.2 oz (87.2 kg)   LMP 11/23/2020   SpO2 98%   BMI 31.98 kg/m   Physical Exam   Imaging:  Results RADIOLOGY CT angiogram: 5 mm cavernous aneurysm  projecting medially from the right internal carotid artery, 3 mm aneurysm projecting from the medial supraclinoid internal carotid artery, <2 mm aneurysm projecting laterally from the supraclinoid internal carotid artery on the left side, 2 mm aneurysm above the origin of the ophthalmic artery (01/21/2024) MR angiogram: No interval change in aneurysms, stable for five years (05/21/2018)  Electronically Signed: Nancyann LULLA Burns  02/07/2024, 3:53 PM  I spent 35 minutes with review of the past history, review of the imaging studies, and consultation with the patient.

## 2024-02-08 ENCOUNTER — Telehealth: Payer: Self-pay | Admitting: Neurology

## 2024-02-08 ENCOUNTER — Other Ambulatory Visit: Payer: Self-pay

## 2024-02-08 ENCOUNTER — Ambulatory Visit: Attending: Internal Medicine

## 2024-02-08 DIAGNOSIS — G8929 Other chronic pain: Secondary | ICD-10-CM | POA: Diagnosis not present

## 2024-02-08 DIAGNOSIS — M25512 Pain in left shoulder: Secondary | ICD-10-CM | POA: Diagnosis not present

## 2024-02-08 NOTE — Telephone Encounter (Signed)
 [id number] [address] [place], [place] - [address] [place] [address]   10mg  Amitripyline

## 2024-02-08 NOTE — Therapy (Signed)
 OUTPATIENT PHYSICAL THERAPY SHOULDER EVALUATION   Patient Name: Kim [name] MRN: [id number] DOB:16-Jan-1971, 53 y.o., female Today's Date: 02/08/2024  END OF SESSION:   Past Medical History:  Diagnosis Date   Abnormal Pap smear    patient states she had cancer that was removed from her cervix   Asthma    when pregnant/ once 6 yrs ago only time asthma attack the dr said   Cancer [facility])    Clotting disorder    Constipation    GERD (gastroesophageal reflux disease) 10/19/2015   Gestational diabetes    H. pylori infection    Helicobacter pylori (H. pylori) infection 09/13/2023   Hemorrhoids    History of gestational diabetes    Hx of pulmonary embolus during pregnancy 11/09/2014   July 2016: CTA notable for pulmonary embolus to the posterior right  lower lobe. Provoked given pregnancy.     Coumadin  until 02/09/15.      Intramural leiomyoma of uterus 02/29/2016   Pulmonary embolism (HCC)    Renal insufficiency    TMJ (sprain of temporomandibular joint), initial encounter 12/29/2020   Vitamin D  deficiency    Vitamin D  deficiency    Past Surgical History:  Procedure Laterality Date   CHOLECYSTECTOMY     Patient Active Problem List   Diagnosis Date Noted   Neuropathy of right lateral plantar nerve 01/03/2024   Plantar fasciitis 12/05/2023   Well woman exam with routine gynecological exam 11/02/2023   Polyarthralgia 08/08/2023   Anxiety disorder due to general medical condition with panic attack 01/31/2020   Cerebral aneurysm without rupture 01/31/2020   Intramural leiomyoma of uterus 02/29/2016   History of pulmonary embolism 02/24/2015    PCP: [name], MD  REFERRING PROVIDER:  Valdemar Rocky SAUNDERS, NP     REFERRING DIAG: Chronic left shoulder pain [M25.512, G89.29]   THERAPY DIAG:  Left shoulder pain, unspecified chronicity  Rationale for Evaluation and Treatment: Rehabilitation  ONSET DATE: 1 month  SUBJECTIVE:                                                                                                                                                                                       SUBJECTIVE STATEMENT: In-person interpreter was present throughout today's evaluation.   Patient reports that her L shoulder began with a mild pain for a few days. Then, one day it hurt so bad that I couldn't really move it. The day before she was lifting some cases of water. She states that it has been about 3 weeks to 1 months.    Hand dominance: Right  PERTINENT HISTORY: Relevant PMHx includes Pulmonary embolism, Renal insufficiency, Polyarthralgia, cerebral  aneurysm   PAIN:  Are you having pain?  Yes: NPRS scale: 3/10 current, 10/10 worst  Pain location: L shoulder (anterior)  Pain description: popping/clicking,  Aggravating factors: Upper body dressing, lifting heavy things, sleeping on L side Relieving factors: I don't know, also taking pain medicine -- unsure if it's helping   PRECAUTIONS: None  RED FLAGS: None   WEIGHT BEARING RESTRICTIONS: No  FALLS:  Has patient fallen in last 6 months? No  However, she did have a fall about 1 year ago, when she hit R side of head, but doesn't remember the details of how she fell  LIVING ENVIRONMENT: Lives with: lives with their family and lives with their spouse Lives in: House/apartment  OCCUPATION: [name]   PLOF: Independent  PATIENT GOALS:to have less pain, to be able to move my left arm normally   NEXT MD VISIT: 02/09/2024 Neurology, 03/05/2024 PCP  OBJECTIVE:  Note: Objective measures were completed at Evaluation unless otherwise noted.  DIAGNOSTIC FINDINGS:  01/18/2024: IMPRESSION: Mild elevation of the LEFT clavicle relative to the acromion is suspicious for [facility] joint separation of uncertain chronicity.  PATIENT SURVEYS:  Quick Dash:  QUICK DASH  Please rate your ability do the following activities in the last week by selecting the number below the appropriate  response.   Activities Rating  Open a tight or new jar.  4 = Severe difficulty  Do heavy household chores (e.g., wash walls, floors). 4 = Severe difficulty  Carry a shopping bag or briefcase 5 = Unable  Wash your back. 4 = Severe difficulty  Use a knife to cut food. 4 = Severe difficulty  Recreational activities in which you take some force or impact through your arm, shoulder or hand (e.g., golf, hammering, tennis, etc.). 5 = Unable  During the past week, to what extent has your arm, shoulder or hand problem interfered with your normal social activities with family, friends, neighbors or groups?  3 = Moderately  During the past week, were you limited in your work or other regular daily activities as a result of your arm, shoulder or hand problem? 3 = Moderately limited  Rate the severity of the following symptoms in the last week: Arm, Shoulder, or hand pain. 3 = Moderate  Rate the severity of the following symptoms in the last week: Tingling (pins and needles) in your arm, shoulder or hand. 2 = Mild  During the past week, how much difficulty have you had sleeping because of the pain in your arm, shoulder or hand?  4 = Severe difficulty   (A QuickDASH score may not be calculated if there is greater than 1 missing item.)  Quick Dash Disability/Symptom Score: 68.2  Minimally Clinically Important Difference (MCID): 15-20 points  (Franchignoni, F. et al. (2013). Minimally clinically important difference of the disabilities of the arm, shoulder, and hand outcome measures (DASH) and its shortened version (Quick DASH). Journal of Orthopaedic & Sports Physical Therapy, 44(1), 30-39)   COGNITION: Overall cognitive status: Within functional limits for tasks assessed     SENSATION: Not tested   UPPER EXTREMITY ROM:   Active ROM Right eval Left eval  Shoulder flexion  146  Shoulder extension    Shoulder abduction  90  Shoulder adduction    Shoulder internal rotation  [facility]  Shoulder  external rotation  [facility]   Elbow flexion    Elbow extension    Wrist flexion    Wrist extension    Wrist ulnar deviation  Wrist radial deviation    Wrist pronation    Wrist supination    (Blank rows = not tested)  UPPER EXTREMITY MMT:  MMT Right eval Left eval  Shoulder flexion 5 3+  Shoulder extension    Shoulder abduction 5 3+  Shoulder adduction    Shoulder internal rotation  4-  Shoulder external rotation  3+  Middle trapezius    Lower trapezius    Elbow flexion  4  Elbow extension  4  Wrist flexion    Wrist extension    Wrist ulnar deviation    Wrist radial deviation    Wrist pronation    Wrist supination    Grip strength (lbs)    (Blank rows = not tested)                                                                                                                               TREATMENT DATE:   [facility] Adult PT Treatment:                                                DATE: 02/08/2024   Initial evaluation: see patient education and home exercise program as noted below     PATIENT EDUCATION: Education details: reviewed initial home exercise program; discussion of POC, prognosis and goals for skilled PT   Person educated: Patient Education method: Explanation, Demonstration, and Handouts Education comprehension: verbalized understanding, returned demonstration, and needs further education  HOME EXERCISE PROGRAM: Access Code: [email or web address] URL: [email or web address] Date: 02/08/2024 Prepared by: Marko Molt  Exercises - Seated Bilateral Shoulder Flexion Towel Slide at Table Top  - 2 x daily - 2 sets - 10 reps - 3 sec hold - Seated Shoulder Abduction Towel Slide at Table Top  - 2 x daily - 2 sets - 10 reps - 3 sec hold - Isometric Shoulder Flexion at Wall  - 2 x daily - 2 sets - 10 reps - 5 sec hold - Isometric Shoulder Abduction at Wall  - 2 x daily - 2 sets - 10 reps - 5 sec hold - Isometric Shoulder Extension at Wall  - 2 x daily - 2  sets - 10 reps - 5 sec hold - Isometric Shoulder Adduction  - 2 x daily - 2 sets - 10 reps - 5 sec hold  ASSESSMENT:  CLINICAL IMPRESSION: Kim [name] is a 53 y.o. female who was seen today for physical therapy evaluation and treatment for L shoulder pain with mobility deficits, with related AC separation. She is demonstrating diminished L UE MMT scores, and pain with overhead reaching. She has related pain and difficulty with upper body dressing, heavy lifting, and diminished sleep quality d/t pain when laying on L side.. She requires skilled PT services at this time to address relevant deficits and improve overall function.  OBJECTIVE IMPAIRMENTS: decreased activity tolerance, decreased knowledge of condition, decreased ROM, decreased strength, impaired UE functional use, and pain.   ACTIVITY LIMITATIONS: carrying, lifting, sleeping, bathing, dressing, reach over head, and hygiene/grooming  PARTICIPATION LIMITATIONS: meal prep, cleaning, laundry, interpersonal relationship, driving, shopping, and community activity  PERSONAL FACTORS: 3+ comorbidities: Relevant PMHx includes Pulmonary embolism, Renal insufficiency, Polyarthralgia, cerebral aneurysm  are also affecting patient's functional outcome.   REHAB POTENTIAL: Good  CLINICAL DECISION MAKING: Stable/uncomplicated  EVALUATION COMPLEXITY: Low   GOALS: Goals reviewed with patient? YES  SHORT TERM GOALS: Target date: 02/29/2024    Patient will be independent with initial home program at least 3 days/week.  Baseline: provided at eval Goal Status: INITIAL    2.  Patient will demonstrate 10-15 degree improvement in L shoulder elevation. Baseline: 146deg shoulder flexion, 90deg shoulder abduction  Goal status: INITIAL   LONG TERM GOALS: Target date: 03/21/2024    Patient will report improved overall functional ability with QuickDASH score of 30 or less.  Baseline: 68.2 Goal Status: INITIAL    2.  Patient will  demonstrate improved L shoulder MMT scores of 4+/5 or greater.  Baseline: see objective measures  Goal status: INITIAL  3.  Patient will report ability to perform all ADLs, including Upper Body Dressing without pain or difficulty  Baseline: moderately limited by pain and ROM deficits Goal status: INITIAL  4.  Patient will demonstrate ability to perform floor to waist lifting of at least 25# using appropriate body mechanics and with no more than minimal pain in order to safely perform normal daily/occupational tasks.   Goal Status: INITIAL   5.  Patient will demonstrate ability to perform overhead lifting of at least 5# using appropriate body mechanics and with no more than minimal pain in order to safely perform normal daily/occupational tasks.   Goal Status: INITIAL     PLAN:  PT FREQUENCY: 1-2x/week  PT DURATION: 6 weeks  PLANNED INTERVENTIONS: 97164- PT Re-evaluation, 97110-Therapeutic exercises, 97530- Therapeutic activity, 97112- Neuromuscular re-education, 97535- Self Care, [id number]- Manual therapy, [id number]- Electrical stimulation (unattended), 20560 (1-2 muscles), 20561 (3+ muscles)- Dry Needling, Patient/Family education, Taping, Joint mobilization, Spinal mobilization, Cryotherapy, and Moist heat  PLAN FOR NEXT SESSION: review and updated HEP as indicated, address L shoulder/periscapular stabilization, patient education regarding management of symptoms, otherwise progress towards established rehab goals as tolerated   Marko Molt, PT, DPT  02/11/2024 8:40 PM    [employer] Authorization   Choose one: Rehabilitative  Standardized Assessment or Functional Outcome Tool: See Pain Assessment and Quick DASH  Score or Percent Disability: 68% perceived disability  Body Parts Treated (Select each separately):  Shoulder. Overall deficits/functional limitations for body part selected: moderate

## 2024-02-09 ENCOUNTER — Other Ambulatory Visit: Payer: Self-pay | Admitting: Neurology

## 2024-02-09 MED ORDER — AMITRIPTYLINE HCL 10 MG PO TABS: 10.0000 mg | ORAL_TABLET | Freq: Every day | ORAL | 1 refills | Status: AC

## 2024-02-19 ENCOUNTER — Ambulatory Visit

## 2024-02-21 ENCOUNTER — Ambulatory Visit: Attending: Internal Medicine

## 2024-02-21 DIAGNOSIS — G8929 Other chronic pain: Secondary | ICD-10-CM | POA: Insufficient documentation

## 2024-02-21 DIAGNOSIS — M6281 Muscle weakness (generalized): Secondary | ICD-10-CM | POA: Insufficient documentation

## 2024-02-21 DIAGNOSIS — M25561 Pain in right knee: Secondary | ICD-10-CM | POA: Insufficient documentation

## 2024-02-21 DIAGNOSIS — M25512 Pain in left shoulder: Secondary | ICD-10-CM | POA: Insufficient documentation

## 2024-02-21 NOTE — Therapy (Incomplete)
 OUTPATIENT PHYSICAL THERAPY TREATMENT NOTE   Patient Name: Kim [name] MRN: [id number] DOB:1970/09/12, 53 y.o., female Today's Date: 02/21/2024  END OF SESSION:   Past Medical History:  Diagnosis Date   Abnormal Pap smear    patient states she had cancer that was removed from her cervix   Asthma    when pregnant/ once 6 yrs ago only time asthma attack the dr said   Cancer [facility])    Clotting disorder    Constipation    GERD (gastroesophageal reflux disease) 10/19/2015   Gestational diabetes    H. pylori infection    Helicobacter pylori (H. pylori) infection 09/13/2023   Hemorrhoids    History of gestational diabetes    Hx of pulmonary embolus during pregnancy 11/09/2014   July 2016: CTA notable for pulmonary embolus to the posterior right  lower lobe. Provoked given pregnancy.     Coumadin  until 02/09/15.      Intramural leiomyoma of uterus 02/29/2016   Pulmonary embolism (HCC)    Renal insufficiency    TMJ (sprain of temporomandibular joint), initial encounter 12/29/2020   Vitamin D  deficiency    Vitamin D  deficiency    Past Surgical History:  Procedure Laterality Date   CHOLECYSTECTOMY     Patient Active Problem List   Diagnosis Date Noted   Neuropathy of right lateral plantar nerve 01/03/2024   Plantar fasciitis 12/05/2023   Well woman exam with routine gynecological exam 11/02/2023   Polyarthralgia 08/08/2023   Anxiety disorder due to general medical condition with panic attack 01/31/2020   Cerebral aneurysm without rupture 01/31/2020   Intramural leiomyoma of uterus 02/29/2016   History of pulmonary embolism 02/24/2015    PCP: [name], MD  REFERRING PROVIDER:  Valdemar Rocky SAUNDERS, NP     REFERRING DIAG: Chronic left shoulder pain [M25.512, G89.29]   THERAPY DIAG:  No diagnosis found.  Rationale for Evaluation and Treatment: Rehabilitation  ONSET DATE: 1 month  SUBJECTIVE:                                                                                                                                                                                       SUBJECTIVE STATEMENT: In-person interpreter was present throughout today's evaluation.   ***  Patient reports that her L shoulder began with a mild pain for a few days. Then, one day it hurt so bad that I couldn't really move it. The day before she was lifting some cases of water. She states that it has been about 3 weeks to 1 months.    Hand dominance: Right  PERTINENT HISTORY: Relevant PMHx includes Pulmonary embolism, Renal insufficiency, Polyarthralgia, cerebral  aneurysm   PAIN:  Are you having pain?  Yes: NPRS scale: 3/10 current, 10/10 worst  Pain location: L shoulder (anterior)  Pain description: popping/clicking,  Aggravating factors: Upper body dressing, lifting heavy things, sleeping on L side Relieving factors: I don't know, also taking pain medicine -- unsure if it's helping   PRECAUTIONS: None  RED FLAGS: None   WEIGHT BEARING RESTRICTIONS: No  FALLS:  Has patient fallen in last 6 months? No  However, she did have a fall about 1 year ago, when she hit R side of head, but doesn't remember the details of how she fell  LIVING ENVIRONMENT: Lives with: lives with their family and lives with their spouse Lives in: House/apartment  OCCUPATION: [name]   PLOF: Independent  PATIENT GOALS:to have less pain, to be able to move my left arm normally   NEXT MD VISIT: 02/09/2024 Neurology, 03/05/2024 PCP  OBJECTIVE:  Note: Objective measures were completed at Evaluation unless otherwise noted.  DIAGNOSTIC FINDINGS:  01/18/2024: IMPRESSION: Mild elevation of the LEFT clavicle relative to the acromion is suspicious for [facility] joint separation of uncertain chronicity.  PATIENT SURVEYS:  Quick Dash:  QUICK DASH  Please rate your ability do the following activities in the last week by selecting the number below the appropriate response.   Activities  Rating  Open a tight or new jar.  4 = Severe difficulty  Do heavy household chores (e.g., wash walls, floors). 4 = Severe difficulty  Carry a shopping bag or briefcase 5 = Unable  Wash your back. 4 = Severe difficulty  Use a knife to cut food. 4 = Severe difficulty  Recreational activities in which you take some force or impact through your arm, shoulder or hand (e.g., golf, hammering, tennis, etc.). 5 = Unable  During the past week, to what extent has your arm, shoulder or hand problem interfered with your normal social activities with family, friends, neighbors or groups?  3 = Moderately  During the past week, were you limited in your work or other regular daily activities as a result of your arm, shoulder or hand problem? 3 = Moderately limited  Rate the severity of the following symptoms in the last week: Arm, Shoulder, or hand pain. 3 = Moderate  Rate the severity of the following symptoms in the last week: Tingling (pins and needles) in your arm, shoulder or hand. 2 = Mild  During the past week, how much difficulty have you had sleeping because of the pain in your arm, shoulder or hand?  4 = Severe difficulty   (A QuickDASH score may not be calculated if there is greater than 1 missing item.)  Quick Dash Disability/Symptom Score: 68.2  Minimally Clinically Important Difference (MCID): 15-20 points  (Franchignoni, F. et al. (2013). Minimally clinically important difference of the disabilities of the arm, shoulder, and hand outcome measures (DASH) and its shortened version (Quick DASH). Journal of Orthopaedic & Sports Physical Therapy, 44(1), 30-39)   COGNITION: Overall cognitive status: Within functional limits for tasks assessed     SENSATION: Not tested   UPPER EXTREMITY ROM:   Active ROM Right eval Left eval  Shoulder flexion  146  Shoulder extension    Shoulder abduction  90  Shoulder adduction    Shoulder internal rotation  [facility]  Shoulder external rotation  [facility]   Elbow  flexion    Elbow extension    Wrist flexion    Wrist extension    Wrist ulnar deviation  Wrist radial deviation    Wrist pronation    Wrist supination    (Blank rows = not tested)  UPPER EXTREMITY MMT:  MMT Right eval Left eval  Shoulder flexion 5 3+  Shoulder extension    Shoulder abduction 5 3+  Shoulder adduction    Shoulder internal rotation  4-  Shoulder external rotation  3+  Middle trapezius    Lower trapezius    Elbow flexion  4  Elbow extension  4  Wrist flexion    Wrist extension    Wrist ulnar deviation    Wrist radial deviation    Wrist pronation    Wrist supination    Grip strength (lbs)    (Blank rows = not tested)                                                                                                                               TREATMENT DATE:  [facility] Adult PT Treatment:                                                DATE: 02/21/24 Therapeutic Exercise: Pulleys flexion/scaption x2' ea Supine chest press with dowel Supine shoulder flexion with dowel Supine shoulder flexion AROM 500g ball? Supine horizontal abduction Manual Therapy: *** Neuromuscular re-ed: Rows Shoulder extension ER/IR walkouts Supine rhythmic stabilization Seated BIL ER with scap retraction Therapeutic Activity: *** Modalities: *** Self Care: ***  [facility] Adult PT Treatment:                                                DATE: 02/08/2024   Initial evaluation: see patient education and home exercise program as noted below     PATIENT EDUCATION: Education details: reviewed initial home exercise program; discussion of POC, prognosis and goals for skilled PT   Person educated: Patient Education method: Explanation, Demonstration, and Handouts Education comprehension: verbalized understanding, returned demonstration, and needs further education  HOME EXERCISE PROGRAM: Access Code: [email or web address] URL: [email or web address] Date: 02/08/2024 Prepared  by: Marko Molt  Exercises - Seated Bilateral Shoulder Flexion Towel Slide at Table Top  - 2 x daily - 2 sets - 10 reps - 3 sec hold - Seated Shoulder Abduction Towel Slide at Table Top  - 2 x daily - 2 sets - 10 reps - 3 sec hold - Isometric Shoulder Flexion at Wall  - 2 x daily - 2 sets - 10 reps - 5 sec hold - Isometric Shoulder Abduction at Wall  - 2 x daily - 2 sets - 10 reps - 5 sec hold - Isometric Shoulder Extension at Wall  - 2 x daily - 2 sets - 10 reps - 5  sec hold - Isometric Shoulder Adduction  - 2 x daily - 2 sets - 10 reps - 5 sec hold  ASSESSMENT:  CLINICAL IMPRESSION: ***  EVAL: Kim [name] is a 53 y.o. female who was seen today for physical therapy evaluation and treatment for L shoulder pain with mobility deficits, with related AC separation. She is demonstrating diminished L UE MMT scores, and pain with overhead reaching. She has related pain and difficulty with upper body dressing, heavy lifting, and diminished sleep quality d/t pain when laying on L side.. She requires skilled PT services at this time to address relevant deficits and improve overall function.     OBJECTIVE IMPAIRMENTS: decreased activity tolerance, decreased knowledge of condition, decreased ROM, decreased strength, impaired UE functional use, and pain.   ACTIVITY LIMITATIONS: carrying, lifting, sleeping, bathing, dressing, reach over head, and hygiene/grooming  PARTICIPATION LIMITATIONS: meal prep, cleaning, laundry, interpersonal relationship, driving, shopping, and community activity  PERSONAL FACTORS: 3+ comorbidities: Relevant PMHx includes Pulmonary embolism, Renal insufficiency, Polyarthralgia, cerebral aneurysm  are also affecting patient's functional outcome.   REHAB POTENTIAL: Good  CLINICAL DECISION MAKING: Stable/uncomplicated  EVALUATION COMPLEXITY: Low   GOALS: Goals reviewed with patient? YES  SHORT TERM GOALS: Target date: 02/29/2024    Patient will be independent with  initial home program at least 3 days/week.  Baseline: provided at eval Goal Status: INITIAL    2.  Patient will demonstrate 10-15 degree improvement in L shoulder elevation. Baseline: 146deg shoulder flexion, 90deg shoulder abduction  Goal status: INITIAL   LONG TERM GOALS: Target date: 03/21/2024    Patient will report improved overall functional ability with QuickDASH score of 30 or less.  Baseline: 68.2 Goal Status: INITIAL    2.  Patient will demonstrate improved L shoulder MMT scores of 4+/5 or greater.  Baseline: see objective measures  Goal status: INITIAL  3.  Patient will report ability to perform all ADLs, including Upper Body Dressing without pain or difficulty  Baseline: moderately limited by pain and ROM deficits Goal status: INITIAL  4.  Patient will demonstrate ability to perform floor to waist lifting of at least 25# using appropriate body mechanics and with no more than minimal pain in order to safely perform normal daily/occupational tasks.   Goal Status: INITIAL   5.  Patient will demonstrate ability to perform overhead lifting of at least 5# using appropriate body mechanics and with no more than minimal pain in order to safely perform normal daily/occupational tasks.   Goal Status: INITIAL     PLAN:  PT FREQUENCY: 1-2x/week  PT DURATION: 6 weeks  PLANNED INTERVENTIONS: 97164- PT Re-evaluation, 97110-Therapeutic exercises, 97530- Therapeutic activity, 97112- Neuromuscular re-education, 97535- Self Care, 02859- Manual therapy, G0283- Electrical stimulation (unattended), 20560 (1-2 muscles), 20561 (3+ muscles)- Dry Needling, Patient/Family education, Taping, Joint mobilization, Spinal mobilization, Cryotherapy, and Moist heat  PLAN FOR NEXT SESSION: review and updated HEP as indicated, address L shoulder/periscapular stabilization, patient education regarding management of symptoms, otherwise progress towards established rehab goals as  tolerated   Kim [name] PTA  02/21/2024 8:09 AM

## 2024-02-22 ENCOUNTER — Ambulatory Visit

## 2024-02-22 NOTE — Therapy (Incomplete)
 OUTPATIENT PHYSICAL THERAPY TREATMENT NOTE   Patient Name: Kim [name] Kim [name] MRN: [id number] DOB:03/13/1971, 53 y.o., female Today's Date: 02/22/2024  END OF SESSION:   Past Medical History:  Diagnosis Date   Abnormal Pap smear    patient states she had cancer that was removed from her cervix   Asthma    when pregnant/ once 6 yrs ago only time asthma attack the dr said   Cancer [facility])    Clotting disorder    Constipation    GERD (gastroesophageal reflux disease) 10/19/2015   Gestational diabetes    H. pylori infection    Helicobacter pylori (H. pylori) infection 09/13/2023   Hemorrhoids    History of gestational diabetes    Hx of pulmonary embolus during pregnancy 11/09/2014   July 2016: CTA notable for pulmonary embolus to the posterior right  lower lobe. Provoked given pregnancy.     Coumadin  until 02/09/15.      Intramural leiomyoma of uterus 02/29/2016   Pulmonary embolism (HCC)    Renal insufficiency    TMJ (sprain of temporomandibular joint), initial encounter 12/29/2020   Vitamin D  deficiency    Vitamin D  deficiency    Past Surgical History:  Procedure Laterality Date   CHOLECYSTECTOMY     Patient Active Problem List   Diagnosis Date Noted   Neuropathy of right lateral plantar nerve 01/03/2024   Plantar fasciitis 12/05/2023   Well woman exam with routine gynecological exam 11/02/2023   Polyarthralgia 08/08/2023   Anxiety disorder due to general medical condition with panic attack 01/31/2020   Cerebral aneurysm without rupture 01/31/2020   Intramural leiomyoma of uterus 02/29/2016   History of pulmonary embolism 02/24/2015    PCP: [name], MD  REFERRING PROVIDER:  Valdemar Rocky SAUNDERS, NP     REFERRING DIAG: Chronic left shoulder pain [M25.512, G89.29]   THERAPY DIAG:  No diagnosis found.  Rationale for Evaluation and Treatment: Rehabilitation  ONSET DATE: 1 month  SUBJECTIVE:                                                                                                                                                                                       SUBJECTIVE STATEMENT: In-person interpreter was present throughout today's evaluation.   ***  Patient reports that her L shoulder began with a mild pain for a few days. Then, one day it hurt so bad that I couldn't really move it. The day before she was lifting some cases of water. She states that it has been about 3 weeks to 1 months.    Hand dominance: Right  PERTINENT HISTORY: Relevant PMHx includes Pulmonary embolism, Renal insufficiency, Polyarthralgia, cerebral  aneurysm   PAIN:  Are you having pain?  Yes: NPRS scale: 3/10 current, 10/10 worst  Pain location: L shoulder (anterior)  Pain description: popping/clicking,  Aggravating factors: Upper body dressing, lifting heavy things, sleeping on L side Relieving factors: I don't know, also taking pain medicine -- unsure if it's helping   PRECAUTIONS: None  RED FLAGS: None   WEIGHT BEARING RESTRICTIONS: No  FALLS:  Has patient fallen in last 6 months? No  However, she did have a fall about 1 year ago, when she hit R side of head, but doesn't remember the details of how she fell  LIVING ENVIRONMENT: Lives with: lives with their family and lives with their spouse Lives in: House/apartment  OCCUPATION: [name]   PLOF: Independent  PATIENT GOALS:to have less pain, to be able to move my left arm normally   NEXT MD VISIT: 02/09/2024 Neurology, 03/05/2024 PCP  OBJECTIVE:  Note: Objective measures were completed at Evaluation unless otherwise noted.  DIAGNOSTIC FINDINGS:  01/18/2024: IMPRESSION: Mild elevation of the LEFT clavicle relative to the acromion is suspicious for [facility] joint separation of uncertain chronicity.  PATIENT SURVEYS:  Quick Dash:  QUICK DASH  Please rate your ability do the following activities in the last week by selecting the number below the appropriate response.   Activities  Rating  Open a tight or new jar.  4 = Severe difficulty  Do heavy household chores (e.g., wash walls, floors). 4 = Severe difficulty  Carry a shopping bag or briefcase 5 = Unable  Wash your back. 4 = Severe difficulty  Use a knife to cut food. 4 = Severe difficulty  Recreational activities in which you take some force or impact through your arm, shoulder or hand (e.g., golf, hammering, tennis, etc.). 5 = Unable  During the past week, to what extent has your arm, shoulder or hand problem interfered with your normal social activities with family, friends, neighbors or groups?  3 = Moderately  During the past week, were you limited in your work or other regular daily activities as a result of your arm, shoulder or hand problem? 3 = Moderately limited  Rate the severity of the following symptoms in the last week: Arm, Shoulder, or hand pain. 3 = Moderate  Rate the severity of the following symptoms in the last week: Tingling (pins and needles) in your arm, shoulder or hand. 2 = Mild  During the past week, how much difficulty have you had sleeping because of the pain in your arm, shoulder or hand?  4 = Severe difficulty   (A QuickDASH score may not be calculated if there is greater than 1 missing item.)  Quick Dash Disability/Symptom Score: 68.2  Minimally Clinically Important Difference (MCID): 15-20 points  (Franchignoni, F. et al. (2013). Minimally clinically important difference of the disabilities of the arm, shoulder, and hand outcome measures (DASH) and its shortened version (Quick DASH). Journal of Orthopaedic & Sports Physical Therapy, 44(1), 30-39)   COGNITION: Overall cognitive status: Within functional limits for tasks assessed     SENSATION: Not tested   UPPER EXTREMITY ROM:   Active ROM Right eval Left eval  Shoulder flexion  146  Shoulder extension    Shoulder abduction  90  Shoulder adduction    Shoulder internal rotation  [facility]  Shoulder external rotation  [facility]   Elbow  flexion    Elbow extension    Wrist flexion    Wrist extension    Wrist ulnar deviation  Wrist radial deviation    Wrist pronation    Wrist supination    (Blank rows = not tested)  UPPER EXTREMITY MMT:  MMT Right eval Left eval  Shoulder flexion 5 3+  Shoulder extension    Shoulder abduction 5 3+  Shoulder adduction    Shoulder internal rotation  4-  Shoulder external rotation  3+  Middle trapezius    Lower trapezius    Elbow flexion  4  Elbow extension  4  Wrist flexion    Wrist extension    Wrist ulnar deviation    Wrist radial deviation    Wrist pronation    Wrist supination    Grip strength (lbs)    (Blank rows = not tested)                                                                                                                               TREATMENT DATE:  [facility] Adult PT Treatment:                                                DATE: 02/22/24 Therapeutic Exercise: Pulleys flexion/scaption x2' ea Supine chest press with dowel 2x10 Supine shoulder flexion with dowel 2x10 Supine shoulder flexion AROM 500g ball? Neuromuscular re-ed: Rows RTB 2x10 Shoulder extension RTB 2x10 ER/IR walkouts 2x10 ea Seated BIL ER with scap retraction RTB 2x10  [facility] Adult PT Treatment:                                                DATE: 02/08/2024   Initial evaluation: see patient education and home exercise program as noted below     PATIENT EDUCATION: Education details: reviewed initial home exercise program; discussion of POC, prognosis and goals for skilled PT   Person educated: Patient Education method: Explanation, Demonstration, and Handouts Education comprehension: verbalized understanding, returned demonstration, and needs further education  HOME EXERCISE PROGRAM: Access Code: [email or web address] URL: [email or web address] Date: 02/08/2024 Prepared by: Marko Molt  Exercises - Seated Bilateral Shoulder Flexion Towel Slide at Table Top  - 2 x  daily - 2 sets - 10 reps - 3 sec hold - Seated Shoulder Abduction Towel Slide at Table Top  - 2 x daily - 2 sets - 10 reps - 3 sec hold - Isometric Shoulder Flexion at Wall  - 2 x daily - 2 sets - 10 reps - 5 sec hold - Isometric Shoulder Abduction at Wall  - 2 x daily - 2 sets - 10 reps - 5 sec hold - Isometric Shoulder Extension at Wall  - 2 x daily - 2 sets - 10 reps - 5 sec hold - Isometric Shoulder Adduction  -  2 x daily - 2 sets - 10 reps - 5 sec hold  ASSESSMENT:  CLINICAL IMPRESSION: ***  EVAL: Kim [name] is a 53 y.o. female who was seen today for physical therapy evaluation and treatment for L shoulder pain with mobility deficits, with related AC separation. She is demonstrating diminished L UE MMT scores, and pain with overhead reaching. She has related pain and difficulty with upper body dressing, heavy lifting, and diminished sleep quality d/t pain when laying on L side.. She requires skilled PT services at this time to address relevant deficits and improve overall function.     OBJECTIVE IMPAIRMENTS: decreased activity tolerance, decreased knowledge of condition, decreased ROM, decreased strength, impaired UE functional use, and pain.   ACTIVITY LIMITATIONS: carrying, lifting, sleeping, bathing, dressing, reach over head, and hygiene/grooming  PARTICIPATION LIMITATIONS: meal prep, cleaning, laundry, interpersonal relationship, driving, shopping, and community activity  PERSONAL FACTORS: 3+ comorbidities: Relevant PMHx includes Pulmonary embolism, Renal insufficiency, Polyarthralgia, cerebral aneurysm  are also affecting patient's functional outcome.   REHAB POTENTIAL: Good  CLINICAL DECISION MAKING: Stable/uncomplicated  EVALUATION COMPLEXITY: Low   GOALS: Goals reviewed with patient? YES  SHORT TERM GOALS: Target date: 02/29/2024    Patient will be independent with initial home program at least 3 days/week.  Baseline: provided at eval Goal Status: INITIAL    2.   Patient will demonstrate 10-15 degree improvement in L shoulder elevation. Baseline: 146deg shoulder flexion, 90deg shoulder abduction  Goal status: INITIAL   LONG TERM GOALS: Target date: 03/21/2024    Patient will report improved overall functional ability with QuickDASH score of 30 or less.  Baseline: 68.2 Goal Status: INITIAL    2.  Patient will demonstrate improved L shoulder MMT scores of 4+/5 or greater.  Baseline: see objective measures  Goal status: INITIAL  3.  Patient will report ability to perform all ADLs, including Upper Body Dressing without pain or difficulty  Baseline: moderately limited by pain and ROM deficits Goal status: INITIAL  4.  Patient will demonstrate ability to perform floor to waist lifting of at least 25# using appropriate body mechanics and with no more than minimal pain in order to safely perform normal daily/occupational tasks.   Goal Status: INITIAL   5.  Patient will demonstrate ability to perform overhead lifting of at least 5# using appropriate body mechanics and with no more than minimal pain in order to safely perform normal daily/occupational tasks.   Goal Status: INITIAL     PLAN:  PT FREQUENCY: 1-2x/week  PT DURATION: 6 weeks  PLANNED INTERVENTIONS: 97164- PT Re-evaluation, 97110-Therapeutic exercises, 97530- Therapeutic activity, 97112- Neuromuscular re-education, 97535- Self Care, 02859- Manual therapy, G0283- Electrical stimulation (unattended), 20560 (1-2 muscles), 20561 (3+ muscles)- Dry Needling, Patient/Family education, Taping, Joint mobilization, Spinal mobilization, Cryotherapy, and Moist heat  PLAN FOR NEXT SESSION: review and updated HEP as indicated, address L shoulder/periscapular stabilization, patient education regarding management of symptoms, otherwise progress towards established rehab goals as tolerated   Shanda Code, SPTA  02/22/2024 8:14 AM

## 2024-02-26 ENCOUNTER — Ambulatory Visit

## 2024-02-28 ENCOUNTER — Ambulatory Visit

## 2024-03-04 ENCOUNTER — Ambulatory Visit

## 2024-03-05 ENCOUNTER — Ambulatory Visit: Admitting: Student

## 2024-03-05 ENCOUNTER — Ambulatory Visit

## 2024-03-05 DIAGNOSIS — M25512 Pain in left shoulder: Secondary | ICD-10-CM | POA: Diagnosis not present

## 2024-03-05 DIAGNOSIS — G8929 Other chronic pain: Secondary | ICD-10-CM | POA: Diagnosis not present

## 2024-03-05 DIAGNOSIS — M25561 Pain in right knee: Secondary | ICD-10-CM | POA: Diagnosis not present

## 2024-03-05 DIAGNOSIS — M6281 Muscle weakness (generalized): Secondary | ICD-10-CM | POA: Diagnosis not present

## 2024-03-05 NOTE — Therapy (Signed)
 OUTPATIENT PHYSICAL THERAPY TREATMENT NOTE   Patient Name: Kim [name] MRN: [id number] DOB:08/06/1970, 53 y.o., female Today's Date: 03/05/2024  END OF SESSION:  PT End of Session - 03/05/24 1138     Visit Number 2    Number of Visits 12    Date for Recertification  03/24/24    Authorization Type [employer]    Authorization Time Period 12 visits approved 02/08/24-06/10/24    Authorization - Visit Number 1    Authorization - Number of Visits 12    PT Start Time 1137    PT Stop Time 1215    PT Time Calculation (min) 38 min    Activity Tolerance Patient tolerated treatment well    Behavior During Therapy WFL for tasks assessed/performed          Past Medical History:  Diagnosis Date   Abnormal Pap smear    patient states she had cancer that was removed from her cervix   Asthma    when pregnant/ once 6 yrs ago only time asthma attack the dr said   Cancer [facility])    Clotting disorder    Constipation    GERD (gastroesophageal reflux disease) 10/19/2015   Gestational diabetes    H. pylori infection    Helicobacter pylori (H. pylori) infection 09/13/2023   Hemorrhoids    History of gestational diabetes    Hx of pulmonary embolus during pregnancy 11/09/2014   July 2016: CTA notable for pulmonary embolus to the posterior right  lower lobe. Provoked given pregnancy.     Coumadin  until 02/09/15.      Intramural leiomyoma of uterus 02/29/2016   Pulmonary embolism (HCC)    Renal insufficiency    TMJ (sprain of temporomandibular joint), initial encounter 12/29/2020   Vitamin D  deficiency    Vitamin D  deficiency    Past Surgical History:  Procedure Laterality Date   CHOLECYSTECTOMY     Patient Active Problem List   Diagnosis Date Noted   Neuropathy of right lateral plantar nerve 01/03/2024   Plantar fasciitis 12/05/2023   Well woman exam with routine gynecological exam 11/02/2023   Polyarthralgia 08/08/2023   Anxiety disorder due to general medical condition  with panic attack 01/31/2020   Cerebral aneurysm without rupture 01/31/2020   Intramural leiomyoma of uterus 02/29/2016   History of pulmonary embolism 02/24/2015    PCP: [name], MD  REFERRING PROVIDER:  Valdemar Rocky SAUNDERS, NP     REFERRING DIAG: Chronic left shoulder pain [M25.512, G89.29]   THERAPY DIAG:  Left shoulder pain, unspecified chronicity  Rationale for Evaluation and Treatment: Rehabilitation  ONSET DATE: 1 month  SUBJECTIVE:  SUBJECTIVE STATEMENT: In-person interpreter was present throughout today's evaluation.   Patient says her shoulder is feeling better, but moving into extension or carrying something heavy causes pain. She reports some relief with prescribed medications and ice.  EVAL: Patient reports that her L shoulder began with a mild pain for a few days. Then, one day it hurt so bad that I couldn't really move it. The day before she was lifting some cases of water. She states that it has been about 3 weeks to 1 months.    Hand dominance: Right  PERTINENT HISTORY: Relevant PMHx includes Pulmonary embolism, Renal insufficiency, Polyarthralgia, cerebral aneurysm   PAIN:  Are you having pain?  Yes: NPRS scale: 3/10 current, 10/10 worst  Pain location: L shoulder (anterior)  Pain description: popping/clicking,  Aggravating factors: Upper body dressing, lifting heavy things, sleeping on L side Relieving factors: I don't know, also taking pain medicine -- unsure if it's helping   PRECAUTIONS: None  RED FLAGS: None   WEIGHT BEARING RESTRICTIONS: No  FALLS:  Has patient fallen in last 6 months? No  However, she did have a fall about 1 year ago, when she hit R side of head, but doesn't remember the details of how she fell  LIVING ENVIRONMENT: Lives with: lives  with their family and lives with their spouse Lives in: House/apartment  OCCUPATION: [name]   PLOF: Independent  PATIENT GOALS:to have less pain, to be able to move my left arm normally   NEXT MD VISIT: 02/09/2024 Neurology, 03/05/2024 PCP  OBJECTIVE:  Note: Objective measures were completed at Evaluation unless otherwise noted.  DIAGNOSTIC FINDINGS:  01/18/2024: IMPRESSION: Mild elevation of the LEFT clavicle relative to the acromion is suspicious for [facility] joint separation of uncertain chronicity.  PATIENT SURVEYS:  Quick Dash:  QUICK DASH  Please rate your ability do the following activities in the last week by selecting the number below the appropriate response.   Activities Rating  Open a tight or new jar.  4 = Severe difficulty  Do heavy household chores (e.g., wash walls, floors). 4 = Severe difficulty  Carry a shopping bag or briefcase 5 = Unable  Wash your back. 4 = Severe difficulty  Use a knife to cut food. 4 = Severe difficulty  Recreational activities in which you take some force or impact through your arm, shoulder or hand (e.g., golf, hammering, tennis, etc.). 5 = Unable  During the past week, to what extent has your arm, shoulder or hand problem interfered with your normal social activities with family, friends, neighbors or groups?  3 = Moderately  During the past week, were you limited in your work or other regular daily activities as a result of your arm, shoulder or hand problem? 3 = Moderately limited  Rate the severity of the following symptoms in the last week: Arm, Shoulder, or hand pain. 3 = Moderate  Rate the severity of the following symptoms in the last week: Tingling (pins and needles) in your arm, shoulder or hand. 2 = Mild  During the past week, how much difficulty have you had sleeping because of the pain in your arm, shoulder or hand?  4 = Severe difficulty   (A QuickDASH score may not be calculated if there is greater than 1 missing item.)  Quick  Dash Disability/Symptom Score: 68.2  Minimally Clinically Important Difference (MCID): 15-20 points  (Franchignoni, F. et al. (2013). Minimally clinically important difference of the disabilities of the arm, shoulder, and hand outcome measures (DASH)  and its shortened version (Quick DASH). Journal of Orthopaedic & Sports Physical Therapy, 44(1), 30-39)   COGNITION: Overall cognitive status: Within functional limits for tasks assessed     SENSATION: Not tested   UPPER EXTREMITY ROM:   Active ROM Right eval Left eval  Shoulder flexion  146  Shoulder extension    Shoulder abduction  90  Shoulder adduction    Shoulder internal rotation  WFL  Shoulder external rotation  WFL   Elbow flexion    Elbow extension    Wrist flexion    Wrist extension    Wrist ulnar deviation    Wrist radial deviation    Wrist pronation    Wrist supination    (Blank rows = not tested)  UPPER EXTREMITY MMT:  MMT Right eval Left eval  Shoulder flexion 5 3+  Shoulder extension    Shoulder abduction 5 3+  Shoulder adduction    Shoulder internal rotation  4-  Shoulder external rotation  3+  Middle trapezius    Lower trapezius    Elbow flexion  4  Elbow extension  4  Wrist flexion    Wrist extension    Wrist ulnar deviation    Wrist radial deviation    Wrist pronation    Wrist supination    Grip strength (lbs)    (Blank rows = not tested)                                                                                                                              TREATMENT DATE:  [facility] Adult PT Treatment:                                                DATE: 03/05/24 Therapeutic Exercise: Pulleys flexion/scaption x2' ea Supine chest press with dowel 2x10 Supine shoulder flexion with dowel wide grip 2x10 Supine shoulder abduction AAROM with dowel 2x10 Elbow flex/ext 500g ball LUE 2x15 Seated abduction AAROM with dowel 2x10 Neuromuscular re-ed: Rows RTB 2x10 Shoulder extension RTB x4  (too painful) ER/IR walkouts x5 ea YTB LUE only Seated BIL ER with scap retraction RTB 2x10  [facility] Adult PT Treatment:                                                DATE: 02/08/2024   Initial evaluation: see patient education and home exercise program as noted below     PATIENT EDUCATION: Education details: reviewed initial home exercise program; discussion of POC, prognosis and goals for skilled PT   Person educated: Patient Education method: Explanation, Demonstration, and Handouts Education comprehension: verbalized understanding, returned demonstration, and needs further education  HOME EXERCISE PROGRAM: Access Code: [email or web address] URL:  [email or web address] Date: 02/08/2024 Prepared by: Marko Molt  Exercises - Seated Bilateral Shoulder Flexion Towel Slide at Table Top  - 2 x daily - 2 sets - 10 reps - 3 sec hold - Seated Shoulder Abduction Towel Slide at Table Top  - 2 x daily - 2 sets - 10 reps - 3 sec hold - Isometric Shoulder Flexion at Wall  - 2 x daily - 2 sets - 10 reps - 5 sec hold - Isometric Shoulder Abduction at Wall  - 2 x daily - 2 sets - 10 reps - 5 sec hold - Isometric Shoulder Extension at Wall  - 2 x daily - 2 sets - 10 reps - 5 sec hold - Isometric Shoulder Adduction  - 2 x daily - 2 sets - 10 reps - 5 sec hold  ASSESSMENT:  CLINICAL IMPRESSION: Patient presents to first follow up PT session reporting pain with certain motions, but that she is overall improving. She has been compliant with her HEP. She had increased pain in the anterior portion of her shoulder with extension with theraband, terminated this exercise early. Focused session on shoulder mobility and gentle periscapular and RTC strengthening. Patient continues to benefit from skilled PT services and should be progressed as able to improve functional independence.   EVAL: Kim [name] is a 53 y.o. female who was seen today for physical therapy evaluation and treatment for L shoulder pain  with mobility deficits, with related AC separation. She is demonstrating diminished L UE MMT scores, and pain with overhead reaching. She has related pain and difficulty with upper body dressing, heavy lifting, and diminished sleep quality d/t pain when laying on L side.. She requires skilled PT services at this time to address relevant deficits and improve overall function.     OBJECTIVE IMPAIRMENTS: decreased activity tolerance, decreased knowledge of condition, decreased ROM, decreased strength, impaired UE functional use, and pain.   ACTIVITY LIMITATIONS: carrying, lifting, sleeping, bathing, dressing, reach over head, and hygiene/grooming  PARTICIPATION LIMITATIONS: meal prep, cleaning, laundry, interpersonal relationship, driving, shopping, and community activity  PERSONAL FACTORS: 3+ comorbidities: Relevant PMHx includes Pulmonary embolism, Renal insufficiency, Polyarthralgia, cerebral aneurysm  are also affecting patient's functional outcome.   REHAB POTENTIAL: Good  CLINICAL DECISION MAKING: Stable/uncomplicated  EVALUATION COMPLEXITY: Low   GOALS: Goals reviewed with patient? YES  SHORT TERM GOALS: Target date: 02/29/2024   Patient will be independent with initial home program at least 3 days/week.  Baseline: provided at eval Goal Status: MET Pt reports adherence 03/05/24    2.  Patient will demonstrate 10-15 degree improvement in L shoulder elevation. Baseline: 146deg shoulder flexion, 90deg shoulder abduction  Goal status: Ongoing   LONG TERM GOALS: Target date: 03/21/2024    Patient will report improved overall functional ability with QuickDASH score of 30 or less.  Baseline: 68.2 Goal Status: INITIAL    2.  Patient will demonstrate improved L shoulder MMT scores of 4+/5 or greater.  Baseline: see objective measures  Goal status: INITIAL  3.  Patient will report ability to perform all ADLs, including Upper Body Dressing without pain or difficulty   Baseline: moderately limited by pain and ROM deficits Goal status: INITIAL  4.  Patient will demonstrate ability to perform floor to waist lifting of at least 25# using appropriate body mechanics and with no more than minimal pain in order to safely perform normal daily/occupational tasks.   Goal Status: INITIAL   5.  Patient will demonstrate ability to perform overhead lifting of at  least 5# using appropriate body mechanics and with no more than minimal pain in order to safely perform normal daily/occupational tasks.   Goal Status: INITIAL     PLAN:  PT FREQUENCY: 1-2x/week  PT DURATION: 6 weeks  PLANNED INTERVENTIONS: 97164- PT Re-evaluation, 97110-Therapeutic exercises, 97530- Therapeutic activity, 97112- Neuromuscular re-education, 97535- Self Care, [id number]- Manual therapy, [id number]- Electrical stimulation (unattended), 20560 (1-2 muscles), 20561 (3+ muscles)- Dry Needling, Patient/Family education, Taping, Joint mobilization, Spinal mobilization, Cryotherapy, and Moist heat  PLAN FOR NEXT SESSION: review and updated HEP as indicated, address L shoulder/periscapular stabilization, patient education regarding management of symptoms, otherwise progress towards established rehab goals as tolerated   Shanda Code, SPTA  03/05/2024 12:16 PM

## 2024-03-06 ENCOUNTER — Ambulatory Visit

## 2024-03-07 ENCOUNTER — Ambulatory Visit

## 2024-03-07 DIAGNOSIS — M25512 Pain in left shoulder: Secondary | ICD-10-CM | POA: Diagnosis not present

## 2024-03-07 DIAGNOSIS — M6281 Muscle weakness (generalized): Secondary | ICD-10-CM

## 2024-03-07 DIAGNOSIS — G8929 Other chronic pain: Secondary | ICD-10-CM

## 2024-03-07 DIAGNOSIS — M25561 Pain in right knee: Secondary | ICD-10-CM | POA: Diagnosis not present

## 2024-03-07 NOTE — Therapy (Signed)
 OUTPATIENT PHYSICAL THERAPY TREATMENT NOTE   Patient Name: Kim [name] MRN: [id number] DOB:1970-12-16, 53 y.o., female Today's Date: 03/07/2024  END OF SESSION:  PT End of Session - 03/07/24 0914     Visit Number 3    Number of Visits 12    Date for Recertification  03/24/24    Authorization Type [employer]    Authorization Time Period 12 visits approved 02/08/24-06/10/24    Authorization - Visit Number 2    Authorization - Number of Visits 12    PT Start Time 0915    PT Stop Time 0955    PT Time Calculation (min) 40 min    Activity Tolerance Patient tolerated treatment well    Behavior During Therapy [facility] for tasks assessed/performed          Past Medical History:  Diagnosis Date   Abnormal Pap smear    patient states she had cancer that was removed from her cervix   Asthma    when pregnant/ once 6 yrs ago only time asthma attack the dr said   Cancer [facility])    Clotting disorder    Constipation    GERD (gastroesophageal reflux disease) 10/19/2015   Gestational diabetes    H. pylori infection    Helicobacter pylori (H. pylori) infection 09/13/2023   Hemorrhoids    History of gestational diabetes    Hx of pulmonary embolus during pregnancy 11/09/2014   July 2016: CTA notable for pulmonary embolus to the posterior right  lower lobe. Provoked given pregnancy.     Coumadin  until 02/09/15.      Intramural leiomyoma of uterus 02/29/2016   Pulmonary embolism (HCC)    Renal insufficiency    TMJ (sprain of temporomandibular joint), initial encounter 12/29/2020   Vitamin D  deficiency    Vitamin D  deficiency    Past Surgical History:  Procedure Laterality Date   CHOLECYSTECTOMY     Patient Active Problem List   Diagnosis Date Noted   Neuropathy of right lateral plantar nerve 01/03/2024   Plantar fasciitis 12/05/2023   Well woman exam with routine gynecological exam 11/02/2023   Polyarthralgia 08/08/2023   Anxiety disorder due to general medical condition  with panic attack 01/31/2020   Cerebral aneurysm without rupture 01/31/2020   Intramural leiomyoma of uterus 02/29/2016   History of pulmonary embolism 02/24/2015    PCP: [name], MD  REFERRING PROVIDER:  Valdemar Rocky SAUNDERS, NP     REFERRING DIAG: Chronic left shoulder pain [M25.512, G89.29]   THERAPY DIAG:  Left shoulder pain, unspecified chronicity  Chronic pain of right knee  Muscle weakness (generalized)  Rationale for Evaluation and Treatment: Rehabilitation  ONSET DATE: 1 month  SUBJECTIVE:  SUBJECTIVE STATEMENT: In-person interpreter was present throughout today's evaluation.   Patient states that she isn't having an pain today. She states that she had some soreness after the last session.   EVAL: Patient reports that her L shoulder began with a mild pain for a few days. Then, one day it hurt so bad that I couldn't really move it. The day before she was lifting some cases of water. She states that it has been about 3 weeks to 1 months.    Hand dominance: Right  PERTINENT HISTORY: Relevant PMHx includes Pulmonary embolism, Renal insufficiency, Polyarthralgia, cerebral aneurysm   PAIN:  Are you having pain?  Yes: NPRS scale: 3/10 current, 10/10 worst  Pain location: L shoulder (anterior)  Pain description: popping/clicking,  Aggravating factors: Upper body dressing, lifting heavy things, sleeping on L side Relieving factors: I don't know, also taking pain medicine -- unsure if it's helping   PRECAUTIONS: None  RED FLAGS: None   WEIGHT BEARING RESTRICTIONS: No  FALLS:  Has patient fallen in last 6 months? No  However, she did have a fall about 1 year ago, when she hit R side of head, but doesn't remember the details of how she fell  LIVING ENVIRONMENT: Lives with:  lives with their family and lives with their spouse Lives in: House/apartment  OCCUPATION: [name]   PLOF: Independent  PATIENT GOALS:to have less pain, to be able to move my left arm normally   NEXT MD VISIT: 02/09/2024 Neurology, 03/05/2024 PCP  OBJECTIVE:  Note: Objective measures were completed at Evaluation unless otherwise noted.  DIAGNOSTIC FINDINGS:  01/18/2024: IMPRESSION: Mild elevation of the LEFT clavicle relative to the acromion is suspicious for [facility] joint separation of uncertain chronicity.  PATIENT SURVEYS:  Quick Dash:  QUICK DASH  Please rate your ability do the following activities in the last week by selecting the number below the appropriate response.   Activities Rating  Open a tight or new jar.  4 = Severe difficulty  Do heavy household chores (e.g., wash walls, floors). 4 = Severe difficulty  Carry a shopping bag or briefcase 5 = Unable  Wash your back. 4 = Severe difficulty  Use a knife to cut food. 4 = Severe difficulty  Recreational activities in which you take some force or impact through your arm, shoulder or hand (e.g., golf, hammering, tennis, etc.). 5 = Unable  During the past week, to what extent has your arm, shoulder or hand problem interfered with your normal social activities with family, friends, neighbors or groups?  3 = Moderately  During the past week, were you limited in your work or other regular daily activities as a result of your arm, shoulder or hand problem? 3 = Moderately limited  Rate the severity of the following symptoms in the last week: Arm, Shoulder, or hand pain. 3 = Moderate  Rate the severity of the following symptoms in the last week: Tingling (pins and needles) in your arm, shoulder or hand. 2 = Mild  During the past week, how much difficulty have you had sleeping because of the pain in your arm, shoulder or hand?  4 = Severe difficulty   (A QuickDASH score may not be calculated if there is greater than 1 missing  item.)  Quick Dash Disability/Symptom Score: 68.2  Minimally Clinically Important Difference (MCID): 15-20 points  (Franchignoni, F. et al. (2013). Minimally clinically important difference of the disabilities of the arm, shoulder, and hand outcome measures (DASH) and its shortened version (Quick  DASH). Journal of Orthopaedic & Sports Physical Therapy, 44(1), 30-39)   COGNITION: Overall cognitive status: Within functional limits for tasks assessed     SENSATION: Not tested   UPPER EXTREMITY ROM:   Active ROM Right eval Left eval  Shoulder flexion  146  Shoulder extension    Shoulder abduction  90  Shoulder adduction    Shoulder internal rotation  WFL  Shoulder external rotation  WFL   Elbow flexion    Elbow extension    Wrist flexion    Wrist extension    Wrist ulnar deviation    Wrist radial deviation    Wrist pronation    Wrist supination    (Blank rows = not tested)  UPPER EXTREMITY MMT:  MMT Right eval Left eval  Shoulder flexion 5 3+  Shoulder extension    Shoulder abduction 5 3+  Shoulder adduction    Shoulder internal rotation  4-  Shoulder external rotation  3+  Middle trapezius    Lower trapezius    Elbow flexion  4  Elbow extension  4  Wrist flexion    Wrist extension    Wrist ulnar deviation    Wrist radial deviation    Wrist pronation    Wrist supination    Grip strength (lbs)    (Blank rows = not tested)                                                                                                                              TREATMENT DATE:  [facility] Adult PT Treatment:                                                DATE: 03/07/24 Therapeutic Exercise: Pulleys flexion/scaption x2' ea Supine chest press with 2.5# dowel 2x10 Supine shoulder flexion with dowel wide grip 2x10 Supine shoulder abduction AAROM with dowel x10 (pain) Supine Elbow flex/ext 500g ball LUE 2x15 Neuromuscular re-ed: Rows RTB 2x10 Shoulder extension YTB x8 (too  painful) ER/IR walkouts x5 ea YTB LUE only Manual Therapy PROM all directions with gentle oscillations at end range STM anterior deltoid, pec, bicep Grades 1-3 inferior glide  [facility] Adult PT Treatment:                                                DATE: 03/05/24 Therapeutic Exercise: Pulleys flexion/scaption x2' ea Supine chest press with dowel 2x10 Supine shoulder flexion with dowel wide grip 2x10 Supine shoulder abduction AAROM with dowel 2x10 Elbow flex/ext 500g ball LUE 2x15 Seated abduction AAROM with dowel 2x10 Neuromuscular re-ed: Rows RTB 2x10 Shoulder extension RTB x4 (too painful) ER/IR walkouts x5 ea YTB LUE only Seated BIL ER with  scap retraction RTB 2x10  [facility] Adult PT Treatment:                                                DATE: 02/08/2024   Initial evaluation: see patient education and home exercise program as noted below     PATIENT EDUCATION: Education details: reviewed initial home exercise program; discussion of POC, prognosis and goals for skilled PT   Person educated: Patient Education method: Explanation, Demonstration, and Handouts Education comprehension: verbalized understanding, returned demonstration, and needs further education  HOME EXERCISE PROGRAM: Access Code: [email or web address] URL: [email or web address] Date: 02/08/2024 Prepared by: Marko Molt  Exercises - Seated Bilateral Shoulder Flexion Towel Slide at Table Top  - 2 x daily - 2 sets - 10 reps - 3 sec hold - Seated Shoulder Abduction Towel Slide at Table Top  - 2 x daily - 2 sets - 10 reps - 3 sec hold - Isometric Shoulder Flexion at Wall  - 2 x daily - 2 sets - 10 reps - 5 sec hold - Isometric Shoulder Abduction at Wall  - 2 x daily - 2 sets - 10 reps - 5 sec hold - Isometric Shoulder Extension at Wall  - 2 x daily - 2 sets - 10 reps - 5 sec hold - Isometric Shoulder Adduction  - 2 x daily - 2 sets - 10 reps - 5 sec hold  ASSESSMENT:  CLINICAL IMPRESSION: Patient  presents to PT reporting no pain today, just some soreness after the last session. She had increased pain in the anterior portion of her shoulder with extension with theraband, with decreased resistance. She also had increased pain with active assisted shoulder abduction, terminated exercises. Today's session focused on shoulder strengthening and mobility. Patient demonstrated decreased pain and tension from manual therapy techniques for relief. Patient will benefit from skilled PT in order to increase functional mobility.  EVAL: Kim [name] is a 53 y.o. female who was seen today for physical therapy evaluation and treatment for L shoulder pain with mobility deficits, with related AC separation. She is demonstrating diminished L UE MMT scores, and pain with overhead reaching. She has related pain and difficulty with upper body dressing, heavy lifting, and diminished sleep quality d/t pain when laying on L side.. She requires skilled PT services at this time to address relevant deficits and improve overall function.     OBJECTIVE IMPAIRMENTS: decreased activity tolerance, decreased knowledge of condition, decreased ROM, decreased strength, impaired UE functional use, and pain.   ACTIVITY LIMITATIONS: carrying, lifting, sleeping, bathing, dressing, reach over head, and hygiene/grooming  PARTICIPATION LIMITATIONS: meal prep, cleaning, laundry, interpersonal relationship, driving, shopping, and community activity  PERSONAL FACTORS: 3+ comorbidities: Relevant PMHx includes Pulmonary embolism, Renal insufficiency, Polyarthralgia, cerebral aneurysm  are also affecting patient's functional outcome.   REHAB POTENTIAL: Good  CLINICAL DECISION MAKING: Stable/uncomplicated  EVALUATION COMPLEXITY: Low   GOALS: Goals reviewed with patient? YES  SHORT TERM GOALS: Target date: 02/29/2024   Patient will be independent with initial home program at least 3 days/week.  Baseline: provided at eval Goal Status:  MET Pt reports adherence 03/05/24    2.  Patient will demonstrate 10-15 degree improvement in L shoulder elevation. Baseline: 146deg shoulder flexion, 90deg shoulder abduction  Goal status: Ongoing   LONG TERM GOALS: Target date: 03/21/2024    Patient will report improved overall functional ability  with QuickDASH score of 30 or less.  Baseline: 68.2 Goal Status: INITIAL    2.  Patient will demonstrate improved L shoulder MMT scores of 4+/5 or greater.  Baseline: see objective measures  Goal status: INITIAL  3.  Patient will report ability to perform all ADLs, including Upper Body Dressing without pain or difficulty  Baseline: moderately limited by pain and ROM deficits Goal status: INITIAL  4.  Patient will demonstrate ability to perform floor to waist lifting of at least 25# using appropriate body mechanics and with no more than minimal pain in order to safely perform normal daily/occupational tasks.   Goal Status: INITIAL   5.  Patient will demonstrate ability to perform overhead lifting of at least 5# using appropriate body mechanics and with no more than minimal pain in order to safely perform normal daily/occupational tasks.   Goal Status: INITIAL     PLAN:  PT FREQUENCY: 1-2x/week  PT DURATION: 6 weeks  PLANNED INTERVENTIONS: 97164- PT Re-evaluation, 97110-Therapeutic exercises, 97530- Therapeutic activity, 97112- Neuromuscular re-education, 97535- Self Care, [id number]- Manual therapy, [id number]- Electrical stimulation (unattended), 20560 (1-2 muscles), 20561 (3+ muscles)- Dry Needling, Patient/Family education, Taping, Joint mobilization, Spinal mobilization, Cryotherapy, and Moist heat  PLAN FOR NEXT SESSION: review and updated HEP as indicated, address L shoulder/periscapular stabilization, patient education regarding management of symptoms, otherwise progress towards established rehab goals as tolerated   Shanda Code, SPTA 03/07/2024 9:19 AM

## 2024-03-21 ENCOUNTER — Ambulatory Visit: Attending: Internal Medicine

## 2024-03-21 DIAGNOSIS — G8929 Other chronic pain: Secondary | ICD-10-CM | POA: Diagnosis present

## 2024-03-21 DIAGNOSIS — M25561 Pain in right knee: Secondary | ICD-10-CM | POA: Insufficient documentation

## 2024-03-21 DIAGNOSIS — M6281 Muscle weakness (generalized): Secondary | ICD-10-CM | POA: Diagnosis present

## 2024-03-21 DIAGNOSIS — M5459 Other low back pain: Secondary | ICD-10-CM | POA: Diagnosis present

## 2024-03-21 DIAGNOSIS — M25571 Pain in right ankle and joints of right foot: Secondary | ICD-10-CM | POA: Insufficient documentation

## 2024-03-21 DIAGNOSIS — M25512 Pain in left shoulder: Secondary | ICD-10-CM | POA: Diagnosis present

## 2024-03-21 NOTE — Therapy (Signed)
 OUTPATIENT PHYSICAL THERAPY TREATMENT NOTE   Patient Name: [facility] Everitt Elder MRN: [id number] DOB:[birth year], 53 y.o., female Today's Date: 03/21/2024  END OF SESSION:  PT End of Session - 03/21/24 1047     Visit Number 4    Number of Visits 12    Date for Recertification  03/24/24    Authorization Type [employer]    Authorization Time Period 12 visits approved 02/08/24-06/10/24    Authorization - Visit Number 3    Authorization - Number of Visits 12    PT Start Time 1047    PT Stop Time 1127    PT Time Calculation (min) 40 min          Past Medical History:  Diagnosis Date   Abnormal Pap smear    patient states she had cancer that was removed from her cervix   Asthma    when pregnant/ once 6 yrs ago only time asthma attack the dr said   Cancer [facility])    Clotting disorder    Constipation    GERD (gastroesophageal reflux disease) 10/19/2015   Gestational diabetes    H. pylori infection    Helicobacter pylori (H. pylori) infection 09/13/2023   Hemorrhoids    History of gestational diabetes    Hx of pulmonary embolus during pregnancy 11/09/2014   July 2016: CTA notable for pulmonary embolus to the posterior right  lower lobe. Provoked given pregnancy.     Coumadin  until 02/09/15.      Intramural leiomyoma of uterus 02/29/2016   Pulmonary embolism (HCC)    Renal insufficiency    TMJ (sprain of temporomandibular joint), initial encounter 12/29/2020   Vitamin D  deficiency    Vitamin D  deficiency    Past Surgical History:  Procedure Laterality Date   CHOLECYSTECTOMY     Patient Active Problem List   Diagnosis Date Noted   Neuropathy of right lateral plantar nerve 01/03/2024   Plantar fasciitis 12/05/2023   Well woman exam with routine gynecological exam 11/02/2023   Polyarthralgia 08/08/2023   Anxiety disorder due to general medical condition with panic attack 01/31/2020   Cerebral aneurysm without rupture 01/31/2020   Intramural leiomyoma of uterus  02/29/2016   History of pulmonary embolism 02/24/2015    PCP: [name], MD  REFERRING PROVIDER:  Valdemar Rocky SAUNDERS, NP     REFERRING DIAG: Chronic left shoulder pain [M25.512, G89.29]   THERAPY DIAG:  Left shoulder pain, unspecified chronicity  Muscle weakness (generalized)  Other low back pain  Rationale for Evaluation and Treatment: Rehabilitation  ONSET DATE: 1 month  SUBJECTIVE:  SUBJECTIVE STATEMENT: In-person interpreter was present throughout today's evaluation.   Patient states that she isn't having any pain today, has had just a little pain since last session. She was a little sore after her last session.  EVAL: Patient reports that her L shoulder began with a mild pain for a few days. Then, one day it hurt so bad that I couldn't really move it. The day before she was lifting some cases of water. She states that it has been about 3 weeks to 1 months.    Hand dominance: Right  PERTINENT HISTORY: Relevant PMHx includes Pulmonary embolism, Renal insufficiency, Polyarthralgia, cerebral aneurysm   PAIN:  Are you having pain?  Yes: NPRS scale: 3/10 current, 10/10 worst  Pain location: L shoulder (anterior)  Pain description: popping/clicking,  Aggravating factors: Upper body dressing, lifting heavy things, sleeping on L side Relieving factors: I don't know, also taking pain medicine -- unsure if it's helping   PRECAUTIONS: None  RED FLAGS: None   WEIGHT BEARING RESTRICTIONS: No  FALLS:  Has patient fallen in last 6 months? No  However, she did have a fall about 1 year ago, when she hit R side of head, but doesn't remember the details of how she fell  LIVING ENVIRONMENT: Lives with: lives with their family and lives with their spouse Lives in:  House/apartment  OCCUPATION: [name]   PLOF: Independent  PATIENT GOALS:to have less pain, to be able to move my left arm normally   NEXT MD VISIT: 02/09/2024 Neurology, 03/05/2024 PCP  OBJECTIVE:  Note: Objective measures were completed at Evaluation unless otherwise noted.  DIAGNOSTIC FINDINGS:  01/18/2024: IMPRESSION: Mild elevation of the LEFT clavicle relative to the acromion is suspicious for [facility] joint separation of uncertain chronicity.  PATIENT SURVEYS:  Quick Dash:  QUICK DASH  Please rate your ability do the following activities in the last week by selecting the number below the appropriate response.   Activities Rating  Open a tight or new jar.  4 = Severe difficulty  Do heavy household chores (e.g., wash walls, floors). 4 = Severe difficulty  Carry a shopping bag or briefcase 5 = Unable  Wash your back. 4 = Severe difficulty  Use a knife to cut food. 4 = Severe difficulty  Recreational activities in which you take some force or impact through your arm, shoulder or hand (e.g., golf, hammering, tennis, etc.). 5 = Unable  During the past week, to what extent has your arm, shoulder or hand problem interfered with your normal social activities with family, friends, neighbors or groups?  3 = Moderately  During the past week, were you limited in your work or other regular daily activities as a result of your arm, shoulder or hand problem? 3 = Moderately limited  Rate the severity of the following symptoms in the last week: Arm, Shoulder, or hand pain. 3 = Moderate  Rate the severity of the following symptoms in the last week: Tingling (pins and needles) in your arm, shoulder or hand. 2 = Mild  During the past week, how much difficulty have you had sleeping because of the pain in your arm, shoulder or hand?  4 = Severe difficulty   (A QuickDASH score may not be calculated if there is greater than 1 missing item.)  Quick Dash Disability/Symptom Score: 68.2  Minimally  Clinically Important Difference (MCID): 15-20 points  (Franchignoni, F. et al. (2013). Minimally clinically important difference of the disabilities of the arm, shoulder, and hand outcome measures (  DASH) and its shortened version (Quick DASH). Journal of Orthopaedic & Sports Physical Therapy, 44(1), 30-39)   COGNITION: Overall cognitive status: Within functional limits for tasks assessed     SENSATION: Not tested   UPPER EXTREMITY ROM:   Active ROM Right eval Left eval  Shoulder flexion  146  Shoulder extension    Shoulder abduction  90  Shoulder adduction    Shoulder internal rotation  WFL  Shoulder external rotation  WFL   Elbow flexion    Elbow extension    Wrist flexion    Wrist extension    Wrist ulnar deviation    Wrist radial deviation    Wrist pronation    Wrist supination    (Blank rows = not tested)  UPPER EXTREMITY MMT:  MMT Right eval Left eval  Shoulder flexion 5 3+  Shoulder extension    Shoulder abduction 5 3+  Shoulder adduction    Shoulder internal rotation  4-  Shoulder external rotation  3+  Middle trapezius    Lower trapezius    Elbow flexion  4  Elbow extension  4  Wrist flexion    Wrist extension    Wrist ulnar deviation    Wrist radial deviation    Wrist pronation    Wrist supination    Grip strength (lbs)    (Blank rows = not tested)                                                                                                                              TREATMENT DATE:  [facility] Adult PT Treatment:                                                DATE: 03/21/24 Therapeutic Exercise: UBE fwd/bwd 2 min ea Pulleys flexion/scaption x2' ea Supine shoulder flexion with dowel wide grip 2x10 Supine shoulder abduction AAROM with dowel x10 (painful at about 40 degrees, anterior shoulder) Supine Elbow flex/ext 1000g ball LUE 2x15 Neuromuscular re-ed: Rows RTB 2x10 Shoulder extension YTB x10, x5 (painful) ER/IR walkouts 2x5 ea YTB   [facility]  Adult PT Treatment:                                                DATE: 03/07/24 Therapeutic Exercise: Pulleys flexion/scaption x2' ea Supine chest press with 2.5# dowel 2x10 Supine shoulder flexion with dowel wide grip 2x10 Supine shoulder abduction AAROM with dowel x10 (pain) Supine Elbow flex/ext 500g ball LUE 2x15 Neuromuscular re-ed: Rows RTB 2x10 Shoulder extension YTB x8 (too painful) ER/IR walkouts x5 ea YTB LUE only Manual Therapy PROM all directions with gentle oscillations at end range STM anterior deltoid, pec, bicep Grades 1-3  inferior glide  [facility] Adult PT Treatment:                                                DATE: 03/05/24 Therapeutic Exercise: Pulleys flexion/scaption x2' ea Supine chest press with dowel 2x10 Supine shoulder flexion with dowel wide grip 2x10 Supine shoulder abduction AAROM with dowel 2x10 Elbow flex/ext 500g ball LUE 2x15 Seated abduction AAROM with dowel 2x10 Neuromuscular re-ed: Rows RTB 2x10 Shoulder extension RTB x4 (too painful) ER/IR walkouts x5 ea YTB LUE only Seated BIL ER with scap retraction RTB 2x10  [facility] Adult PT Treatment:                                                DATE: 02/08/2024   Initial evaluation: see patient education and home exercise program as noted below     PATIENT EDUCATION: Education details: reviewed initial home exercise program; discussion of POC, prognosis and goals for skilled PT   Person educated: Patient Education method: Explanation, Demonstration, and Handouts Education comprehension: verbalized understanding, returned demonstration, and needs further education  HOME EXERCISE PROGRAM: Access Code: [email or web address] URL: [email or web address] Date: 02/08/2024 Prepared by: Marko Molt  Exercises - Seated Bilateral Shoulder Flexion Towel Slide at Table Top  - 2 x daily - 2 sets - 10 reps - 3 sec hold - Seated Shoulder Abduction Towel Slide at Table Top  - 2 x daily - 2 sets - 10 reps -  3 sec hold - Isometric Shoulder Flexion at Wall  - 2 x daily - 2 sets - 10 reps - 5 sec hold - Isometric Shoulder Abduction at Wall  - 2 x daily - 2 sets - 10 reps - 5 sec hold - Isometric Shoulder Extension at Wall  - 2 x daily - 2 sets - 10 reps - 5 sec hold - Isometric Shoulder Adduction  - 2 x daily - 2 sets - 10 reps - 5 sec hold  ASSESSMENT:  CLINICAL IMPRESSION: Patient presents to PT reporting that she is having some soreness in her shoulder. She states that she has been compliant with her HEP. Today's session focused on shoulder range of motion and periscapular strengthening. Patient reports that she has anterior shoulder pain with scaption movement. With AAROM into abduction she had pain at about 30 degrees and during shoulder extension there was pain on the eccentric motion. All other motions are pain free. Patient will benefit from skilled PT in order to decrease pain and increase functional mobility.   EVAL: Mattalynn is a 53 y.o. female who was seen today for physical therapy evaluation and treatment for L shoulder pain with mobility deficits, with related AC separation. She is demonstrating diminished L UE MMT scores, and pain with overhead reaching. She has related pain and difficulty with upper body dressing, heavy lifting, and diminished sleep quality d/t pain when laying on L side.. She requires skilled PT services at this time to address relevant deficits and improve overall function.     OBJECTIVE IMPAIRMENTS: decreased activity tolerance, decreased knowledge of condition, decreased ROM, decreased strength, impaired UE functional use, and pain.   ACTIVITY LIMITATIONS: carrying, lifting, sleeping, bathing, dressing, reach over head, and hygiene/grooming  PARTICIPATION LIMITATIONS: meal prep, cleaning,  laundry, interpersonal relationship, driving, shopping, and community activity  PERSONAL FACTORS: 3+ comorbidities: Relevant PMHx includes Pulmonary embolism, Renal insufficiency,  Polyarthralgia, cerebral aneurysm  are also affecting patient's functional outcome.   REHAB POTENTIAL: Good  CLINICAL DECISION MAKING: Stable/uncomplicated  EVALUATION COMPLEXITY: Low   GOALS: Goals reviewed with patient? YES  SHORT TERM GOALS: Target date: 02/29/2024   Patient will be independent with initial home program at least 3 days/week.  Baseline: provided at eval Goal Status: MET Pt reports adherence 03/05/24    2.  Patient will demonstrate 10-15 degree improvement in L shoulder elevation. Baseline: 146deg shoulder flexion, 90deg shoulder abduction  Goal status: Ongoing   LONG TERM GOALS: Target date: 03/21/2024    Patient will report improved overall functional ability with QuickDASH score of 30 or less.  Baseline: 68.2 Goal Status: INITIAL    2.  Patient will demonstrate improved L shoulder MMT scores of 4+/5 or greater.  Baseline: see objective measures  Goal status: INITIAL  3.  Patient will report ability to perform all ADLs, including Upper Body Dressing without pain or difficulty  Baseline: moderately limited by pain and ROM deficits Goal status: INITIAL  4.  Patient will demonstrate ability to perform floor to waist lifting of at least 25# using appropriate body mechanics and with no more than minimal pain in order to safely perform normal daily/occupational tasks.   Goal Status: INITIAL   5.  Patient will demonstrate ability to perform overhead lifting of at least 5# using appropriate body mechanics and with no more than minimal pain in order to safely perform normal daily/occupational tasks.   Goal Status: INITIAL     PLAN:  PT FREQUENCY: 1-2x/week  PT DURATION: 6 weeks  PLANNED INTERVENTIONS: 97164- PT Re-evaluation, 97110-Therapeutic exercises, 97530- Therapeutic activity, 97112- Neuromuscular re-education, 97535- Self Care, [id number]- Manual therapy, [id number]- Electrical stimulation (unattended), 20560 (1-2 muscles), 20561 (3+ muscles)- Dry  Needling, Patient/Family education, Taping, Joint mobilization, Spinal mobilization, Cryotherapy, and Moist heat  PLAN FOR NEXT SESSION: review and updated HEP as indicated, address L shoulder/periscapular stabilization, patient education regarding management of symptoms, otherwise progress towards established rehab goals as tolerated   Shanda Code, SPTA 03/21/2024 10:48 AM

## 2024-03-26 ENCOUNTER — Ambulatory Visit

## 2024-03-26 DIAGNOSIS — M25512 Pain in left shoulder: Secondary | ICD-10-CM

## 2024-03-26 DIAGNOSIS — M6281 Muscle weakness (generalized): Secondary | ICD-10-CM

## 2024-03-26 NOTE — Therapy (Signed)
 OUTPATIENT PHYSICAL THERAPY TREATMENT NOTE   Patient Name: [facility] MRN: [id number] DOB:Dec 30, 1970, 53 y.o., female Today's Date: 03/26/2024  END OF SESSION:  PT End of Session - 03/26/24 1053     Visit Number 5    Number of Visits 12    Date for Recertification  03/24/24    Authorization Type [employer]    Authorization Time Period 12 visits approved 02/08/24-06/10/24    Authorization - Visit Number 4    Authorization - Number of Visits 12    PT Start Time 1048    PT Stop Time 1126    PT Time Calculation (min) 38 min    Activity Tolerance Patient tolerated treatment well    Behavior During Therapy WFL for tasks assessed/performed           Past Medical History:  Diagnosis Date   Abnormal Pap smear    patient states she had cancer that was removed from her cervix   Asthma    when pregnant/ once 6 yrs ago only time asthma attack the dr said   Cancer [facility])    Clotting disorder    Constipation    GERD (gastroesophageal reflux disease) 10/19/2015   Gestational diabetes    H. pylori infection    Helicobacter pylori (H. pylori) infection 09/13/2023   Hemorrhoids    History of gestational diabetes    Hx of pulmonary embolus during pregnancy 11/09/2014   July 2016: CTA notable for pulmonary embolus to the posterior right  lower lobe. Provoked given pregnancy.     Coumadin  until 02/09/15.      Intramural leiomyoma of uterus 02/29/2016   Pulmonary embolism (HCC)    Renal insufficiency    TMJ (sprain of temporomandibular joint), initial encounter 12/29/2020   Vitamin D  deficiency    Vitamin D  deficiency    Past Surgical History:  Procedure Laterality Date   CHOLECYSTECTOMY     Patient Active Problem List   Diagnosis Date Noted   Neuropathy of right lateral plantar nerve 01/03/2024   Plantar fasciitis 12/05/2023   Well woman exam with routine gynecological exam 11/02/2023   Polyarthralgia 08/08/2023   Anxiety disorder due to general medical condition  with panic attack 01/31/2020   Cerebral aneurysm without rupture 01/31/2020   Intramural leiomyoma of uterus 02/29/2016   History of pulmonary embolism 02/24/2015    PCP: [name], MD  REFERRING PROVIDER:  Valdemar Rocky SAUNDERS, NP     REFERRING DIAG: Chronic left shoulder pain [M25.512, G89.29]   THERAPY DIAG:  Left shoulder pain, unspecified chronicity  Muscle weakness (generalized)  Rationale for Evaluation and Treatment: Rehabilitation  ONSET DATE: 1 month  SUBJECTIVE:  SUBJECTIVE STATEMENT: In-person interpreter was present throughout today's evaluation.   Patient she continues to have L shoulder pain that is worse with overhead reaching, reaching behind, and folding clothes. She states that her pain is getting a little better.   EVAL: Patient reports that her L shoulder began with a mild pain for a few days. Then, one day it hurt so bad that I couldn't really move it. The day before she was lifting some cases of water. She states that it has been about 3 weeks to 1 months.    Hand dominance: Right  PERTINENT HISTORY: Relevant PMHx includes Pulmonary embolism, Renal insufficiency, Polyarthralgia, cerebral aneurysm   PAIN:  Are you having pain?  Yes: NPRS scale: 3/10 current, 10/10 worst  Pain location: L shoulder (anterior)  Pain description: popping/clicking,  Aggravating factors: Upper body dressing, lifting heavy things, sleeping on L side Relieving factors: I don't know, also taking pain medicine -- unsure if it's helping   PRECAUTIONS: None  RED FLAGS: None   WEIGHT BEARING RESTRICTIONS: No  FALLS:  Has patient fallen in last 6 months? No  However, she did have a fall about 1 year ago, when she hit R side of head, but doesn't remember the details of how she  fell  LIVING ENVIRONMENT: Lives with: lives with their family and lives with their spouse Lives in: House/apartment  OCCUPATION: [name]   PLOF: Independent  PATIENT GOALS:to have less pain, to be able to move my left arm normally   NEXT MD VISIT: 02/09/2024 Neurology, 03/05/2024 PCP  OBJECTIVE:  Note: Objective measures were completed at Evaluation unless otherwise noted.  DIAGNOSTIC FINDINGS:  01/18/2024: IMPRESSION: Mild elevation of the LEFT clavicle relative to the acromion is suspicious for [facility] joint separation of uncertain chronicity.  PATIENT SURVEYS:  Quick Dash:  QUICK DASH  Please rate your ability do the following activities in the last week by selecting the number below the appropriate response.   Activities Rating  Open a tight or new jar.  4 = Severe difficulty  Do heavy household chores (e.g., wash walls, floors). 4 = Severe difficulty  Carry a shopping bag or briefcase 5 = Unable  Wash your back. 4 = Severe difficulty  Use a knife to cut food. 4 = Severe difficulty  Recreational activities in which you take some force or impact through your arm, shoulder or hand (e.g., golf, hammering, tennis, etc.). 5 = Unable  During the past week, to what extent has your arm, shoulder or hand problem interfered with your normal social activities with family, friends, neighbors or groups?  3 = Moderately  During the past week, were you limited in your work or other regular daily activities as a result of your arm, shoulder or hand problem? 3 = Moderately limited  Rate the severity of the following symptoms in the last week: Arm, Shoulder, or hand pain. 3 = Moderate  Rate the severity of the following symptoms in the last week: Tingling (pins and needles) in your arm, shoulder or hand. 2 = Mild  During the past week, how much difficulty have you had sleeping because of the pain in your arm, shoulder or hand?  4 = Severe difficulty   (A QuickDASH score may not be calculated if  there is greater than 1 missing item.)  Quick Dash Disability/Symptom Score: 68.2  Minimally Clinically Important Difference (MCID): 15-20 points  (Franchignoni, F. et al. (2013). Minimally clinically important difference of the disabilities of the arm, shoulder, and  hand outcome measures (DASH) and its shortened version (Quick DASH). Journal of Orthopaedic & Sports Physical Therapy, 44(1), 30-39)   COGNITION: Overall cognitive status: Within functional limits for tasks assessed     SENSATION: Not tested   UPPER EXTREMITY ROM:   Active ROM Right eval Left eval  Shoulder flexion  146  Shoulder extension    Shoulder abduction  90  Shoulder adduction    Shoulder internal rotation  WFL  Shoulder external rotation  WFL   Elbow flexion    Elbow extension    Wrist flexion    Wrist extension    Wrist ulnar deviation    Wrist radial deviation    Wrist pronation    Wrist supination    (Blank rows = not tested)  UPPER EXTREMITY MMT:  MMT Right eval Left eval  Shoulder flexion 5 3+  Shoulder extension    Shoulder abduction 5 3+  Shoulder adduction    Shoulder internal rotation  4-  Shoulder external rotation  3+  Middle trapezius    Lower trapezius    Elbow flexion  4  Elbow extension  4  Wrist flexion    Wrist extension    Wrist ulnar deviation    Wrist radial deviation    Wrist pronation    Wrist supination    Grip strength (lbs)    (Blank rows = not tested)                                                                                                                              TREATMENT DATE:   [facility] Adult PT Treatment:                                                DATE: 03/25/24  Therapeutic Exercise: UBE fwd/bwd 3 min ea, lvl 2  Pulleys flexion/scaption x2'   Neuromuscular re-ed: Rows RTB x15 Rows GTB x 10 ER/IR walkouts x10 ea YTB   Therapeutic Activity:  Reassessment of objective measures and subjective assessment regarding progress towards  established goals and updated plan for addressing remaining deficits and rehab goals.    [facility] Adult PT Treatment:                                                DATE: 03/21/24 Therapeutic Exercise: UBE fwd/bwd 2 min ea Pulleys flexion/scaption x2' ea Supine shoulder flexion with dowel wide grip 2x10 Supine shoulder abduction AAROM with dowel x10 (painful at about 40 degrees, anterior shoulder) Supine Elbow flex/ext 1000g ball LUE 2x15 Neuromuscular re-ed: Rows RTB 2x10 Shoulder extension YTB x10, x5 (painful) ER/IR walkouts 2x5 ea YTB   [facility] Adult PT Treatment:  DATE: 03/07/24 Therapeutic Exercise: Pulleys flexion/scaption x2' ea Supine chest press with 2.5# dowel 2x10 Supine shoulder flexion with dowel wide grip 2x10 Supine shoulder abduction AAROM with dowel x10 (pain) Supine Elbow flex/ext 500g ball LUE 2x15 Neuromuscular re-ed: Rows RTB 2x10 Shoulder extension YTB x8 (too painful) ER/IR walkouts x5 ea YTB LUE only Manual Therapy PROM all directions with gentle oscillations at end range STM anterior deltoid, pec, bicep Grades 1-3 inferior glide  [facility] Adult PT Treatment:                                                DATE: 03/05/24 Therapeutic Exercise: Pulleys flexion/scaption x2' ea Supine chest press with dowel 2x10 Supine shoulder flexion with dowel wide grip 2x10 Supine shoulder abduction AAROM with dowel 2x10 Elbow flex/ext 500g ball LUE 2x15 Seated abduction AAROM with dowel 2x10 Neuromuscular re-ed: Rows RTB 2x10 Shoulder extension RTB x4 (too painful) ER/IR walkouts x5 ea YTB LUE only Seated BIL ER with scap retraction RTB 2x10      PATIENT EDUCATION: Education details: reviewed initial home exercise program; discussion of POC, prognosis and goals for skilled PT   Person educated: Patient Education method: Explanation, Demonstration, and Handouts Education comprehension: verbalized understanding, returned  demonstration, and needs further education  HOME EXERCISE PROGRAM: Access Code: [email or web address] URL: [email or web address] Date: 02/08/2024 Prepared by: Marko Molt  Exercises - Seated Bilateral Shoulder Flexion Towel Slide at Table Top  - 2 x daily - 2 sets - 10 reps - 3 sec hold - Seated Shoulder Abduction Towel Slide at Table Top  - 2 x daily - 2 sets - 10 reps - 3 sec hold - Isometric Shoulder Flexion at Wall  - 2 x daily - 2 sets - 10 reps - 5 sec hold - Isometric Shoulder Abduction at Wall  - 2 x daily - 2 sets - 10 reps - 5 sec hold - Isometric Shoulder Extension at Wall  - 2 x daily - 2 sets - 10 reps - 5 sec hold - Isometric Shoulder Adduction  - 2 x daily - 2 sets - 10 reps - 5 sec hold  ASSESSMENT:  CLINICAL IMPRESSION: 03/26/2024 Patient has attended 5 PT sessions to address L shoulder pain with mobility deficits with related ACJ separation. She is demonstrating good improvement of shoulder flexion AROM and overhead reaching tolerance. However, she continues to have difficulty with shoulder abduction above 90 degrees, reaching behind. She requires ongoing skilled PT intervention in order to address remaining deficits and progress towards functional rehab goals.    EVAL: Selia is a 53 y.o. female who was seen today for physical therapy evaluation and treatment for L shoulder pain with mobility deficits, with related AC separation. She is demonstrating diminished L UE MMT scores, and pain with overhead reaching. She has related pain and difficulty with upper body dressing, heavy lifting, and diminished sleep quality d/t pain when laying on L side.. She requires skilled PT services at this time to address relevant deficits and improve overall function.     OBJECTIVE IMPAIRMENTS: decreased activity tolerance, decreased knowledge of condition, decreased ROM, decreased strength, impaired UE functional use, and pain.   ACTIVITY LIMITATIONS: carrying, lifting, sleeping,  bathing, dressing, reach over head, and hygiene/grooming  PARTICIPATION LIMITATIONS: meal prep, cleaning, laundry, interpersonal relationship, driving, shopping, and community activity  PERSONAL FACTORS: 3+ comorbidities: Relevant PMHx includes  Pulmonary embolism, Renal insufficiency, Polyarthralgia, cerebral aneurysm  are also affecting patient's functional outcome.   REHAB POTENTIAL: Good  CLINICAL DECISION MAKING: Stable/uncomplicated  EVALUATION COMPLEXITY: Low   GOALS: Goals reviewed with patient? YES  SHORT TERM GOALS: Target date: 02/29/2024   Patient will be independent with initial home program at least 3 days/week.  Baseline: provided at eval Goal Status: MET Pt reports adherence 03/05/24    2.  Patient will demonstrate 10-15 degree improvement in L shoulder elevation. Baseline: 146deg shoulder flexion, 90deg shoulder abduction  03/26/24: 165deg shoulder flexion, 90 deg shoulder abduction in seated  Goal status: Ongoing   LONG TERM GOALS: Target date: 03/21/2024    Patient will report improved overall functional ability with QuickDASH score of 30 or less.  Baseline: 68.2 03/26/24: 31.8 Goal Status: nearly met    2.  Patient will demonstrate improved L shoulder MMT scores of 4+/5 or greater.  Baseline: see objective measures  Goal status: INITIAL  3.  Patient will report ability to perform all ADLs, including Upper Body Dressing without pain or difficulty  Baseline: moderately limited by pain and ROM deficits Goal status: ongoing   4.  Patient will demonstrate ability to perform floor to waist lifting of at least 25# using appropriate body mechanics and with no more than minimal pain in order to safely perform normal daily/occupational tasks.   Goal Status: INITIAL   5.  Patient will demonstrate ability to perform overhead lifting of at least 5# using appropriate body mechanics and with no more than minimal pain in order to safely perform normal  daily/occupational tasks.   Goal Status: INITIAL     PLAN:  PT FREQUENCY: 1-2x/week  PT DURATION: 6 weeks  PLANNED INTERVENTIONS: 97164- PT Re-evaluation, 97110-Therapeutic exercises, 97530- Therapeutic activity, 97112- Neuromuscular re-education, 97535- Self Care, [id number]- Manual therapy, [id number]- Electrical stimulation (unattended), 20560 (1-2 muscles), 20561 (3+ muscles)- Dry Needling, Patient/Family education, Taping, Joint mobilization, Spinal mobilization, Cryotherapy, and Moist heat  PLAN FOR NEXT SESSION: review and updated HEP as indicated, address L shoulder/periscapular stabilization, patient education regarding management of symptoms, otherwise progress towards established rehab goals as tolerated   Marko Molt, PT, DPT  03/26/2024 11:24 AM

## 2024-03-28 ENCOUNTER — Ambulatory Visit

## 2024-03-28 NOTE — Therapy (Incomplete)
 OUTPATIENT PHYSICAL THERAPY RECERTIFICATION   Patient Name: [facility] Kim [name] MRN: [id number] DOB:[birth year], 53 y.o., female Today's Date: 03/28/2024  END OF SESSION:     Past Medical History:  Diagnosis Date   Abnormal Pap smear    patient states she had cancer that was removed from her cervix   Asthma    when pregnant/ once 6 yrs ago only time asthma attack the dr said   Cancer [facility])    Clotting disorder    Constipation    GERD (gastroesophageal reflux disease) 10/19/2015   Gestational diabetes    H. pylori infection    Helicobacter pylori (H. pylori) infection 09/13/2023   Hemorrhoids    History of gestational diabetes    Hx of pulmonary embolus during pregnancy 11/09/2014   July 2016: CTA notable for pulmonary embolus to the posterior right  lower lobe. Provoked given pregnancy.     Coumadin  until 02/09/15.      Intramural leiomyoma of uterus 02/29/2016   Pulmonary embolism (HCC)    Renal insufficiency    TMJ (sprain of temporomandibular joint), initial encounter 12/29/2020   Vitamin D  deficiency    Vitamin D  deficiency    Past Surgical History:  Procedure Laterality Date   CHOLECYSTECTOMY     Patient Active Problem List   Diagnosis Date Noted   Neuropathy of right lateral plantar nerve 01/03/2024   Plantar fasciitis 12/05/2023   Well woman exam with routine gynecological exam 11/02/2023   Polyarthralgia 08/08/2023   Anxiety disorder due to general medical condition with panic attack 01/31/2020   Cerebral aneurysm without rupture 01/31/2020   Intramural leiomyoma of uterus 02/29/2016   History of pulmonary embolism 02/24/2015    PCP: [name], MD  REFERRING PROVIDER:  Valdemar Rocky SAUNDERS, NP     REFERRING DIAG: Chronic left shoulder pain [M25.512, G89.29]   THERAPY DIAG:  No diagnosis found.  Rationale for Evaluation and Treatment: Rehabilitation  ONSET DATE: 1 month  SUBJECTIVE:                                                                                                                                                                                       SUBJECTIVE STATEMENT: In-person interpreter was present throughout today's evaluation.   Patient she continues to have L shoulder pain that is worse with overhead reaching, reaching behind, and folding clothes. She states that her pain is getting a little better. She would like to continue with OP PT.   EVAL: Patient reports that her L shoulder began with a mild pain for a few days. Then, one day it hurt so bad that I couldn't really move it. The  day before she was lifting some cases of water. She states that it has been about 3 weeks to 1 months.    Hand dominance: Right  PERTINENT HISTORY: Relevant PMHx includes Pulmonary embolism, Renal insufficiency, Polyarthralgia, cerebral aneurysm   PAIN:  Are you having pain?  Yes: NPRS scale: 3/10 current, 10/10 worst  Pain location: L shoulder (anterior)  Pain description: popping/clicking,  Aggravating factors: Upper body dressing, lifting heavy things, sleeping on L side Relieving factors: I don't know, also taking pain medicine -- unsure if it's helping   PRECAUTIONS: None  RED FLAGS: None   WEIGHT BEARING RESTRICTIONS: No  FALLS:  Has patient fallen in last 6 months? No  However, she did have a fall about 1 year ago, when she hit R side of head, but doesn't remember the details of how she fell  LIVING ENVIRONMENT: Lives with: lives with their family and lives with their spouse Lives in: House/apartment  OCCUPATION: [name]   PLOF: Independent  PATIENT GOALS:to have less pain, to be able to move my left arm normally   NEXT MD VISIT: 02/09/2024 Neurology, 03/05/2024 PCP  OBJECTIVE:  Note: Objective measures were completed at Evaluation unless otherwise noted.  DIAGNOSTIC FINDINGS:  01/18/2024: IMPRESSION: Mild elevation of the LEFT clavicle relative to the acromion is suspicious for [facility] joint  separation of uncertain chronicity.  PATIENT SURVEYS:  Quick Dash:  QUICK DASH  Please rate your ability do the following activities in the last week by selecting the number below the appropriate response.   Activities Rating  Open a tight or new jar.  4 = Severe difficulty  Do heavy household chores (e.g., wash walls, floors). 4 = Severe difficulty  Carry a shopping bag or briefcase 5 = Unable  Wash your back. 4 = Severe difficulty  Use a knife to cut food. 4 = Severe difficulty  Recreational activities in which you take some force or impact through your arm, shoulder or hand (e.g., golf, hammering, tennis, etc.). 5 = Unable  During the past week, to what extent has your arm, shoulder or hand problem interfered with your normal social activities with family, friends, neighbors or groups?  3 = Moderately  During the past week, were you limited in your work or other regular daily activities as a result of your arm, shoulder or hand problem? 3 = Moderately limited  Rate the severity of the following symptoms in the last week: Arm, Shoulder, or hand pain. 3 = Moderate  Rate the severity of the following symptoms in the last week: Tingling (pins and needles) in your arm, shoulder or hand. 2 = Mild  During the past week, how much difficulty have you had sleeping because of the pain in your arm, shoulder or hand?  4 = Severe difficulty   (A QuickDASH score may not be calculated if there is greater than 1 missing item.)  Quick Dash Disability/Symptom Score: 68.2  Minimally Clinically Important Difference (MCID): 15-20 points  (Franchignoni, F. et al. (2013). Minimally clinically important difference of the disabilities of the arm, shoulder, and hand outcome measures (DASH) and its shortened version (Quick DASH). Journal of Orthopaedic & Sports Physical Therapy, 44(1), 30-39)   COGNITION: Overall cognitive status: Within functional limits for tasks assessed     SENSATION: Not  tested   UPPER EXTREMITY ROM:   Active ROM Right eval Left eval  Shoulder flexion  146  Shoulder extension    Shoulder abduction  90  Shoulder adduction  Shoulder internal rotation  [facility]  Shoulder external rotation  [facility]   Elbow flexion    Elbow extension    Wrist flexion    Wrist extension    Wrist ulnar deviation    Wrist radial deviation    Wrist pronation    Wrist supination    (Blank rows = not tested)  UPPER EXTREMITY MMT:  MMT Right eval Left eval  Shoulder flexion 5 3+  Shoulder extension    Shoulder abduction 5 3+  Shoulder adduction    Shoulder internal rotation  4-  Shoulder external rotation  3+  Middle trapezius    Lower trapezius    Elbow flexion  4  Elbow extension  4  Wrist flexion    Wrist extension    Wrist ulnar deviation    Wrist radial deviation    Wrist pronation    Wrist supination    Grip strength (lbs)    (Blank rows = not tested)                                                                                                                              TREATMENT DATE:  [facility] Adult PT Treatment:                                                DATE: 03/28/24 Therapeutic Exercise: UBE fwd/bwd 3 min ea level 2 Pulleys flexion/scaption x2' ea Supine shoulder flexion with dowel wide grip 2x10 Supine shoulder abduction AAROM with dowel x10  Supine Elbow flex/ext 1000g ball LUE 2x15 Neuromuscular re-ed: Rows RTB 2x10 Shoulder extension YTB x10 ER/IR walkouts 2x10 ea YTB   [facility] Adult PT Treatment:                                                DATE: 03/26/24  UBE fwd/bwd 3 min ea, lvl 2  Pulleys flexion/scaption x2'  Rows RTB x15 Rows GTB x 10 ER/IR walkouts x10 ea YTB   Therapeutic Activity:  Reassessment of objective measures and subjective assessment regarding progress towards established goals and updated plan for addressing remaining deficits and rehab goals.    [facility] Adult PT Treatment:                                                 DATE: 03/21/24 Therapeutic Exercise: UBE fwd/bwd 2 min ea Pulleys flexion/scaption x2' ea Supine shoulder flexion with dowel wide grip 2x10 Supine shoulder abduction AAROM with dowel x10 (painful at about 40 degrees, anterior shoulder) Supine Elbow flex/ext 1000g ball LUE  2x15 Neuromuscular re-ed: Rows RTB 2x10 Shoulder extension YTB x10, x5 (painful) ER/IR walkouts 2x5 ea YTB   [facility] Adult PT Treatment:                                                DATE: 03/07/24 Therapeutic Exercise: Pulleys flexion/scaption x2' ea Supine chest press with 2.5# dowel 2x10 Supine shoulder flexion with dowel wide grip 2x10 Supine shoulder abduction AAROM with dowel x10 (pain) Supine Elbow flex/ext 500g ball LUE 2x15 Neuromuscular re-ed: Rows RTB 2x10 Shoulder extension YTB x8 (too painful) ER/IR walkouts x5 ea YTB LUE only Manual Therapy PROM all directions with gentle oscillations at end range STM anterior deltoid, pec, bicep Grades 1-3 inferior glide      PATIENT EDUCATION: Education details: reviewed initial home exercise program; discussion of POC, prognosis and goals for skilled PT   Person educated: Patient Education method: Explanation, Demonstration, and Handouts Education comprehension: verbalized understanding, returned demonstration, and needs further education  HOME EXERCISE PROGRAM: Access Code: [email or web address] URL: [email or web address] Date: 02/08/2024 Prepared by: Marko Molt  Exercises - Seated Bilateral Shoulder Flexion Towel Slide at Table Top  - 2 x daily - 2 sets - 10 reps - 3 sec hold - Seated Shoulder Abduction Towel Slide at Table Top  - 2 x daily - 2 sets - 10 reps - 3 sec hold - Isometric Shoulder Flexion at Wall  - 2 x daily - 2 sets - 10 reps - 5 sec hold - Isometric Shoulder Abduction at Wall  - 2 x daily - 2 sets - 10 reps - 5 sec hold - Isometric Shoulder Extension at Wall  - 2 x daily - 2 sets - 10 reps - 5 sec hold - Isometric  Shoulder Adduction  - 2 x daily - 2 sets - 10 reps - 5 sec hold  ASSESSMENT:  CLINICAL IMPRESSION: ***  03/28/2024 Patient has attended 5 PT sessions to address L shoulder pain with mobility deficits with related ACJ separation. She is demonstrating good improvement of shoulder flexion AROM and overhead reaching tolerance. However, she continues to have difficulty with shoulder abduction above 90 degrees, reaching behind. She requires ongoing skilled PT intervention in order to address remaining deficits and progress towards functional rehab goals. Plan to continue with PT 1-2x/week for 6 weeks.    EVAL: Kim [name] is a 52 y.o. female who was seen today for physical therapy evaluation and treatment for L shoulder pain with mobility deficits, with related AC separation. She is demonstrating diminished L UE MMT scores, and pain with overhead reaching. She has related pain and difficulty with upper body dressing, heavy lifting, and diminished sleep quality d/t pain when laying on L side.. She requires skilled PT services at this time to address relevant deficits and improve overall function.     OBJECTIVE IMPAIRMENTS: decreased activity tolerance, decreased knowledge of condition, decreased ROM, decreased strength, impaired UE functional use, and pain.   ACTIVITY LIMITATIONS: carrying, lifting, sleeping, bathing, dressing, reach over head, and hygiene/grooming  PARTICIPATION LIMITATIONS: meal prep, cleaning, laundry, interpersonal relationship, driving, shopping, and community activity  PERSONAL FACTORS: 3+ comorbidities: Relevant PMHx includes Pulmonary embolism, Renal insufficiency, Polyarthralgia, cerebral aneurysm  are also affecting patient's functional outcome.   REHAB POTENTIAL: Good  CLINICAL DECISION MAKING: Stable/uncomplicated  EVALUATION COMPLEXITY: Low   GOALS: Goals reviewed with patient? YES  SHORT TERM  GOALS: Target date: 02/29/2024   Patient will be independent with  initial home program at least 3 days/week.  Baseline: provided at eval Goal Status: MET Pt reports adherence 03/05/24    2.  Patient will demonstrate 10-15 degree improvement in L shoulder elevation. Baseline: 146deg shoulder flexion, 90deg shoulder abduction  03/26/24: 165deg shoulder flexion, 90 deg shoulder abduction in seated  Goal status: Ongoing   LONG TERM GOALS: Target date: 05/07/2024, last updated 03/26/2024    Patient will report improved overall functional ability with QuickDASH score of 30 or less.  Baseline: 68.2 03/26/24: 31.8 Goal Status: nearly met    2.  Patient will demonstrate improved L shoulder MMT scores of 4+/5 or greater.  Baseline: see objective measures  Goal status: ongoing, limited by pain   3.  Patient will report ability to perform all ADLs, including Upper Body Dressing without pain or difficulty  Baseline: moderately limited by pain and ROM deficits Goal status: ongoing   4.  Patient will demonstrate ability to perform floor to waist lifting of at least 25# using appropriate body mechanics and with no more than minimal pain in order to safely perform normal daily/occupational tasks.   Goal Status:ongoing; limited by pain   5.  Patient will demonstrate ability to perform overhead lifting of at least 5# using appropriate body mechanics and with no more than minimal pain in order to safely perform normal daily/occupational tasks.   Goal Status: ongoing, limited by pain      PLAN:  PT FREQUENCY: 1-2x/week  PT DURATION: 6 weeks  PLANNED INTERVENTIONS: 97164- PT Re-evaluation, 97110-Therapeutic exercises, 97530- Therapeutic activity, 97112- Neuromuscular re-education, 97535- Self Care, 02859- Manual therapy, [id number]- Electrical stimulation (unattended), 20560 (1-2 muscles), 20561 (3+ muscles)- Dry Needling, Patient/Family education, Taping, Joint mobilization, Spinal mobilization, Cryotherapy, and Moist heat  PLAN FOR NEXT SESSION: review and updated  HEP as indicated, address L shoulder/periscapular stabilization, patient education regarding management of symptoms, otherwise progress towards established rehab goals as tolerated   Shanda Code, SPTA 03/28/2024 7:59 AM

## 2024-03-29 ENCOUNTER — Ambulatory Visit

## 2024-03-29 ENCOUNTER — Ambulatory Visit: Admitting: Neurology

## 2024-03-29 DIAGNOSIS — M25512 Pain in left shoulder: Secondary | ICD-10-CM | POA: Diagnosis not present

## 2024-03-29 DIAGNOSIS — R202 Paresthesia of skin: Secondary | ICD-10-CM

## 2024-03-29 NOTE — Procedures (Signed)
 [name]  [address] [place], [address]  [place], [place] [address] Tel: [phone] Fax: [phone] Test Date:  03/29/2024  Patient: Kim [name] Everitt Elder DOB: 10-04-1970 Physician: Tonita Blanch, DO  Sex: Female Height: 5' 5 Ref Phys: Awanda [place], [place]  ID#: [id number]   Technician:    History: This is a 53 year old female referred for evaluation of right foot numbness.  NCV & EMG Findings: Electrodiagnostic testing of the right lower extremity and additional studies of the left shows: Bilateral sural and superficial peroneal sensory responses are within normal limits. Bilateral peroneal and tibial motor responses are within normal limits. Bilateral tibial H reflex studies are within normal limits. There is no evidence of active or chronic motor axonal changes affecting any of the tested muscles.  Motor unit configuration and recruitment pattern is within normal limits.  Impression: This is a normal study of the lower extremities.  In particular, there is no evidence of a large fiber sensorimotor polyneuropathy or lumbosacral radiculopathy.    ___________________________ Tonita Blanch, DO    Nerve Conduction Studies   Stim Site NR Peak (ms) Norm Peak (ms) O-P Amp (V) Norm O-P Amp  Left Sup Peroneal Anti Sensory (Ant Lat Mall)  32 C  12 cm    1.7 <4.6 40.7 >4  Right Sup Peroneal Anti Sensory (Ant Lat Mall)  32 C  12 cm    1.8 <4.6 33.9 >4  Left Sural Anti Sensory (Lat Mall)  32 C  Calf    2.7 <4.6 26.3 >4  Right Sural Anti Sensory (Lat Mall)  32 C  Calf    2.3 <4.6 24.3 >4     Stim Site NR Onset (ms) Norm Onset (ms) O-P Amp (mV) Norm O-P Amp Site1 Site2 Delta-0 (ms) Dist (cm) Vel (m/s) Norm Vel (m/s)  Left Peroneal Motor (Ext Dig Brev)  32 C  Ankle    3.1 <6.0 5.0 >2.5 B Fib Ankle 6.1 34.0 56 >40  B Fib    9.2  4.5  Poplt B Fib 1.3 8.0 62 >40  Poplt    10.5  4.5         Right Peroneal Motor (Ext Dig Brev)  32 C  Ankle    2.7 <6.0 4.1 >2.5 B  Fib Ankle 6.5 33.0 51 >40  B Fib    9.2  3.9  Poplt B Fib 1.3 8.0 62 >40  Poplt    10.5  3.8         Left Tibial Motor (Abd Hall Brev)  32 C  Ankle    3.3 <6.0 11.2 >4 Knee Ankle 9.1 41.0 45 >40  Knee    12.4  8.3         Right Tibial Motor (Abd Hall Brev)  32 C  Ankle    3.4 <6.0 8.0 >4 Knee Ankle 7.5 39.0 52 >40  Knee    10.9  5.5          Electromyography   Side Muscle Ins.Act Fibs Fasc Recrt Amp Dur Poly Activation Comment  Right AntTibialis Nml Nml Nml Nml Nml Nml Nml Nml [name]  Right Gastroc Nml Nml Nml Nml Nml Nml Nml Nml [name]  Right Flex Dig Long Nml Nml Nml Nml Nml Nml Nml Nml [name]  Right RectFemoris Nml Nml Nml Nml Nml Nml Nml Nml [name]  Right BicepsFemS Nml Nml Nml Nml Nml Nml Nml Nml [name]  Right GluteusMed Nml Nml Nml Nml Nml Nml Nml Nml [name]  Left AntTibialis Nml Nml Nml Nml Nml Nml Nml Nml [name]  Left Gastroc Nml Nml Nml Nml Nml Nml Nml Nml [name]  Left Flex Dig Long Nml Nml Nml Nml Nml Nml Nml Nml [name]  Left RectFemoris Nml Nml Nml Nml Nml Nml Nml Nml [name]  Left GluteusMed Nml Nml Nml Nml Nml Nml Nml Nml [name]  Left BicepsFemS Nml Nml Nml Nml Nml Nml Nml Nml [name]      Waveforms:

## 2024-03-29 NOTE — Therapy (Signed)
 OUTPATIENT PHYSICAL THERAPY NOTE   Patient Name: [facility] Elder MRN: [id number] DOB:[birth year], 53 y.o., female Today's Date: 03/29/2024  END OF SESSION:  PT End of Session - 03/29/24 0919     Visit Number 6    Number of Visits 12    Date for Recertification  05/07/24    Authorization Type [employer]    Authorization Time Period 12 visits approved 02/08/24-06/10/24    Authorization - Visit Number 5    Authorization - Number of Visits 12    PT Start Time 0918    PT Stop Time 0958    PT Time Calculation (min) 40 min    Activity Tolerance Patient tolerated treatment well    Behavior During Therapy [facility] for tasks assessed/performed            Past Medical History:  Diagnosis Date   Abnormal Pap smear    patient states she had cancer that was removed from her cervix   Asthma    when pregnant/ once 6 yrs ago only time asthma attack the dr said   Cancer [facility])    Clotting disorder    Constipation    GERD (gastroesophageal reflux disease) 10/19/2015   Gestational diabetes    H. pylori infection    Helicobacter pylori (H. pylori) infection 09/13/2023   Hemorrhoids    History of gestational diabetes    Hx of pulmonary embolus during pregnancy 11/09/2014   July 2016: CTA notable for pulmonary embolus to the posterior right  lower lobe. Provoked given pregnancy.     Coumadin  until 02/09/15.      Intramural leiomyoma of uterus 02/29/2016   Pulmonary embolism (HCC)    Renal insufficiency    TMJ (sprain of temporomandibular joint), initial encounter 12/29/2020   Vitamin D  deficiency    Vitamin D  deficiency    Past Surgical History:  Procedure Laterality Date   CHOLECYSTECTOMY     Patient Active Problem List   Diagnosis Date Noted   Neuropathy of right lateral plantar nerve 01/03/2024   Plantar fasciitis 12/05/2023   Well woman exam with routine gynecological exam 11/02/2023   Polyarthralgia 08/08/2023   Anxiety disorder due to general medical condition with  panic attack 01/31/2020   Cerebral aneurysm without rupture 01/31/2020   Intramural leiomyoma of uterus 02/29/2016   History of pulmonary embolism 02/24/2015    PCP: [name], MD  REFERRING PROVIDER:  Valdemar Rocky SAUNDERS, NP     REFERRING DIAG: Chronic left shoulder pain [M25.512, G89.29]   THERAPY DIAG:  Left shoulder pain, unspecified chronicity  Rationale for Evaluation and Treatment: Rehabilitation  ONSET DATE: 1 month  SUBJECTIVE:  SUBJECTIVE STATEMENT: In-person interpreter was present throughout today's session.   Patient reports that her pain is a little better today. She rates her pain at 6/10.   EVAL: Patient reports that her L shoulder began with a mild pain for a few days. Then, one day it hurt so bad that I couldn't really move it. The day before she was lifting some cases of water. She states that it has been about 3 weeks to 1 months.    Hand dominance: Right  PERTINENT HISTORY: Relevant PMHx includes Pulmonary embolism, Renal insufficiency, Polyarthralgia, cerebral aneurysm   PAIN:  Are you having pain?  Yes: NPRS scale: 3/10 current, 10/10 worst  Pain location: L shoulder (anterior)  Pain description: popping/clicking,  Aggravating factors: Upper body dressing, lifting heavy things, sleeping on L side Relieving factors: I don't know, also taking pain medicine -- unsure if it's helping   PRECAUTIONS: None  RED FLAGS: None   WEIGHT BEARING RESTRICTIONS: No  FALLS:  Has patient fallen in last 6 months? No  However, she did have a fall about 1 year ago, when she hit R side of head, but doesn't remember the details of how she fell  LIVING ENVIRONMENT: Lives with: lives with their family and lives with their spouse Lives in: House/apartment  OCCUPATION: [name]    PLOF: Independent  PATIENT GOALS:to have less pain, to be able to move my left arm normally   NEXT MD VISIT: 02/09/2024 Neurology, 03/05/2024 PCP  OBJECTIVE:  Note: Objective measures were completed at Evaluation unless otherwise noted.  DIAGNOSTIC FINDINGS:  01/18/2024: IMPRESSION: Mild elevation of the LEFT clavicle relative to the acromion is suspicious for [facility] joint separation of uncertain chronicity.  PATIENT SURVEYS:  Quick Dash:  QUICK DASH  Please rate your ability do the following activities in the last week by selecting the number below the appropriate response.   Activities Rating  Open a tight or new jar.  4 = Severe difficulty  Do heavy household chores (e.g., wash walls, floors). 4 = Severe difficulty  Carry a shopping bag or briefcase 5 = Unable  Wash your back. 4 = Severe difficulty  Use a knife to cut food. 4 = Severe difficulty  Recreational activities in which you take some force or impact through your arm, shoulder or hand (e.g., golf, hammering, tennis, etc.). 5 = Unable  During the past week, to what extent has your arm, shoulder or hand problem interfered with your normal social activities with family, friends, neighbors or groups?  3 = Moderately  During the past week, were you limited in your work or other regular daily activities as a result of your arm, shoulder or hand problem? 3 = Moderately limited  Rate the severity of the following symptoms in the last week: Arm, Shoulder, or hand pain. 3 = Moderate  Rate the severity of the following symptoms in the last week: Tingling (pins and needles) in your arm, shoulder or hand. 2 = Mild  During the past week, how much difficulty have you had sleeping because of the pain in your arm, shoulder or hand?  4 = Severe difficulty   (A QuickDASH score may not be calculated if there is greater than 1 missing item.)  Quick Dash Disability/Symptom Score: 68.2  Minimally Clinically Important Difference (MCID):  15-20 points  (Franchignoni, F. et al. (2013). Minimally clinically important difference of the disabilities of the arm, shoulder, and hand outcome measures (DASH) and its shortened version (Quick DASH). Journal of Orthopaedic &  Sports Physical Therapy, 44(1), 30-39)   COGNITION: Overall cognitive status: Within functional limits for tasks assessed     SENSATION: Not tested   UPPER EXTREMITY ROM:   Active ROM Right eval Left eval  Shoulder flexion  146  Shoulder extension    Shoulder abduction  90  Shoulder adduction    Shoulder internal rotation  WFL  Shoulder external rotation  WFL   Elbow flexion    Elbow extension    Wrist flexion    Wrist extension    Wrist ulnar deviation    Wrist radial deviation    Wrist pronation    Wrist supination    (Blank rows = not tested)  UPPER EXTREMITY MMT:  MMT Right eval Left eval  Shoulder flexion 5 3+  Shoulder extension    Shoulder abduction 5 3+  Shoulder adduction    Shoulder internal rotation  4-  Shoulder external rotation  3+  Middle trapezius    Lower trapezius    Elbow flexion  4  Elbow extension  4  Wrist flexion    Wrist extension    Wrist ulnar deviation    Wrist radial deviation    Wrist pronation    Wrist supination    Grip strength (lbs)    (Blank rows = not tested)                                                                                                                              TREATMENT DATE:   [facility] Adult PT Treatment:                                                DATE: 03/29/24 Therapeutic Exercise: UBE fwd/bwd 2 min ea level 2 Supine PROM flexion, scaption, abduction  Supine shoulder flexion with dowel wide grip 2x10 Supine shoulder abduction AAROM with dowel x10  Supine Elbow flex/ext 1000g ball LUE 2x15 Updated and reviewed HEP   Neuromuscular re-ed: Rows green TB 2x15 Shoulder extension YTB 2x10 PNF  Resisted shoulder adduction from 45 degrees From 65-75 degrees Supine  isometrics at 90 degrees  Reports some burning pain, mild resistance  [facility] Adult PT Treatment:                                                DATE: 03/26/24  UBE fwd/bwd 3 min ea, lvl 2  Pulleys flexion/scaption x2'  Rows RTB x15 Rows GTB x 10 ER/IR walkouts x10 ea YTB   Therapeutic Activity:  Reassessment of objective measures and subjective assessment regarding progress towards established goals and updated plan for addressing remaining deficits and rehab goals.    [facility] Adult PT Treatment:  DATE: 03/21/24 Therapeutic Exercise: UBE fwd/bwd 2 min ea Pulleys flexion/scaption x2' ea Supine shoulder flexion with dowel wide grip 2x10 Supine shoulder abduction AAROM with dowel x10 (painful at about 40 degrees, anterior shoulder) Supine Elbow flex/ext 1000g ball LUE 2x15 Neuromuscular re-ed: Rows RTB 2x10 Shoulder extension YTB x10, x5 (painful) ER/IR walkouts 2x5 ea YTB       PATIENT EDUCATION: Education details: reviewed initial home exercise program; discussion of POC, prognosis and goals for skilled PT   Person educated: Patient Education method: Explanation, Demonstration, and Handouts Education comprehension: verbalized understanding, returned demonstration, and needs further education  HOME EXERCISE PROGRAM: Access Code: [email or web address] URL: [email or web address] Date: 03/29/2024 Prepared by: Marko Molt  Exercises - Scaption Wall Slide with Towel  - 1 x daily - 2 sets - 10 reps - 3 sec hold - Seated Shoulder Abduction Towel Slide at Table Top  - 1 x daily - 2 sets - 10 reps - 3 sec hold - Isometric Shoulder Flexion at Wall  - 1 x daily - 1 sets - 10-15 reps - 5 sec hold - Isometric Shoulder Abduction at Wall  - 1 x daily - 1 sets - 10-15 reps - 5 sec hold - Isometric Shoulder Extension at Wall  - 1 x daily - 1 sets - 10-15 reps - 5 sec hold - Isometric Shoulder Adduction  - 1 x daily - 1 sets - 10-15 reps - 5 sec  hold  ASSESSMENT:  CLINICAL IMPRESSION: 03/29/2024 Patient had improved tolerance of activities today. She was able to increased repetitions of resisted shoulder activities. She will continue to benefit from [facility] and manual resistance, utilizing PNF techniques. Encouraged patient to resume shoulder isometrics at next visit.   EVAL: Khylei is a 53 y.o. female who was seen today for physical therapy evaluation and treatment for L shoulder pain with mobility deficits, with related AC separation. She is demonstrating diminished L UE MMT scores, and pain with overhead reaching. She has related pain and difficulty with upper body dressing, heavy lifting, and diminished sleep quality d/t pain when laying on L side.. She requires skilled PT services at this time to address relevant deficits and improve overall function.     OBJECTIVE IMPAIRMENTS: decreased activity tolerance, decreased knowledge of condition, decreased ROM, decreased strength, impaired UE functional use, and pain.   ACTIVITY LIMITATIONS: carrying, lifting, sleeping, bathing, dressing, reach over head, and hygiene/grooming  PARTICIPATION LIMITATIONS: meal prep, cleaning, laundry, interpersonal relationship, driving, shopping, and community activity  PERSONAL FACTORS: 3+ comorbidities: Relevant PMHx includes Pulmonary embolism, Renal insufficiency, Polyarthralgia, cerebral aneurysm  are also affecting patient's functional outcome.   REHAB POTENTIAL: Good  CLINICAL DECISION MAKING: Stable/uncomplicated  EVALUATION COMPLEXITY: Low   GOALS: Goals reviewed with patient? YES  SHORT TERM GOALS: Target date: 02/29/2024   Patient will be independent with initial home program at least 3 days/week.  Baseline: provided at eval Goal Status: MET Pt reports adherence 03/05/24    2.  Patient will demonstrate 10-15 degree improvement in L shoulder elevation. Baseline: 146deg shoulder flexion, 90deg shoulder abduction  03/26/24: 165deg  shoulder flexion, 90 deg shoulder abduction in seated  Goal status: Ongoing   LONG TERM GOALS: Target date: 05/07/2024, last updated 03/26/2024    Patient will report improved overall functional ability with QuickDASH score of 30 or less.  Baseline: 68.2 03/26/24: 31.8 Goal Status: nearly met    2.  Patient will demonstrate improved L shoulder MMT scores of 4+/5 or greater.  Baseline: see objective measures  Goal status: ongoing, limited by pain   3.  Patient will report ability to perform all ADLs, including Upper Body Dressing without pain or difficulty  Baseline: moderately limited by pain and ROM deficits Goal status: ongoing   4.  Patient will demonstrate ability to perform floor to waist lifting of at least 25# using appropriate body mechanics and with no more than minimal pain in order to safely perform normal daily/occupational tasks.   Goal Status:ongoing; limited by pain   5.  Patient will demonstrate ability to perform overhead lifting of at least 5# using appropriate body mechanics and with no more than minimal pain in order to safely perform normal daily/occupational tasks.   Goal Status: ongoing, limited by pain      PLAN:  PT FREQUENCY: 1-2x/week  PT DURATION: 6 weeks  PLANNED INTERVENTIONS: 97164- PT Re-evaluation, 97110-Therapeutic exercises, 97530- Therapeutic activity, 97112- Neuromuscular re-education, 97535- Self Care, [id number]- Manual therapy, [id number]- Electrical stimulation (unattended), 20560 (1-2 muscles), 20561 (3+ muscles)- Dry Needling, Patient/Family education, Taping, Joint mobilization, Spinal mobilization, Cryotherapy, and Moist heat  PLAN FOR NEXT SESSION: review and updated HEP as indicated, address L shoulder/periscapular stabilization, patient education regarding management of symptoms, otherwise progress towards established rehab goals as tolerated   Marko Molt, PT, DPT  03/29/2024 10:09 AM

## 2024-04-02 ENCOUNTER — Ambulatory Visit

## 2024-04-02 DIAGNOSIS — M25512 Pain in left shoulder: Secondary | ICD-10-CM

## 2024-04-02 DIAGNOSIS — M6281 Muscle weakness (generalized): Secondary | ICD-10-CM

## 2024-04-02 NOTE — Therapy (Signed)
 OUTPATIENT PHYSICAL THERAPY NOTE   Patient Name: [facility] Everitt Elder MRN: [id number] DOB:1970/11/18, 53 y.o., female Today's Date: 04/02/2024  END OF SESSION:  PT End of Session - 04/02/24 1055     Visit Number 7    Number of Visits 12    Date for Recertification  05/07/24    Authorization Type [employer]    Authorization Time Period 12 visits approved 02/08/24-06/10/24    Authorization - Visit Number 6    Authorization - Number of Visits 12    PT Start Time 1054   Pt arrived late   PT Stop Time 1124    PT Time Calculation (min) 30 min    Activity Tolerance Patient tolerated treatment well    Behavior During Therapy [facility] for tasks assessed/performed             Past Medical History:  Diagnosis Date   Abnormal Pap smear    patient states she had cancer that was removed from her cervix   Asthma    when pregnant/ once 6 yrs ago only time asthma attack the dr said   Cancer [facility])    Clotting disorder    Constipation    GERD (gastroesophageal reflux disease) 10/19/2015   Gestational diabetes    H. pylori infection    Helicobacter pylori (H. pylori) infection 09/13/2023   Hemorrhoids    History of gestational diabetes    Hx of pulmonary embolus during pregnancy 11/09/2014   July 2016: CTA notable for pulmonary embolus to the posterior right  lower lobe. Provoked given pregnancy.     Coumadin  until 02/09/15.      Intramural leiomyoma of uterus 02/29/2016   Pulmonary embolism (HCC)    Renal insufficiency    TMJ (sprain of temporomandibular joint), initial encounter 12/29/2020   Vitamin D  deficiency    Vitamin D  deficiency    Past Surgical History:  Procedure Laterality Date   CHOLECYSTECTOMY     Patient Active Problem List   Diagnosis Date Noted   Neuropathy of right lateral plantar nerve 01/03/2024   Plantar fasciitis 12/05/2023   Well woman exam with routine gynecological exam 11/02/2023   Polyarthralgia 08/08/2023   Anxiety disorder due to general  medical condition with panic attack 01/31/2020   Cerebral aneurysm without rupture 01/31/2020   Intramural leiomyoma of uterus 02/29/2016   History of pulmonary embolism 02/24/2015    PCP: [name], MD  REFERRING PROVIDER:  Valdemar Rocky SAUNDERS, NP     REFERRING DIAG: Chronic left shoulder pain [M25.512, G89.29]   THERAPY DIAG:  Left shoulder pain, unspecified chronicity  Muscle weakness (generalized)  Rationale for Evaluation and Treatment: Rehabilitation  ONSET DATE: 1 month  SUBJECTIVE:  SUBJECTIVE STATEMENT: In-person interpreter was present throughout today's session.   Patient reports that her shoulder feels better today, felt ok after last session, rates pain at 6/10 today.   EVAL: Patient reports that her L shoulder began with a mild pain for a few days. Then, one day it hurt so bad that I couldn't really move it. The day before she was lifting some cases of water. She states that it has been about 3 weeks to 1 months.    Hand dominance: Right  PERTINENT HISTORY: Relevant PMHx includes Pulmonary embolism, Renal insufficiency, Polyarthralgia, cerebral aneurysm   PAIN:  Are you having pain?  Yes: NPRS scale: 3/10 current, 10/10 worst  Pain location: L shoulder (anterior)  Pain description: popping/clicking,  Aggravating factors: Upper body dressing, lifting heavy things, sleeping on L side Relieving factors: I don't know, also taking pain medicine -- unsure if it's helping   PRECAUTIONS: None  RED FLAGS: None   WEIGHT BEARING RESTRICTIONS: No  FALLS:  Has patient fallen in last 6 months? No  However, she did have a fall about 1 year ago, when she hit R side of head, but doesn't remember the details of how she fell  LIVING ENVIRONMENT: Lives with: lives with their family  and lives with their spouse Lives in: House/apartment  OCCUPATION: [name]   PLOF: Independent  PATIENT GOALS:to have less pain, to be able to move my left arm normally   NEXT MD VISIT: 02/09/2024 Neurology, 03/05/2024 PCP  OBJECTIVE:  Note: Objective measures were completed at Evaluation unless otherwise noted.  DIAGNOSTIC FINDINGS:  01/18/2024: IMPRESSION: Mild elevation of the LEFT clavicle relative to the acromion is suspicious for [facility] joint separation of uncertain chronicity.  PATIENT SURVEYS:  Quick Dash:  QUICK DASH  Please rate your ability do the following activities in the last week by selecting the number below the appropriate response.   Activities Rating  Open a tight or new jar.  4 = Severe difficulty  Do heavy household chores (e.g., wash walls, floors). 4 = Severe difficulty  Carry a shopping bag or briefcase 5 = Unable  Wash your back. 4 = Severe difficulty  Use a knife to cut food. 4 = Severe difficulty  Recreational activities in which you take some force or impact through your arm, shoulder or hand (e.g., golf, hammering, tennis, etc.). 5 = Unable  During the past week, to what extent has your arm, shoulder or hand problem interfered with your normal social activities with family, friends, neighbors or groups?  3 = Moderately  During the past week, were you limited in your work or other regular daily activities as a result of your arm, shoulder or hand problem? 3 = Moderately limited  Rate the severity of the following symptoms in the last week: Arm, Shoulder, or hand pain. 3 = Moderate  Rate the severity of the following symptoms in the last week: Tingling (pins and needles) in your arm, shoulder or hand. 2 = Mild  During the past week, how much difficulty have you had sleeping because of the pain in your arm, shoulder or hand?  4 = Severe difficulty   (A QuickDASH score may not be calculated if there is greater than 1 missing item.)  Quick Dash  Disability/Symptom Score: 68.2  Minimally Clinically Important Difference (MCID): 15-20 points  (Franchignoni, F. et al. (2013). Minimally clinically important difference of the disabilities of the arm, shoulder, and hand outcome measures (DASH) and its shortened version (Quick DASH). Journal  of Orthopaedic & Sports Physical Therapy, 44(1), 30-39)   COGNITION: Overall cognitive status: Within functional limits for tasks assessed     SENSATION: Not tested   UPPER EXTREMITY ROM:   Active ROM Right eval Left eval  Shoulder flexion  146  Shoulder extension    Shoulder abduction  90  Shoulder adduction    Shoulder internal rotation  WFL  Shoulder external rotation  WFL   Elbow flexion    Elbow extension    Wrist flexion    Wrist extension    Wrist ulnar deviation    Wrist radial deviation    Wrist pronation    Wrist supination    (Blank rows = not tested)  UPPER EXTREMITY MMT:  MMT Right eval Left eval  Shoulder flexion 5 3+  Shoulder extension    Shoulder abduction 5 3+  Shoulder adduction    Shoulder internal rotation  4-  Shoulder external rotation  3+  Middle trapezius    Lower trapezius    Elbow flexion  4  Elbow extension  4  Wrist flexion    Wrist extension    Wrist ulnar deviation    Wrist radial deviation    Wrist pronation    Wrist supination    Grip strength (lbs)    (Blank rows = not tested)                                                                                                                              TREATMENT DATE:  [facility] Adult PT Treatment:                                                DATE: 04/02/24 Therapeutic Exercise: UBE fwd/bwd 3 min ea level 2 Supine PROM flexion, scaption, abduction, ER/IR Supine shoulder flexion with dowel wide grip 2x10 Supine shoulder abduction AAROM with dowel x10  Supine Elbow flex/ext 1000g ball LUE 2x15 Neuromuscular re-ed: Rows green TB 2x15 Shoulder extension YTBx10 PNF  Resisted shoulder  adduction from 45 degrees From 65-75 degrees Supine isometrics at 90 degrees  Reports some burning pain, mild resistance (  [facility] Adult PT Treatment:                                                DATE: 03/29/24 Therapeutic Exercise: UBE fwd/bwd 2 min ea level 2 Supine PROM flexion, scaption, abduction  Supine shoulder flexion with dowel wide grip 2x10 Supine shoulder abduction AAROM with dowel x10  Supine Elbow flex/ext 1000g ball LUE 2x15 Updated and reviewed HEP   Neuromuscular re-ed: Rows green TB 2x15 Shoulder extension YTB 2x10 PNF  Resisted shoulder adduction from 45 degrees From 65-75 degrees Supine  isometrics at 90 degrees  Reports some burning pain, mild resistance  [facility] Adult PT Treatment:                                                DATE: 03/26/24  UBE fwd/bwd 3 min ea, lvl 2  Pulleys flexion/scaption x2'  Rows RTB x15 Rows GTB x 10 ER/IR walkouts x10 ea YTB   Therapeutic Activity:  Reassessment of objective measures and subjective assessment regarding progress towards established goals and updated plan for addressing remaining deficits and rehab goals.     PATIENT EDUCATION: Education details: reviewed initial home exercise program; discussion of POC, prognosis and goals for skilled PT   Person educated: Patient Education method: Explanation, Demonstration, and Handouts Education comprehension: verbalized understanding, returned demonstration, and needs further education  HOME EXERCISE PROGRAM: Access Code: [email or web address] URL: [email or web address] Date: 03/29/2024 Prepared by: Marko Molt  Exercises - Scaption Wall Slide with Towel  - 1 x daily - 2 sets - 10 reps - 3 sec hold - Seated Shoulder Abduction Towel Slide at Table Top  - 1 x daily - 2 sets - 10 reps - 3 sec hold - Isometric Shoulder Flexion at Wall  - 1 x daily - 1 sets - 10-15 reps - 5 sec hold - Isometric Shoulder Abduction at Wall  - 1 x daily - 1 sets - 10-15 reps - 5 sec  hold - Isometric Shoulder Extension at Wall  - 1 x daily - 1 sets - 10-15 reps - 5 sec hold - Isometric Shoulder Adduction  - 1 x daily - 1 sets - 10-15 reps - 5 sec hold  ASSESSMENT:  CLINICAL IMPRESSION: Patient presents to PT reporting improvements in her pain today and that she felt ok after last session. Session today continued to focus on resisted shoulder activities, isometrics, and use of manual resistance and PNF techniques. Patient was able to tolerate all prescribed exercises with no adverse effects. Patient continues to benefit from skilled PT services and should be progressed as able to improve functional independence.   EVAL: Chezney is a 53 y.o. female who was seen today for physical therapy evaluation and treatment for L shoulder pain with mobility deficits, with related AC separation. She is demonstrating diminished L UE MMT scores, and pain with overhead reaching. She has related pain and difficulty with upper body dressing, heavy lifting, and diminished sleep quality d/t pain when laying on L side.. She requires skilled PT services at this time to address relevant deficits and improve overall function.     OBJECTIVE IMPAIRMENTS: decreased activity tolerance, decreased knowledge of condition, decreased ROM, decreased strength, impaired UE functional use, and pain.   ACTIVITY LIMITATIONS: carrying, lifting, sleeping, bathing, dressing, reach over head, and hygiene/grooming  PARTICIPATION LIMITATIONS: meal prep, cleaning, laundry, interpersonal relationship, driving, shopping, and community activity  PERSONAL FACTORS: 3+ comorbidities: Relevant PMHx includes Pulmonary embolism, Renal insufficiency, Polyarthralgia, cerebral aneurysm  are also affecting patient's functional outcome.   REHAB POTENTIAL: Good  CLINICAL DECISION MAKING: Stable/uncomplicated  EVALUATION COMPLEXITY: Low   GOALS: Goals reviewed with patient? YES  SHORT TERM GOALS: Target date:  02/29/2024   Patient will be independent with initial home program at least 3 days/week.  Baseline: provided at eval Goal Status: MET Pt reports adherence 03/05/24    2.  Patient will demonstrate 10-15 degree improvement in L  shoulder elevation. Baseline: 146deg shoulder flexion, 90deg shoulder abduction  03/26/24: 165deg shoulder flexion, 90 deg shoulder abduction in seated  Goal status: Ongoing   LONG TERM GOALS: Target date: 05/07/2024, last updated 03/26/2024   Patient will report improved overall functional ability with QuickDASH score of 30 or less.  Baseline: 68.2 03/26/24: 31.8 Goal Status: nearly met    2.  Patient will demonstrate improved L shoulder MMT scores of 4+/5 or greater.  Baseline: see objective measures  Goal status: ongoing, limited by pain   3.  Patient will report ability to perform all ADLs, including Upper Body Dressing without pain or difficulty  Baseline: moderately limited by pain and ROM deficits Goal status: ongoing   4.  Patient will demonstrate ability to perform floor to waist lifting of at least 25# using appropriate body mechanics and with no more than minimal pain in order to safely perform normal daily/occupational tasks.   Goal Status:ongoing; limited by pain   5.  Patient will demonstrate ability to perform overhead lifting of at least 5# using appropriate body mechanics and with no more than minimal pain in order to safely perform normal daily/occupational tasks.   Goal Status: ongoing, limited by pain      PLAN:  PT FREQUENCY: 1-2x/week  PT DURATION: 6 weeks  PLANNED INTERVENTIONS: 97164- PT Re-evaluation, 97110-Therapeutic exercises, 97530- Therapeutic activity, 97112- Neuromuscular re-education, 97535- Self Care, [id number]- Manual therapy, [id number]- Electrical stimulation (unattended), 20560 (1-2 muscles), 20561 (3+ muscles)- Dry Needling, Patient/Family education, Taping, Joint mobilization, Spinal mobilization, Cryotherapy, and Moist  heat  PLAN FOR NEXT SESSION: review and updated HEP as indicated, address L shoulder/periscapular stabilization, patient education regarding management of symptoms, otherwise progress towards established rehab goals as tolerated   Corean Pouch PTA  04/02/2024 11:25 AM

## 2024-04-04 ENCOUNTER — Ambulatory Visit

## 2024-04-04 DIAGNOSIS — M5459 Other low back pain: Secondary | ICD-10-CM

## 2024-04-04 DIAGNOSIS — M25512 Pain in left shoulder: Secondary | ICD-10-CM | POA: Diagnosis not present

## 2024-04-04 DIAGNOSIS — M6281 Muscle weakness (generalized): Secondary | ICD-10-CM

## 2024-04-04 DIAGNOSIS — G8929 Other chronic pain: Secondary | ICD-10-CM

## 2024-04-04 DIAGNOSIS — M25571 Pain in right ankle and joints of right foot: Secondary | ICD-10-CM

## 2024-04-04 NOTE — Therapy (Signed)
 OUTPATIENT PHYSICAL THERAPY NOTE   Patient Name: [facility] Everitt Elder MRN: [id number] DOB:1971/03/14, 53 y.o., female Today's Date: 04/04/2024  END OF SESSION:  PT End of Session - 04/04/24 1055     Visit Number 8    Number of Visits 12    Date for Recertification  05/07/24    Authorization Type [employer]    Authorization Time Period 12 visits approved 02/08/24-06/10/24    Authorization - Visit Number 7    Authorization - Number of Visits 12    PT Start Time 1050    PT Stop Time 1128    PT Time Calculation (min) 38 min    Activity Tolerance Patient tolerated treatment well    Behavior During Therapy WFL for tasks assessed/performed              Past Medical History:  Diagnosis Date   Abnormal Pap smear    patient states she had cancer that was removed from her cervix   Asthma    when pregnant/ once 6 yrs ago only time asthma attack the dr said   Cancer [facility])    Clotting disorder    Constipation    GERD (gastroesophageal reflux disease) 10/19/2015   Gestational diabetes    H. pylori infection    Helicobacter pylori (H. pylori) infection 09/13/2023   Hemorrhoids    History of gestational diabetes    Hx of pulmonary embolus during pregnancy 11/09/2014   July 2016: CTA notable for pulmonary embolus to the posterior right  lower lobe. Provoked given pregnancy.     Coumadin  until 02/09/15.      Intramural leiomyoma of uterus 02/29/2016   Pulmonary embolism (HCC)    Renal insufficiency    TMJ (sprain of temporomandibular joint), initial encounter 12/29/2020   Vitamin D  deficiency    Vitamin D  deficiency    Past Surgical History:  Procedure Laterality Date   CHOLECYSTECTOMY     Patient Active Problem List   Diagnosis Date Noted   Neuropathy of right lateral plantar nerve 01/03/2024   Plantar fasciitis 12/05/2023   Well woman exam with routine gynecological exam 11/02/2023   Polyarthralgia 08/08/2023   Anxiety disorder due to general medical condition  with panic attack 01/31/2020   Cerebral aneurysm without rupture 01/31/2020   Intramural leiomyoma of uterus 02/29/2016   History of pulmonary embolism 02/24/2015    PCP: [name], MD  REFERRING PROVIDER:  Valdemar Rocky SAUNDERS, NP     REFERRING DIAG: Chronic left shoulder pain [M25.512, G89.29]   THERAPY DIAG:  Left shoulder pain, unspecified chronicity  Muscle weakness (generalized)  Other low back pain  Chronic pain of right knee  Pain in right ankle and joints of right foot  Rationale for Evaluation and Treatment: Rehabilitation  ONSET DATE: 1 month  SUBJECTIVE:  SUBJECTIVE STATEMENT: In-person interpreter was present throughout today's session.   Patient reporting some tenderness from use of towel roll with exercises, and points to L periscapular and cervical pain that was present yesterday.  EVAL: Patient reports that her L shoulder began with a mild pain for a few days. Then, one day it hurt so bad that I couldn't really move it. The day before she was lifting some cases of water. She states that it has been about 3 weeks to 1 months.    Hand dominance: Right  PERTINENT HISTORY: Relevant PMHx includes Pulmonary embolism, Renal insufficiency, Polyarthralgia, cerebral aneurysm   PAIN:  Are you having pain?  Yes: NPRS scale: 3/10 current, 10/10 worst  Pain location: L shoulder (anterior)  Pain description: popping/clicking,  Aggravating factors: Upper body dressing, lifting heavy things, sleeping on L side Relieving factors: I don't know, also taking pain medicine -- unsure if it's helping   PRECAUTIONS: None  RED FLAGS: None   WEIGHT BEARING RESTRICTIONS: No  FALLS:  Has patient fallen in last 6 months? No  However, she did have a fall about 1 year ago, when she hit R  side of head, but doesn't remember the details of how she fell  LIVING ENVIRONMENT: Lives with: lives with their family and lives with their spouse Lives in: House/apartment  OCCUPATION: [name]   PLOF: Independent  PATIENT GOALS:to have less pain, to be able to move my left arm normally   NEXT MD VISIT: 02/09/2024 Neurology, 03/05/2024 PCP  OBJECTIVE:  Note: Objective measures were completed at Evaluation unless otherwise noted.  DIAGNOSTIC FINDINGS:  01/18/2024: IMPRESSION: Mild elevation of the LEFT clavicle relative to the acromion is suspicious for [facility] joint separation of uncertain chronicity.  PATIENT SURVEYS:  Quick Dash:  QUICK DASH  Please rate your ability do the following activities in the last week by selecting the number below the appropriate response.   Activities Rating  Open a tight or new jar.  4 = Severe difficulty  Do heavy household chores (e.g., wash walls, floors). 4 = Severe difficulty  Carry a shopping bag or briefcase 5 = Unable  Wash your back. 4 = Severe difficulty  Use a knife to cut food. 4 = Severe difficulty  Recreational activities in which you take some force or impact through your arm, shoulder or hand (e.g., golf, hammering, tennis, etc.). 5 = Unable  During the past week, to what extent has your arm, shoulder or hand problem interfered with your normal social activities with family, friends, neighbors or groups?  3 = Moderately  During the past week, were you limited in your work or other regular daily activities as a result of your arm, shoulder or hand problem? 3 = Moderately limited  Rate the severity of the following symptoms in the last week: Arm, Shoulder, or hand pain. 3 = Moderate  Rate the severity of the following symptoms in the last week: Tingling (pins and needles) in your arm, shoulder or hand. 2 = Mild  During the past week, how much difficulty have you had sleeping because of the pain in your arm, shoulder or hand?  4 = Severe  difficulty   (A QuickDASH score may not be calculated if there is greater than 1 missing item.)  Quick Dash Disability/Symptom Score: 68.2  Minimally Clinically Important Difference (MCID): 15-20 points  (Franchignoni, F. et al. (2013). Minimally clinically important difference of the disabilities of the arm, shoulder, and hand outcome measures (DASH) and its shortened  version (Quick DASH). Journal of Orthopaedic & Sports Physical Therapy, 44(1), 30-39)   COGNITION: Overall cognitive status: Within functional limits for tasks assessed     SENSATION: Not tested   UPPER EXTREMITY ROM:   Active ROM Right eval Left eval  Shoulder flexion  146  Shoulder extension    Shoulder abduction  90  Shoulder adduction    Shoulder internal rotation  WFL  Shoulder external rotation  WFL   Elbow flexion    Elbow extension    Wrist flexion    Wrist extension    Wrist ulnar deviation    Wrist radial deviation    Wrist pronation    Wrist supination    (Blank rows = not tested)  UPPER EXTREMITY MMT:  MMT Right eval Left eval  Shoulder flexion 5 3+  Shoulder extension    Shoulder abduction 5 3+  Shoulder adduction    Shoulder internal rotation  4-  Shoulder external rotation  3+  Middle trapezius    Lower trapezius    Elbow flexion  4  Elbow extension  4  Wrist flexion    Wrist extension    Wrist ulnar deviation    Wrist radial deviation    Wrist pronation    Wrist supination    Grip strength (lbs)    (Blank rows = not tested)                                                                                                                              TREATMENT DATE:   [facility] Adult PT Treatment:                                                DATE: 04/04/24  Manual Therapy  STM along L periscapular, UT, L CS mm   Therapeutic Exercise: UBE fwd/bwd 2 min ea level 3 Supine PROM flexion, scaption, abduction, ER/IR Supine shoulder flexion with dowel wide grip  2x10  Neuromuscular re-ed: Rows red TB 2x15 Shoulder extension YTBx10 Resisted shoulder adduction red TB  Seated Shrugs with 3#   [facility] Adult PT Treatment:                                                DATE: 04/02/24 Therapeutic Exercise: UBE fwd/bwd 3 min ea level 2 Supine PROM flexion, scaption, abduction, ER/IR Supine shoulder flexion with dowel wide grip 2x10 Supine shoulder abduction AAROM with dowel x10  Supine Elbow flex/ext 1000g ball LUE 2x15 Neuromuscular re-ed: Rows green TB 2x15 Shoulder extension YTBx10 PNF  Resisted shoulder adduction from 45 degrees From 65-75 degrees Supine isometrics at 90 degrees  Reports some burning pain, mild resistance (  [facility] Adult  PT Treatment:                                                DATE: 03/29/24 Therapeutic Exercise: UBE fwd/bwd 2 min ea level 2 Supine PROM flexion, scaption, abduction  Supine shoulder flexion with dowel wide grip 2x10 Supine shoulder abduction AAROM with dowel x10  Supine Elbow flex/ext 1000g ball LUE 2x15 Updated and reviewed HEP   Neuromuscular re-ed: Rows green TB 2x15 Shoulder extension YTB 2x10 PNF  Resisted shoulder adduction from 45 degrees From 65-75 degrees Supine isometrics at 90 degrees  Reports some burning pain, mild resistance  [facility] Adult PT Treatment:                                                DATE: 03/26/24  UBE fwd/bwd 3 min ea, lvl 2  Pulleys flexion/scaption x2'  Rows RTB x15 Rows GTB x 10 ER/IR walkouts x10 ea YTB   Therapeutic Activity:  Reassessment of objective measures and subjective assessment regarding progress towards established goals and updated plan for addressing remaining deficits and rehab goals.     PATIENT EDUCATION: Education details: reviewed initial home exercise program; discussion of POC, prognosis and goals for skilled PT   Person educated: Patient Education method: Explanation, Demonstration, and Handouts Education comprehension: verbalized  understanding, returned demonstration, and needs further education  HOME EXERCISE PROGRAM: Access Code: [email or web address] URL: [email or web address] Date: 04/04/2024 Prepared by: Marko Molt  Exercises - Scaption Wall Slide with Towel  - 1 x daily - 2 sets - 10 reps - 3 sec hold - Isometric Shoulder Flexion at Wall  - 1 x daily - 1 sets - 10-15 reps - 5 sec hold - Isometric Shoulder Abduction at Wall  - 1 x daily - 1 sets - 10-15 reps - 5 sec hold - Isometric Shoulder Extension at Wall  - 1 x daily - 1 sets - 10-15 reps - 5 sec hold - Isometric Shoulder Adduction  - 1 x daily - 1 sets - 10-15 reps - 5 sec hold - Supine Cervical Retraction with Towel  - 2 x daily - 2 sets - 10 reps - 3 sec hold - Seated Shoulder Shrugs  - 2 x daily - 2 sets - 10 reps - 3 sec hold  ASSESSMENT:  CLINICAL IMPRESSION: Patient presents to PT reporting improvements in her pain today and that she felt ok after last session. Session today continued to focus on resisted shoulder activities, isometrics, and use of manual resistance and PNF techniques. Patient was able to tolerate all prescribed exercises with no adverse effects. Patient continues to benefit from skilled PT services and should be progressed as able to improve functional independence.   EVAL: Lavender is a 53 y.o. female who was seen today for physical therapy evaluation and treatment for L shoulder pain with mobility deficits, with related AC separation. She is demonstrating diminished L UE MMT scores, and pain with overhead reaching. She has related pain and difficulty with upper body dressing, heavy lifting, and diminished sleep quality d/t pain when laying on L side.. She requires skilled PT services at this time to address relevant deficits and improve overall function.     OBJECTIVE IMPAIRMENTS: decreased activity tolerance,  decreased knowledge of condition, decreased ROM, decreased strength, impaired UE functional use, and pain.    ACTIVITY LIMITATIONS: carrying, lifting, sleeping, bathing, dressing, reach over head, and hygiene/grooming  PARTICIPATION LIMITATIONS: meal prep, cleaning, laundry, interpersonal relationship, driving, shopping, and community activity  PERSONAL FACTORS: 3+ comorbidities: Relevant PMHx includes Pulmonary embolism, Renal insufficiency, Polyarthralgia, cerebral aneurysm  are also affecting patient's functional outcome.   REHAB POTENTIAL: Good  CLINICAL DECISION MAKING: Stable/uncomplicated  EVALUATION COMPLEXITY: Low   GOALS: Goals reviewed with patient? YES  SHORT TERM GOALS: Target date: 02/29/2024   Patient will be independent with initial home program at least 3 days/week.  Baseline: provided at eval Goal Status: MET Pt reports adherence 03/05/24    2.  Patient will demonstrate 10-15 degree improvement in L shoulder elevation. Baseline: 146deg shoulder flexion, 90deg shoulder abduction  03/26/24: 165deg shoulder flexion, 90 deg shoulder abduction in seated  Goal status: Ongoing   LONG TERM GOALS: Target date: 05/07/2024, last updated 03/26/2024   Patient will report improved overall functional ability with QuickDASH score of 30 or less.  Baseline: 68.2 03/26/24: 31.8 Goal Status: nearly met    2.  Patient will demonstrate improved L shoulder MMT scores of 4+/5 or greater.  Baseline: see objective measures  Goal status: ongoing, limited by pain   3.  Patient will report ability to perform all ADLs, including Upper Body Dressing without pain or difficulty  Baseline: moderately limited by pain and ROM deficits Goal status: ongoing   4.  Patient will demonstrate ability to perform floor to waist lifting of at least 25# using appropriate body mechanics and with no more than minimal pain in order to safely perform normal daily/occupational tasks.   Goal Status:ongoing; limited by pain   5.  Patient will demonstrate ability to perform overhead lifting of at least 5#  using appropriate body mechanics and with no more than minimal pain in order to safely perform normal daily/occupational tasks.   Goal Status: ongoing, limited by pain      PLAN:  PT FREQUENCY: 1-2x/week  PT DURATION: 6 weeks  PLANNED INTERVENTIONS: 97164- PT Re-evaluation, 97110-Therapeutic exercises, 97530- Therapeutic activity, 97112- Neuromuscular re-education, 97535- Self Care, [id number]- Manual therapy, [id number]- Electrical stimulation (unattended), 20560 (1-2 muscles), 20561 (3+ muscles)- Dry Needling, Patient/Family education, Taping, Joint mobilization, Spinal mobilization, Cryotherapy, and Moist heat  PLAN FOR NEXT SESSION: review and updated HEP as indicated, address L shoulder/periscapular stabilization, patient education regarding management of symptoms, otherwise progress towards established rehab goals as tolerated   Marko Molt, PT, DPT  04/04/2024 5:22 PM

## 2024-04-17 ENCOUNTER — Ambulatory Visit

## 2024-04-17 DIAGNOSIS — M25512 Pain in left shoulder: Secondary | ICD-10-CM

## 2024-04-17 DIAGNOSIS — M6281 Muscle weakness (generalized): Secondary | ICD-10-CM

## 2024-04-17 NOTE — Therapy (Signed)
 " OUTPATIENT PHYSICAL THERAPY NOTE   Patient Name: Kim [name] MRN: [id number] DOB:1971-03-05, 53 y.o., female Today's Date: 04/17/2024  END OF SESSION:  PT End of Session - 04/17/24 1039     Visit Number 9    Number of Visits 12    Date for Recertification  05/07/24    Authorization Type [employer]    Authorization Time Period 12 visits approved 02/08/24-06/10/24    Authorization - Visit Number 8    Authorization - Number of Visits 12    PT Start Time 1045    PT Stop Time 1116    PT Time Calculation (min) 31 min    Activity Tolerance Patient tolerated treatment well    Behavior During Therapy WFL for tasks assessed/performed          Past Medical History:  Diagnosis Date   Abnormal Pap smear    patient states she had cancer that was removed from her cervix   Asthma    when pregnant/ once 6 yrs ago only time asthma attack the dr said   Cancer [facility])    Clotting disorder    Constipation    GERD (gastroesophageal reflux disease) 10/19/2015   Gestational diabetes    H. pylori infection    Helicobacter pylori (H. pylori) infection 09/13/2023   Hemorrhoids    History of gestational diabetes    Hx of pulmonary embolus during pregnancy 11/09/2014   July 2016: CTA notable for pulmonary embolus to the posterior right  lower lobe. Provoked given pregnancy.     Coumadin  until 02/09/15.      Intramural leiomyoma of uterus 02/29/2016   Pulmonary embolism (HCC)    Renal insufficiency    TMJ (sprain of temporomandibular joint), initial encounter 12/29/2020   Vitamin D  deficiency    Vitamin D  deficiency    Past Surgical History:  Procedure Laterality Date   CHOLECYSTECTOMY     Patient Active Problem List   Diagnosis Date Noted   Neuropathy of right lateral plantar nerve 01/03/2024   Plantar fasciitis 12/05/2023   Well woman exam with routine gynecological exam 11/02/2023   Polyarthralgia 08/08/2023   Anxiety disorder due to general medical condition with panic  attack 01/31/2020   Cerebral aneurysm without rupture 01/31/2020   Intramural leiomyoma of uterus 02/29/2016   History of pulmonary embolism 02/24/2015    PCP: [name], MD  REFERRING PROVIDER:  Valdemar Rocky SAUNDERS, NP     REFERRING DIAG: Chronic left shoulder pain [M25.512, G89.29]   THERAPY DIAG:  Left shoulder pain, unspecified chronicity  Muscle weakness (generalized)  Rationale for Evaluation and Treatment: Rehabilitation  ONSET DATE: 1 month  SUBJECTIVE:  SUBJECTIVE STATEMENT: In-person interpreter was present throughout today's session.   Patient reports that she was feeling really good after the last session but two days ago she reached behind her (demonstrates an ER position) and she felt a lot of pain after this. It has gotten a little better over the past couple days.  Patient reporting some tenderness from use of towel roll with exercises, and points to L periscapular and cervical pain that was present yesterday.  EVAL: Patient reports that her L shoulder began with a mild pain for a few days. Then, one day it hurt so bad that I couldn't really move it. The day before she was lifting some cases of water. She states that it has been about 3 weeks to 1 months.    Hand dominance: Right  PERTINENT HISTORY: Relevant PMHx includes Pulmonary embolism, Renal insufficiency, Polyarthralgia, cerebral aneurysm   PAIN:  Are you having pain?  Yes: NPRS scale: 3/10 current, 10/10 worst  Pain location: L shoulder (anterior)  Pain description: popping/clicking,  Aggravating factors: Upper body dressing, lifting heavy things, sleeping on L side Relieving factors: I don't know, also taking pain medicine -- unsure if it's helping   PRECAUTIONS: None  RED FLAGS: None   WEIGHT BEARING  RESTRICTIONS: No  FALLS:  Has patient fallen in last 6 months? No  However, she did have a fall about 1 year ago, when she hit R side of head, but doesn't remember the details of how she fell  LIVING ENVIRONMENT: Lives with: lives with their family and lives with their spouse Lives in: House/apartment  OCCUPATION: [name]   PLOF: Independent  PATIENT GOALS:to have less pain, to be able to move my left arm normally   NEXT MD VISIT: 02/09/2024 Neurology, 03/05/2024 PCP  OBJECTIVE:  Note: Objective measures were completed at Evaluation unless otherwise noted.  DIAGNOSTIC FINDINGS:  01/18/2024: IMPRESSION: Mild elevation of the LEFT clavicle relative to the acromion is suspicious for [facility] joint separation of uncertain chronicity.  PATIENT SURVEYS:  Quick Dash:  QUICK DASH  Please rate your ability do the following activities in the last week by selecting the number below the appropriate response.   Activities Rating  Open a tight or new jar.  4 = Severe difficulty  Do heavy household chores (e.g., wash walls, floors). 4 = Severe difficulty  Carry a shopping bag or briefcase 5 = Unable  Wash your back. 4 = Severe difficulty  Use a knife to cut food. 4 = Severe difficulty  Recreational activities in which you take some force or impact through your arm, shoulder or hand (e.g., golf, hammering, tennis, etc.). 5 = Unable  During the past week, to what extent has your arm, shoulder or hand problem interfered with your normal social activities with family, friends, neighbors or groups?  3 = Moderately  During the past week, were you limited in your work or other regular daily activities as a result of your arm, shoulder or hand problem? 3 = Moderately limited  Rate the severity of the following symptoms in the last week: Arm, Shoulder, or hand pain. 3 = Moderate  Rate the severity of the following symptoms in the last week: Tingling (pins and needles) in your arm, shoulder or hand. 2 =  Mild  During the past week, how much difficulty have you had sleeping because of the pain in your arm, shoulder or hand?  4 = Severe difficulty   (A QuickDASH score may not be calculated if there  is greater than 1 missing item.)  Quick Dash Disability/Symptom Score: 68.2  Minimally Clinically Important Difference (MCID): 15-20 points  Flavio, F. et al. (2013). Minimally clinically important difference of the disabilities of the arm, shoulder, and hand outcome measures (DASH) and its shortened version (Quick DASH). Journal of Orthopaedic & Sports Physical Therapy, 44(1), 30-39)   COGNITION: Overall cognitive status: Within functional limits for tasks assessed     SENSATION: Not tested   UPPER EXTREMITY ROM:   Active ROM Right eval Left eval  Shoulder flexion  146  Shoulder extension    Shoulder abduction  90  Shoulder adduction    Shoulder internal rotation  WFL  Shoulder external rotation  WFL   Elbow flexion    Elbow extension    Wrist flexion    Wrist extension    Wrist ulnar deviation    Wrist radial deviation    Wrist pronation    Wrist supination    (Blank rows = not tested)  UPPER EXTREMITY MMT:  MMT Right eval Left eval  Shoulder flexion 5 3+  Shoulder extension    Shoulder abduction 5 3+  Shoulder adduction    Shoulder internal rotation  4-  Shoulder external rotation  3+  Middle trapezius    Lower trapezius    Elbow flexion  4  Elbow extension  4  Wrist flexion    Wrist extension    Wrist ulnar deviation    Wrist radial deviation    Wrist pronation    Wrist supination    Grip strength (lbs)    (Blank rows = not tested)                                                                                                                              TREATMENT DATE:  [facility] Adult PT Treatment:                                                DATE: 04/17/24 Manual Therapy  STM along L periscapular, UT, L CS mm  Therapeutic Exercise: UBE fwd/bwd  2 min ea level 1 Supine PROM flexion, scaption, abduction, ER/IR Supine shoulder flexion with dowel wide grip 2x10 Neuromuscular re-ed: Supine chest press with dowel 2x10 Rows red TB 2x10 Shoulder extension YTB x10 (painful today)  [facility] Adult PT Treatment:                                                DATE: 04/04/24  Manual Therapy  STM along L periscapular, UT, L CS mm   Therapeutic Exercise: UBE fwd/bwd 2 min ea level 3 Supine PROM flexion, scaption, abduction, ER/IR Supine shoulder flexion with dowel wide  grip 2x10  Neuromuscular re-ed: Rows red TB 2x15 Shoulder extension YTBx10 Resisted shoulder adduction red TB  Seated Shrugs with 3#   [facility] Adult PT Treatment:                                                DATE: 04/02/24 Therapeutic Exercise: UBE fwd/bwd 3 min ea level 2 Supine PROM flexion, scaption, abduction, ER/IR Supine shoulder flexion with dowel wide grip 2x10 Supine shoulder abduction AAROM with dowel x10  Supine Elbow flex/ext 1000g ball LUE 2x15 Neuromuscular re-ed: Rows green TB 2x15 Shoulder extension YTBx10 PNF  Resisted shoulder adduction from 45 degrees From 65-75 degrees Supine isometrics at 90 degrees  Reports some burning pain, mild resistance    PATIENT EDUCATION: Education details: reviewed initial home exercise program; discussion of POC, prognosis and goals for skilled PT   Person educated: Patient Education method: Explanation, Demonstration, and Handouts Education comprehension: verbalized understanding, returned demonstration, and needs further education  HOME EXERCISE PROGRAM: Access Code: [email or web address] URL: [email or web address] Date: 04/04/2024 Prepared by: Marko Molt  Exercises - Scaption Wall Slide with Towel  - 1 x daily - 2 sets - 10 reps - 3 sec hold - Isometric Shoulder Flexion at Wall  - 1 x daily - 1 sets - 10-15 reps - 5 sec hold - Isometric Shoulder Abduction at Wall  - 1 x daily - 1 sets - 10-15 reps -  5 sec hold - Isometric Shoulder Extension at Wall  - 1 x daily - 1 sets - 10-15 reps - 5 sec hold - Isometric Shoulder Adduction  - 1 x daily - 1 sets - 10-15 reps - 5 sec hold - Supine Cervical Retraction with Towel  - 2 x daily - 2 sets - 10 reps - 3 sec hold - Seated Shoulder Shrugs  - 2 x daily - 2 sets - 10 reps - 3 sec hold  ASSESSMENT:  CLINICAL IMPRESSION: Patient presents to PT reporting she felt good after the last session but that two days ago she reached behind her (ER position) and had sharp pain. She says it has gotten a little better today. She is worried that she has injured something in her shoulder, advised her to follow up with her MD if she feels like the pain is not improving. Regressed session today to accommodate increased pain. Continued to focus on gentle periscapular and RTC strengthening. Patient continues to benefit from skilled PT services and should be progressed as able to improve functional independence.   EVAL: Lisanne is a 53 y.o. female who was seen today for physical therapy evaluation and treatment for L shoulder pain with mobility deficits, with related AC separation. She is demonstrating diminished L UE MMT scores, and pain with overhead reaching. She has related pain and difficulty with upper body dressing, heavy lifting, and diminished sleep quality d/t pain when laying on L side.. She requires skilled PT services at this time to address relevant deficits and improve overall function.     OBJECTIVE IMPAIRMENTS: decreased activity tolerance, decreased knowledge of condition, decreased ROM, decreased strength, impaired UE functional use, and pain.   ACTIVITY LIMITATIONS: carrying, lifting, sleeping, bathing, dressing, reach over head, and hygiene/grooming  PARTICIPATION LIMITATIONS: meal prep, cleaning, laundry, interpersonal relationship, driving, shopping, and community activity  PERSONAL FACTORS: 3+ comorbidities: Relevant PMHx includes Pulmonary  embolism, Renal insufficiency, Polyarthralgia,  cerebral aneurysm  are also affecting patient's functional outcome.   REHAB POTENTIAL: Good  CLINICAL DECISION MAKING: Stable/uncomplicated  EVALUATION COMPLEXITY: Low   GOALS: Goals reviewed with patient? YES  SHORT TERM GOALS: Target date: 02/29/2024   Patient will be independent with initial home program at least 3 days/week.  Baseline: provided at eval Goal Status: MET Pt reports adherence 03/05/24    2.  Patient will demonstrate 10-15 degree improvement in L shoulder elevation. Baseline: 146deg shoulder flexion, 90deg shoulder abduction  03/26/24: 165deg shoulder flexion, 90 deg shoulder abduction in seated  Goal status: Ongoing   LONG TERM GOALS: Target date: 05/07/2024, last updated 03/26/2024   Patient will report improved overall functional ability with QuickDASH score of 30 or less.  Baseline: 68.2 03/26/24: 31.8 Goal Status: nearly met    2.  Patient will demonstrate improved L shoulder MMT scores of 4+/5 or greater.  Baseline: see objective measures  Goal status: ongoing, limited by pain   3.  Patient will report ability to perform all ADLs, including Upper Body Dressing without pain or difficulty  Baseline: moderately limited by pain and ROM deficits Goal status: ongoing   4.  Patient will demonstrate ability to perform floor to waist lifting of at least 25# using appropriate body mechanics and with no more than minimal pain in order to safely perform normal daily/occupational tasks.   Goal Status:ongoing; limited by pain   5.  Patient will demonstrate ability to perform overhead lifting of at least 5# using appropriate body mechanics and with no more than minimal pain in order to safely perform normal daily/occupational tasks.   Goal Status: ongoing, limited by pain      PLAN:  PT FREQUENCY: 1-2x/week  PT DURATION: 6 weeks  PLANNED INTERVENTIONS: 97164- PT Re-evaluation, 97110-Therapeutic exercises,  97530- Therapeutic activity, 97112- Neuromuscular re-education, 97535- Self Care, [id number]- Manual therapy, [id number]- Electrical stimulation (unattended), 20560 (1-2 muscles), 20561 (3+ muscles)- Dry Needling, Patient/Family education, Taping, Joint mobilization, Spinal mobilization, Cryotherapy, and Moist heat  PLAN FOR NEXT SESSION: review and updated HEP as indicated, address L shoulder/periscapular stabilization, patient education regarding management of symptoms, otherwise progress towards established rehab goals as tolerated   Corean Pouch PTA  04/17/2024 11:29 AM   "

## 2024-04-22 NOTE — Therapy (Signed)
 " OUTPATIENT PHYSICAL THERAPY NOTE   Patient Name: Kim [name] MRN: [id number] DOB:1971/02/20, 54 y.o., female Today's Date: 04/23/2024  END OF SESSION:  PT End of Session - 04/23/24 1057     Visit Number 10    Number of Visits 12    Date for Recertification  05/07/24    Authorization Type [employer]    Authorization Time Period 12 visits approved 02/08/24-06/10/24    Authorization - Visit Number 9    Authorization - Number of Visits 12    PT Start Time 1052    PT Stop Time 1125    PT Time Calculation (min) 33 min           Past Medical History:  Diagnosis Date   Abnormal Pap smear    patient states she had cancer that was removed from her cervix   Asthma    when pregnant/ once 6 yrs ago only time asthma attack the dr said   Cancer [facility])    Clotting disorder    Constipation    GERD (gastroesophageal reflux disease) 10/19/2015   Gestational diabetes    H. pylori infection    Helicobacter pylori (H. pylori) infection 09/13/2023   Hemorrhoids    History of gestational diabetes    Hx of pulmonary embolus during pregnancy 11/09/2014   July 2016: CTA notable for pulmonary embolus to the posterior right  lower lobe. Provoked given pregnancy.     Coumadin  until 02/09/15.      Intramural leiomyoma of uterus 02/29/2016   Pulmonary embolism (HCC)    Renal insufficiency    TMJ (sprain of temporomandibular joint), initial encounter 12/29/2020   Vitamin D  deficiency    Vitamin D  deficiency    Past Surgical History:  Procedure Laterality Date   CHOLECYSTECTOMY     Patient Active Problem List   Diagnosis Date Noted   Neuropathy of right lateral plantar nerve 01/03/2024   Plantar fasciitis 12/05/2023   Well woman exam with routine gynecological exam 11/02/2023   Polyarthralgia 08/08/2023   Anxiety disorder due to general medical condition with panic attack 01/31/2020   Cerebral aneurysm without rupture 01/31/2020   Intramural leiomyoma of uterus 02/29/2016    History of pulmonary embolism 02/24/2015    PCP: [name], MD  REFERRING PROVIDER:  Valdemar Rocky SAUNDERS, NP     REFERRING DIAG: Chronic left shoulder pain [M25.512, G89.29]   THERAPY DIAG:  Left shoulder pain, unspecified chronicity  Rationale for Evaluation and Treatment: Rehabilitation  ONSET DATE: 1 month  SUBJECTIVE:  SUBJECTIVE STATEMENT: In-person interpreter was present throughout today's session.   Patient states her pain has begun to subside since last week.     EVAL: Patient reports that her L shoulder began with a mild pain for a few days. Then, one day it hurt so bad that I couldn't really move it. The day before she was lifting some cases of water. She states that it has been about 3 weeks to 1 months.    Hand dominance: Right  PERTINENT HISTORY: Relevant PMHx includes Pulmonary embolism, Renal insufficiency, Polyarthralgia, cerebral aneurysm   PAIN:  Are you having pain?  Yes: NPRS scale: 3/10 current, 10/10 worst  Pain location: L shoulder (anterior)  Pain description: popping/clicking,  Aggravating factors: Upper body dressing, lifting heavy things, sleeping on L side Relieving factors: I don't know, also taking pain medicine -- unsure if it's helping   PRECAUTIONS: None  RED FLAGS: None   WEIGHT BEARING RESTRICTIONS: No  FALLS:  Has patient fallen in last 6 months? No  However, she did have a fall about 1 year ago, when she hit R side of head, but doesn't remember the details of how she fell  LIVING ENVIRONMENT: Lives with: lives with their family and lives with their spouse Lives in: House/apartment  OCCUPATION: [name]   PLOF: Independent  PATIENT GOALS:to have less pain, to be able to move my left arm normally   NEXT MD VISIT: 02/09/2024 Neurology,  03/05/2024 PCP  OBJECTIVE:  Note: Objective measures were completed at Evaluation unless otherwise noted.  DIAGNOSTIC FINDINGS:  01/18/2024: IMPRESSION: Mild elevation of the LEFT clavicle relative to the acromion is suspicious for [facility] joint separation of uncertain chronicity.  PATIENT SURVEYS:  Quick Dash:  QUICK DASH  Please rate your ability do the following activities in the last week by selecting the number below the appropriate response.   Activities Rating  Open a tight or new jar.  4 = Severe difficulty  Do heavy household chores (e.g., wash walls, floors). 4 = Severe difficulty  Carry a shopping bag or briefcase 5 = Unable  Wash your back. 4 = Severe difficulty  Use a knife to cut food. 4 = Severe difficulty  Recreational activities in which you take some force or impact through your arm, shoulder or hand (e.g., golf, hammering, tennis, etc.). 5 = Unable  During the past week, to what extent has your arm, shoulder or hand problem interfered with your normal social activities with family, friends, neighbors or groups?  3 = Moderately  During the past week, were you limited in your work or other regular daily activities as a result of your arm, shoulder or hand problem? 3 = Moderately limited  Rate the severity of the following symptoms in the last week: Arm, Shoulder, or hand pain. 3 = Moderate  Rate the severity of the following symptoms in the last week: Tingling (pins and needles) in your arm, shoulder or hand. 2 = Mild  During the past week, how much difficulty have you had sleeping because of the pain in your arm, shoulder or hand?  4 = Severe difficulty   (A QuickDASH score may not be calculated if there is greater than 1 missing item.)  Quick Dash Disability/Symptom Score: 68.2  Minimally Clinically Important Difference (MCID): 15-20 points  (Franchignoni, F. et al. (2013). Minimally clinically important difference of the disabilities of the arm, shoulder, and hand  outcome measures (DASH) and its shortened version (Quick DASH). Journal of Orthopaedic & Sports Physical  Therapy, 44(1), 30-39)   COGNITION: Overall cognitive status: Within functional limits for tasks assessed     SENSATION: Not tested   UPPER EXTREMITY ROM:   Active ROM Right eval Left eval  Shoulder flexion  146  Shoulder extension    Shoulder abduction  90  Shoulder adduction    Shoulder internal rotation  WFL  Shoulder external rotation  WFL   Elbow flexion    Elbow extension    Wrist flexion    Wrist extension    Wrist ulnar deviation    Wrist radial deviation    Wrist pronation    Wrist supination    (Blank rows = not tested)  UPPER EXTREMITY MMT:  MMT Right eval Left eval  Shoulder flexion 5 3+  Shoulder extension    Shoulder abduction 5 3+  Shoulder adduction    Shoulder internal rotation  4-  Shoulder external rotation  3+  Middle trapezius    Lower trapezius    Elbow flexion  4  Elbow extension  4  Wrist flexion    Wrist extension    Wrist ulnar deviation    Wrist radial deviation    Wrist pronation    Wrist supination    Grip strength (lbs)    (Blank rows = not tested)                                                                                                                              TREATMENT DATE:   [facility] Adult PT Treatment:                                                DATE: 04/23/2024  Manual Therapy  STM along L periscapular, UT, L CS mm   Therapeutic Exercise: UBE fwd/bwd 2 min ea level 1 Seated Horizontal Abduction YTB 2 x 10 Seated BIL Shoulder ER 2 x 10  YTB Supine chest press with dowel 2x10  [facility] Adult PT Treatment:                                                DATE: 04/17/24 Manual Therapy  STM along L periscapular, UT, L CS mm  Therapeutic Exercise: UBE fwd/bwd 2 min ea level 1 Supine PROM flexion, scaption, abduction, ER/IR Supine shoulder flexion with dowel wide grip 2x10 Neuromuscular re-ed: Supine chest  press with dowel 2x10 Rows red TB 2x10 Shoulder extension YTB x10 (painful today)  [facility] Adult PT Treatment:  DATE: 04/04/24  Manual Therapy  STM along L periscapular, UT, L CS mm   Therapeutic Exercise: UBE fwd/bwd 2 min ea level 3 Supine PROM flexion, scaption, abduction, ER/IR Supine shoulder flexion with dowel wide grip 2x10  Neuromuscular re-ed: Rows red TB 2x15 Shoulder extension YTBx10 Resisted shoulder adduction red TB  Seated Shrugs with 3#      PATIENT EDUCATION: Education details: reviewed initial home exercise program; discussion of POC, prognosis and goals for skilled PT   Person educated: Patient Education method: Explanation, Demonstration, and Handouts Education comprehension: verbalized understanding, returned demonstration, and needs further education  HOME EXERCISE PROGRAM: Access Code: [email or web address] URL: [email or web address] Date: 04/04/2024 Prepared by: Marko Molt  Exercises - Scaption Wall Slide with Towel  - 1 x daily - 2 sets - 10 reps - 3 sec hold - Isometric Shoulder Flexion at Wall  - 1 x daily - 1 sets - 10-15 reps - 5 sec hold - Isometric Shoulder Abduction at Wall  - 1 x daily - 1 sets - 10-15 reps - 5 sec hold - Isometric Shoulder Extension at Wall  - 1 x daily - 1 sets - 10-15 reps - 5 sec hold - Isometric Shoulder Adduction  - 1 x daily - 1 sets - 10-15 reps - 5 sec hold - Supine Cervical Retraction with Towel  - 2 x daily - 2 sets - 10 reps - 3 sec hold - Seated Shoulder Shrugs  - 2 x daily - 2 sets - 10 reps - 3 sec hold  ASSESSMENT:  CLINICAL IMPRESSION: Patient had fair tolerance of today's session. Requires additional patient education regarding normal expectations for healing, recovery and overall POC. We will continue to progress towards established rehab goals.   EVAL: Kim [name] is a 54 y.o. female who was seen today for physical therapy evaluation and treatment for L  shoulder pain with mobility deficits, with related AC separation. She is demonstrating diminished L UE MMT scores, and pain with overhead reaching. She has related pain and difficulty with upper body dressing, heavy lifting, and diminished sleep quality d/t pain when laying on L side.. She requires skilled PT services at this time to address relevant deficits and improve overall function.     OBJECTIVE IMPAIRMENTS: decreased activity tolerance, decreased knowledge of condition, decreased ROM, decreased strength, impaired UE functional use, and pain.   ACTIVITY LIMITATIONS: carrying, lifting, sleeping, bathing, dressing, reach over head, and hygiene/grooming  PARTICIPATION LIMITATIONS: meal prep, cleaning, laundry, interpersonal relationship, driving, shopping, and community activity  PERSONAL FACTORS: 3+ comorbidities: Relevant PMHx includes Pulmonary embolism, Renal insufficiency, Polyarthralgia, cerebral aneurysm  are also affecting patient's functional outcome.   REHAB POTENTIAL: Good  CLINICAL DECISION MAKING: Stable/uncomplicated  EVALUATION COMPLEXITY: Low   GOALS: Goals reviewed with patient? YES  SHORT TERM GOALS: Target date: 02/29/2024   Patient will be independent with initial home program at least 3 days/week.  Baseline: provided at eval Goal Status: MET Pt reports adherence 03/05/24    2.  Patient will demonstrate 10-15 degree improvement in L shoulder elevation. Baseline: 146deg shoulder flexion, 90deg shoulder abduction  03/26/24: 165deg shoulder flexion, 90 deg shoulder abduction in seated  Goal status: Ongoing   LONG TERM GOALS: Target date: 05/07/2024, last updated 03/26/2024   Patient will report improved overall functional ability with QuickDASH score of 30 or less.  Baseline: 68.2 03/26/24: 31.8 Goal Status: nearly met    2.  Patient will demonstrate improved L shoulder MMT scores of 4+/5 or greater.  Baseline:  see objective measures  Goal status:  ongoing, limited by pain   3.  Patient will report ability to perform all ADLs, including Upper Body Dressing without pain or difficulty  Baseline: moderately limited by pain and ROM deficits Goal status: ongoing   4.  Patient will demonstrate ability to perform floor to waist lifting of at least 25# using appropriate body mechanics and with no more than minimal pain in order to safely perform normal daily/occupational tasks.   Goal Status:ongoing; limited by pain   5.  Patient will demonstrate ability to perform overhead lifting of at least 5# using appropriate body mechanics and with no more than minimal pain in order to safely perform normal daily/occupational tasks.   Goal Status: ongoing, limited by pain      PLAN:  PT FREQUENCY: 1-2x/week  PT DURATION: 6 weeks  PLANNED INTERVENTIONS: 97164- PT Re-evaluation, 97110-Therapeutic exercises, 97530- Therapeutic activity, 97112- Neuromuscular re-education, 97535- Self Care, [id number]- Manual therapy, [id number]- Electrical stimulation (unattended), 20560 (1-2 muscles), 20561 (3+ muscles)- Dry Needling, Patient/Family education, Taping, Joint mobilization, Spinal mobilization, Cryotherapy, and Moist heat  PLAN FOR NEXT SESSION: review and updated HEP as indicated, address L shoulder/periscapular stabilization, patient education regarding management of symptoms, otherwise progress towards established rehab goals as tolerated   Marko Molt, PT, DPT  04/23/2024 12:50 PM    "

## 2024-04-23 ENCOUNTER — Ambulatory Visit: Attending: Internal Medicine

## 2024-04-23 DIAGNOSIS — M5459 Other low back pain: Secondary | ICD-10-CM | POA: Insufficient documentation

## 2024-04-23 DIAGNOSIS — M25512 Pain in left shoulder: Secondary | ICD-10-CM | POA: Diagnosis present

## 2024-04-23 DIAGNOSIS — M6281 Muscle weakness (generalized): Secondary | ICD-10-CM | POA: Insufficient documentation

## 2024-04-25 ENCOUNTER — Ambulatory Visit

## 2024-04-25 DIAGNOSIS — M25512 Pain in left shoulder: Secondary | ICD-10-CM | POA: Diagnosis not present

## 2024-04-25 DIAGNOSIS — M5459 Other low back pain: Secondary | ICD-10-CM

## 2024-04-25 DIAGNOSIS — M6281 Muscle weakness (generalized): Secondary | ICD-10-CM

## 2024-04-25 NOTE — Therapy (Signed)
 " OUTPATIENT PHYSICAL THERAPY NOTE   Patient Name: [facility] Everitt Elder MRN: [id number] DOB:January 29, 1971, 54 y.o., female Today's Date: 04/25/2024  END OF SESSION:  PT End of Session - 04/25/24 1053     Visit Number 11    Number of Visits 12    Date for Recertification  05/07/24    Authorization Type [employer]    Authorization Time Period 12 visits approved 02/08/24-06/10/24    Authorization - Visit Number 10    Authorization - Number of Visits 12    PT Start Time 1052    PT Stop Time 1130    PT Time Calculation (min) 38 min    Activity Tolerance Patient tolerated treatment well    Behavior During Therapy WFL for tasks assessed/performed            Past Medical History:  Diagnosis Date   Abnormal Pap smear    patient states she had cancer that was removed from her cervix   Asthma    when pregnant/ once 6 yrs ago only time asthma attack the dr said   Cancer [facility])    Clotting disorder    Constipation    GERD (gastroesophageal reflux disease) 10/19/2015   Gestational diabetes    H. pylori infection    Helicobacter pylori (H. pylori) infection 09/13/2023   Hemorrhoids    History of gestational diabetes    Hx of pulmonary embolus during pregnancy 11/09/2014   July 2016: CTA notable for pulmonary embolus to the posterior right  lower lobe. Provoked given pregnancy.     Coumadin  until 02/09/15.      Intramural leiomyoma of uterus 02/29/2016   Pulmonary embolism (HCC)    Renal insufficiency    TMJ (sprain of temporomandibular joint), initial encounter 12/29/2020   Vitamin D  deficiency    Vitamin D  deficiency    Past Surgical History:  Procedure Laterality Date   CHOLECYSTECTOMY     Patient Active Problem List   Diagnosis Date Noted   Neuropathy of right lateral plantar nerve 01/03/2024   Plantar fasciitis 12/05/2023   Well woman exam with routine gynecological exam 11/02/2023   Polyarthralgia 08/08/2023   Anxiety disorder due to general medical condition with  panic attack 01/31/2020   Cerebral aneurysm without rupture 01/31/2020   Intramural leiomyoma of uterus 02/29/2016   History of pulmonary embolism 02/24/2015    PCP: [name], MD  REFERRING PROVIDER:  Valdemar Rocky SAUNDERS, NP     REFERRING DIAG: Chronic left shoulder pain [M25.512, G89.29]   THERAPY DIAG:  Left shoulder pain, unspecified chronicity  Muscle weakness (generalized)  Other low back pain  Rationale for Evaluation and Treatment: Rehabilitation  ONSET DATE: 1 month  SUBJECTIVE:  SUBJECTIVE STATEMENT: In-person interpreter was present throughout today's session.   Patient reports that her shoulder and arm are feeling better today.   EVAL: Patient reports that her L shoulder began with a mild pain for a few days. Then, one day it hurt so bad that I couldn't really move it. The day before she was lifting some cases of water. She states that it has been about 3 weeks to 1 months.    Hand dominance: Right  PERTINENT HISTORY: Relevant PMHx includes Pulmonary embolism, Renal insufficiency, Polyarthralgia, cerebral aneurysm   PAIN:  Are you having pain?  Yes: NPRS scale: 3/10 current, 10/10 worst  Pain location: L shoulder (anterior)  Pain description: popping/clicking,  Aggravating factors: Upper body dressing, lifting heavy things, sleeping on L side Relieving factors: I don't know, also taking pain medicine -- unsure if it's helping   PRECAUTIONS: None  RED FLAGS: None   WEIGHT BEARING RESTRICTIONS: No  FALLS:  Has patient fallen in last 6 months? No  However, she did have a fall about 1 year ago, when she hit R side of head, but doesn't remember the details of how she fell  LIVING ENVIRONMENT: Lives with: lives with their family and lives with their spouse Lives in:  House/apartment  OCCUPATION: [name]   PLOF: Independent  PATIENT GOALS:to have less pain, to be able to move my left arm normally   NEXT MD VISIT: 02/09/2024 Neurology, 03/05/2024 PCP  OBJECTIVE:  Note: Objective measures were completed at Evaluation unless otherwise noted.  DIAGNOSTIC FINDINGS:  01/18/2024: IMPRESSION: Mild elevation of the LEFT clavicle relative to the acromion is suspicious for [facility] joint separation of uncertain chronicity.  PATIENT SURVEYS:  Quick Dash:  QUICK DASH  Please rate your ability do the following activities in the last week by selecting the number below the appropriate response.   Activities Rating  Open a tight or new jar.  4 = Severe difficulty  Do heavy household chores (e.g., wash walls, floors). 4 = Severe difficulty  Carry a shopping bag or briefcase 5 = Unable  Wash your back. 4 = Severe difficulty  Use a knife to cut food. 4 = Severe difficulty  Recreational activities in which you take some force or impact through your arm, shoulder or hand (e.g., golf, hammering, tennis, etc.). 5 = Unable  During the past week, to what extent has your arm, shoulder or hand problem interfered with your normal social activities with family, friends, neighbors or groups?  3 = Moderately  During the past week, were you limited in your work or other regular daily activities as a result of your arm, shoulder or hand problem? 3 = Moderately limited  Rate the severity of the following symptoms in the last week: Arm, Shoulder, or hand pain. 3 = Moderate  Rate the severity of the following symptoms in the last week: Tingling (pins and needles) in your arm, shoulder or hand. 2 = Mild  During the past week, how much difficulty have you had sleeping because of the pain in your arm, shoulder or hand?  4 = Severe difficulty   (A QuickDASH score may not be calculated if there is greater than 1 missing item.)  Quick Dash Disability/Symptom Score: 68.2  Minimally  Clinically Important Difference (MCID): 15-20 points  (Franchignoni, F. et al. (2013). Minimally clinically important difference of the disabilities of the arm, shoulder, and hand outcome measures (DASH) and its shortened version (Quick DASH). Journal of Orthopaedic & Sports Physical Therapy, 44(1),  30-39)   COGNITION: Overall cognitive status: Within functional limits for tasks assessed     SENSATION: Not tested   UPPER EXTREMITY ROM:   Active ROM Right eval Left eval  Shoulder flexion  146  Shoulder extension    Shoulder abduction  90  Shoulder adduction    Shoulder internal rotation  WFL  Shoulder external rotation  WFL   Elbow flexion    Elbow extension    Wrist flexion    Wrist extension    Wrist ulnar deviation    Wrist radial deviation    Wrist pronation    Wrist supination    (Blank rows = not tested)  UPPER EXTREMITY MMT:  MMT Right eval Left eval  Shoulder flexion 5 3+  Shoulder extension    Shoulder abduction 5 3+  Shoulder adduction    Shoulder internal rotation  4-  Shoulder external rotation  3+  Middle trapezius    Lower trapezius    Elbow flexion  4  Elbow extension  4  Wrist flexion    Wrist extension    Wrist ulnar deviation    Wrist radial deviation    Wrist pronation    Wrist supination    Grip strength (lbs)    (Blank rows = not tested)                                                                                                                              TREATMENT DATE:  [facility] Adult PT Treatment:                                                DATE: 04/25/24 Therapeutic Exercise: UBE fwd/bwd 3 min ea level 1 Seated Horizontal Abduction YTB (too painful today) Seated BIL Shoulder ER 2 x 10  YTB Supine chest press with dowel 2x10 Supine shoulder flexion with dowel 2x10 Seated ITYs no weight x10 ea (smaller ROM on T d/t pain) Seated OH press with dowel x10 (small range) Sidelying ER (very painful, sharp, D/C) Manual Therapy: STM  along L periscapular, UT, L CS mm    [facility] Adult PT Treatment:                                                DATE: 04/23/2024  Manual Therapy  STM along L periscapular, UT, L CS mm   Therapeutic Exercise: UBE fwd/bwd 2 min ea level 1 Seated Horizontal Abduction YTB 2 x 10 Seated BIL Shoulder ER 2 x 10  YTB Supine chest press with dowel 2x10  [facility] Adult PT Treatment:  DATE: 04/17/24 Manual Therapy  STM along L periscapular, UT, L CS mm  Therapeutic Exercise: UBE fwd/bwd 2 min ea level 1 Supine PROM flexion, scaption, abduction, ER/IR Supine shoulder flexion with dowel wide grip 2x10 Neuromuscular re-ed: Supine chest press with dowel 2x10 Rows red TB 2x10 Shoulder extension YTB x10 (painful today)   PATIENT EDUCATION: Education details: reviewed initial home exercise program; discussion of POC, prognosis and goals for skilled PT   Person educated: Patient Education method: Explanation, Demonstration, and Handouts Education comprehension: verbalized understanding, returned demonstration, and needs further education  HOME EXERCISE PROGRAM: Access Code: [email or web address] URL: [email or web address] Date: 04/04/2024 Prepared by: Marko Molt  Exercises - Scaption Wall Slide with Towel  - 1 x daily - 2 sets - 10 reps - 3 sec hold - Isometric Shoulder Flexion at Wall  - 1 x daily - 1 sets - 10-15 reps - 5 sec hold - Isometric Shoulder Abduction at Wall  - 1 x daily - 1 sets - 10-15 reps - 5 sec hold - Isometric Shoulder Extension at Wall  - 1 x daily - 1 sets - 10-15 reps - 5 sec hold - Isometric Shoulder Adduction  - 1 x daily - 1 sets - 10-15 reps - 5 sec hold - Supine Cervical Retraction with Towel  - 2 x daily - 2 sets - 10 reps - 3 sec hold - Seated Shoulder Shrugs  - 2 x daily - 2 sets - 10 reps - 3 sec hold  ASSESSMENT:  CLINICAL IMPRESSION: Patient presents to PT reporting improvements in her pain today. She  describes more pain with abduction motions and less with flexion. During session she was able to perform resisted BIL ER with no pain but when performing sidelying ER unresisted she had a sharp increase in pain and had to sit up to relieve the pain. She does well with manual techniques reporting decreased tension from this. Patient continues to benefit from skilled PT services and should be progressed as able to improve functional independence.   EVAL: Siren is a 55 y.o. female who was seen today for physical therapy evaluation and treatment for L shoulder pain with mobility deficits, with related AC separation. She is demonstrating diminished L UE MMT scores, and pain with overhead reaching. She has related pain and difficulty with upper body dressing, heavy lifting, and diminished sleep quality d/t pain when laying on L side.. She requires skilled PT services at this time to address relevant deficits and improve overall function.     OBJECTIVE IMPAIRMENTS: decreased activity tolerance, decreased knowledge of condition, decreased ROM, decreased strength, impaired UE functional use, and pain.   ACTIVITY LIMITATIONS: carrying, lifting, sleeping, bathing, dressing, reach over head, and hygiene/grooming  PARTICIPATION LIMITATIONS: meal prep, cleaning, laundry, interpersonal relationship, driving, shopping, and community activity  PERSONAL FACTORS: 3+ comorbidities: Relevant PMHx includes Pulmonary embolism, Renal insufficiency, Polyarthralgia, cerebral aneurysm  are also affecting patient's functional outcome.   REHAB POTENTIAL: Good  CLINICAL DECISION MAKING: Stable/uncomplicated  EVALUATION COMPLEXITY: Low   GOALS: Goals reviewed with patient? YES  SHORT TERM GOALS: Target date: 02/29/2024   Patient will be independent with initial home program at least 3 days/week.  Baseline: provided at eval Goal Status: MET Pt reports adherence 03/05/24    2.  Patient will demonstrate 10-15  degree improvement in L shoulder elevation. Baseline: 146deg shoulder flexion, 90deg shoulder abduction  03/26/24: 165deg shoulder flexion, 90 deg shoulder abduction in seated  Goal status: Ongoing   LONG TERM GOALS:  Target date: 05/07/2024, last updated 03/26/2024   Patient will report improved overall functional ability with QuickDASH score of 30 or less.  Baseline: 68.2 03/26/24: 31.8 Goal Status: nearly met    2.  Patient will demonstrate improved L shoulder MMT scores of 4+/5 or greater.  Baseline: see objective measures  Goal status: ongoing, limited by pain   3.  Patient will report ability to perform all ADLs, including Upper Body Dressing without pain or difficulty  Baseline: moderately limited by pain and ROM deficits Goal status: ongoing   4.  Patient will demonstrate ability to perform floor to waist lifting of at least 25# using appropriate body mechanics and with no more than minimal pain in order to safely perform normal daily/occupational tasks.   Goal Status:ongoing; limited by pain   5.  Patient will demonstrate ability to perform overhead lifting of at least 5# using appropriate body mechanics and with no more than minimal pain in order to safely perform normal daily/occupational tasks.   Goal Status: ongoing, limited by pain      PLAN:  PT FREQUENCY: 1-2x/week  PT DURATION: 6 weeks  PLANNED INTERVENTIONS: 97164- PT Re-evaluation, 97110-Therapeutic exercises, 97530- Therapeutic activity, 97112- Neuromuscular re-education, 97535- Self Care, [id number]- Manual therapy, [id number]- Electrical stimulation (unattended), 20560 (1-2 muscles), 20561 (3+ muscles)- Dry Needling, Patient/Family education, Taping, Joint mobilization, Spinal mobilization, Cryotherapy, and Moist heat  PLAN FOR NEXT SESSION: review and updated HEP as indicated, address L shoulder/periscapular stabilization, patient education regarding management of symptoms, otherwise progress towards established rehab  goals as tolerated   Corean Pouch PTA  04/25/2024 11:31 AM   "

## 2024-04-29 ENCOUNTER — Ambulatory Visit

## 2024-04-29 DIAGNOSIS — M6281 Muscle weakness (generalized): Secondary | ICD-10-CM

## 2024-04-29 DIAGNOSIS — M25512 Pain in left shoulder: Secondary | ICD-10-CM

## 2024-04-29 NOTE — Therapy (Signed)
 " OUTPATIENT PHYSICAL THERAPY NOTE   Patient Name: Kim [name] MRN: [id number] DOB:[birth year], 54 y.o., female Today's Date: 04/29/2024  END OF SESSION:  PT End of Session - 04/29/24 0959     Visit Number 12    Date for Recertification  05/07/24    Authorization Type [employer]    Authorization Time Period 12 visits approved 02/08/24-06/10/24    Authorization - Visit Number 11    Authorization - Number of Visits 12    PT Start Time 1000    PT Stop Time 1040    PT Time Calculation (min) 40 min    Activity Tolerance Patient tolerated treatment well    Behavior During Therapy [facility] for tasks assessed/performed         Past Medical History:  Diagnosis Date   Abnormal Pap smear    patient states she had cancer that was removed from her cervix   Asthma    when pregnant/ once 6 yrs ago only time asthma attack the dr said   Cancer [facility])    Clotting disorder    Constipation    GERD (gastroesophageal reflux disease) 10/19/2015   Gestational diabetes    H. pylori infection    Helicobacter pylori (H. pylori) infection 09/13/2023   Hemorrhoids    History of gestational diabetes    Hx of pulmonary embolus during pregnancy 11/09/2014   July 2016: CTA notable for pulmonary embolus to the posterior right  lower lobe. Provoked given pregnancy.     Coumadin  until 02/09/15.      Intramural leiomyoma of uterus 02/29/2016   Pulmonary embolism (HCC)    Renal insufficiency    TMJ (sprain of temporomandibular joint), initial encounter 12/29/2020   Vitamin D  deficiency    Vitamin D  deficiency    Past Surgical History:  Procedure Laterality Date   CHOLECYSTECTOMY     Patient Active Problem List   Diagnosis Date Noted   Neuropathy of right lateral plantar nerve 01/03/2024   Plantar fasciitis 12/05/2023   Well woman exam with routine gynecological exam 11/02/2023   Polyarthralgia 08/08/2023   Anxiety disorder due to general medical condition with panic attack 01/31/2020    Cerebral aneurysm without rupture 01/31/2020   Intramural leiomyoma of uterus 02/29/2016   History of pulmonary embolism 02/24/2015    PCP: [name], MD  REFERRING PROVIDER:  Valdemar Rocky SAUNDERS, NP     REFERRING DIAG: Chronic left shoulder pain [M25.512, G89.29]   THERAPY DIAG:  Left shoulder pain, unspecified chronicity  Muscle weakness (generalized)  Rationale for Evaluation and Treatment: Rehabilitation  ONSET DATE: 1 month  SUBJECTIVE:  SUBJECTIVE STATEMENT: In-person interpreter was present throughout today's session.   Patient reports that her shoulder is feeling better today, she did have some pain over the weekend when getting items off of the Christmas tree (demonstrates OH motion)  EVAL: Patient reports that her L shoulder began with a mild pain for a few days. Then, one day it hurt so bad that I couldn't really move it. The day before she was lifting some cases of water. She states that it has been about 3 weeks to 1 months.    Hand dominance: Right  PERTINENT HISTORY: Relevant PMHx includes Pulmonary embolism, Renal insufficiency, Polyarthralgia, cerebral aneurysm   PAIN:  Are you having pain?  Yes: NPRS scale: 3/10 current, 10/10 worst  Pain location: L shoulder (anterior)  Pain description: popping/clicking,  Aggravating factors: Upper body dressing, lifting heavy things, sleeping on L side Relieving factors: I don't know, also taking pain medicine -- unsure if it's helping   PRECAUTIONS: None  RED FLAGS: None   WEIGHT BEARING RESTRICTIONS: No  FALLS:  Has patient fallen in last 6 months? No  However, she did have a fall about 1 year ago, when she hit R side of head, but doesn't remember the details of how she fell  LIVING ENVIRONMENT: Lives with: lives with  their family and lives with their spouse Lives in: House/apartment  OCCUPATION: [name]   PLOF: Independent  PATIENT GOALS:to have less pain, to be able to move my left arm normally   NEXT MD VISIT: 02/09/2024 Neurology, 03/05/2024 PCP  OBJECTIVE:  Note: Objective measures were completed at Evaluation unless otherwise noted.  DIAGNOSTIC FINDINGS:  01/18/2024: IMPRESSION: Mild elevation of the LEFT clavicle relative to the acromion is suspicious for [facility] joint separation of uncertain chronicity.  PATIENT SURVEYS:  Quick Dash:  QUICK DASH  Please rate your ability do the following activities in the last week by selecting the number below the appropriate response.   Activities Rating  Open a tight or new jar.  4 = Severe difficulty  Do heavy household chores (e.g., wash walls, floors). 4 = Severe difficulty  Carry a shopping bag or briefcase 5 = Unable  Wash your back. 4 = Severe difficulty  Use a knife to cut food. 4 = Severe difficulty  Recreational activities in which you take some force or impact through your arm, shoulder or hand (e.g., golf, hammering, tennis, etc.). 5 = Unable  During the past week, to what extent has your arm, shoulder or hand problem interfered with your normal social activities with family, friends, neighbors or groups?  3 = Moderately  During the past week, were you limited in your work or other regular daily activities as a result of your arm, shoulder or hand problem? 3 = Moderately limited  Rate the severity of the following symptoms in the last week: Arm, Shoulder, or hand pain. 3 = Moderate  Rate the severity of the following symptoms in the last week: Tingling (pins and needles) in your arm, shoulder or hand. 2 = Mild  During the past week, how much difficulty have you had sleeping because of the pain in your arm, shoulder or hand?  4 = Severe difficulty   (A QuickDASH score may not be calculated if there is greater than 1 missing item.)  Quick Dash  Disability/Symptom Score: 68.2  Minimally Clinically Important Difference (MCID): 15-20 points  (Franchignoni, F. et al. (2013). Minimally clinically important difference of the disabilities of the arm, shoulder, and hand outcome  measures (DASH) and its shortened version (Quick DASH). Journal of Orthopaedic & Sports Physical Therapy, 44(1), 30-39)   COGNITION: Overall cognitive status: Within functional limits for tasks assessed     SENSATION: Not tested   UPPER EXTREMITY ROM:   Active ROM Right eval Left eval  Shoulder flexion  146  Shoulder extension    Shoulder abduction  90  Shoulder adduction    Shoulder internal rotation  WFL  Shoulder external rotation  WFL   Elbow flexion    Elbow extension    Wrist flexion    Wrist extension    Wrist ulnar deviation    Wrist radial deviation    Wrist pronation    Wrist supination    (Blank rows = not tested)  UPPER EXTREMITY MMT:  MMT Right eval Left eval  Shoulder flexion 5 3+  Shoulder extension    Shoulder abduction 5 3+  Shoulder adduction    Shoulder internal rotation  4-  Shoulder external rotation  3+  Middle trapezius    Lower trapezius    Elbow flexion  4  Elbow extension  4  Wrist flexion    Wrist extension    Wrist ulnar deviation    Wrist radial deviation    Wrist pronation    Wrist supination    Grip strength (lbs)    (Blank rows = not tested)                                                                                                                              TREATMENT DATE:  [facility] Adult PT Treatment:                                                DATE: 04/29/24 Therapeutic Exercise: UBE fwd/bwd 3 min ea level 1 Rows YTB 2x10 Shoulder extension YTB 2x10 Seated Horizontal Abduction YTB <90 small range x10 Seated BIL Shoulder ER 2 x 10  YTB Supine chest press with dowel 2x10 Supine ER with dowel 2x10 Supine shoulder flexion with dowel 2x10 Seated ITYs no weight x10 ea (improved ROM on T  today) Seated OH press with dowel x10 (improved range today) Manual Therapy: STM along L periscapular, UT, L CS mm    [facility] Adult PT Treatment:                                                DATE: 04/25/24 Therapeutic Exercise: UBE fwd/bwd 3 min ea level 1 Seated Horizontal Abduction YTB (too painful today) Seated BIL Shoulder ER 2 x 10  YTB Supine chest press with dowel 2x10 Supine shoulder flexion with dowel 2x10 Seated ITYs no weight x10 ea (smaller ROM on  T d/t pain) Seated OH press with dowel x10 (small range) Sidelying ER (very painful, sharp, D/C) Manual Therapy: STM along L periscapular, UT, L CS mm    [facility] Adult PT Treatment:                                                DATE: 04/23/2024  Manual Therapy  STM along L periscapular, UT, L CS mm   Therapeutic Exercise: UBE fwd/bwd 2 min ea level 1 Seated Horizontal Abduction YTB 2 x 10 Seated BIL Shoulder ER 2 x 10  YTB Supine chest press with dowel 2x10    PATIENT EDUCATION: Education details: reviewed initial home exercise program; discussion of POC, prognosis and goals for skilled PT   Person educated: Patient Education method: Explanation, Demonstration, and Handouts Education comprehension: verbalized understanding, returned demonstration, and needs further education  HOME EXERCISE PROGRAM: Access Code: [email or web address] URL: [email or web address] Date: 04/04/2024 Prepared by: Marko Molt  Exercises - Scaption Wall Slide with Towel  - 1 x daily - 2 sets - 10 reps - 3 sec hold - Isometric Shoulder Flexion at Wall  - 1 x daily - 1 sets - 10-15 reps - 5 sec hold - Isometric Shoulder Abduction at Wall  - 1 x daily - 1 sets - 10-15 reps - 5 sec hold - Isometric Shoulder Extension at Wall  - 1 x daily - 1 sets - 10-15 reps - 5 sec hold - Isometric Shoulder Adduction  - 1 x daily - 1 sets - 10-15 reps - 5 sec hold - Supine Cervical Retraction with Towel  - 2 x daily - 2 sets - 10 reps - 3 sec hold - Seated  Shoulder Shrugs  - 2 x daily - 2 sets - 10 reps - 3 sec hold  ASSESSMENT:  CLINICAL IMPRESSION: Patient presents to PT reporting improvements in her pain, did have some pain over the weekend when taking down Christmas decorations. Session today continued to focus on shoulder ROM and strengthening. She was able to complete all exercises today with no sharp increases in pain. Patient continues to benefit from skilled PT services and should be progressed as able to improve functional independence.   EVAL: Kim [name] is a 54 y.o. female who was seen today for physical therapy evaluation and treatment for L shoulder pain with mobility deficits, with related AC separation. She is demonstrating diminished L UE MMT scores, and pain with overhead reaching. She has related pain and difficulty with upper body dressing, heavy lifting, and diminished sleep quality d/t pain when laying on L side.. She requires skilled PT services at this time to address relevant deficits and improve overall function.     OBJECTIVE IMPAIRMENTS: decreased activity tolerance, decreased knowledge of condition, decreased ROM, decreased strength, impaired UE functional use, and pain.   ACTIVITY LIMITATIONS: carrying, lifting, sleeping, bathing, dressing, reach over head, and hygiene/grooming  PARTICIPATION LIMITATIONS: meal prep, cleaning, laundry, interpersonal relationship, driving, shopping, and community activity  PERSONAL FACTORS: 3+ comorbidities: Relevant PMHx includes Pulmonary embolism, Renal insufficiency, Polyarthralgia, cerebral aneurysm  are also affecting patient's functional outcome.   REHAB POTENTIAL: Good  CLINICAL DECISION MAKING: Stable/uncomplicated  EVALUATION COMPLEXITY: Low   GOALS: Goals reviewed with patient? YES  SHORT TERM GOALS: Target date: 02/29/2024   Patient will be independent with initial home program at least 3 days/week.  Baseline: provided  at eval Goal Status: MET Pt reports adherence  03/05/24    2.  Patient will demonstrate 10-15 degree improvement in L shoulder elevation. Baseline: 146deg shoulder flexion, 90deg shoulder abduction  03/26/24: 165deg shoulder flexion, 90 deg shoulder abduction in seated  Goal status: Ongoing   LONG TERM GOALS: Target date: 05/07/2024, last updated 03/26/2024   Patient will report improved overall functional ability with QuickDASH score of 30 or less.  Baseline: 68.2 03/26/24: 31.8 Goal Status: nearly met    2.  Patient will demonstrate improved L shoulder MMT scores of 4+/5 or greater.  Baseline: see objective measures  Goal status: ongoing, limited by pain   3.  Patient will report ability to perform all ADLs, including Upper Body Dressing without pain or difficulty  Baseline: moderately limited by pain and ROM deficits Goal status: ongoing   4.  Patient will demonstrate ability to perform floor to waist lifting of at least 25# using appropriate body mechanics and with no more than minimal pain in order to safely perform normal daily/occupational tasks.   Goal Status:ongoing; limited by pain   5.  Patient will demonstrate ability to perform overhead lifting of at least 5# using appropriate body mechanics and with no more than minimal pain in order to safely perform normal daily/occupational tasks.   Goal Status: ongoing, limited by pain      PLAN:  PT FREQUENCY: 1-2x/week  PT DURATION: 6 weeks  PLANNED INTERVENTIONS: 97164- PT Re-evaluation, 97110-Therapeutic exercises, 97530- Therapeutic activity, 97112- Neuromuscular re-education, 97535- Self Care, [id number]- Manual therapy, [id number]- Electrical stimulation (unattended), 20560 (1-2 muscles), 20561 (3+ muscles)- Dry Needling, Patient/Family education, Taping, Joint mobilization, Spinal mobilization, Cryotherapy, and Moist heat  PLAN FOR NEXT SESSION: review and updated HEP as indicated, address L shoulder/periscapular stabilization, patient education regarding management of  symptoms, otherwise progress towards established rehab goals as tolerated   Corean Pouch PTA  04/29/2024 10:41 AM   "

## 2024-05-01 ENCOUNTER — Ambulatory Visit

## 2024-05-01 DIAGNOSIS — M25512 Pain in left shoulder: Secondary | ICD-10-CM

## 2024-05-01 DIAGNOSIS — M6281 Muscle weakness (generalized): Secondary | ICD-10-CM

## 2024-05-01 NOTE — Therapy (Signed)
 " OUTPATIENT PHYSICAL THERAPY NOTE   Patient Name: Kim [name] MRN: [id number] DOB:Feb 28, 1971, 54 y.o., female Today's Date: 05/01/2024  END OF SESSION:  PT End of Session - 05/01/24 1046     Visit Number 13    Date for Recertification  05/07/24    Authorization Type [employer]    Authorization Time Period 12 visits approved 02/08/24-06/10/24    Authorization - Visit Number 12    Authorization - Number of Visits 12    PT Start Time 1045    PT Stop Time 1125    PT Time Calculation (min) 40 min    Activity Tolerance Patient tolerated treatment well    Behavior During Therapy WFL for tasks assessed/performed          Past Medical History:  Diagnosis Date   Abnormal Pap smear    patient states she had cancer that was removed from her cervix   Asthma    when pregnant/ once 6 yrs ago only time asthma attack the dr said   Cancer [facility])    Clotting disorder    Constipation    GERD (gastroesophageal reflux disease) 10/19/2015   Gestational diabetes    H. pylori infection    Helicobacter pylori (H. pylori) infection 09/13/2023   Hemorrhoids    History of gestational diabetes    Hx of pulmonary embolus during pregnancy 11/09/2014   July 2016: CTA notable for pulmonary embolus to the posterior right  lower lobe. Provoked given pregnancy.     Coumadin  until 02/09/15.      Intramural leiomyoma of uterus 02/29/2016   Pulmonary embolism (HCC)    Renal insufficiency    TMJ (sprain of temporomandibular joint), initial encounter 12/29/2020   Vitamin D  deficiency    Vitamin D  deficiency    Past Surgical History:  Procedure Laterality Date   CHOLECYSTECTOMY     Patient Active Problem List   Diagnosis Date Noted   Neuropathy of right lateral plantar nerve 01/03/2024   Plantar fasciitis 12/05/2023   Well woman exam with routine gynecological exam 11/02/2023   Polyarthralgia 08/08/2023   Anxiety disorder due to general medical condition with panic attack 01/31/2020    Cerebral aneurysm without rupture 01/31/2020   Intramural leiomyoma of uterus 02/29/2016   History of pulmonary embolism 02/24/2015    PCP: [name], MD  REFERRING PROVIDER:  Valdemar Rocky SAUNDERS, NP     REFERRING DIAG: Chronic left shoulder pain [M25.512, G89.29]   THERAPY DIAG:  Left shoulder pain, unspecified chronicity  Muscle weakness (generalized)  Rationale for Evaluation and Treatment: Rehabilitation  ONSET DATE: 1 month  SUBJECTIVE:  SUBJECTIVE STATEMENT: In-person interpreter was present throughout today's session.   Patient reports some shoulder pain today, she states that household activities like sweeping and mopping bother her shoulder.   EVAL: Patient reports that her L shoulder began with a mild pain for a few days. Then, one day it hurt so bad that I couldn't really move it. The day before she was lifting some cases of water. She states that it has been about 3 weeks to 1 months.    Hand dominance: Right  PERTINENT HISTORY: Relevant PMHx includes Pulmonary embolism, Renal insufficiency, Polyarthralgia, cerebral aneurysm   PAIN:  Are you having pain?  Yes: NPRS scale: 3/10 current, 10/10 worst  Pain location: L shoulder (anterior)  Pain description: popping/clicking,  Aggravating factors: Upper body dressing, lifting heavy things, sleeping on L side Relieving factors: I don't know, also taking pain medicine -- unsure if it's helping   PRECAUTIONS: None  RED FLAGS: None   WEIGHT BEARING RESTRICTIONS: No  FALLS:  Has patient fallen in last 6 months? No  However, she did have a fall about 1 year ago, when she hit R side of head, but doesn't remember the details of how she fell  LIVING ENVIRONMENT: Lives with: lives with their family and lives with their  spouse Lives in: House/apartment  OCCUPATION: [name]   PLOF: Independent  PATIENT GOALS:to have less pain, to be able to move my left arm normally   NEXT MD VISIT: 02/09/2024 Neurology, 03/05/2024 PCP  OBJECTIVE:  Note: Objective measures were completed at Evaluation unless otherwise noted.  DIAGNOSTIC FINDINGS:  01/18/2024: IMPRESSION: Mild elevation of the LEFT clavicle relative to the acromion is suspicious for [facility] joint separation of uncertain chronicity.  PATIENT SURVEYS:  Quick Dash:  QUICK DASH  Please rate your ability do the following activities in the last week by selecting the number below the appropriate response.   Activities Rating  Open a tight or new jar.  4 = Severe difficulty  Do heavy household chores (e.g., wash walls, floors). 4 = Severe difficulty  Carry a shopping bag or briefcase 5 = Unable  Wash your back. 4 = Severe difficulty  Use a knife to cut food. 4 = Severe difficulty  Recreational activities in which you take some force or impact through your arm, shoulder or hand (e.g., golf, hammering, tennis, etc.). 5 = Unable  During the past week, to what extent has your arm, shoulder or hand problem interfered with your normal social activities with family, friends, neighbors or groups?  3 = Moderately  During the past week, were you limited in your work or other regular daily activities as a result of your arm, shoulder or hand problem? 3 = Moderately limited  Rate the severity of the following symptoms in the last week: Arm, Shoulder, or hand pain. 3 = Moderate  Rate the severity of the following symptoms in the last week: Tingling (pins and needles) in your arm, shoulder or hand. 2 = Mild  During the past week, how much difficulty have you had sleeping because of the pain in your arm, shoulder or hand?  4 = Severe difficulty   (A QuickDASH score may not be calculated if there is greater than 1 missing item.)  Quick Dash Disability/Symptom Score:  68.2  Minimally Clinically Important Difference (MCID): 15-20 points  (Franchignoni, F. et al. (2013). Minimally clinically important difference of the disabilities of the arm, shoulder, and hand outcome measures (DASH) and its shortened version (Quick DASH). Journal  of Orthopaedic & Sports Physical Therapy, 44(1), 30-39)   COGNITION: Overall cognitive status: Within functional limits for tasks assessed     SENSATION: Not tested   UPPER EXTREMITY ROM:   Active ROM Right eval Left eval  Shoulder flexion  146  Shoulder extension    Shoulder abduction  90  Shoulder adduction    Shoulder internal rotation  WFL  Shoulder external rotation  WFL   Elbow flexion    Elbow extension    Wrist flexion    Wrist extension    Wrist ulnar deviation    Wrist radial deviation    Wrist pronation    Wrist supination    (Blank rows = not tested)  UPPER EXTREMITY MMT:  MMT Right eval Left eval  Shoulder flexion 5 3+  Shoulder extension    Shoulder abduction 5 3+  Shoulder adduction    Shoulder internal rotation  4-  Shoulder external rotation  3+  Middle trapezius    Lower trapezius    Elbow flexion  4  Elbow extension  4  Wrist flexion    Wrist extension    Wrist ulnar deviation    Wrist radial deviation    Wrist pronation    Wrist supination    Grip strength (lbs)    (Blank rows = not tested)                                                                                                                              TREATMENT DATE:  [facility] Adult PT Treatment:                                                DATE: 05/01/24 Therapeutic Exercise: UBE fwd/bwd 3 min ea level 2 Supine chest press with dowel 2x10 Supine ER with dowel 2x10 Supine shoulder flexion with dowel 2x10 Seated ITYs no weight x10 ea (improved ROM on T today) Neuromuscular re-ed: Rows RTB 2x10 Shoulder extension YTB 2x10 Seated Horizontal Abduction YTB ~90 small range x10, x9 Seated BIL Shoulder ER 2 x  10  YTB  [facility] Adult PT Treatment:                                                DATE: 04/29/24 Therapeutic Exercise: UBE fwd/bwd 3 min ea level 1 Rows YTB 2x10 Shoulder extension YTB 2x10 Seated Horizontal Abduction YTB <90 small range x10 Seated BIL Shoulder ER 2 x 10  YTB Supine chest press with dowel 2x10 Supine ER with dowel 2x10 Supine shoulder flexion with dowel 2x10 Seated ITYs no weight x10 ea (improved ROM on T today) Seated OH press with dowel x10 (improved range today) Manual Therapy: STM  along L periscapular, UT, L CS mm    [facility] Adult PT Treatment:                                                DATE: 04/25/24 Therapeutic Exercise: UBE fwd/bwd 3 min ea level 1 Seated Horizontal Abduction YTB (too painful today) Seated BIL Shoulder ER 2 x 10  YTB Supine chest press with dowel 2x10 Supine shoulder flexion with dowel 2x10 Seated ITYs no weight x10 ea (smaller ROM on T d/t pain) Seated OH press with dowel x10 (small range) Sidelying ER (very painful, sharp, D/C) Manual Therapy: STM along L periscapular, UT, L CS mm     PATIENT EDUCATION: Education details: reviewed initial home exercise program; discussion of POC, prognosis and goals for skilled PT   Person educated: Patient Education method: Explanation, Demonstration, and Handouts Education comprehension: verbalized understanding, returned demonstration, and needs further education  HOME EXERCISE PROGRAM: Access Code: [email or web address] URL: [email or web address] Date: 04/04/2024 Prepared by: Marko Molt  Exercises - Scaption Wall Slide with Towel  - 1 x daily - 2 sets - 10 reps - 3 sec hold - Isometric Shoulder Flexion at Wall  - 1 x daily - 1 sets - 10-15 reps - 5 sec hold - Isometric Shoulder Abduction at Wall  - 1 x daily - 1 sets - 10-15 reps - 5 sec hold - Isometric Shoulder Extension at Wall  - 1 x daily - 1 sets - 10-15 reps - 5 sec hold - Isometric Shoulder Adduction  - 1 x daily - 1 sets -  10-15 reps - 5 sec hold - Supine Cervical Retraction with Towel  - 2 x daily - 2 sets - 10 reps - 3 sec hold - Seated Shoulder Shrugs  - 2 x daily - 2 sets - 10 reps - 3 sec hold  ASSESSMENT:  CLINICAL IMPRESSION: Patient presents to PT reporting her shoulder feels ok today, she did some mopping for the first time in a while yesterday and did have some pain from this. She was able to complete exercises with increased resistance and in a larger range of motion than previous session. She completed exercises today without need for manual therapy and no increase in pain throughout. Patient continues to benefit from skilled PT services and should be progressed as able to improve functional independence.   EVAL: Kim [name] is a 54 y.o. female who was seen today for physical therapy evaluation and treatment for L shoulder pain with mobility deficits, with related AC separation. She is demonstrating diminished L UE MMT scores, and pain with overhead reaching. She has related pain and difficulty with upper body dressing, heavy lifting, and diminished sleep quality d/t pain when laying on L side.. She requires skilled PT services at this time to address relevant deficits and improve overall function.     OBJECTIVE IMPAIRMENTS: decreased activity tolerance, decreased knowledge of condition, decreased ROM, decreased strength, impaired UE functional use, and pain.   ACTIVITY LIMITATIONS: carrying, lifting, sleeping, bathing, dressing, reach over head, and hygiene/grooming  PARTICIPATION LIMITATIONS: meal prep, cleaning, laundry, interpersonal relationship, driving, shopping, and community activity  PERSONAL FACTORS: 3+ comorbidities: Relevant PMHx includes Pulmonary embolism, Renal insufficiency, Polyarthralgia, cerebral aneurysm  are also affecting patient's functional outcome.   REHAB POTENTIAL: Good  CLINICAL DECISION MAKING: Stable/uncomplicated  EVALUATION COMPLEXITY: Low   GOALS: Goals reviewed  with patient? YES  SHORT TERM GOALS: Target date: 02/29/2024   Patient will be independent with initial home program at least 3 days/week.  Baseline: provided at eval Goal Status: MET Pt reports adherence 03/05/24    2.  Patient will demonstrate 10-15 degree improvement in L shoulder elevation. Baseline: 146deg shoulder flexion, 90deg shoulder abduction  03/26/24: 165deg shoulder flexion, 90 deg shoulder abduction in seated  Goal status: Ongoing   LONG TERM GOALS: Target date: 05/07/2024, last updated 03/26/2024   Patient will report improved overall functional ability with QuickDASH score of 30 or less.  Baseline: 68.2 03/26/24: 31.8 Goal Status: nearly met    2.  Patient will demonstrate improved L shoulder MMT scores of 4+/5 or greater.  Baseline: see objective measures  Goal status: ongoing, limited by pain   3.  Patient will report ability to perform all ADLs, including Upper Body Dressing without pain or difficulty  Baseline: moderately limited by pain and ROM deficits Goal status: ongoing   4.  Patient will demonstrate ability to perform floor to waist lifting of at least 25# using appropriate body mechanics and with no more than minimal pain in order to safely perform normal daily/occupational tasks.   Goal Status:ongoing; limited by pain   5.  Patient will demonstrate ability to perform overhead lifting of at least 5# using appropriate body mechanics and with no more than minimal pain in order to safely perform normal daily/occupational tasks.   Goal Status: ongoing, limited by pain      PLAN:  PT FREQUENCY: 1-2x/week  PT DURATION: 6 weeks  PLANNED INTERVENTIONS: 97164- PT Re-evaluation, 97110-Therapeutic exercises, 97530- Therapeutic activity, 97112- Neuromuscular re-education, 97535- Self Care, [id number]- Manual therapy, [id number]- Electrical stimulation (unattended), 20560 (1-2 muscles), 20561 (3+ muscles)- Dry Needling, Patient/Family education, Taping, Joint  mobilization, Spinal mobilization, Cryotherapy, and Moist heat  PLAN FOR NEXT SESSION: review and updated HEP as indicated, address L shoulder/periscapular stabilization, patient education regarding management of symptoms, otherwise progress towards established rehab goals as tolerated   Corean Pouch PTA  05/01/2024 11:25 AM   "

## 2024-05-06 ENCOUNTER — Ambulatory Visit: Admitting: Physical Therapy

## 2024-05-06 ENCOUNTER — Encounter: Payer: Self-pay | Admitting: Physical Therapy

## 2024-05-06 DIAGNOSIS — M25512 Pain in left shoulder: Secondary | ICD-10-CM | POA: Diagnosis not present

## 2024-05-06 DIAGNOSIS — M6281 Muscle weakness (generalized): Secondary | ICD-10-CM

## 2024-05-06 NOTE — Therapy (Signed)
 " OUTPATIENT PHYSICAL THERAPY NOTE/RECERTIFICATION   Patient Name: Kim [name] MRN: [id number] DOB:[birth year], 54 y.o., female Today's Date: 05/06/2024  END OF SESSION:  PT End of Session - 05/06/24 0834     Visit Number 14    Number of Visits 23    Date for Recertification  06/17/24    Authorization Type [employer]    Authorization Time Period --    Progress Note Due on Visit 24    PT Start Time 0832    PT Stop Time 0915    PT Time Calculation (min) 43 min    Activity Tolerance Patient tolerated treatment well    Behavior During Therapy [facility] for tasks assessed/performed           Past Medical History:  Diagnosis Date   Abnormal Pap smear    patient states she had cancer that was removed from her cervix   Asthma    when pregnant/ once 6 yrs ago only time asthma attack the dr said   Cancer [facility])    Clotting disorder    Constipation    GERD (gastroesophageal reflux disease) 10/19/2015   Gestational diabetes    H. pylori infection    Helicobacter pylori (H. pylori) infection 09/13/2023   Hemorrhoids    History of gestational diabetes    Hx of pulmonary embolus during pregnancy 11/09/2014   July 2016: CTA notable for pulmonary embolus to the posterior right  lower lobe. Provoked given pregnancy.     Coumadin  until 02/09/15.      Intramural leiomyoma of uterus 02/29/2016   Pulmonary embolism (HCC)    Renal insufficiency    TMJ (sprain of temporomandibular joint), initial encounter 12/29/2020   Vitamin D  deficiency    Vitamin D  deficiency    Past Surgical History:  Procedure Laterality Date   CHOLECYSTECTOMY     Patient Active Problem List   Diagnosis Date Noted   Neuropathy of right lateral plantar nerve 01/03/2024   Plantar fasciitis 12/05/2023   Well woman exam with routine gynecological exam 11/02/2023   Polyarthralgia 08/08/2023   Anxiety disorder due to general medical condition with panic attack 01/31/2020   Cerebral aneurysm without rupture  01/31/2020   Intramural leiomyoma of uterus 02/29/2016   History of pulmonary embolism 02/24/2015    PCP: [name], MD  REFERRING PROVIDER:  Valdemar Rocky SAUNDERS, NP     REFERRING DIAG: Chronic left shoulder pain [M25.512, G89.29]   THERAPY DIAG:  Left shoulder pain, unspecified chronicity  Muscle weakness (generalized)  Rationale for Evaluation and Treatment: Rehabilitation  ONSET DATE: 1 month  SUBJECTIVE:  SUBJECTIVE STATEMENT: In-person interpreter was present throughout today's session.   Pt states that she has been doing well with her PT POC related to left shoulder. Pt states that symptoms range in intensity from 2/10-6/10 depending on activity or position. Pt unable to sleep on L side due to symptoms. Symptoms have been overall improving, with quick movements behind her back she noted a return of symptoms, this occurred 3 weeks ago, has been since improving. When symptoms increase with movements like this, they last a few mins before returning to baseline.   EVAL: Patient reports that her L shoulder began with a mild pain for a few days. Then, one day it hurt so bad that I couldn't really move it. The day before she was lifting some cases of water. She states that it has been about 3 weeks to 1 months.    Hand dominance: Right  PERTINENT HISTORY: Relevant PMHx includes Pulmonary embolism, Renal insufficiency, Polyarthralgia, cerebral aneurysm   PAIN:  Are you having pain?  Yes: NPRS scale: 2/10 current, 6/10 worst  Pain location: L shoulder (anterior)  Pain description: popping/clicking,  Aggravating factors: Upper body dressing, lifting heavy things, sleeping on L side Relieving factors: I don't know, also taking pain medicine -- unsure if it's helping   PRECAUTIONS: None  RED  FLAGS: None   WEIGHT BEARING RESTRICTIONS: No  FALLS:  Has patient fallen in last 6 months? No  However, she did have a fall about 1 year ago, when she hit R side of head, but doesn't remember the details of how she fell  LIVING ENVIRONMENT: Lives with: lives with their family and lives with their spouse Lives in: House/apartment  OCCUPATION: [name]   PLOF: Independent  PATIENT GOALS:to have less pain, to be able to move my left arm normally   NEXT MD VISIT: 09/05/24-Neuro  OBJECTIVE:  Note: Objective measures were completed at Evaluation unless otherwise noted.  DIAGNOSTIC FINDINGS:  01/18/2024: IMPRESSION: Mild elevation of the LEFT clavicle relative to the acromion is suspicious for [facility] joint separation of uncertain chronicity.  PATIENT SURVEYS:  Quick Dash:  QUICK DASH  Please rate your ability do the following activities in the last week by selecting the number below the appropriate response.   Activities Rating 05/06/24  Open a tight or new jar.  4 = Severe difficulty 2  Do heavy household chores (e.g., wash walls, floors). 4 = Severe difficulty 4  Carry a shopping bag or briefcase 5 = Unable 4  Wash your back. 4 = Severe difficulty 4  Use a knife to cut food. 4 = Severe difficulty 1  Recreational activities in which you take some force or impact through your arm, shoulder or hand (e.g., golf, hammering, tennis, etc.). 5 = Unable 4  During the past week, to what extent has your arm, shoulder or hand problem interfered with your normal social activities with family, friends, neighbors or groups?  3 = Moderately 2  During the past week, were you limited in your work or other regular daily activities as a result of your arm, shoulder or hand problem? 3 = Moderately limited 2  Rate the severity of the following symptoms in the last week: Arm, Shoulder, or hand pain. 3 = Moderate 4  Rate the severity of the following symptoms in the last week: Tingling (pins and needles) in  your arm, shoulder or hand. 2 = Mild 1  During the past week, how much difficulty have you had sleeping because of the  pain in your arm, shoulder or hand?  4 = Severe difficulty 3   (A QuickDASH score may not be calculated if there is greater than 1 missing item.)  Quick Dash Disability/Symptom Score: 68.2 05/06/24: 45.5%  Minimally Clinically Important Difference (MCID): 15-20 points  Flavio, F. et al. (2013). Minimally clinically important difference of the disabilities of the arm, shoulder, and hand outcome measures (DASH) and its shortened version (Quick DASH). Journal of Orthopaedic & Sports Physical Therapy, 44(1), 30-39)   COGNITION: Overall cognitive status: Within functional limits for tasks assessed     SENSATION: Not tested   UPPER EXTREMITY ROM:   Active ROM Right eval Left eval 05/06/24 Left A/PROM  Shoulder flexion  146 130/170  Shoulder extension     Shoulder abduction  90 90/100  Shoulder adduction     Shoulder internal rotation  [facility] WFL Pain  Shoulder external rotation  [facility]  WFL Pain  Elbow flexion     Elbow extension     Wrist flexion     Wrist extension     Wrist ulnar deviation     Wrist radial deviation     Wrist pronation     Wrist supination     (Blank rows = not tested)  UPPER EXTREMITY MMT:  MMT Right eval Left eval 05/06/24 Left  Shoulder flexion 5 3+ 4-/5 P  Shoulder extension   4/5  Shoulder abduction 5 3+ 3+/5 P  Shoulder adduction     Shoulder internal rotation  4- 4/5 P  Shoulder external rotation  3+ 4-/5 P  Middle trapezius     Lower trapezius     Elbow flexion  4   Elbow extension  4   Wrist flexion     Wrist extension     Wrist ulnar deviation     Wrist radial deviation     Wrist pronation     Wrist supination     Grip strength (lbs)     (Blank rows = not tested)                                                                                                                              TREATMENT DATE:  [facility]  Adult PT Treatment:                                                DATE: 05/06/24  Therapeutic Activity: PT POC reviewed and discussed, goals assessed and updated to reflect current status, tests and measures completed.     [facility] Adult PT Treatment:  DATE: 05/01/24 Therapeutic Exercise: UBE fwd/bwd 3 min ea level 2 Supine chest press with dowel 2x10 Supine ER with dowel 2x10 Supine shoulder flexion with dowel 2x10 Seated ITYs no weight x10 ea (improved ROM on T today) Neuromuscular re-ed: Rows RTB 2x10 Shoulder extension YTB 2x10 Seated Horizontal Abduction YTB ~90 small range x10, x9 Seated BIL Shoulder ER 2 x 10  YTB  [facility] Adult PT Treatment:                                                DATE: 04/29/24 Therapeutic Exercise: UBE fwd/bwd 3 min ea level 1 Rows YTB 2x10 Shoulder extension YTB 2x10 Seated Horizontal Abduction YTB <90 small range x10 Seated BIL Shoulder ER 2 x 10  YTB Supine chest press with dowel 2x10 Supine ER with dowel 2x10 Supine shoulder flexion with dowel 2x10 Seated ITYs no weight x10 ea (improved ROM on T today) Seated OH press with dowel x10 (improved range today) Manual Therapy: STM along L periscapular, UT, L CS mm    [facility] Adult PT Treatment:                                                DATE: 04/25/24 Therapeutic Exercise: UBE fwd/bwd 3 min ea level 1 Seated Horizontal Abduction YTB (too painful today) Seated BIL Shoulder ER 2 x 10  YTB Supine chest press with dowel 2x10 Supine shoulder flexion with dowel 2x10 Seated ITYs no weight x10 ea (smaller ROM on T d/t pain) Seated OH press with dowel x10 (small range) Sidelying ER (very painful, sharp, D/C) Manual Therapy: STM along L periscapular, UT, L CS mm     PATIENT EDUCATION: Education details: reviewed initial home exercise program; discussion of POC, prognosis and goals for skilled PT   Person educated: Patient Education method: Explanation,  Demonstration, and Handouts Education comprehension: verbalized understanding, returned demonstration, and needs further education  HOME EXERCISE PROGRAM: Access Code: [email or web address] URL: [email or web address] Date: 05/06/2024 Prepared by: Stann Ohara  Exercises - Isometric Shoulder Abduction at Wall  - 1 x daily - 4 x weekly - 1 sets - 10 reps - 10 sec hold - Isometric Shoulder Extension at Wall  - 1 x daily - 4 x weekly - 1 sets - 10 reps - 10 sec hold - Supine Cervical Retraction with Towel  - 2 x daily - 2 sets - 10 reps - 3 sec hold - Seated Shoulder Shrugs  - 2 x daily - 1 sets - 30 reps - 3 sec hold - Shoulder Flexion Overhead with Dowel  - 1 x daily - 7 x weekly - 3 sets - 10-15 reps - 2 hold - Standing Shoulder Abduction AAROM with Dowel  - 1 x daily - 7 x weekly - 3 sets - 10-15 reps - 2 hold  ASSESSMENT:  CLINICAL IMPRESSION: Pt has been responding well to her PT POC and is overall improving since initial evaluation. She reports inc pain with quick movements, especially in the frontal and transverse plane. When ROM tested, pt has improved tolerance to PROM and significantly less pain with greater ROM, indicative of some muscle guarding or abnormal mechanics when actively completed. There is noted structural deficit on imaging which likely  contributes to the inc pain and difficulty with Abd and rotational movements of the left shoulder. Improved objective measures noted and pt progressing towards goals. Pt stands to benefit from continued skilled physical therapy to address deficit areas and restore safety with activities and participations at home and in the community.    EVAL: Kim [name] is a 54 y.o. female who was seen today for physical therapy evaluation and treatment for L shoulder pain with mobility deficits, with related AC separation. She is demonstrating diminished L UE MMT scores, and pain with overhead reaching. She has related pain and difficulty with upper body  dressing, heavy lifting, and diminished sleep quality d/t pain when laying on L side.. She requires skilled PT services at this time to address relevant deficits and improve overall function.     OBJECTIVE IMPAIRMENTS: decreased activity tolerance, decreased knowledge of condition, decreased ROM, decreased strength, impaired UE functional use, and pain.   ACTIVITY LIMITATIONS: carrying, lifting, sleeping, bathing, dressing, reach over head, and hygiene/grooming  PARTICIPATION LIMITATIONS: meal prep, cleaning, laundry, interpersonal relationship, driving, shopping, and community activity  PERSONAL FACTORS: 3+ comorbidities: Relevant PMHx includes Pulmonary embolism, Renal insufficiency, Polyarthralgia, cerebral aneurysm  are also affecting patient's functional outcome.   REHAB POTENTIAL: Good  CLINICAL DECISION MAKING: Stable/uncomplicated  EVALUATION COMPLEXITY: Low   GOALS: Goals reviewed with patient? YES  SHORT TERM GOALS: Target date: 05/27/24   Patient will be independent with initial home program at least 3 days/week.  Baseline: provided at eval Goal Status: MET Pt reports adherence 03/05/24 and 05/06/24   2.  Patient will demonstrate 10-15 degree improvement in L shoulder elevation. Baseline: 146deg shoulder flexion, 90deg shoulder abduction  03/26/24: 165deg shoulder flexion, 90 deg shoulder abduction in seated  05/06/24: improved PROM, AROM remains limited Goal status: Ongoing   LONG TERM GOALS: Target date: 06/17/24   Patient will report improved overall functional ability with QuickDASH score of 30 or less.  Baseline: 68.2 03/26/24: 31.8 05/06/24: 45.5 Goal Status: IN PROGRESS   2.  Patient will demonstrate improved L shoulder MMT scores of 4+/5 or greater.  Baseline: see objective measures  05/06/24: see chart Goal status: ongoing, limited by pain   3.  Patient will report ability to perform all ADLs, including Upper Body Dressing without pain or difficulty   Baseline: moderately limited by pain and ROM deficits 05/06/24: able to complete with slow, careful movements, limited by pain with quick movements or with movements in the frontal/transverse plane Goal status: ongoing   4.  Patient will demonstrate ability to perform floor to waist lifting of at least 25# using appropriate body mechanics and with no more than minimal pain in order to safely perform normal daily/occupational tasks.   05/06/24: able to complete, limited by pain  Goal Status:ongoing; limited by pain   5.  Patient will demonstrate ability to perform overhead lifting of at least 5# using appropriate body mechanics and with no more than minimal pain in order to safely perform normal daily/occupational tasks.   05/06/24: 3# able to complete through functional range, but limited by pain  Goal Status: ongoing, limited by pain      PLAN:  PT FREQUENCY: 1-2x/week  PT DURATION: 6 weeks  PLANNED INTERVENTIONS: 97164- PT Re-evaluation, 97110-Therapeutic exercises, 97530- Therapeutic activity, 97112- Neuromuscular re-education, 97535- Self Care, [id number]- Manual therapy, [id number]- Electrical stimulation (unattended), 20560 (1-2 muscles), 20561 (3+ muscles)- Dry Needling, Patient/Family education, Taping, Joint mobilization, Spinal mobilization, Cryotherapy, and Moist heat  PLAN FOR NEXT SESSION: review and  updated HEP as indicated, address L shoulder/periscapular stabilization and mechanics, soft tissue mobilization, patient education regarding management of symptoms, otherwise progress towards established rehab goals as tolerated   Stann DELENA Ohara PT  05/06/2024 10:13 AM   "

## 2024-05-08 ENCOUNTER — Ambulatory Visit

## 2024-05-08 DIAGNOSIS — M6281 Muscle weakness (generalized): Secondary | ICD-10-CM

## 2024-05-08 DIAGNOSIS — M25512 Pain in left shoulder: Secondary | ICD-10-CM

## 2024-05-08 DIAGNOSIS — M5459 Other low back pain: Secondary | ICD-10-CM

## 2024-05-08 NOTE — Therapy (Addendum)
 " OUTPATIENT PHYSICAL THERAPY NOTE DISCHARGE SUMMARY   Patient Name: [facility] Everitt Elder MRN: [id number] DOB:[birth year], 54 y.o., female Today's Date: 05/08/2024   PHYSICAL THERAPY DISCHARGE SUMMARY  Visits from Start of Care: 15  Patient agrees to discharge. Patient goals were not met. Patient is being discharged due to financial reasons. Pt has been improving since initial evaluation. She continues to have ROM limitation with pain >90 degrees of elevation. She would benefit from additional PT, however d/t insurance authorization denial, plan is to discharge at this time. Patient will need new MD referral for future OP PT evaluation and insurance authorization request.    Marko Molt, PT, DPT  05/23/2024 9:40 AM    END OF SESSION:  PT End of Session - 05/08/24 1047     Visit Number 15    Number of Visits 23    Date for Recertification  06/17/24    Authorization Type [employer]    Authorization Time Period Pending re-auth    Progress Note Due on Visit 24    PT Start Time 1046    PT Stop Time 1126    PT Time Calculation (min) 40 min    Activity Tolerance Patient tolerated treatment well    Behavior During Therapy WFL for tasks assessed/performed         Past Medical History:  Diagnosis Date   Abnormal Pap smear    patient states she had cancer that was removed from her cervix   Asthma    when pregnant/ once 6 yrs ago only time asthma attack the dr said   Cancer [facility])    Clotting disorder    Constipation    GERD (gastroesophageal reflux disease) 10/19/2015   Gestational diabetes    H. pylori infection    Helicobacter pylori (H. pylori) infection 09/13/2023   Hemorrhoids    History of gestational diabetes    Hx of pulmonary embolus during pregnancy 11/09/2014   July 2016: CTA notable for pulmonary embolus to the posterior right  lower lobe. Provoked given pregnancy.     Coumadin  until 02/09/15.      Intramural leiomyoma of uterus 02/29/2016   Pulmonary  embolism (HCC)    Renal insufficiency    TMJ (sprain of temporomandibular joint), initial encounter 12/29/2020   Vitamin D  deficiency    Vitamin D  deficiency    Past Surgical History:  Procedure Laterality Date   CHOLECYSTECTOMY     Patient Active Problem List   Diagnosis Date Noted   Neuropathy of right lateral plantar nerve 01/03/2024   Plantar fasciitis 12/05/2023   Well woman exam with routine gynecological exam 11/02/2023   Polyarthralgia 08/08/2023   Anxiety disorder due to general medical condition with panic attack 01/31/2020   Cerebral aneurysm without rupture 01/31/2020   Intramural leiomyoma of uterus 02/29/2016   History of pulmonary embolism 02/24/2015    PCP: [name], MD  REFERRING PROVIDER:  Valdemar Rocky SAUNDERS, NP     REFERRING DIAG: Chronic left shoulder pain [M25.512, G89.29]   THERAPY DIAG:  Left shoulder pain, unspecified chronicity  Muscle weakness (generalized)  Other low back pain  Rationale for Evaluation and Treatment: Rehabilitation  ONSET DATE: 1 month  SUBJECTIVE:  SUBJECTIVE STATEMENT: In-person interpreter was present throughout today's session.   Patient reports that her shoulder feels mostly ok but that sometimes the pain randomly increases, her pain is at a 7/10 today.  Pt states that she has been doing well with her PT POC related to left shoulder. Pt states that symptoms range in intensity from 2/10-6/10 depending on activity or position. Pt unable to sleep on L side due to symptoms. Symptoms have been overall improving, with quick movements behind her back she noted a return of symptoms, this occurred 3 weeks ago, has been since improving. When symptoms increase with movements like this, they last a few mins before returning to baseline.   EVAL:  Patient reports that her L shoulder began with a mild pain for a few days. Then, one day it hurt so bad that I couldn't really move it. The day before she was lifting some cases of water. She states that it has been about 3 weeks to 1 months.    Hand dominance: Right  PERTINENT HISTORY: Relevant PMHx includes Pulmonary embolism, Renal insufficiency, Polyarthralgia, cerebral aneurysm   PAIN:  Are you having pain?  Yes: NPRS scale: 2/10 current, 6/10 worst  Pain location: L shoulder (anterior)  Pain description: popping/clicking,  Aggravating factors: Upper body dressing, lifting heavy things, sleeping on L side Relieving factors: I don't know, also taking pain medicine -- unsure if it's helping   PRECAUTIONS: None  RED FLAGS: None   WEIGHT BEARING RESTRICTIONS: No  FALLS:  Has patient fallen in last 6 months? No  However, she did have a fall about 1 year ago, when she hit R side of head, but doesn't remember the details of how she fell  LIVING ENVIRONMENT: Lives with: lives with their family and lives with their spouse Lives in: House/apartment  OCCUPATION: [name]   PLOF: Independent  PATIENT GOALS:to have less pain, to be able to move my left arm normally   NEXT MD VISIT: 09/05/24-Neuro  OBJECTIVE:  Note: Objective measures were completed at Evaluation unless otherwise noted.  DIAGNOSTIC FINDINGS:  01/18/2024: IMPRESSION: Mild elevation of the LEFT clavicle relative to the acromion is suspicious for [facility] joint separation of uncertain chronicity.  PATIENT SURVEYS:  Quick Dash:  QUICK DASH  Please rate your ability do the following activities in the last week by selecting the number below the appropriate response.   Activities Rating 05/06/24  Open a tight or new jar.  4 = Severe difficulty 2  Do heavy household chores (e.g., wash walls, floors). 4 = Severe difficulty 4  Carry a shopping bag or briefcase 5 = Unable 4  Wash your back. 4 = Severe difficulty 4  Use  a knife to cut food. 4 = Severe difficulty 1  Recreational activities in which you take some force or impact through your arm, shoulder or hand (e.g., golf, hammering, tennis, etc.). 5 = Unable 4  During the past week, to what extent has your arm, shoulder or hand problem interfered with your normal social activities with family, friends, neighbors or groups?  3 = Moderately 2  During the past week, were you limited in your work or other regular daily activities as a result of your arm, shoulder or hand problem? 3 = Moderately limited 2  Rate the severity of the following symptoms in the last week: Arm, Shoulder, or hand pain. 3 = Moderate 4  Rate the severity of the following symptoms in the last week: Tingling (pins and needles) in your  arm, shoulder or hand. 2 = Mild 1  During the past week, how much difficulty have you had sleeping because of the pain in your arm, shoulder or hand?  4 = Severe difficulty 3   (A QuickDASH score may not be calculated if there is greater than 1 missing item.)  Quick Dash Disability/Symptom Score: 68.2 05/06/24: 45.5%  Minimally Clinically Important Difference (MCID): 15-20 points  Flavio, F. et al. (2013). Minimally clinically important difference of the disabilities of the arm, shoulder, and hand outcome measures (DASH) and its shortened version (Quick DASH). Journal of Orthopaedic & Sports Physical Therapy, 44(1), 30-39)   COGNITION: Overall cognitive status: Within functional limits for tasks assessed     SENSATION: Not tested   UPPER EXTREMITY ROM:   Active ROM Right eval Left eval 05/06/24 Left A/PROM  Shoulder flexion  146 130/170  Shoulder extension     Shoulder abduction  90 90/100  Shoulder adduction     Shoulder internal rotation  [facility] WFL Pain  Shoulder external rotation  [facility]  WFL Pain  Elbow flexion     Elbow extension     Wrist flexion     Wrist extension     Wrist ulnar deviation     Wrist radial deviation     Wrist  pronation     Wrist supination     (Blank rows = not tested)  UPPER EXTREMITY MMT:  MMT Right eval Left eval 05/06/24 Left  Shoulder flexion 5 3+ 4-/5 P  Shoulder extension   4/5  Shoulder abduction 5 3+ 3+/5 P  Shoulder adduction     Shoulder internal rotation  4- 4/5 P  Shoulder external rotation  3+ 4-/5 P  Middle trapezius     Lower trapezius     Elbow flexion  4   Elbow extension  4   Wrist flexion     Wrist extension     Wrist ulnar deviation     Wrist radial deviation     Wrist pronation     Wrist supination     Grip strength (lbs)     (Blank rows = not tested)                                                                                                                              TREATMENT DATE:  [facility] Adult PT Treatment:                                                DATE: 05/08/24 Therapeutic Exercise: UBE fwd x 2 min level 1 (painful) PROM all directions (pain with abduction and flexion) Manual: Grade I-II AP mobs patient in supine for pain modulation (reports decreased pain) STM to biceps, upper trap, pec IASTM to bceps, pec    [facility] Adult PT Treatment:  DATE: 05/06/24  Therapeutic Activity: PT POC reviewed and discussed, goals assessed and updated to reflect current status, tests and measures completed.     [facility] Adult PT Treatment:                                                DATE: 05/01/24 Therapeutic Exercise: UBE fwd/bwd 3 min ea level 2 Supine chest press with dowel 2x10 Supine ER with dowel 2x10 Supine shoulder flexion with dowel 2x10 Seated ITYs no weight x10 ea (improved ROM on T today) Neuromuscular re-ed: Rows RTB 2x10 Shoulder extension YTB 2x10 Seated Horizontal Abduction YTB ~90 small range x10, x9 Seated BIL Shoulder ER 2 x 10  YTB    PATIENT EDUCATION: Education details: reviewed initial home exercise program; discussion of POC, prognosis and goals for skilled PT   Person  educated: Patient Education method: Explanation, Demonstration, and Handouts Education comprehension: verbalized understanding, returned demonstration, and needs further education  HOME EXERCISE PROGRAM: Access Code: [email or web address] URL: [email or web address] Date: 05/06/2024 Prepared by: Stann Ohara  Exercises - Isometric Shoulder Abduction at Wall  - 1 x daily - 4 x weekly - 1 sets - 10 reps - 10 sec hold - Isometric Shoulder Extension at Wall  - 1 x daily - 4 x weekly - 1 sets - 10 reps - 10 sec hold - Supine Cervical Retraction with Towel  - 2 x daily - 2 sets - 10 reps - 3 sec hold - Seated Shoulder Shrugs  - 2 x daily - 1 sets - 30 reps - 3 sec hold - Shoulder Flexion Overhead with Dowel  - 1 x daily - 7 x weekly - 3 sets - 10-15 reps - 2 hold - Standing Shoulder Abduction AAROM with Dowel  - 1 x daily - 7 x weekly - 3 sets - 10-15 reps - 2 hold  ASSESSMENT:  CLINICAL IMPRESSION: Patient presents to PT reporting increased pain right now, 7/10, that she attributes to having her arm resting in abduction on a pillow this morning and felt the twinge of pain return and it has stuck around today so far. Attempted UBE today but patient found very painful so terminated early and utilized manual techniques today to decrease pain and tension. She reports decreased pain with joint mobs and increased pain with PROM into flexion and abduction. She reports overall improvement in pain levels at end of session. Will resume exercises at future sessions. Patient continues to benefit from skilled PT services and should be progressed as able to improve functional independence.   Progress note: Pt has been responding well to her PT POC and is overall improving since initial evaluation. She reports inc pain with quick movements, especially in the frontal and transverse plane. When ROM tested, pt has improved tolerance to PROM and significantly less pain with greater ROM, indicative of some muscle  guarding or abnormal mechanics when actively completed. There is noted structural deficit on imaging which likely contributes to the inc pain and difficulty with Abd and rotational movements of the left shoulder. Improved objective measures noted and pt progressing towards goals. Pt stands to benefit from continued skilled physical therapy to address deficit areas and restore safety with activities and participations at home and in the community.    EVAL: Merleen is a 54 y.o. female who was seen today for physical therapy evaluation and  treatment for L shoulder pain with mobility deficits, with related AC separation. She is demonstrating diminished L UE MMT scores, and pain with overhead reaching. She has related pain and difficulty with upper body dressing, heavy lifting, and diminished sleep quality d/t pain when laying on L side.. She requires skilled PT services at this time to address relevant deficits and improve overall function.     OBJECTIVE IMPAIRMENTS: decreased activity tolerance, decreased knowledge of condition, decreased ROM, decreased strength, impaired UE functional use, and pain.   ACTIVITY LIMITATIONS: carrying, lifting, sleeping, bathing, dressing, reach over head, and hygiene/grooming  PARTICIPATION LIMITATIONS: meal prep, cleaning, laundry, interpersonal relationship, driving, shopping, and community activity  PERSONAL FACTORS: 3+ comorbidities: Relevant PMHx includes Pulmonary embolism, Renal insufficiency, Polyarthralgia, cerebral aneurysm  are also affecting patient's functional outcome.   REHAB POTENTIAL: Good  CLINICAL DECISION MAKING: Stable/uncomplicated  EVALUATION COMPLEXITY: Low   GOALS: Goals reviewed with patient? YES  SHORT TERM GOALS: Target date: 05/27/24   Patient will be independent with initial home program at least 3 days/week.  Baseline: provided at eval Goal Status: MET Pt reports adherence 03/05/24 and 05/06/24   2.  Patient will demonstrate  10-15 degree improvement in L shoulder elevation. Baseline: 146deg shoulder flexion, 90deg shoulder abduction  03/26/24: 165deg shoulder flexion, 90 deg shoulder abduction in seated  05/06/24: improved PROM, AROM remains limited Goal status: Ongoing   LONG TERM GOALS: Target date: 06/17/24   Patient will report improved overall functional ability with QuickDASH score of 30 or less.  Baseline: 68.2 03/26/24: 31.8 05/06/24: 45.5 Goal Status: IN PROGRESS   2.  Patient will demonstrate improved L shoulder MMT scores of 4+/5 or greater.  Baseline: see objective measures  05/06/24: see chart Goal status: ongoing, limited by pain   3.  Patient will report ability to perform all ADLs, including Upper Body Dressing without pain or difficulty  Baseline: moderately limited by pain and ROM deficits 05/06/24: able to complete with slow, careful movements, limited by pain with quick movements or with movements in the frontal/transverse plane Goal status: ongoing   4.  Patient will demonstrate ability to perform floor to waist lifting of at least 25# using appropriate body mechanics and with no more than minimal pain in order to safely perform normal daily/occupational tasks.   05/06/24: able to complete, limited by pain  Goal Status:ongoing; limited by pain   5.  Patient will demonstrate ability to perform overhead lifting of at least 5# using appropriate body mechanics and with no more than minimal pain in order to safely perform normal daily/occupational tasks.   05/06/24: 3# able to complete through functional range, but limited by pain  Goal Status: ongoing, limited by pain      PLAN:  PT FREQUENCY: 1-2x/week  PT DURATION: 6 weeks  PLANNED INTERVENTIONS: [id number]- PT Re-evaluation, 97110-Therapeutic exercises, 97530- Therapeutic activity, 97112- Neuromuscular re-education, 97535- Self Care, [id number]- Manual therapy, [id number]- Electrical stimulation (unattended), 20560 (1-2 muscles), 20561 (3+ muscles)-  Dry Needling, Patient/Family education, Taping, Joint mobilization, Spinal mobilization, Cryotherapy, and Moist heat  PLAN FOR NEXT SESSION: review and updated HEP as indicated, address L shoulder/periscapular stabilization and mechanics, soft tissue mobilization, patient education regarding management of symptoms, otherwise progress towards established rehab goals as tolerated   Corean Pouch PTA  05/08/2024 11:28 AM   "

## 2024-05-13 ENCOUNTER — Ambulatory Visit

## 2024-05-17 ENCOUNTER — Ambulatory Visit

## 2024-05-20 ENCOUNTER — Ambulatory Visit

## 2024-05-24 ENCOUNTER — Ambulatory Visit

## 2024-05-27 ENCOUNTER — Ambulatory Visit

## 2024-05-31 ENCOUNTER — Ambulatory Visit

## 2024-06-03 ENCOUNTER — Ambulatory Visit

## 2024-09-05 ENCOUNTER — Ambulatory Visit: Admitting: Neurology
